# Patient Record
Sex: Male | Born: 1945 | Race: White | Hispanic: No | Marital: Married | Smoking: Never smoker
Health system: Southern US, Community
[De-identification: ages and names within clinical notes are randomized; demographics above are authoritative.]

## PROBLEM LIST (undated history)

## (undated) DIAGNOSIS — M1990 Unspecified osteoarthritis, unspecified site: Secondary | ICD-10-CM

## (undated) DIAGNOSIS — R339 Retention of urine, unspecified: Secondary | ICD-10-CM

## (undated) DIAGNOSIS — K8050 Calculus of bile duct without cholangitis or cholecystitis without obstruction: Secondary | ICD-10-CM

## (undated) DIAGNOSIS — R011 Cardiac murmur, unspecified: Secondary | ICD-10-CM

## (undated) DIAGNOSIS — G629 Polyneuropathy, unspecified: Secondary | ICD-10-CM

## (undated) DIAGNOSIS — K4090 Unilateral inguinal hernia, without obstruction or gangrene, not specified as recurrent: Secondary | ICD-10-CM

## (undated) DIAGNOSIS — I10 Essential (primary) hypertension: Secondary | ICD-10-CM

## (undated) DIAGNOSIS — M549 Dorsalgia, unspecified: Secondary | ICD-10-CM

## (undated) DIAGNOSIS — G8929 Other chronic pain: Secondary | ICD-10-CM

## (undated) DIAGNOSIS — M419 Scoliosis, unspecified: Secondary | ICD-10-CM

## (undated) DIAGNOSIS — Z9889 Other specified postprocedural states: Secondary | ICD-10-CM

## (undated) DIAGNOSIS — K819 Cholecystitis, unspecified: Secondary | ICD-10-CM

## (undated) HISTORY — DX: Calculus of bile duct without cholangitis or cholecystitis without obstruction: K80.50

## (undated) HISTORY — DX: Unilateral inguinal hernia, without obstruction or gangrene, not specified as recurrent: K40.90

## (undated) HISTORY — DX: Cholecystitis, unspecified: K81.9

## (undated) HISTORY — PX: FOOT SURGERY: SHX648

## (undated) HISTORY — PX: COLONOSCOPY: SHX174

## (undated) HISTORY — PX: TONSILLECTOMY: SUR1361

## (undated) HISTORY — DX: Cardiac murmur, unspecified: R01.1

---

## 1965-05-30 DIAGNOSIS — Z9089 Acquired absence of other organs: Secondary | ICD-10-CM

## 1965-05-30 DIAGNOSIS — E890 Postprocedural hypothyroidism: Secondary | ICD-10-CM

## 1965-05-30 HISTORY — PX: THYROIDECTOMY: SHX17

## 1965-05-30 HISTORY — DX: Acquired absence of other organs: Z90.89

## 1965-05-30 HISTORY — DX: Postprocedural hypothyroidism: E89.0

## 1973-05-30 HISTORY — PX: BACK SURGERY: SHX140

## 1998-05-27 ENCOUNTER — Encounter: Payer: Self-pay | Admitting: *Deleted

## 1998-05-27 ENCOUNTER — Ambulatory Visit (HOSPITAL_COMMUNITY): Admission: RE | Admit: 1998-05-27 | Discharge: 1998-05-27 | Payer: Self-pay | Admitting: *Deleted

## 1998-06-10 ENCOUNTER — Ambulatory Visit (HOSPITAL_COMMUNITY): Admission: RE | Admit: 1998-06-10 | Discharge: 1998-06-10 | Payer: Self-pay | Admitting: *Deleted

## 1998-06-24 ENCOUNTER — Ambulatory Visit (HOSPITAL_COMMUNITY): Admission: RE | Admit: 1998-06-24 | Discharge: 1998-06-24 | Payer: Self-pay | Admitting: *Deleted

## 1998-06-24 ENCOUNTER — Encounter: Payer: Self-pay | Admitting: *Deleted

## 1999-03-24 ENCOUNTER — Ambulatory Visit (HOSPITAL_BASED_OUTPATIENT_CLINIC_OR_DEPARTMENT_OTHER): Admission: RE | Admit: 1999-03-24 | Discharge: 1999-03-24 | Payer: Self-pay | Admitting: Orthopedic Surgery

## 2002-03-26 ENCOUNTER — Encounter: Admission: RE | Admit: 2002-03-26 | Discharge: 2002-03-26 | Payer: Self-pay | Admitting: Orthopedic Surgery

## 2002-03-26 ENCOUNTER — Encounter: Payer: Self-pay | Admitting: Orthopedic Surgery

## 2002-04-09 ENCOUNTER — Encounter: Payer: Self-pay | Admitting: Orthopedic Surgery

## 2002-04-09 ENCOUNTER — Encounter: Admission: RE | Admit: 2002-04-09 | Discharge: 2002-04-09 | Payer: Self-pay | Admitting: Orthopedic Surgery

## 2002-05-02 ENCOUNTER — Encounter: Payer: Self-pay | Admitting: Orthopedic Surgery

## 2002-05-02 ENCOUNTER — Encounter: Admission: RE | Admit: 2002-05-02 | Discharge: 2002-05-02 | Payer: Self-pay | Admitting: Orthopedic Surgery

## 2002-05-07 ENCOUNTER — Ambulatory Visit (HOSPITAL_COMMUNITY): Admission: RE | Admit: 2002-05-07 | Discharge: 2002-05-07 | Payer: Self-pay | Admitting: Gastroenterology

## 2003-05-31 HISTORY — PX: HAND SURGERY: SHX662

## 2003-09-18 ENCOUNTER — Ambulatory Visit (HOSPITAL_BASED_OUTPATIENT_CLINIC_OR_DEPARTMENT_OTHER): Admission: RE | Admit: 2003-09-18 | Discharge: 2003-09-18 | Payer: Self-pay | Admitting: Orthopedic Surgery

## 2003-09-18 ENCOUNTER — Ambulatory Visit (HOSPITAL_COMMUNITY): Admission: RE | Admit: 2003-09-18 | Discharge: 2003-09-18 | Payer: Self-pay | Admitting: Orthopedic Surgery

## 2004-12-16 ENCOUNTER — Ambulatory Visit (HOSPITAL_COMMUNITY): Admission: RE | Admit: 2004-12-16 | Discharge: 2004-12-16 | Payer: Self-pay | Admitting: Orthopedic Surgery

## 2011-03-08 DIAGNOSIS — E559 Vitamin D deficiency, unspecified: Secondary | ICD-10-CM | POA: Insufficient documentation

## 2011-05-15 ENCOUNTER — Emergency Department (HOSPITAL_COMMUNITY): Payer: Medicare Other

## 2011-05-15 ENCOUNTER — Encounter: Payer: Self-pay | Admitting: *Deleted

## 2011-05-15 ENCOUNTER — Other Ambulatory Visit: Payer: Self-pay

## 2011-05-15 ENCOUNTER — Inpatient Hospital Stay (HOSPITAL_COMMUNITY)
Admission: EM | Admit: 2011-05-15 | Discharge: 2011-05-17 | DRG: 917 | Disposition: A | Discharge status: Home / Self Care | Payer: Medicare Other | Attending: Internal Medicine | Admitting: Internal Medicine

## 2011-05-15 DIAGNOSIS — R4589 Other symptoms and signs involving emotional state: Secondary | ICD-10-CM

## 2011-05-15 DIAGNOSIS — Y92009 Unspecified place in unspecified non-institutional (private) residence as the place of occurrence of the external cause: Secondary | ICD-10-CM

## 2011-05-15 DIAGNOSIS — T50991A Poisoning by other drugs, medicaments and biological substances, accidental (unintentional), initial encounter: Principal | ICD-10-CM | POA: Diagnosis present

## 2011-05-15 DIAGNOSIS — R001 Bradycardia, unspecified: Secondary | ICD-10-CM

## 2011-05-15 DIAGNOSIS — M549 Dorsalgia, unspecified: Secondary | ICD-10-CM | POA: Diagnosis present

## 2011-05-15 DIAGNOSIS — T50901A Poisoning by unspecified drugs, medicaments and biological substances, accidental (unintentional), initial encounter: Secondary | ICD-10-CM

## 2011-05-15 DIAGNOSIS — I959 Hypotension, unspecified: Secondary | ICD-10-CM | POA: Diagnosis present

## 2011-05-15 DIAGNOSIS — G9340 Encephalopathy, unspecified: Secondary | ICD-10-CM

## 2011-05-15 DIAGNOSIS — I498 Other specified cardiac arrhythmias: Secondary | ICD-10-CM

## 2011-05-15 DIAGNOSIS — T424X1A Poisoning by benzodiazepines, accidental (unintentional), initial encounter: Secondary | ICD-10-CM

## 2011-05-15 DIAGNOSIS — F988 Other specified behavioral and emotional disorders with onset usually occurring in childhood and adolescence: Secondary | ICD-10-CM | POA: Diagnosis present

## 2011-05-15 DIAGNOSIS — G8929 Other chronic pain: Secondary | ICD-10-CM | POA: Diagnosis present

## 2011-05-15 DIAGNOSIS — T424X4A Poisoning by benzodiazepines, undetermined, initial encounter: Secondary | ICD-10-CM

## 2011-05-15 DIAGNOSIS — I10 Essential (primary) hypertension: Secondary | ICD-10-CM

## 2011-05-15 DIAGNOSIS — T50902A Poisoning by unspecified drugs, medicaments and biological substances, intentional self-harm, initial encounter: Secondary | ICD-10-CM

## 2011-05-15 HISTORY — DX: Other chronic pain: G89.29

## 2011-05-15 HISTORY — DX: Essential (primary) hypertension: I10

## 2011-05-15 HISTORY — DX: Scoliosis, unspecified: M41.9

## 2011-05-15 HISTORY — DX: Encephalopathy, unspecified: G93.40

## 2011-05-15 HISTORY — DX: Unspecified osteoarthritis, unspecified site: M19.90

## 2011-05-15 HISTORY — DX: Dorsalgia, unspecified: M54.9

## 2011-05-15 HISTORY — DX: Other specified postprocedural states: Z98.890

## 2011-05-15 LAB — ACETAMINOPHEN LEVEL: Acetaminophen (Tylenol), Serum: <15 ug/mL (ref 10–30)

## 2011-05-15 LAB — COMPREHENSIVE METABOLIC PANEL
AST: 14 U/L (ref 0–37)
AST: 14 U/L (ref 0–37)
Albumin: 3.1 g/dL — ABNORMAL LOW (ref 3.5–5.2)
BUN: 13 mg/dL (ref 6–23)
CO2: 28 mEq/L (ref 19–32)
Calcium: 9 mg/dL (ref 8.4–10.5)
Calcium: 8.2 mg/dL — ABNORMAL LOW (ref 8.4–10.5)
Chloride: 110 mEq/L (ref 96–112)
Creatinine, Ser: 1.06 mg/dL (ref 0.50–1.35)
Creatinine, Ser: 0.82 mg/dL (ref 0.50–1.35)
GFR calc non Af Amer: 72 mL/min — ABNORMAL LOW (ref >90)
Total Bilirubin: 0.3 mg/dL (ref 0.3–1.2)

## 2011-05-15 LAB — URINALYSIS, ROUTINE W REFLEX MICROSCOPIC
Bilirubin Urine: NEGATIVE
Glucose, UA: NEGATIVE mg/dL
Ketones, ur: NEGATIVE mg/dL
Protein, ur: NEGATIVE mg/dL
Urobilinogen, UA: 0.2 mg/dL (ref 0.0–1.0)

## 2011-05-15 LAB — BLOOD GAS, ARTERIAL
Acid-base deficit: 4.8 mmol/L — ABNORMAL HIGH (ref 0.0–2.0)
Drawn by: 30996
O2 Content: 2 L/min
O2 Saturation: 98.3 %
O2 Saturation: 93.8 %
Patient temperature: 98.6
TCO2: 20.2 mmol/L (ref 0–100)
pH, Arterial: 7.294 — ABNORMAL LOW (ref 7.350–7.450)
pO2, Arterial: 91.7 mmHg (ref 80.0–100.0)

## 2011-05-15 LAB — CBC
Hemoglobin: 12.9 g/dL — ABNORMAL LOW (ref 13.0–17.0)
MCH: 30.4 pg (ref 26.0–34.0)
MCH: 30.4 pg (ref 26.0–34.0)
MCV: 91.5 fL (ref 78.0–100.0)
Platelets: 212 10*3/uL (ref 150–400)
Platelets: 165 10*3/uL (ref 150–400)
RBC: 4.24 MIL/uL (ref 4.22–5.81)
RDW: 13.3 % (ref 11.5–15.5)
WBC: 7 10*3/uL (ref 4.0–10.5)
WBC: 7.1 10*3/uL (ref 4.0–10.5)

## 2011-05-15 LAB — RAPID URINE DRUG SCREEN, HOSP PERFORMED
Amphetamines: NOT DETECTED
Barbiturates: NOT DETECTED
Benzodiazepines: POSITIVE — AB
Cocaine: NOT DETECTED
Opiates: NOT DETECTED
Tetrahydrocannabinol: NOT DETECTED

## 2011-05-15 LAB — POCT I-STAT, CHEM 8
BUN: 13 mg/dL (ref 6–23)
Creatinine, Ser: 1.1 mg/dL (ref 0.50–1.35)
Glucose, Bld: 109 mg/dL — ABNORMAL HIGH (ref 70–99)
Sodium: 136 mEq/L (ref 135–145)
TCO2: 25 mmol/L (ref 0–100)

## 2011-05-15 LAB — URINE MICROSCOPIC-ADD ON

## 2011-05-15 LAB — AMYLASE: Amylase: 53 U/L (ref 0–105)

## 2011-05-15 LAB — APTT: aPTT: 32 seconds (ref 24–37)

## 2011-05-15 LAB — PROTIME-INR
INR: 1.06 (ref 0.00–1.49)
INR: 1.07 (ref 0.00–1.49)

## 2011-05-15 LAB — DIFFERENTIAL
Eosinophils Absolute: 0.1 10*3/uL (ref 0.0–0.7)
Lymphocytes Relative: 22 % (ref 12–46)
Lymphs Abs: 1.6 10*3/uL (ref 0.7–4.0)
Lymphs Abs: 1.7 10*3/uL (ref 0.7–4.0)
Monocytes Relative: 9 % (ref 3–12)
Monocytes Relative: 11 % (ref 3–12)
Neutro Abs: 4.7 10*3/uL (ref 1.7–7.7)
Neutro Abs: 4.5 10*3/uL (ref 1.7–7.7)
Neutrophils Relative %: 66 % (ref 43–77)
Neutrophils Relative %: 63 % (ref 43–77)

## 2011-05-15 LAB — LIPASE, BLOOD: Lipase: 35 U/L (ref 11–59)

## 2011-05-15 LAB — CARDIAC PANEL(CRET KIN+CKTOT+MB+TROPI)
CK, MB: 3.9 ng/mL (ref 0.3–4.0)
Total CK: 100 U/L (ref 7–232)

## 2011-05-15 LAB — PHOSPHORUS: Phosphorus: 3 mg/dL (ref 2.3–4.6)

## 2011-05-15 MED ORDER — HEPARIN SODIUM (PORCINE) 5000 UNIT/ML IJ SOLN
5000.0000 [IU] | Freq: Two times a day (BID) | INTRAMUSCULAR | Status: DC
  Administered 2011-05-15 – 2011-05-17 (×5): 5000 [IU] via SUBCUTANEOUS
  Filled 2011-05-15 (×8): qty 1

## 2011-05-15 MED ORDER — HALOPERIDOL LACTATE 5 MG/ML IJ SOLN
INTRAMUSCULAR | Status: AC
  Filled 2011-05-15: qty 1

## 2011-05-15 MED ORDER — GLUCAGON HCL (RDNA) 1 MG IJ SOLR
5.0000 mg | Freq: Once | INTRAMUSCULAR | Status: AC
  Administered 2011-05-15: 5 mg via INTRAVENOUS
  Filled 2011-05-15 (×3): qty 5
  Filled 2011-05-15: qty 1
  Filled 2011-05-15 (×2): qty 5

## 2011-05-15 MED ORDER — SODIUM CHLORIDE 0.9 % IV BOLUS (SEPSIS)
1000.0000 mL | Freq: Once | INTRAVENOUS | Status: AC
  Administered 2011-05-15: 1000 mL via INTRAVENOUS

## 2011-05-15 MED ORDER — NALOXONE HCL 1 MG/ML IJ SOLN
INTRAMUSCULAR | Status: AC
  Filled 2011-05-15: qty 2

## 2011-05-15 MED ORDER — SODIUM CHLORIDE 0.9 % IV SOLN
250.0000 mL | INTRAVENOUS | Status: DC | PRN
Start: 2011-05-15 — End: 2011-05-17

## 2011-05-15 MED ORDER — DOPAMINE-DEXTROSE 3.2-5 MG/ML-% IV SOLN
INTRAVENOUS | Status: AC
  Filled 2011-05-15: qty 250

## 2011-05-15 MED ORDER — GLUCAGON HCL (RDNA) 1 MG IJ SOLR
5.0000 mg | INTRAVENOUS | Status: DC
  Filled 2011-05-15 (×9): qty 5

## 2011-05-15 MED ORDER — DOPAMINE-DEXTROSE 3.2-5 MG/ML-% IV SOLN
2.0000 ug/kg/min | Freq: Once | INTRAVENOUS | Status: AC
  Administered 2011-05-15: 6 ug/kg/min via INTRAVENOUS

## 2011-05-15 MED ORDER — SODIUM CHLORIDE 0.9 % IV SOLN
INTRAVENOUS | Status: DC
  Administered 2011-05-15: 15:00:00 via INTRAVENOUS

## 2011-05-15 MED ORDER — ACETAMINOPHEN 500 MG PO TABS: 1000.0000 mg | ORAL_TABLET | Freq: Four times a day (QID) | ORAL | Status: DC | PRN

## 2011-05-15 MED ORDER — BIOTENE DRY MOUTH MT LIQD
15.0000 mL | Freq: Two times a day (BID) | OROMUCOSAL | Status: DC
  Administered 2011-05-15 – 2011-05-16 (×3): 15 mL via OROMUCOSAL

## 2011-05-15 MED ORDER — ONDANSETRON HCL 4 MG/2ML IJ SOLN
4.0000 mg | INTRAMUSCULAR | Status: DC | PRN
  Administered 2011-05-15: 4 mg via INTRAVENOUS
  Filled 2011-05-15: qty 2

## 2011-05-15 MED ORDER — SODIUM CHLORIDE 0.9 % IV SOLN
INTRAVENOUS | Status: DC
  Administered 2011-05-15: 11:00:00 via INTRAVENOUS
  Administered 2011-05-15: 100 mL/h via INTRAVENOUS
  Administered 2011-05-15: 11:00:00 via INTRAVENOUS

## 2011-05-15 MED ORDER — CHLORHEXIDINE GLUCONATE 0.12 % MT SOLN
15.0000 mL | Freq: Two times a day (BID) | OROMUCOSAL | Status: DC
  Administered 2011-05-15 – 2011-05-17 (×3): 15 mL via OROMUCOSAL
  Filled 2011-05-15 (×6): qty 15

## 2011-05-15 MED ORDER — HALOPERIDOL LACTATE 5 MG/ML IJ SOLN
5.0000 mg | Freq: Four times a day (QID) | INTRAMUSCULAR | Status: DC | PRN
  Administered 2011-05-15 – 2011-05-16 (×2): 5 mg via INTRAVENOUS
  Filled 2011-05-15: qty 1

## 2011-05-15 NOTE — ED Notes (Signed)
Toileting offered; pt tried but couldn't urinate; pt states he will try again later

## 2011-05-15 NOTE — Progress Notes (Signed)
Report given to Tiv[place] RN.  Pt calm an resting in bed.  Sitter at bedside.

## 2011-05-15 NOTE — Progress Notes (Signed)
Having mild agitation.  Haldol provided. Also D/C'd Foley and IVF

## 2011-05-15 NOTE — ED Notes (Signed)
Intensivist at bedside.

## 2011-05-15 NOTE — H&P (Addendum)
Patient name: Brian Ruiz Medical record number: [id number] Date of birth: [birth year] Age: 65 y.o. Gender: male PCP: No primary provider on file. Dr Margarette Canada   Date: 05/15/2011 Reason for Consult: Drug overdose  Referring Physician: Emergency room physician      Brief history Drug overdose Lines/tubes None Culture data/sepsis markers None  Antibiotics None Best practice Heparin DVT prophylaxis Protocols/consults  Events/studies Hypotension resolved in the emergency room at 3 L fluids  HPI  This is a 65 year old male with chronic pain and secondary disability but with few medical problems.     According to the emergency room physician who saw the patient around 10:30 AM today Per wife pt has had "stash" of unk and unk amt of meds "in a bathroom drawer" for unk period of time.Pt usually awakens during the night to "take some meds to go back to sleep" When she woke up this morning, pt sleeping soundly, and she assumed he took his meds per usual. Pt was more difficult to awaken than usual, told her he "took a lot of pills" She found metoprolol, xanax and zanaflex bottles were out. Called 911, EMS gave narcan en route without effect. Pt states he "took a bunch of my pills" approx 0730 this morning but could not say which or what amounts.    At the time of my evaluation at 1:30 PM. I learned from the patient that he taken around 6-9 tablets of 3 mg Xanax and 6-9  tablets Of muscle relaxant Zanaflex. He denies taking Lopressor overdose although when he came in the story was different, he was bradycardic and hypotensive and got glucagon pushes but both resolved after getting 3 L of fluid. At this point he is a little bit drowsy but easily arousable and conversant and he is feeling extremely guilty. He denies any suicidal or homicidal intentions. He admits to some crying spells at home but only more recently and feeling guilty because of today's event. He categorically states that he took  medications by mistake in order to relieve pain. Wife it is bedside also believes that the true.  At this point there is no fever, dyspnea, cough, sputum, edema, hemoptysis, nausea, vomiting, double vision, loss of consciousness arrhythmias. According to the emergency room physician Tylenol level and salicylate level were all normal.  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 He will only take Tylenol and muscle relaxants and Xanax.  Chest x-ray today suggested free air under the right hemidiaphragm but abdominal x-ray and clinical exam and history make this unlikely.  Past Medical History  Diagnosis Date  . Back pain, chronic     intolerant to narcotics, avoids NSAIDs due to vioxx related bleed,  sees pain clinic as [facility], prior eval by [facility] neuro in [place]  . Hypertension   . Arthritis   . Scoliosis   . 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 Thyroidectomy 1967    No family history on file.  Social History:  does not have a smoking history on file. He does not have any smokeless tobacco history on file. His alcohol and drug histories not on file. Nonsmoker and previous alcoholic but quit many years ago Allergies:  Allergies  Allergen Reactions  . Oxycodone  Agitation.    Medications:  Prior to Admission medications   Medication Sig Start Date End Date Taking? Authorizing Provider  acetaminophen (TYLENOL) 500 MG tablet Take 1,000 mg by mouth every 8 (eight) hours as needed. For pain.    Yes Historical Provider, MD  ALPRAZolam Prudy Feeler) 1 MG tablet Take 1 mg by mouth 3 (three) times daily.     Yes Historical Provider, MD  cholecalciferol (VITAMIN D) 400 UNITS TABS Take 400 Units by mouth daily.     Yes Historical Provider, MD  diazepam (VALIUM) 5  MG tablet Take 10 mg by mouth at bedtime as needed. For sleep.    Yes Historical Provider, MD  metoprolol tartrate (LOPRESSOR) 25 MG tablet Take 25 mg by mouth 2 (two) times daily.     Yes Historical Provider, MD  tiZANidine (ZANAFLEX) 4 MG tablet Take 4 mg by mouth 3 (three) times daily.     Yes Historical Provider, MD    Review of systems: Is as per history of present illness otherwise detail level part of his systems is negative  Pulse Rate:  [55-80] 70  (12/16 1245) Resp:  [12-20] 19  (12/16 1331) BP: (76-126)/(46-74) 118/74 mmHg (12/16 1331) SpO2:  [95 %-100 %] 99 % (12/16 1245) Weight:  [97.523 kg (215 lb)] 215 lb (97.523 kg) (12/16 1203)    Intake/Output Summary (Last 24 hours) at 05/15/11 1419 Last data filed at 05/15/11 1417  Gross per 24 hour  Intake      0 ml  Output   2000 ml  Net  -2000 ml   Physical exam BP 118/74  Pulse 70  Resp 19  Ht 6\' 3"  (1.905 m)  Wt 97.523 kg (215 lb)  BMI 26.87 kg/m2  SpO2 99%  General Appearance:   chronically unwell looking male lying in the stretcher in the emergency room . Looks dry   Head:    Normocephalic, without obvious abnormality, atraumatic  Eyes:    PERRL, conjunctiva/corneas clear, EOM's intact, fundi    benign, both eyes       Ears:    Normal TM's and external ear canals, both ears  Nose:   Nares normal, septum midline, mucosa normal, no drainage   or sinus tenderness  Throat:   Lips, mucosa, and tongue normal; teeth and gums normal. Scar of thyroidectomy present   Neck:   Supple, symmetrical, trachea midline, no adenopathy;       thyroid:  No enlargement/tenderness/nodules; no carotid   bruit or JVD  Back:     Symmetric, no curvature, ROM normal, no CVA tenderness  Lungs:     Clear to auscultation bilaterally, respirations unlabored  Chest wall:    No tenderness or deformity  Heart:    Regular rate and rhythm, S1 and S2 normal, no murmur, rub   or gallop  Abdomen:     Soft, non-tender, bowel sounds active all four  quadrants,    no masses, no organomegaly  Genitalia:    Normal male without lesion, discharge or tenderness  Rectal:    Normal tone, normal prostate, no masses or tenderness;   guaiac negative stool  Extremities:   Extremities normal, atraumatic, no cyanosis or edema  Pulses:   2+ and symmetric all extremities  Skin:   Skin color, texture, turgor normal, no rashes or lesions  Lymph nodes:   Cervical, supraclavicular, and axillary nodes normal  Neurologic:   CNII-XII intact. Normal strength, sensation and reflexes      Throughout. A little  bit drowsy but easily arousable and conversant. Projecting his airway. He appears sad       radiology   Dg Chest Port 1 View  05/15/2011  *RADIOLOGY REPORT*  Clinical Data: Rule out infiltrate.  Decreased heart rate. Hypertension.  Possible overdose.  PORTABLE CHEST - 1 VIEW  Comparison: None.  Findings: There is moderate S-shaped scoliosis of the thoracolumbar spine.  There is elevation of the right hemidiaphragm. There is right base atelectasis versus air beneath the right hemidiaphragm. Further evaluation with left lateral decubitus view of the abdomen is recommended.  Heart size is mildly enlarged.  There is perihilar opacity, raising question of mild pulmonary edema.  No definite focal consolidations or pleural effusions identified.  IMPRESSION:  1. 1.  Question of air in the right hemidiaphragm.  Further evaluation with left lateral decubitus view of the abdomen is recommended. 2.  Cardiomegaly and mild pulmonary edema.  Original Report Authenticated By: Patterson Hammersmith, M.D.   Dg Abd Portable 1v  05/15/2011  *RADIOLOGY REPORT*  Clinical Data: Follow-up after chest x-ray.  Evaluate for free intraperitoneal air.  ABDOMEN - 1 VIEW  Comparison: 05/15/2011  Findings: The patient is positioned on the left side to evaluate the possibility of free intraperitoneal air.  Streaky linear density is identified at the right lung base, consistent with  atelectasis.  There is no evidence for intraperitoneal air beneath the diaphragm.  Degenerative changes are seen in the spine.  IMPRESSION: No evidence for free intraperitoneal air.  Original Report Authenticated By: Patterson Hammersmith, M.D.    LAB RESULT Lab Results  Component Value Date   CREATININE 1.10 05/15/2011   BUN 13 05/15/2011   NA 136 05/15/2011   K 4.6 05/15/2011   CL 102 05/15/2011   CO2 28 05/15/2011   Lab Results  Component Value Date   WBC 7.0 05/15/2011   HGB 13.3 05/15/2011   HCT 39.0 05/15/2011   MCV 91.3 05/15/2011   PLT 212 05/15/2011   Lab Results  Component Value Date   ALT 13 05/15/2011   AST 14 05/15/2011   ALKPHOS 39 05/15/2011   BILITOT 0.3 05/15/2011   Lab Results  Component Value Date   INR 1.06 05/15/2011    ABG    Component Value Date/Time   PHART 7.276* 05/15/2011 1227   PCO2ART 48.1* 05/15/2011 1227   PO2ART 91.7 05/15/2011 1227   HCO3 21.7 05/15/2011 1227   TCO2 19.9 05/15/2011 1227   ACIDBASEDEF 4.8* 05/15/2011 1227   O2SAT 98.3 05/15/2011 1227   Tylenol, And SAl Level normal  Assessment and Plan  Principal Problem:  *Accidental drug overdose Active Problems:  Hypotension  Bradycardia  Encephalopathy acute  Guilty feelings  Hypertension  Chronic back pain s/p Thyroidectomy  He excellently to Xanax and muscle relaxant Zanaflex. He might have taken Lopressor as well. Currently dealing with guilt issues. He was hypotensive and bradycardic but it responded to 3 L of IV fluids. On clinical exam he looks a little bit dehydrated. ABG a few hours ago consistent with benzodiazepine overdose with migratory acidosis but currently respiratory status is stable and improved.  Nevertheless he is critically ill and would require a stepped-down bed.  Admit to step down unit Currently supportive care with plenty of IV fluids and recheck labs. Monitor closely for hypotension and respiratory distress and sepsis Once he is improved we'll  need to address pain and possibly depression issues and guilt CHECK THYROID FUNCNTION 12/17  D/w Dr Virginia Rochester - [facility]  will assume primary service on AM on 05/16/11.    The patient is critically ill with multiple organ systems failure and requires high complexity decision making for assessment and support, frequent evaluation and titration of therapies, application of advanced monitoring technologies and extensive interpretation of multiple databases. Critical Care Time devoted to patient care services described in this note is 60  minutes.     Kassaundra Hair 05/15/2011, 2:19 PM

## 2011-05-15 NOTE — ED Notes (Addendum)
EMS reports pt took a combination of Xanax and muscle relaxer due to pain. Approx 20 pills all together. Pt crying and talking on scene, lethargic upon arrival, IV 18 Left FA, CBG 105, fluids given enroute. Pt has history of chronic back pain. EMS administered 2 mg Narcan

## 2011-05-15 NOTE — ED Notes (Signed)
[id number]<BJ> Expected date:05/15/11<BR> Expected time:10:14 AM<BR> Means of arrival:Ambulance<BR> Comments:<BR> EMS Overdose

## 2011-05-15 NOTE — ED Notes (Signed)
BP 126/74  HR 88.  Dr Clarene Duke advised patient BP increasing. Dopamine and Glucagon held

## 2011-05-15 NOTE — ED Provider Notes (Signed)
History     CSN: [id number] Arrival date & time: 05/15/2011 10:19 AM   Chief Complaint  Patient presents with  . Drug Overdose   Patient is a 65 y.o. male presenting with Overdose. The history is provided by the patient, the EMS personnel and the spouse. The history is limited by the condition of the patient.  Drug Overdose  Pt was seen at 1025.   Per EMS and wife, pt with OD of unknown pills this morning.  Pt's wife states pt has had "a stash" of unknown pills in unknown amounts at home "in a bathroom drawer" for an unknown period of time.  Per pt's wife, pt usually awakens during the night to "take some meds to go back to sleep."  When she woke up this morning, pt was sleeping soundly, and she assumed he took his meds per his usual routine.  Pt's wife became concerned when she noted that pt was more difficult to awaken than usual and then told her he "took a lot of pills."  Pt's wife states she found pt's metoprolol, xanax and zanaflex bottles out.  EMS gave narcan en route without effect.  Pt himself states he "took a bunch of my pills," "maybe like 20" at approx 0730 this morning.  Pt cannot tell me which pills he took or it what amounts.  Pt also states that he "doesn't take my medicines the way I should" and "I take 2 when I should take 1, I take 4 when I should take 3."  Pt's wife states pt has hx of depression and chronic pain.    Past Medical History  Diagnosis Date  . Back pain, chronic   . Hypertension   . Arthritis     No past surgical history on file.   History  Substance Use Topics  . Smoking status: Not on file  . Smokeless tobacco: Not on file  . Alcohol Use:     Review of Systems  Unable to perform ROS: Other    Allergies  Oxycodone  Home Medications   Current Outpatient Rx  Name Route Sig Dispense Refill  . ACETAMINOPHEN 500 MG PO TABS Oral Take 1,000 mg by mouth every 8 (eight) hours as needed. For pain.     Marland Kitchen ALPRAZOLAM 1 MG PO TABS Oral Take 1 mg by  mouth 3 (three) times daily.      . CHOLECALCIFEROL 400 UNITS PO TABS Oral Take 400 Units by mouth daily.      Marland Kitchen DIAZEPAM 5 MG PO TABS Oral Take 10 mg by mouth at bedtime as needed. For sleep.     Marland Kitchen METOPROLOL TARTRATE 25 MG PO TABS Oral Take 25 mg by mouth 2 (two) times daily.      Marland Kitchen TIZANIDINE HCL 4 MG PO TABS Oral Take 4 mg by mouth 3 (three) times daily.        BP 105/65  Pulse 60  Resp 16  Ht 6\' 3"  (1.905 m)  Wt 215 lb (97.523 kg)  BMI 26.87 kg/m2  SpO2 95%  Physical Exam 1030: Physical examination:  Nursing notes reviewed; Vital signs and O2 SAT reviewed;  Constitutional: Thin, In no acute distress; Head:  Normocephalic, atraumatic; Eyes: EOMI, PERRL, No scleral icterus; ENMT: Mouth and pharynx normal, Mucous membranes dry; Neck: Supple, Full range of motion, No lymphadenopathy; Cardiovascular: Bradycardic, rate 40's. Regular rhythm. No murmur, rub, or gallop; Respiratory: Breath sounds clear & equal bilaterally, No rales, rhonchi, wheezes, or rub, Normal respiratory effort/excursion;  Chest: Nontender, Movement normal; Abdomen: Soft, Nontender, Nondistended, Normal bowel sounds; Extremities: Pulses normal, No tenderness, No edema, No calf edema or asymmetry.; Neuro: Lethargic, but awakens to name, speech slurred, moves all ext on stretcher without apparent gross focal motor deficits.  No facial droop; Skin: Color normal, Warm, Dry, no rash.    ED Course  Procedures   1030:  Pt bradycardic HR 40's and hypotensive with SBP 70's.  Pills counted (xanax, zanaflex, metoprolol); counts do not match the doses on the bottles (ie: metoprolol should have 120+ pills missing if pt is taking it as rx, but only 50+ are missing from bottle).  Pt apparently not taking any of his pills regularly.  Will continue IVF NS boluses, give IV glucagon bolus for possible b-blocker OD.  Pt holding his own airway at this time, swallowing secretions, occas talking with wife at bedside, resps without distress, Sats  99% on O2 2L N/C; will continue to monitor at this time.    1100:  IV glucagon given with good effect:  HR now 60's, SBP 100's.  Will hold dopamine for now, start glucagon gtt.  Pt's wife updated re: plan of care.  1215:  T/C to PCCM Dr. Tyson Alias, case discussed, including:  HPI, pertinent PM/SHx, VS/PE, dx testing, ED course and treatment.  Agreeable to admit.  Requests to obtain ABG.  Pt currently awake, but groggy, talking with wife and ED staff, speech slurred.  SBP continues 100-110's, HR 60's, Sats 99% on O2 2L N/C.   1315:  ABG with pH 7.28, CO2 48, O2 92.  Appears resp acidosis.  Pt continues groggy, but talks with staff and wife at bedside.  Resps without distress.  Will continue to monitor.  SBP continues 100-110's, HR 60's, Sats 99% O2 2L N/C.  Will hold glucagon gtt for now.    MDM  MDM Reviewed: nursing note, vitals and previous chart Interpretation: ECG, labs and x-ray Total time providing critical care: 75-105 minutes. This excludes time spent performing separately reportable procedures and services. Consults: critical care   CRITICAL CARE Performed by: Laray Anger Total critical care time: 90 Critical care time was exclusive of separately billable procedures and treating other patients. Critical care was necessary to treat or prevent imminent or life-threatening deterioration. Critical care was time spent personally by me on the following activities: development of treatment plan with patient and/or surrogate as well as nursing, discussions with consultants, evaluation of patient's response to treatment, examination of patient, obtaining history from patient or surrogate, ordering and performing treatments and interventions, ordering and review of laboratory studies, ordering and review of radiographic studies, pulse oximetry and re-evaluation of patient's condition.   Date: 05/15/2011  Rate: 54  Rhythm: sinus bradycardia  QRS Axis: normal  Intervals: normal  ST/T  Wave abnormalities: normal  Conduction Disutrbances:none  Narrative Interpretation:   Old EKG Reviewed: none available.   Results for orders placed during the hospital encounter of 05/15/11  COMPREHENSIVE METABOLIC PANEL      Component Value Range   Sodium 134 (*) 135 - 145 (mEq/L)   Potassium 4.6  3.5 - 5.1 (mEq/L)   Chloride 102  96 - 112 (mEq/L)   CO2 28  19 - 32 (mEq/L)   Glucose, Bld 109 (*) 70 - 99 (mg/dL)   BUN 13  6 - 23 (mg/dL)   Creatinine, Ser 1.61  0.50 - 1.35 (mg/dL)   Calcium 9.0  8.4 - 09.6 (mg/dL)   Total Protein 5.7 (*) 6.0 - 8.3 (  g/dL)   Albumin 3.3 (*) 3.5 - 5.2 (g/dL)   AST 14  0 - 37 (U/L)   ALT 13  0 - 53 (U/L)   Alkaline Phosphatase 39  39 - 117 (U/L)   Total Bilirubin 0.3  0.3 - 1.2 (mg/dL)   GFR calc non Af Amer 72 (*) >90 (mL/min)   GFR calc Af Amer 83 (*) >90 (mL/min)  CBC      Component Value Range   WBC 7.0  4.0 - 10.5 (K/uL)   RBC 4.24  4.22 - 5.81 (MIL/uL)   Hemoglobin 12.9 (*) 13.0 - 17.0 (g/dL)   HCT 57.8 (*) 46.9 - 52.0 (%)   MCV 91.3  78.0 - 100.0 (fL)   MCH 30.4  26.0 - 34.0 (pg)   MCHC 33.3  30.0 - 36.0 (g/dL)   RDW 62.9  52.8 - 41.3 (%)   Platelets 212  150 - 400 (K/uL)  DIFFERENTIAL      Component Value Range   Neutrophils Relative 66  43 - 77 (%)   Neutro Abs 4.7  1.7 - 7.7 (K/uL)   Lymphocytes Relative 22  12 - 46 (%)   Lymphs Abs 1.6  0.7 - 4.0 (K/uL)   Monocytes Relative 9  3 - 12 (%)   Monocytes Absolute 0.7  0.1 - 1.0 (K/uL)   Eosinophils Relative 2  0 - 5 (%)   Eosinophils Absolute 0.1  0.0 - 0.7 (K/uL)   Basophils Relative 1  0 - 1 (%)   Basophils Absolute 0.1  0.0 - 0.1 (K/uL)  URINE RAPID DRUG SCREEN (HOSP PERFORMED)      Component Value Range   Opiates NONE DETECTED  NONE DETECTED    Cocaine NONE DETECTED  NONE DETECTED    Benzodiazepines POSITIVE (*) NONE DETECTED    Amphetamines NONE DETECTED  NONE DETECTED    Tetrahydrocannabinol NONE DETECTED  NONE DETECTED    Barbiturates NONE DETECTED  NONE DETECTED     ETHANOL      Component Value Range   Alcohol, Ethyl (B) <11  0 - 11 (mg/dL)  URINALYSIS, ROUTINE W REFLEX MICROSCOPIC      Component Value Range   Color, Urine YELLOW  YELLOW    APPearance CLEAR  CLEAR    Specific Gravity, Urine 1.008  1.005 - 1.030    pH 7.0  5.0 - 8.0    Glucose, UA NEGATIVE  NEGATIVE (mg/dL)   Hgb urine dipstick LARGE (*) NEGATIVE    Bilirubin Urine NEGATIVE  NEGATIVE    Ketones, ur NEGATIVE  NEGATIVE (mg/dL)   Protein, ur NEGATIVE  NEGATIVE (mg/dL)   Urobilinogen, UA 0.2  0.0 - 1.0 (mg/dL)   Nitrite NEGATIVE  NEGATIVE    Leukocytes, UA TRACE (*) NEGATIVE   PROTIME-INR      Component Value Range   Prothrombin Time 14.0  11.6 - 15.2 (seconds)   INR 1.06  0.00 - 1.49   ACETAMINOPHEN LEVEL      Component Value Range   Acetaminophen (Tylenol), Serum <15.0  10 - 30 (ug/mL)  APTT      Component Value Range   aPTT 33  24 - 37 (seconds)  SALICYLATE LEVEL      Component Value Range   Salicylate Lvl <2.0 (*) 2.8 - 20.0 (mg/dL)  POCT I-STAT, CHEM 8      Component Value Range   Sodium 136  135 - 145 (mEq/L)   Potassium 4.6  3.5 - 5.1 (mEq/L)  Chloride 102  96 - 112 (mEq/L)   BUN 13  6 - 23 (mg/dL)   Creatinine, Ser 1.61  0.50 - 1.35 (mg/dL)   Glucose, Bld 096 (*) 70 - 99 (mg/dL)   Calcium, Ion 0.45  4.09 - 1.32 (mmol/L)   TCO2 25  0 - 100 (mmol/L)   Hemoglobin 13.3  13.0 - 17.0 (g/dL)   HCT 81.1  91.4 - 78.2 (%)  URINE MICROSCOPIC-ADD ON      Component Value Range   Squamous Epithelial / LPF RARE  RARE    WBC, UA 0-2  <3 (WBC/hpf)   RBC / HPF TOO NUMEROUS TO COUNT  <3 (RBC/hpf)   Bacteria, UA RARE  RARE    Dg Chest Port 1 View  05/15/2011  *RADIOLOGY REPORT*  Clinical Data: Rule out infiltrate.  Decreased heart rate. Hypertension.  Possible overdose.  PORTABLE CHEST - 1 VIEW  Comparison: None.  Findings: There is moderate S-shaped scoliosis of the thoracolumbar spine.  There is elevation of the right hemidiaphragm. There is right base atelectasis versus  air beneath the right hemidiaphragm. Further evaluation with left lateral decubitus view of the abdomen is recommended.  Heart size is mildly enlarged.  There is perihilar opacity, raising question of mild pulmonary edema.  No definite focal consolidations or pleural effusions identified.  IMPRESSION:  1. 1.  Question of air in the right hemidiaphragm.  Further evaluation with left lateral decubitus view of the abdomen is recommended. 2.  Cardiomegaly and mild pulmonary edema.  Original Report Authenticated By: Patterson Hammersmith, M.D.   Dg Abd Portable 1v  05/15/2011  *RADIOLOGY REPORT*  Clinical Data: Follow-up after chest x-ray.  Evaluate for free intraperitoneal air.  ABDOMEN - 1 VIEW  Comparison: 05/15/2011  Findings: The patient is positioned on the left side to evaluate the possibility of free intraperitoneal air.  Streaky linear density is identified at the right lung base, consistent with atelectasis.  There is no evidence for intraperitoneal air beneath the diaphragm.  Degenerative changes are seen in the spine.  IMPRESSION: No evidence for free intraperitoneal air.  Original Report Authenticated By: Patterson Hammersmith, M.D.        Rosezetta Schlatter, DO 05/16/11 2120

## 2011-05-16 ENCOUNTER — Encounter (HOSPITAL_COMMUNITY): Payer: Self-pay | Admitting: *Deleted

## 2011-05-16 DIAGNOSIS — F988 Other specified behavioral and emotional disorders with onset usually occurring in childhood and adolescence: Secondary | ICD-10-CM

## 2011-05-16 DIAGNOSIS — T50991A Poisoning by other drugs, medicaments and biological substances, accidental (unintentional), initial encounter: Principal | ICD-10-CM

## 2011-05-16 LAB — CARDIAC PANEL(CRET KIN+CKTOT+MB+TROPI)
CK, MB: 4.4 ng/mL — ABNORMAL HIGH (ref 0.3–4.0)
Relative Index: 3.1 — ABNORMAL HIGH (ref 0.0–2.5)
Relative Index: 1.7 (ref 0.0–2.5)
Total CK: 257 U/L — ABNORMAL HIGH (ref 7–232)
Troponin I: <0.3 ng/mL (ref <0.30)
Troponin I: <0.3 ng/mL (ref <0.30)

## 2011-05-16 LAB — MAGNESIUM: Magnesium: 1.8 mg/dL (ref 1.5–2.5)

## 2011-05-16 LAB — CBC
MCHC: 33.4 g/dL (ref 30.0–36.0)
MCV: 90.3 fL (ref 78.0–100.0)
Platelets: 199 10*3/uL (ref 150–400)
RDW: 13.1 % (ref 11.5–15.5)
WBC: 9.8 10*3/uL (ref 4.0–10.5)

## 2011-05-16 LAB — BASIC METABOLIC PANEL
Chloride: 106 mEq/L (ref 96–112)
Creatinine, Ser: 0.84 mg/dL (ref 0.50–1.35)
GFR calc Af Amer: >90 mL/min (ref >90)
GFR calc non Af Amer: 90 mL/min — ABNORMAL LOW (ref >90)

## 2011-05-16 LAB — T4, FREE: Free T4: 1.11 ng/dL (ref 0.80–1.80)

## 2011-05-16 MED ORDER — ARIPIPRAZOLE 2 MG PO TABS
2.0000 mg | ORAL_TABLET | Freq: Two times a day (BID) | ORAL | Status: DC
  Administered 2011-05-16 – 2011-05-17 (×3): 2 mg via ORAL
  Filled 2011-05-16 (×5): qty 1

## 2011-05-16 MED ORDER — METOPROLOL TARTRATE 12.5 MG HALF TABLET
12.5000 mg | ORAL_TABLET | Freq: Two times a day (BID) | ORAL | Status: DC
  Administered 2011-05-16 – 2011-05-17 (×2): 12.5 mg via ORAL
  Filled 2011-05-16 (×5): qty 1

## 2011-05-16 NOTE — Progress Notes (Signed)
Notified MD Cleotis Lema about patient's CK, MB and CK Total results.

## 2011-05-16 NOTE — Progress Notes (Addendum)
Met with Pt and wife, Brian Ruiz.  Pt has significant psychiatric outpt hx.  Pt was followed by Dr. 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  He has been on numerous medications, including Klonopin, Lithium, Adderall, Ritalin, Tranzene.  Pt has no inpt hospitalizations.  Pt suffers from chronic back pain and has had an increase in back pain in the past 5-6 years.  Pt has been out on disability for the past 2 years for his back pain and anxiety.  Pt has been experiencing anxiety, depression due to not working.  He has been to see his MD on Wed who have him some Risperdal.  This kept him up all night.  He notified his MD who told him to then take it in the a.m on Thurs.  He states that he went "manic" and Pt reports that he decided to give his wife all of his meds over the weekend out of disgust, as none of them were working.  Pt notified MD on Friday that he still hadn't slept.  MD gave him Valium and indicated that he could no longer assist Pt, as Pt's needs were out of his scope of care.  Pt stated that he awoke Sunday morning in pain and was desperate for relief.  He found a travel container of meds and took all of them in an attempt to ease the pain.  Pt stated that he was "frustrated and tired of hurting" and felt that if he took the meds he'd get a "good sleep", as Pt was having significant sleep pxs. Pt is adamant that this was not a suicide attempt.  He denies SI, HI, AVH, paranoia, delusions currently or by hx.  Pt is a recovered alcoholic.  His last drink was in his 50's.  He used to drink approx 3-10 drinks daily.  Pt quit smoking cigarettes in his 20's.  No legal hx  Pt currently lives with his wife of almost 46 years.  Pt has 2 adult children who live out of the home  Spoke with Pt's wife, Brian Ruiz, at bedside, who was present for CSW's interview with Pt.  Brian Ruiz stated that she doesn't feel that this was a suicide attempt.  Rather, Brian Ruiz stated that she feels  that Pt was desperate for sleep and pain relief and that he took the meds in an attempt to get some physical and emotional relief.  Brian Ruiz, LCSWA Clinical Social Work [phone]

## 2011-05-16 NOTE — Progress Notes (Addendum)
Subjective:  Patient seen and examined ,stated that he was unable to sleep at night because the bed was uncomfortable ,other than that denies any complaints. Objective: Vital signs in last 24 hours: Temp:  [97.4 F (36.3 C)-99.8 F (37.7 C)] 99.8 F (37.7 C) (12/17 0400) Pulse Rate:  [55-82] 63  (12/16 2300) Resp:  [12-22] 17  (12/16 2300) BP: (76-171)/(46-86) 104/57 mmHg (12/17 0400) SpO2:  [95 %-100 %] 98 % (12/16 2300) Weight:  [96.8 kg (213 lb 6.5 oz)-97.523 kg (215 lb)] 213 lb 6.5 oz (96.8 kg) (12/17 0500) Weight change:  Last BM Date:  (prior to admission )  Intake/Output from previous day: 12/16 0701 - 12/17 0700 In: 5180 [I.V.:5180] Out: 4400 [Urine:4400]     Physical Exam: General: Alert, awake, oriented x3, in no acute distress.Anxious  Heart: Irregular rate and rhythm, without murmurs, rubs, gallops. Lungs: Clear to auscultation bilaterally. Abdomen: Soft, nontender, nondistended, positive bowel sounds. Extremities: No clubbing cyanosis or edema with positive pedal pulses. Neuro: Grossly intact, nonfocal.    Lab Results: Results for orders placed during the hospital encounter of 05/15/11 (from the past 24 hour(s))  COMPREHENSIVE METABOLIC PANEL     Status: Abnormal   Collection Time   05/15/11 10:30 AM      Component Value Range   Sodium 134 (*) 135 - 145 (mEq/L)   Potassium 4.6  3.5 - 5.1 (mEq/L)   Chloride 102  96 - 112 (mEq/L)   CO2 28  19 - 32 (mEq/L)   Glucose, Bld 109 (*) 70 - 99 (mg/dL)   BUN 13  6 - 23 (mg/dL)   Creatinine, Ser 8.11  0.50 - 1.35 (mg/dL)   Calcium 9.0  8.4 - 91.4 (mg/dL)   Total Protein 5.7 (*) 6.0 - 8.3 (g/dL)   Albumin 3.3 (*) 3.5 - 5.2 (g/dL)   AST 14  0 - 37 (U/L)   ALT 13  0 - 53 (U/L)   Alkaline Phosphatase 39  39 - 117 (U/L)   Total Bilirubin 0.3  0.3 - 1.2 (mg/dL)   GFR calc non Af Amer 72 (*) >90 (mL/min)   GFR calc Af Amer 83 (*) >90 (mL/min)  CBC     Status: Abnormal   Collection Time   05/15/11 10:30 AM   Component Value Range   WBC 7.0  4.0 - 10.5 (K/uL)   RBC 4.24  4.22 - 5.81 (MIL/uL)   Hemoglobin 12.9 (*) 13.0 - 17.0 (g/dL)   HCT 78.2 (*) 95.6 - 52.0 (%)   MCV 91.3  78.0 - 100.0 (fL)   MCH 30.4  26.0 - 34.0 (pg)   MCHC 33.3  30.0 - 36.0 (g/dL)   RDW 21.3  08.6 - 57.8 (%)   Platelets 212  150 - 400 (K/uL)  DIFFERENTIAL     Status: Normal   Collection Time   05/15/11 10:30 AM      Component Value Range   Neutrophils Relative 66  43 - 77 (%)   Neutro Abs 4.7  1.7 - 7.7 (K/uL)   Lymphocytes Relative 22  12 - 46 (%)   Lymphs Abs 1.6  0.7 - 4.0 (K/uL)   Monocytes Relative 9  3 - 12 (%)   Monocytes Absolute 0.7  0.1 - 1.0 (K/uL)   Eosinophils Relative 2  0 - 5 (%)   Eosinophils Absolute 0.1  0.0 - 0.7 (K/uL)   Basophils Relative 1  0 - 1 (%)   Basophils Absolute 0.1  0.0 -  0.1 (K/uL)  ETHANOL     Status: Normal   Collection Time   05/15/11 10:30 AM      Component Value Range   Alcohol, Ethyl (B) <11  0 - 11 (mg/dL)  PROTIME-INR     Status: Normal   Collection Time   05/15/11 10:30 AM      Component Value Range   Prothrombin Time 14.0  11.6 - 15.2 (seconds)   INR 1.06  0.00 - 1.49   ACETAMINOPHEN LEVEL     Status: Normal   Collection Time   05/15/11 10:30 AM      Component Value Range   Acetaminophen (Tylenol), Serum <15.0  10 - 30 (ug/mL)  APTT     Status: Normal   Collection Time   05/15/11 10:30 AM      Component Value Range   aPTT 33  24 - 37 (seconds)  SALICYLATE LEVEL     Status: Abnormal   Collection Time   05/15/11 10:30 AM      Component Value Range   Salicylate Lvl <2.0 (*) 2.8 - 20.0 (mg/dL)  POCT I-STAT, CHEM 8     Status: Abnormal   Collection Time   05/15/11 11:02 AM      Component Value Range   Sodium 136  135 - 145 (mEq/L)   Potassium 4.6  3.5 - 5.1 (mEq/L)   Chloride 102  96 - 112 (mEq/L)   BUN 13  6 - 23 (mg/dL)   Creatinine, Ser 4.54  0.50 - 1.35 (mg/dL)   Glucose, Bld 098 (*) 70 - 99 (mg/dL)   Calcium, Ion 1.19  1.47 - 1.32 (mmol/L)   TCO2  25  0 - 100 (mmol/L)   Hemoglobin 13.3  13.0 - 17.0 (g/dL)   HCT 82.9  56.2 - 13.0 (%)  URINE RAPID DRUG SCREEN (HOSP PERFORMED)     Status: Abnormal   Collection Time   05/15/11 11:51 AM      Component Value Range   Opiates NONE DETECTED  NONE DETECTED    Cocaine NONE DETECTED  NONE DETECTED    Benzodiazepines POSITIVE (*) NONE DETECTED    Amphetamines NONE DETECTED  NONE DETECTED    Tetrahydrocannabinol NONE DETECTED  NONE DETECTED    Barbiturates NONE DETECTED  NONE DETECTED   URINALYSIS, ROUTINE W REFLEX MICROSCOPIC     Status: Abnormal   Collection Time   05/15/11 11:51 AM      Component Value Range   Color, Urine YELLOW  YELLOW    APPearance CLEAR  CLEAR    Specific Gravity, Urine 1.008  1.005 - 1.030    pH 7.0  5.0 - 8.0    Glucose, UA NEGATIVE  NEGATIVE (mg/dL)   Hgb urine dipstick LARGE (*) NEGATIVE    Bilirubin Urine NEGATIVE  NEGATIVE    Ketones, ur NEGATIVE  NEGATIVE (mg/dL)   Protein, ur NEGATIVE  NEGATIVE (mg/dL)   Urobilinogen, UA 0.2  0.0 - 1.0 (mg/dL)   Nitrite NEGATIVE  NEGATIVE    Leukocytes, UA TRACE (*) NEGATIVE   URINE MICROSCOPIC-ADD ON     Status: Normal   Collection Time   05/15/11 11:51 AM      Component Value Range   Squamous Epithelial / LPF RARE  RARE    WBC, UA 0-2  <3 (WBC/hpf)   RBC / HPF TOO NUMEROUS TO COUNT  <3 (RBC/hpf)   Bacteria, UA RARE  RARE   BLOOD GAS, ARTERIAL     Status: Abnormal  Collection Time   05/15/11 12:27 PM      Component Value Range   O2 Content 2.0     Delivery systems NASAL CANNULA     pH, Arterial 7.276 (*) 7.350 - 7.450    pCO2 arterial 48.1 (*) 35.0 - 45.0 (mmHg)   pO2, Arterial 91.7  80.0 - 100.0 (mmHg)   Bicarbonate 21.7  20.0 - 24.0 (mEq/L)   TCO2 19.9  0 - 100 (mmol/L)   Acid-base deficit 4.8 (*) 0.0 - 2.0 (mmol/L)   O2 Saturation 98.3     Patient temperature 98.6     Collection site LEFT BRACHIAL     Drawn by [phone]     Sample type ARTERIAL DRAW    LACTIC ACID, PLASMA     Status: Normal   Collection  Time   05/15/11  2:10 PM      Component Value Range   Lactic Acid, Venous 1.3  0.5 - 2.2 (mmol/L)  BLOOD GAS, ARTERIAL     Status: Abnormal   Collection Time   05/15/11  2:10 PM      Component Value Range   O2 Content 2.0     Delivery systems NASAL CANNULA     pH, Arterial 7.294 (*) 7.350 - 7.450    pCO2 arterial 46.6 (*) 35.0 - 45.0 (mmHg)   pO2, Arterial 75.3 (*) 80.0 - 100.0 (mmHg)   Bicarbonate 21.9  20.0 - 24.0 (mEq/L)   TCO2 20.2  0 - 100 (mmol/L)   Acid-base deficit 4.2 (*) 0.0 - 2.0 (mmol/L)   O2 Saturation 93.8     Patient temperature 98.6     Collection site LEFT BRACHIAL     Drawn by [id number]     Sample type ARTERIAL DRAW    CORTISOL     Status: Normal   Collection Time   05/15/11  2:40 PM      Component Value Range   Cortisol, Plasma 6.5    CBC     Status: Abnormal   Collection Time   05/15/11  2:50 PM      Component Value Range   WBC 7.1  4.0 - 10.5 (K/uL)   RBC 4.24  4.22 - 5.81 (MIL/uL)   Hemoglobin 12.9 (*) 13.0 - 17.0 (g/dL)   HCT 09.8 (*) 11.9 - 52.0 (%)   MCV 91.5  78.0 - 100.0 (fL)   MCH 30.4  26.0 - 34.0 (pg)   MCHC 33.2  30.0 - 36.0 (g/dL)   RDW 14.7  82.9 - 56.2 (%)   Platelets 165  150 - 400 (K/uL)  COMPREHENSIVE METABOLIC PANEL     Status: Abnormal   Collection Time   05/15/11  2:50 PM      Component Value Range   Sodium 138  135 - 145 (mEq/L)   Potassium 4.6  3.5 - 5.1 (mEq/L)   Chloride 110  96 - 112 (mEq/L)   CO2 25  19 - 32 (mEq/L)   Glucose, Bld 94  70 - 99 (mg/dL)   BUN 12  6 - 23 (mg/dL)   Creatinine, Ser 1.30  0.50 - 1.35 (mg/dL)   Calcium 8.2 (*) 8.4 - 10.5 (mg/dL)   Total Protein 5.6 (*) 6.0 - 8.3 (g/dL)   Albumin 3.1 (*) 3.5 - 5.2 (g/dL)   AST 14  0 - 37 (U/L)   ALT 13  0 - 53 (U/L)   Alkaline Phosphatase 39  39 - 117 (U/L)   Total Bilirubin 0.2 (*) 0.3 -  1.2 (mg/dL)   GFR calc non Af Amer >90  >90 (mL/min)   GFR calc Af Amer >90  >90 (mL/min)  MAGNESIUM     Status: Normal   Collection Time   05/15/11  2:50 PM       Component Value Range   Magnesium 2.0  1.5 - 2.5 (mg/dL)  PHOSPHORUS     Status: Normal   Collection Time   05/15/11  2:50 PM      Component Value Range   Phosphorus 3.0  2.3 - 4.6 (mg/dL)  AMYLASE     Status: Normal   Collection Time   05/15/11  2:50 PM      Component Value Range   Amylase 53  0 - 105 (U/L)  LIPASE, BLOOD     Status: Normal   Collection Time   05/15/11  2:50 PM      Component Value Range   Lipase 35  11 - 59 (U/L)  PROCALCITONIN     Status: Normal   Collection Time   05/15/11  2:50 PM      Component Value Range   Procalcitonin <0.10    PRO B NATRIURETIC PEPTIDE     Status: Normal   Collection Time   05/15/11  2:50 PM      Component Value Range   Pro B Natriuretic peptide (BNP) 37.0  0 - 125 (pg/mL)  DIFFERENTIAL     Status: Normal   Collection Time   05/15/11  2:50 PM      Component Value Range   Neutrophils Relative 63  43 - 77 (%)   Neutro Abs 4.5  1.7 - 7.7 (K/uL)   Lymphocytes Relative 24  12 - 46 (%)   Lymphs Abs 1.7  0.7 - 4.0 (K/uL)   Monocytes Relative 11  3 - 12 (%)   Monocytes Absolute 0.8  0.1 - 1.0 (K/uL)   Eosinophils Relative 2  0 - 5 (%)   Eosinophils Absolute 0.1  0.0 - 0.7 (K/uL)   Basophils Relative 0  0 - 1 (%)   Basophils Absolute 0.0  0.0 - 0.1 (K/uL)  PROTIME-INR     Status: Normal   Collection Time   05/15/11  2:50 PM      Component Value Range   Prothrombin Time 14.1  11.6 - 15.2 (seconds)   INR 1.07  0.00 - 1.49   APTT     Status: Normal   Collection Time   05/15/11  2:50 PM      Component Value Range   aPTT 32  24 - 37 (seconds)  CARDIAC PANEL(CRET KIN+CKTOT+MB+TROPI)     Status: Abnormal   Collection Time   05/15/11  6:15 PM      Component Value Range   Total CK 100  7 - 232 (U/L)   CK, MB 3.9  0.3 - 4.0 (ng/mL)   Troponin I <0.30  <0.30 (ng/mL)   Relative Index 3.9 (*) 0.0 - 2.5   MRSA PCR SCREENING     Status: Normal   Collection Time   05/15/11  6:41 PM      Component Value Range   MRSA by PCR NEGATIVE   NEGATIVE   CARDIAC PANEL(CRET KIN+CKTOT+MB+TROPI)     Status: Abnormal   Collection Time   05/16/11  2:46 AM      Component Value Range   Total CK 109  7 - 232 (U/L)   CK, MB 3.4  0.3 - 4.0 (ng/mL)   Troponin  I <0.30  <0.30 (ng/mL)   Relative Index 3.1 (*) 0.0 - 2.5   CBC     Status: Abnormal   Collection Time   05/16/11  2:47 AM      Component Value Range   WBC 9.8  4.0 - 10.5 (K/uL)   RBC 4.21 (*) 4.22 - 5.81 (MIL/uL)   Hemoglobin 12.7 (*) 13.0 - 17.0 (g/dL)   HCT 40.9 (*) 81.1 - 52.0 (%)   MCV 90.3  78.0 - 100.0 (fL)   MCH 30.2  26.0 - 34.0 (pg)   MCHC 33.4  30.0 - 36.0 (g/dL)   RDW 91.4  78.2 - 95.6 (%)   Platelets 199  150 - 400 (K/uL)  BASIC METABOLIC PANEL     Status: Abnormal   Collection Time   05/16/11  2:47 AM      Component Value Range   Sodium 137  135 - 145 (mEq/L)   Potassium 3.7  3.5 - 5.1 (mEq/L)   Chloride 106  96 - 112 (mEq/L)   CO2 25  19 - 32 (mEq/L)   Glucose, Bld 91  70 - 99 (mg/dL)   BUN 8  6 - 23 (mg/dL)   Creatinine, Ser 2.13  0.50 - 1.35 (mg/dL)   Calcium 8.7  8.4 - 08.6 (mg/dL)   GFR calc non Af Amer 90 (*) >90 (mL/min)   GFR calc Af Amer >90  >90 (mL/min)  MAGNESIUM     Status: Normal   Collection Time   05/16/11  2:47 AM      Component Value Range   Magnesium 1.8  1.5 - 2.5 (mg/dL)  PHOSPHORUS     Status: Normal   Collection Time   05/16/11  2:47 AM      Component Value Range   Phosphorus 3.6  2.3 - 4.6 (mg/dL)    Studies/Results: Dg Chest Port 1 View  05/15/2011  *RADIOLOGY REPORT*  Clinical Data: Rule out infiltrate.  Decreased heart rate. Hypertension.  Possible overdose.  PORTABLE CHEST - 1 VIEW  Comparison: None.  Findings: There is moderate S-shaped scoliosis of the thoracolumbar spine.  There is elevation of the right hemidiaphragm. There is right base atelectasis versus air beneath the right hemidiaphragm. Further evaluation with left lateral decubitus view of the abdomen is recommended.  Heart size is mildly enlarged.  There is  perihilar opacity, raising question of mild pulmonary edema.  No definite focal consolidations or pleural effusions identified.  IMPRESSION:  1. 1.  Question of air in the right hemidiaphragm.  Further evaluation with left lateral decubitus view of the abdomen is recommended. 2.  Cardiomegaly and mild pulmonary edema.  Original Report Authenticated By: Patterson Hammersmith, M.D.   Dg Abd Portable 1v  05/15/2011  *RADIOLOGY REPORT*  Clinical Data: Follow-up after chest x-ray.  Evaluate for free intraperitoneal air.  ABDOMEN - 1 VIEW  Comparison: 05/15/2011  Findings: The patient is positioned on the left side to evaluate the possibility of free intraperitoneal air.  Streaky linear density is identified at the right lung base, consistent with atelectasis.  There is no evidence for intraperitoneal air beneath the diaphragm.  Degenerative changes are seen in the spine.  IMPRESSION: No evidence for free intraperitoneal air.  Original Report Authenticated By: Patterson Hammersmith, M.D.    Medications:    . antiseptic oral rinse  15 mL Mouth Rinse q12n4p  . chlorhexidine  15 mL Mouth Rinse BID  . DOPamine  2-20 mcg/kg/min Intravenous Once  . glucagon  5 mg Intravenous Once  . haloperidol lactate      . heparin  5,000 Units Subcutaneous Q12H  . naloxone      . sodium chloride  1,000 mL Intravenous Once  . sodium chloride  1,000 mL Intravenous Once  . sodium chloride  1,000 mL Intravenous Once  . DISCONTD: DOPamine        sodium chloride, acetaminophen, haloperidol lactate, ondansetron     . DISCONTD: sodium chloride 100 mL/hr (05/15/11 1827)  . DISCONTD: sodium chloride 200 mL/hr at 05/15/11 1518  . DISCONTD: glucagon (GLUCAGEN) 5 MG IV      Assessment/Plan:  Principal Problem:  *Accidental drug overdose Active Problems:  Hypertension  Hypotension  Bradycardia  Encephalopathy acute  Chronic back pain  Plan: Vitals stabilized ,however HR still irregular ,will transfer to telemetry  bed Patient denied suicidal attempt or ideation ,however admitted that he had history of bipolar dx,depression,anxiety ,ADHD .Will consult psychiatry ,continue safety sitter     LOS: 1 day   Zriyah Kopplin 05/16/2011, 7:35 AM ADDENDUM: I was informed by RN that patient is tachycardiac HR 144 ,sinus ,as per patient he was taking metoprolol at home ,bradycardia documented in his chart ,review of vitals HR 55-144 ,will resume metoprolol at lower dose 12.5 mg bid with parameters to hold for HR<80

## 2011-05-16 NOTE — Consult Note (Signed)
Patient Identification:  Brian Ruiz Date of Evaluation:  05/16/2011   History of Present Illness: This is a 65 year old male with chronic pain and secondary disability but with few medical problems.  According to the emergency room physician who saw the patient around 10:30 AM today Per wife pt has had "stash" of unk and unk amt of meds "in a bathroom drawer" for unk period of time.Pt usually awakens during the night to "take some meds to go back to sleep" When she woke up this morning, pt sleeping soundly, and she assumed he took his meds per usual. Pt was more difficult to awaken than usual, told her he "took a lot of pills" She found metoprolol, xanax and zanaflex bottles were out. Called 911, EMS gave narcan en route without effect. Pt states he "took a bunch of my pills" approx 0730 this morning but could not say which or what amounts.  At the time of my evaluation at 1:30 PM. I learned from the patient that he taken around 6-9 tablets of 3 mg Xanax and 6-9 tablets   Of muscle relaxant Zanaflex. He denies taking Lopressor overdose although when he came in the story was different, he was bradycardic and hypotensive and got glucagon pushes but both resolved after getting 3 L of fluid. At this point he is a little bit drowsy but easily arousable and conversant and he is feeling extremely guilty. He denies any suicidal or homicidal intentions. He admits to some crying spells at home but only more recently and feeling guilty because of today's event. He categorically states that he took medications by mistake in order to relieve pain. Wife it is bedside also believes that the true.  As per CSW notes   Pt has significant psychiatric outpt hx. Pt was followed by Dr. 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 He has been on numerous medications, including Klonopin, Lithium, Adderall, Ritalin, Tranzene. Pt has no inpt hospitalizations.  Pt suffers from chronic back  pain and has had an increase in back pain in the past 5-6 years. Pt has been out on disability for the past 2 years for his back pain and anxiety. Pt has been experiencing anxiety, depression due to not working.   He has been to see his MD on Wed who have him some Risperdal. This kept him up all night. He notified his MD who told him to then take it in the a.m on Thurs. He states that he went "manic" and Pt reports that he decided to give his wife all of his meds over the weekend out of disgust, as none of them were working. Pt notified MD on Friday that he still hadn't slept. MD gave him Valium and indicated that he could no longer assist Pt, as Pt's needs were out of his scope of care. Pt stated that he awoke Sunday morning in pain and was desperate for relief. He found a travel container of meds and took all of them in an attempt to ease the pain. Pt stated that he was "frustrated and tired of hurting" and felt that if he took the meds he'd get a "good sleep", as Pt was having significant sleep pxs. Pt is adamant that this was not a suicide attempt. He denies SI, HI, AVH, paranoia, delusions currently or by hx.   Pt is a recovered alcoholic. His last drink was in his 50's. He used to drink approx 3-10 drinks daily. Pt quit smoking cigarettes in his 20's.  No legal hx   Pt currently lives with his wife of almost 46 years. Pt has 2 adult children who live out of the home  Spoke with Pt's wife, Brian Ruiz, at bedside, who was present for CSW's interview with Pt. Brian Ruiz stated that she doesn't feel that this was a suicide attempt. Rather, Brian Ruiz stated that she feels that Pt was desperate for sleep and pain relief and that he took the meds in an attempt to get some physical and emotional relief.   Mental Status Examination: Patient is very calm cooperative to the interview pleasant on approach. He reported history of ADHD for a long period of time. He also told me about his pain issues and insomnia. I discussed  with him about number of medication patient agreed to be started on Abilify. Patient is not hallucinating or delusional he is not suicidal or homicidal. He is alert awake oriented x3 memory immediate recent remote fair attention concentration fair abstract ability fair insight and judgment intact.   Past Medical History:     Past Medical History  Diagnosis Date  . Back pain, chronic     intolerant to narcotics, avoids NSAIDs due to vioxx related bleed,  sees pain clinic as [facility], prior eval by [facility] neuro in [place]  . Hypertension   . Arthritis   . Scoliosis   . 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 Thyroidectomy 1967    Filed Vitals:   05/16/11 1139  BP:   Pulse:   Temp: 97.6 F (36.4 C)  Resp:     Lab Results:   BMET    Component Value Date/Time   NA 137 05/16/2011 0247   K 3.7 05/16/2011 0247   CL 106 05/16/2011 0247   CO2 25 05/16/2011 0247   GLUCOSE 91 05/16/2011 0247   BUN 8 05/16/2011 0247   CREATININE 0.84 05/16/2011 0247   CALCIUM 8.7 05/16/2011 0247   GFRNONAA 90* 05/16/2011 0247   GFRAA >90 05/16/2011 0247    Allergies:  Allergies  Allergen Reactions  . Oxycodone     Agitation.    Current Medications:  Prior to Admission medications   Medication Sig Start Date End Date Taking? Authorizing Provider  acetaminophen (TYLENOL) 500 MG tablet Take 1,000 mg by mouth every 8 (eight) hours as needed. For pain.    Yes Historical Provider, MD  ALPRAZolam Prudy Feeler) 1 MG tablet Take 1 mg by mouth 3 (three) times daily.     Yes Historical Provider, MD  cholecalciferol (VITAMIN D) 400 UNITS TABS Take 400 Units by mouth daily.     Yes Historical Provider, MD  diazepam (VALIUM) 5 MG tablet Take 10 mg by mouth at bedtime as needed. For sleep.    Yes Historical Provider, MD  metoprolol tartrate (LOPRESSOR) 25 MG tablet Take 25 mg by mouth 2 (two) times daily.     Yes Historical Provider, MD  tiZANidine (ZANAFLEX) 4 MG tablet Take 4 mg  by mouth 3 (three) times daily.     Yes Historical Provider, MD    Social History:    does not have a smoking history on file. He does not have any smokeless tobacco history on file. His alcohol and drug histories not on file.   Family History:    No family history on file.   DIAGNOSIS:   AXIS I  ADD, adjustment disorder   AXIS II  Deffered  AXIS III See medical notes.  AXIS  IV  medical issues   AXIS V 55     Recommendations: Patient can be followed in the outpatient setting and the social worker will work with the patient to make an appointment. Patient started on Abilify 2 mg twice a day Sitter discontinued at this time.    Eulogio Ditch, MD

## 2011-05-17 ENCOUNTER — Other Ambulatory Visit: Payer: Self-pay

## 2011-05-17 LAB — URINE CULTURE
Colony Count: NO GROWTH
Culture  Setup Time: 201212161940
Culture: NO GROWTH

## 2011-05-17 LAB — CARDIAC PANEL(CRET KIN+CKTOT+MB+TROPI)
Relative Index: 0.8 (ref 0.0–2.5)
Total CK: 872 U/L — ABNORMAL HIGH (ref 7–232)

## 2011-05-17 MED ORDER — LORAZEPAM 2 MG/ML IJ SOLN
1.0000 mg | Freq: Two times a day (BID) | INTRAMUSCULAR | Status: DC | PRN
  Administered 2011-05-17: 02:00:00 via INTRAMUSCULAR

## 2011-05-17 MED ORDER — ARIPIPRAZOLE 2 MG PO TABS: 2.0000 mg | ORAL_TABLET | Freq: Two times a day (BID) | ORAL | Status: AC

## 2011-05-17 MED ORDER — LORAZEPAM 2 MG/ML IJ SOLN
INTRAMUSCULAR | Status: AC
  Filled 2011-05-17: qty 1

## 2011-05-17 MED ORDER — ZOLPIDEM TARTRATE 5 MG PO TABS
5.0000 mg | ORAL_TABLET | Freq: Every evening | ORAL | Status: DC | PRN
  Administered 2011-05-17: 5 mg via ORAL
  Filled 2011-05-17: qty 1

## 2011-05-17 NOTE — Discharge Summary (Signed)
Patient ID: Brian Ruiz MRN: [id number] DOB/AGE: 1946-03-15 65 y.o.  Admit date: 05/15/2011 Discharge date: 05/17/2011  Primary Care Physician:  No primary provider on file.   Discharge Diagnoses:    Present on Admission:  *accidental drug overdose   hypotension Bradycardia Acute encephalopathy Chronic back pain Elevated CK level    Current Discharge Medication List    START taking these medications   Details  ARIPiprazole (ABILIFY) 2 MG tablet Take 1 tablet (2 mg total) by mouth 2 (two) times daily. Qty: 60 tablet, Refills: 0      CONTINUE these medications which have NOT CHANGED   Details  acetaminophen (TYLENOL) 500 MG tablet Take 1,000 mg by mouth every 8 (eight) hours as needed. For pain.     cholecalciferol (VITAMIN D) 400 UNITS TABS Take 400 Units by mouth daily.      metoprolol tartrate (LOPRESSOR) 25 MG tablet Take 25 mg by mouth 2 (two) times daily.        STOP taking these medications     ALPRAZolam (XANAX) 1 MG tablet      diazepam (VALIUM) 5 MG tablet      tiZANidine (ZANAFLEX) 4 MG tablet          Consults: PCCM Psychiatry   Significant Diagnostic Studies:  Dg Chest Port 1 View  05/15/2011  *RADIOLOGY REPORT*  Clinical Data: Rule out infiltrate.  Decreased heart rate. Hypertension.  Possible overdose.  PORTABLE CHEST - 1 VIEW  Comparison: None.  Findings: There is moderate S-shaped scoliosis of the thoracolumbar spine.  There is elevation of the right hemidiaphragm. There is right base atelectasis versus air beneath the right hemidiaphragm. Further evaluation with left lateral decubitus view of the abdomen is recommended.  Heart size is mildly enlarged.  There is perihilar opacity, raising question of mild pulmonary edema.  No definite focal consolidations or pleural effusions identified.  IMPRESSION:  1. 1.  Question of air in the right hemidiaphragm.  Further evaluation with left lateral decubitus view of the abdomen is recommended. 2.   Cardiomegaly and mild pulmonary edema.  Original Report Authenticated By: Patterson Hammersmith, M.D.   Dg Abd Portable 1v  05/15/2011  *RADIOLOGY REPORT*  Clinical Data: Follow-up after chest x-ray.  Evaluate for free intraperitoneal air.  ABDOMEN - 1 VIEW  Comparison: 05/15/2011  Findings: The patient is positioned on the left side to evaluate the possibility of free intraperitoneal air.  Streaky linear density is identified at the right lung base, consistent with atelectasis.  There is no evidence for intraperitoneal air beneath the diaphragm.  Degenerative changes are seen in the spine.  IMPRESSION: No evidence for free intraperitoneal air.  Original Report Authenticated By: Patterson Hammersmith, M.D.    Brief H and P: For complete details please refer to admission H and P, but in brief  This is a 65 year old male with chronic pain and secondary disability but with few medical problems.   Per wife pt has had "stash" of unk and unk amt of meds "in a bathroom drawer" for unk period of time.Pt usually awakens during the night to "take some meds to go back to sleep" When she woke up this morning, pt sleeping soundly, and she assumed he took his meds per usual. Pt was more difficult to awaken than usual, told her he "took a lot of pills" She found metoprolol, xanax and zanaflex bottles were out. Called 911, EMS gave narcan en route without effect. Pt states he "took a bunch of  my pills" approx 0730 this morning but could not say which or what amounts.  At the time of my evaluation at 1:30 PM. I learned from the patient that he taken around 6-9 tablets of 3 mg Xanax and 6-9 tablets Of muscle relaxant Zanaflex. He denies taking Lopressor overdose although when he came in the story was different, he was bradycardic and hypotensive and got glucagon pushes but both resolved after getting 3 L of fluid. At this point he is a little bit drowsy but easily arousable and conversant and he is feeling extremely guilty. He  denies any suicidal or homicidal intentions. He admits to some crying spells at home but only more recently and feeling guilty because of today's event. He categorically states that he took medications by mistake in order to relieve pain. Wife it is bedside also believes that the true.  At this point there is no fever, dyspnea, cough, sputum, edema, hemoptysis, nausea, vomiting, double vision, loss of consciousness arrhythmias. AccordingHlate level were all normal. He has a history of chronic back pain and difficult to treat pain complex because he will not go to pain clinic or take narcotics due to intolerance and we'll avoid nonsteroidal anti-inflammatory drugs due to prior history of GI bleed from Vioxx. He will only take Tylenol and muscle relaxants and Xanax.      Hospital Course:   [name] with IV fluids, hypotension and bradycardia both resolved. He was initially monitored in step  down unit and managed by PCCM. After he was stabilized and transferred to  telemetry . There was no arrhythmias noted on his telemetry recordings. Patient was seen in is consultation by psychiatry service and started on Abilify and found to be non suicidal and his overdose felt  to be accidental  . Patient was noted to be tachycardic however Was in sinus rhythm, his metoprolol was resumed and his tachycardia was controlled, I suspect tachycardia was secondary to anxiety. Labs showed  her on an elevated  CK and CK-MB with normal troponin, patient has no chest pain  and his EKG is unremarkable , the patient was hydrated with normal saline however repeat CK level was more than 800 and was 250 yesterday, etiology is unclear patient stated that he has been exercising lately and he was pulled to be placed on the stretcher when EMS arrived. I advised the patient to drink plenty of fluids  and to follow with his PCP in 1-2 days for repeat CK level.I offered the patient to keep him in the hospital for IV fluid and  monitoring of his CK level however he declined and was ready to be discharged.  On discharge we will discontinue Xanax, and the muscle relaxer, patient was alerted with symptoms and signs of withdrawal and advised to follow with his PCP.   Subjective Patient seen and examined, denies any complaints, eager to go home, wife at bedside Filed Vitals:   05/17/11 0600  BP: 134/90  Pulse: 78  Temp: 98.3 F (36.8 C)  Resp: 18    General: Alert, awake, oriented x3, in no acute distress. pleasant Heart: Regular rate and rhythm, without murmurs, rubs, gallops.  Lungs: Clear to auscultation bilaterally.  Abdomen: Soft, nontender, nondistended, positive bowel sounds.  Extremities: No clubbing cyanosis or edema with positive pedal pulses.  Neuro: Grossly intact, nonfocal.    Disposition and Follow-up: To home with his wife follow with PCP ASAP follow with psychiatry as advised    Time spent on Discharge: 50  minutes    Signed: Brazos Sandoval 05/17/2011, 3:34 PM

## 2011-05-17 NOTE — Progress Notes (Signed)
Patient discharged home with wife, alert and oriented, discharge orders given, patient verbalize understanding of discharge orders, patient in stable condition at this time

## 2011-05-17 NOTE — Progress Notes (Signed)
Met with Pt and wife.  Pt reports feeling better today.  He expressed interest in outpt med mgmt, as well at therapy.  Contacted several community providers and documented their first available appt dates, as well as their M'care rates.  Provided Pt and wife with this information.   CSW offered to make the appointment for husband.  Wife declined, stating that she will have to coordinate this appointment with Pt's other appts.  Pt and wife thanked CSW for time and assistance.  Providence Crosby, LCSWA Clinical Social Work [phone]

## 2011-05-27 DIAGNOSIS — G609 Hereditary and idiopathic neuropathy, unspecified: Secondary | ICD-10-CM | POA: Insufficient documentation

## 2011-07-07 DIAGNOSIS — M4317 Spondylolisthesis, lumbosacral region: Secondary | ICD-10-CM | POA: Insufficient documentation

## 2011-07-07 DIAGNOSIS — G894 Chronic pain syndrome: Secondary | ICD-10-CM | POA: Insufficient documentation

## 2011-07-07 DIAGNOSIS — M25551 Pain in right hip: Secondary | ICD-10-CM | POA: Insufficient documentation

## 2011-07-07 DIAGNOSIS — M25552 Pain in left hip: Secondary | ICD-10-CM | POA: Insufficient documentation

## 2011-07-27 DIAGNOSIS — M533 Sacrococcygeal disorders, not elsewhere classified: Secondary | ICD-10-CM | POA: Insufficient documentation

## 2011-08-11 DIAGNOSIS — M461 Sacroiliitis, not elsewhere classified: Secondary | ICD-10-CM | POA: Insufficient documentation

## 2011-12-16 DIAGNOSIS — N23 Unspecified renal colic: Secondary | ICD-10-CM | POA: Insufficient documentation

## 2012-03-14 DIAGNOSIS — F411 Generalized anxiety disorder: Secondary | ICD-10-CM | POA: Insufficient documentation

## 2012-03-16 DIAGNOSIS — M5137 Other intervertebral disc degeneration, lumbosacral region: Secondary | ICD-10-CM | POA: Insufficient documentation

## 2012-03-16 DIAGNOSIS — M5417 Radiculopathy, lumbosacral region: Secondary | ICD-10-CM | POA: Insufficient documentation

## 2012-04-03 ENCOUNTER — Encounter (INDEPENDENT_AMBULATORY_CARE_PROVIDER_SITE_OTHER): Payer: Self-pay | Admitting: Surgery

## 2012-04-03 ENCOUNTER — Ambulatory Visit (INDEPENDENT_AMBULATORY_CARE_PROVIDER_SITE_OTHER): Payer: Medicare Other | Admitting: Surgery

## 2012-04-03 VITALS — BP 144/88 | HR 76 | Temp 97.6°F | Resp 16 | Ht 75.0 in | Wt 195.2 lb

## 2012-04-03 DIAGNOSIS — K4090 Unilateral inguinal hernia, without obstruction or gangrene, not specified as recurrent: Secondary | ICD-10-CM | POA: Insufficient documentation

## 2012-04-03 NOTE — Patient Instructions (Signed)
We will schedule surgery to repair your left inguinal hernia. Let us know if the urologist wants [place] to postpone the surgery after you have seen him in a couple of weeks.

## 2012-04-03 NOTE — Progress Notes (Signed)
NAME: Brian Ruiz DOB: 1946-01-19 MRN: [id number]                                                                                      DATE: 04/03/2012  PCP: Aura Dials, MD Referring Provider: Aura Dials, MD  IMPRESSION:  Reducible left inguinal hernia Significant degenerative disease of the back Distended urinary bladder noted on recent MRI scan, uncertain significance, urological consultation pending  PLAN:   I recommended that he have elective repair of his left inguinal hernia after his GU issues have been evaluated and resolved. He wishes to have surgery because he is uncomfortable. I have discussed with the patient the fact that he has an inguinal hernia and reviewed the pathophysiology of that diagnosis. I have given him educational materials about this. I discussed options for treatment including observation or repair and have discussed both laparoscopic and open inguinal hernia repairs as potential techniques. We have discussed the use of mesh. We have discussed risks of surgery including bleeding, infection, recurrence, postoperative pain and possible chronic groin pain, testicular injury, and potential issues with voiding. I believe the patient's questions have been answered and that he has a good understanding of the issues                  CC:  Chief Complaint  Patient presents with  . Inguinal Hernia    left    HPI:  Brian Ruiz is a 66 y.o.  male who presents for evaluation of Left inguinal hernia. 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 Since then he has noted an intermittent bulge which he can reduce by pressing on it. It is somewhat soft and squishy. It is a little bit uncomfortable persistently. He has not noticed any problems on the right side. He's never had an inguinal hernia diagnosed previously. This occurred about a month ago when he first had symptoms.  PMH:  has a past medical  history of Back pain, chronic; Hypertension; Arthritis; Scoliosis; S/P thyroidectomy (1967); Heart murmur; and Inguinal hernia.  PSH:   has past surgical history that includes Thyroidectomy (1967); Back surgery (1975); Foot surgery (1985, 2000); and Hand surgery (2005).  ALLERGIES:   Allergies  Allergen Reactions  . Oxycodone Other (See Comments)    Agitation. Per the any pain medication that has codeine in it causes that reaction.    MEDICATIONS: Current outpatient prescriptions:acetaminophen (TYLENOL) 500 MG tablet, Take 1,000 mg by mouth every 8 (eight) hours as needed. For pain. , Disp: , Rfl: ;  cholecalciferol (VITAMIN D) 400 UNITS TABS, Take 400 Units by mouth daily.  , Disp: , Rfl: ;  metoprolol tartrate (LOPRESSOR) 25 MG tablet, Take 25 mg by mouth 2 (two) times daily.  , Disp: , Rfl:   ROS: He has filled out our 12 point review of systems and it is negative . He does note significant back issues and recently had a cardiac evaluation and apparently has a "leaky" aortic valve but not significant. He was told that his echocardiogram showed good cardiac function.Marland Kitchen EXAM:   VITAL  SIGNS: BP 144/88  Pulse 76  Temp 97.6 F (36.4 C) (Temporal)  Resp 16  Ht 6\' 3"  (1.905 m)  Wt 195 lb 4 oz (88.565 kg)  BMI 24.40 kg/m2  GENERAL:  The patient is alert, oriented, and generally healthy-appearing, NAD. Mood and affect are normal.  HEENT:  The head is normocephalic, the eyes nonicteric, the pupils were round regular and equal. EOMs are normal. Pharynx normal. Dentition good.  NECK:  The neck is supple and there are no masses or thyromegaly.  LUNGS:  Normal respirations and clear to auscultation.  HEART:  Regular rhythm, with no murmurs rubs or gallops. Pulses are intact carotid dorsalis pedis and posterior tibial. No significant varicosities are noted.  ABDOMEN:  Soft, flat, and nontender. No masses or organomegaly is noted. No hernias are noted. Bowel sounds are normal.  GU:    Normal male with normal testes. There is no right inguinal hernia present. There is a reducible left inguinal hernia which I think is direct.  EXTREMITIES:  Good range of motion, no edema.   DATA REVIEWED:  I have reviewed the MRI report and lab reports from his primary care office. Of note is that he had a large bladder as well as degenerative disc disease.    Brian Ruiz 04/03/2012  CC: Aura Dials, MD, Aura Dials, MD

## 2012-04-23 ENCOUNTER — Encounter (HOSPITAL_BASED_OUTPATIENT_CLINIC_OR_DEPARTMENT_OTHER): Payer: Self-pay | Admitting: *Deleted

## 2012-04-23 ENCOUNTER — Telehealth (INDEPENDENT_AMBULATORY_CARE_PROVIDER_SITE_OTHER): Payer: Self-pay | Admitting: General Surgery

## 2012-04-23 NOTE — Telephone Encounter (Signed)
Spoke with patient. He is having increased pain in [facility] but he is still able to reduce this. He will let us know if he can not. He is also having a new pain under his umbilicus. He has no point tenderness but feels a burning sensation in the area. He has never had abdominal surgery before so I let him know it would be a strange place to develop a hernia under the umbilicus and could be referred pain from [facility] but I would let Dr Jamey Ripa know about it. He also states he had his catheter out on Friday from Dr Madilyn Hook office and they are supposed to be sending a fax today letting us know he is okay from urological standpoint. I told him I would call them in the morning if I do not see a clearance come through today. He will call with any additional problems.

## 2012-04-23 NOTE — Progress Notes (Signed)
Pt has been a chronic back pain pt for many yrs Was put on zanaflex and xanax last fall-taking too many-ended up er 12/12 with accidental OD-was taken off those meds-allergic to NASID-takes tylenol. Recent sofr murmur-saw dr h Katrinka Blazing for echo-called for notes. Recent urinary retention from pain and possibly lumbar back issues-had to be cathed-put on rapaflo-voiding well not-takes at night-suggested for him to ck with dr Brunilda Payor about taking an extra rapaflo pm of surgery if not voided 4-6 hr post op or uncomfortable. To come in for bmet

## 2012-04-23 NOTE — Telephone Encounter (Signed)
Message copied by Liliana Cline on Mon Apr 23, 2012  3:28 PM ------      Message from: Marchia Bond      Created: Mon Apr 23, 2012  8:44 AM      Regarding: pt has quesations      Contact: [phone]       Pt is having surgery on Monday with Dr. Jamey Ripa and has some questions about it. He wants to make sure Dr. Julien Girt office sent over clearance for his surgery. And he is having some new pain by his belly button. And he also had some questions about meds. He said if he does not answer the phone just to leave a message. Call him back at [phone]

## 2012-04-24 NOTE — Telephone Encounter (Signed)
Spoke with Brian Ruiz at [facility] who will get message to Brian Ruiz, Dr Geraldine Contras RN to send note stating patient okay for hernia repair from urological standpoint.

## 2012-04-25 ENCOUNTER — Telehealth (INDEPENDENT_AMBULATORY_CARE_PROVIDER_SITE_OTHER): Payer: Self-pay | Admitting: General Surgery

## 2012-04-25 ENCOUNTER — Encounter (INDEPENDENT_AMBULATORY_CARE_PROVIDER_SITE_OTHER): Payer: Self-pay

## 2012-04-25 NOTE — Telephone Encounter (Signed)
Clearance from Dr Brunilda Payor received and scanned to [facility] under procedures.

## 2012-04-25 NOTE — Telephone Encounter (Signed)
Per patient he spoke with Dr Brunilda Payor and was cleared from urology for hernia repair.

## 2012-04-25 NOTE — Telephone Encounter (Signed)
Pt called to report he has fever of 102F today.  He is scheduled for IHR on Monday.  No other symptoms at this time; this is his third day of fever, but it has been lower.  Advised him to see his PCP----PCP is closed all week.  Advised bedrest, push po fluids AMAP and take Tylenol only.  (No ASA or NSAIDs pre op.)  He will monitor his temperature, understands the need to push fluids and rest and will call to cancel surgery early Monday if he is still running any fever.

## 2012-04-30 ENCOUNTER — Encounter (HOSPITAL_BASED_OUTPATIENT_CLINIC_OR_DEPARTMENT_OTHER): Payer: Self-pay | Admitting: *Deleted

## 2012-04-30 ENCOUNTER — Encounter (HOSPITAL_BASED_OUTPATIENT_CLINIC_OR_DEPARTMENT_OTHER): Payer: Self-pay | Admitting: Certified Registered Nurse Anesthetist

## 2012-04-30 ENCOUNTER — Ambulatory Visit (HOSPITAL_BASED_OUTPATIENT_CLINIC_OR_DEPARTMENT_OTHER)
Admission: RE | Admit: 2012-04-30 | Discharge: 2012-04-30 | Disposition: A | Discharge status: Home / Self Care | Payer: Medicare Other | Source: Ambulatory Visit | Attending: Surgery | Admitting: Surgery

## 2012-04-30 ENCOUNTER — Ambulatory Visit (HOSPITAL_BASED_OUTPATIENT_CLINIC_OR_DEPARTMENT_OTHER): Payer: Medicare Other | Admitting: Certified Registered Nurse Anesthetist

## 2012-04-30 ENCOUNTER — Encounter (HOSPITAL_BASED_OUTPATIENT_CLINIC_OR_DEPARTMENT_OTHER)
Admission: RE | Disposition: A | Discharge status: Home / Self Care | Payer: Self-pay | Source: Ambulatory Visit | Attending: Surgery

## 2012-04-30 DIAGNOSIS — K4090 Unilateral inguinal hernia, without obstruction or gangrene, not specified as recurrent: Secondary | ICD-10-CM

## 2012-04-30 DIAGNOSIS — I10 Essential (primary) hypertension: Secondary | ICD-10-CM | POA: Insufficient documentation

## 2012-04-30 HISTORY — DX: Retention of urine, unspecified: R33.9

## 2012-04-30 HISTORY — DX: Polyneuropathy, unspecified: G62.9

## 2012-04-30 HISTORY — PX: INGUINAL HERNIA REPAIR: SHX194

## 2012-04-30 LAB — POCT I-STAT, CHEM 8
Calcium, Ion: 1.23 mmol/L (ref 1.13–1.30)
HCT: 44 % (ref 39.0–52.0)
TCO2: 25 mmol/L (ref 0–100)

## 2012-04-30 SURGERY — REPAIR, HERNIA, INGUINAL, ADULT
Anesthesia: General | Site: Groin | Laterality: Left | Wound class: Clean

## 2012-04-30 MED ORDER — HYDROMORPHONE HCL 2 MG PO TABS: 2.0000 mg | ORAL_TABLET | ORAL | Status: DC | PRN

## 2012-04-30 MED ORDER — PROMETHAZINE HCL 25 MG/ML IJ SOLN: 6.2500 mg | INTRAMUSCULAR | Status: DC | PRN

## 2012-04-30 MED ORDER — OXYCODONE HCL 5 MG/5ML PO SOLN: 5.0000 mg | Freq: Once | ORAL | Status: DC | PRN

## 2012-04-30 MED ORDER — BUPIVACAINE HCL (PF) 0.5 % IJ SOLN
INTRAMUSCULAR | Status: DC | PRN
  Administered 2012-04-30: 20 mL

## 2012-04-30 MED ORDER — PROPOFOL 10 MG/ML IV BOLUS
INTRAVENOUS | Status: DC | PRN
  Administered 2012-04-30: 200 mg via INTRAVENOUS

## 2012-04-30 MED ORDER — FENTANYL CITRATE 0.05 MG/ML IJ SOLN
INTRAMUSCULAR | Status: DC | PRN
  Administered 2012-04-30: 25 ug via INTRAVENOUS
  Administered 2012-04-30: 50 ug via INTRAVENOUS

## 2012-04-30 MED ORDER — MEPERIDINE HCL 25 MG/ML IJ SOLN: 6.2500 mg | INTRAMUSCULAR | Status: DC | PRN

## 2012-04-30 MED ORDER — MIDAZOLAM HCL 5 MG/5ML IJ SOLN
INTRAMUSCULAR | Status: DC | PRN
  Administered 2012-04-30: 1 mg via INTRAVENOUS

## 2012-04-30 MED ORDER — FENTANYL CITRATE 0.05 MG/ML IJ SOLN
50.0000 ug | INTRAMUSCULAR | Status: DC | PRN
Start: 2012-04-30 — End: 2012-04-30

## 2012-04-30 MED ORDER — EPHEDRINE SULFATE 50 MG/ML IJ SOLN
INTRAMUSCULAR | Status: DC | PRN
  Administered 2012-04-30 (×2): 10 mg via INTRAVENOUS

## 2012-04-30 MED ORDER — MIDAZOLAM HCL 2 MG/2ML IJ SOLN: 0.5000 mg | Freq: Once | INTRAMUSCULAR | Status: DC | PRN

## 2012-04-30 MED ORDER — MIDAZOLAM HCL 2 MG/2ML IJ SOLN: 1.0000 mg | INTRAMUSCULAR | Status: DC | PRN

## 2012-04-30 MED ORDER — ONDANSETRON HCL 4 MG/2ML IJ SOLN
INTRAMUSCULAR | Status: DC | PRN
  Administered 2012-04-30: 4 mg via INTRAVENOUS

## 2012-04-30 MED ORDER — LIDOCAINE HCL (CARDIAC) 20 MG/ML IV SOLN
INTRAVENOUS | Status: DC | PRN
  Administered 2012-04-30: 60 mg via INTRAVENOUS

## 2012-04-30 MED ORDER — OXYCODONE HCL 5 MG PO TABS: 5.0000 mg | ORAL_TABLET | Freq: Once | ORAL | Status: DC | PRN

## 2012-04-30 MED ORDER — CHLORHEXIDINE GLUCONATE 4 % EX LIQD: 1.0000 "application " | Freq: Once | CUTANEOUS | Status: DC

## 2012-04-30 MED ORDER — CEFAZOLIN SODIUM-DEXTROSE 2-3 GM-% IV SOLR
2.0000 g | INTRAVENOUS | Status: AC
  Administered 2012-04-30: 2 g via INTRAVENOUS

## 2012-04-30 MED ORDER — LIDOCAINE-EPINEPHRINE (PF) 1 %-1:200000 IJ SOLN
INTRAMUSCULAR | Status: DC | PRN
  Administered 2012-04-30: 20 mL

## 2012-04-30 MED ORDER — DEXAMETHASONE SODIUM PHOSPHATE 4 MG/ML IJ SOLN
INTRAMUSCULAR | Status: DC | PRN
  Administered 2012-04-30: 10 mg via INTRAVENOUS

## 2012-04-30 MED ORDER — HYDROMORPHONE HCL PF 1 MG/ML IJ SOLN: 0.2500 mg | INTRAMUSCULAR | Status: DC | PRN

## 2012-04-30 MED ORDER — LACTATED RINGERS IV SOLN
INTRAVENOUS | Status: DC
  Administered 2012-04-30 (×2): via INTRAVENOUS

## 2012-04-30 SURGICAL SUPPLY — 41 items
ADH SKN CLS APL DERMABOND .7 (GAUZE/BANDAGES/DRESSINGS) ×1
BLADE HEX COATED 2.75 (ELECTRODE) ×2 IMPLANT
BLADE SURG 10 STRL SS (BLADE) ×2 IMPLANT
BLADE SURG ROTATE 9660 (MISCELLANEOUS) ×2 IMPLANT
CHLORAPREP W/TINT 26ML (MISCELLANEOUS) ×2 IMPLANT
CLIP TI WIDE RED SMALL 6 (CLIP) IMPLANT
CLOTH BEACON ORANGE TIMEOUT ST (SAFETY) ×2 IMPLANT
COVER MAYO STAND STRL (DRAPES) ×2 IMPLANT
COVER TABLE BACK 60X90 (DRAPES) ×2 IMPLANT
DECANTER SPIKE VIAL GLASS SM (MISCELLANEOUS) IMPLANT
DERMABOND ADVANCED (GAUZE/BANDAGES/DRESSINGS) ×1
DERMABOND ADVANCED .7 DNX12 (GAUZE/BANDAGES/DRESSINGS) ×1 IMPLANT
DRAIN PENROSE 1/2X12 LTX STRL (WOUND CARE) ×2 IMPLANT
DRAPE LAPAROTOMY TRNSV 102X78 (DRAPE) ×2 IMPLANT
DRAPE UTILITY XL STRL (DRAPES) ×2 IMPLANT
ELECT REM PT RETURN 9FT ADLT (ELECTROSURGICAL) ×2
ELECTRODE REM PT RTRN 9FT ADLT (ELECTROSURGICAL) ×1 IMPLANT
GLOVE BIO SURGEON STRL SZ 6.5 (GLOVE) ×2 IMPLANT
GLOVE EUDERMIC 7 POWDERFREE (GLOVE) ×2 IMPLANT
GLOVE EXAM NITRILE EXT CUFF MD (GLOVE) ×2 IMPLANT
GOWN PREVENTION PLUS XLARGE (GOWN DISPOSABLE) ×4 IMPLANT
MESH HERNIA 3X6 (Mesh General) ×2 IMPLANT
NEEDLE HYPO 22GX1.5 SAFETY (NEEDLE) ×2 IMPLANT
NEEDLE HYPO 25X1 1.5 SAFETY (NEEDLE) ×2 IMPLANT
NS IRRIG 1000ML POUR BTL (IV SOLUTION) IMPLANT
PACK BASIN DAY SURGERY FS (CUSTOM PROCEDURE TRAY) ×2 IMPLANT
PENCIL BUTTON HOLSTER BLD 10FT (ELECTRODE) ×2 IMPLANT
SLEEVE SCD COMPRESS KNEE MED (MISCELLANEOUS) ×2 IMPLANT
SPONGE INTESTINAL PEANUT (DISPOSABLE) ×2 IMPLANT
SPONGE LAP 4X18 X RAY DECT (DISPOSABLE) ×2 IMPLANT
SUT MNCRL AB 4-0 PS2 18 (SUTURE) ×2 IMPLANT
SUT PROLENE 2 0 CT2 30 (SUTURE) ×4 IMPLANT
SUT SILK 2 0 SH (SUTURE) IMPLANT
SUT VIC AB 3-0 CT1 27 (SUTURE) ×4
SUT VIC AB 3-0 CT1 27XBRD (SUTURE) ×2 IMPLANT
SUT VIC AB 4-0 BRD 54 (SUTURE) ×2 IMPLANT
SYR CONTROL 10ML LL (SYRINGE) ×2 IMPLANT
TAPE HYPAFIX 4 X10 (GAUZE/BANDAGES/DRESSINGS) IMPLANT
TOWEL OR 17X24 6PK STRL BLUE (TOWEL DISPOSABLE) ×2 IMPLANT
TOWEL OR NON WOVEN STRL DISP B (DISPOSABLE) IMPLANT
WATER STERILE IRR 1000ML POUR (IV SOLUTION) IMPLANT

## 2012-04-30 NOTE — Anesthesia Postprocedure Evaluation (Signed)
  Anesthesia Post-op Note  Patient: Brian Ruiz  Procedure(s) Performed: Procedure(s) (LRB) with comments: HERNIA REPAIR INGUINAL ADULT (Left) - repair left inguinal hernia  Patient Location: PACU  Anesthesia Type:General  Level of Consciousness: awake, alert , oriented and patient cooperative  Airway and Oxygen Therapy: Patient Spontanous Breathing  Post-op Pain: none  Post-op Assessment: Post-op Vital signs reviewed, Patient's Cardiovascular Status Stable, Respiratory Function Stable, Patent Airway, No signs of Nausea or vomiting and Pain level controlled  Post-op Vital Signs: Reviewed and stable  Complications: No apparent anesthesia complications

## 2012-04-30 NOTE — Interval H&P Note (Signed)
History and Physical Interval Note:  04/30/2012 7:25 AM  Brian Ruiz  has presented today for surgery, with the diagnosis of left inguinal hernia  The various methods of treatment have been discussed with the patient and family. After consideration of risks, benefits and other options for treatment, the patient has consented to  Procedure(s) (LRB) with comments: HERNIA REPAIR INGUINAL ADULT (N/A) - repair left inguinal hernia as a surgical intervention .  The patient's history has been reviewed, patient examined, no change in status, stable for surgery.  I have reviewed the patient's chart and labs.  Questions were answered to the patient's satisfaction.   The left inguinal area is marked as the operative site. Patient notes that he is now on Rapaflo and voiding ok  Jaunita Mikels J

## 2012-04-30 NOTE — Anesthesia Procedure Notes (Signed)
Procedure Name: LMA Insertion Date/Time: 04/30/2012 7:55 AM Performed by: Nashiya Disbrow D Pre-anesthesia Checklist: Patient identified, Emergency Drugs available, Suction available and Patient being monitored Patient Re-evaluated:Patient Re-evaluated prior to inductionOxygen Delivery Method: Circle System Utilized Preoxygenation: Pre-oxygenation with 100% oxygen Intubation Type: IV induction Ventilation: Mask ventilation without difficulty LMA: LMA inserted LMA Size: 5.0 Number of attempts: 1 Placement Confirmation: positive ETCO2 Tube secured with: Tape Dental Injury: Teeth and Oropharynx as per pre-operative assessment

## 2012-04-30 NOTE — H&P (View-Only) (Signed)
NAME: Brian Ruiz DOB: 08/23/1945 MRN: [id number]                                                                                      DATE: 04/03/2012  PCP: BOUSKA,Stokes E, MD Referring Provider: Bouska, Caidence E, MD  IMPRESSION:  Reducible left inguinal hernia Significant degenerative disease of the back Distended urinary bladder noted on recent MRI scan, uncertain significance, urological consultation pending  PLAN:   I recommended that he have elective repair of his left inguinal hernia after his GU issues have been evaluated and resolved. He wishes to have surgery because he is uncomfortable. I have discussed with the patient the fact that he has an inguinal hernia and reviewed the pathophysiology of that diagnosis. I have given him educational materials about this. I discussed options for treatment including observation or repair and have discussed both laparoscopic and open inguinal hernia repairs as potential techniques. We have discussed the use of mesh. We have discussed risks of surgery including bleeding, infection, recurrence, postoperative pain and possible chronic groin pain, testicular injury, and potential issues with voiding. I believe the patient's questions have been answered and that he has a good understanding of the issues                  CC:  Chief Complaint  Patient presents with  . Inguinal Hernia    left    HPI:  Brian Ruiz is a 66 y.o.  male who presents for evaluation of Left inguinal hernia. He was lifting a dresser and subsequently had some discomfort and possibly felt a tear in the left inguinal area. Since then he has noted an intermittent bulge which he can reduce by pressing on it. It is somewhat soft and squishy. It is a little bit uncomfortable persistently. He has not noticed any problems on the right side. He's never had an inguinal hernia diagnosed previously. This occurred about a month ago when he first had symptoms.  PMH:  has a past medical  history of Back pain, chronic; Hypertension; Arthritis; Scoliosis; S/P thyroidectomy (1967); Heart murmur; and Inguinal hernia.  PSH:   has past surgical history that includes Thyroidectomy (1967); Back surgery (1975); Foot surgery (1985, 2000); and Hand surgery (2005).  ALLERGIES:   Allergies  Allergen Reactions  . Oxycodone Other (See Comments)    Agitation. Per the any pain medication that has codeine in it causes that reaction.    MEDICATIONS: Current outpatient prescriptions:acetaminophen (TYLENOL) 500 MG tablet, Take 1,000 mg by mouth every 8 (eight) hours as needed. For pain. , Disp: , Rfl: ;  cholecalciferol (VITAMIN D) 400 UNITS TABS, Take 400 Units by mouth daily.  , Disp: , Rfl: ;  metoprolol tartrate (LOPRESSOR) 25 MG tablet, Take 25 mg by mouth 2 (two) times daily.  , Disp: , Rfl:   ROS: He has filled out our 12 point review of systems and it is negative . He does note significant back issues and recently had a cardiac evaluation and apparently has a "leaky" aortic valve but not significant. He was told that his echocardiogram showed good cardiac function.. EXAM:   VITAL   SIGNS: BP 144/88  Pulse 76  Temp 97.6 F (36.4 C) (Temporal)  Resp 16  Ht 6' 3" (1.905 m)  Wt 195 lb 4 oz (88.565 kg)  BMI 24.40 kg/m2  GENERAL:  The patient is alert, oriented, and generally healthy-appearing, NAD. Mood and affect are normal.  HEENT:  The head is normocephalic, the eyes nonicteric, the pupils were round regular and equal. EOMs are normal. Pharynx normal. Dentition good.  NECK:  The neck is supple and there are no masses or thyromegaly.  LUNGS:  Normal respirations and clear to auscultation.  HEART:  Regular rhythm, with no murmurs rubs or gallops. Pulses are intact carotid dorsalis pedis and posterior tibial. No significant varicosities are noted.  ABDOMEN:  Soft, flat, and nontender. No masses or organomegaly is noted. No hernias are noted. Bowel sounds are normal.  GU:    Normal male with normal testes. There is no right inguinal hernia present. There is a reducible left inguinal hernia which I think is direct.  EXTREMITIES:  Good range of motion, no edema.   DATA REVIEWED:  I have reviewed the MRI report and lab reports from his primary care office. Of note is that he had a large bladder as well as degenerative disc disease.    Brian Ruiz J 04/03/2012  CC: Bouska, Baruch E, MD, BOUSKA,Brave E, MD        

## 2012-04-30 NOTE — Anesthesia Preprocedure Evaluation (Signed)
Anesthesia Evaluation  Patient identified by MRN, date of birth, ID band Patient awake    Reviewed: Allergy & Precautions, H&P , NPO status , Patient's Chart, lab work & pertinent test results, reviewed documented beta blocker date and time   History of Anesthesia Complications Negative for: history of anesthetic complications  Airway Mallampati: I TM Distance: >3 FB Neck ROM: Full    Dental No notable dental hx. (+) Teeth Intact and Dental Advisory Given   Pulmonary neg pulmonary ROS,  breath sounds clear to auscultation  Pulmonary exam normal       Cardiovascular hypertension, + Valvular Problems/Murmurs (10/13 ECHO: EF 50-55%, mild AI, mild MR) AI and MR Rhythm:Regular Rate:Normal + Systolic murmurs (I/VI)    Neuro/Psych negative neurological ROS     GI/Hepatic negative GI ROS, Neg liver ROS,   Endo/Other    Renal/GU negative Renal ROS     Musculoskeletal   Abdominal   Peds  Hematology negative hematology ROS (+)   Anesthesia Other Findings   Reproductive/Obstetrics                           Anesthesia Physical Anesthesia Plan  ASA: II  Anesthesia Plan: General   Post-op Pain Management:    Induction: Intravenous  Airway Management Planned: LMA  Additional Equipment:   Intra-op Plan:   Post-operative Plan:   Informed Consent: I have reviewed the patients History and Physical, chart, labs and discussed the procedure including the risks, benefits and alternatives for the proposed anesthesia with the patient or authorized representative who has indicated his/her understanding and acceptance.   Dental advisory given  Plan Discussed with: CRNA and Surgeon  Anesthesia Plan Comments: (Plan routine monitors, GA- LMA OK)        Anesthesia Quick Evaluation

## 2012-04-30 NOTE — Transfer of Care (Signed)
Immediate Anesthesia Transfer of Care Note  Patient: Brian Ruiz  Procedure(s) Performed: Procedure(s) (LRB) with comments: HERNIA REPAIR INGUINAL ADULT (Left) - repair left inguinal hernia  Patient Location: PACU  Anesthesia Type:General  Level of Consciousness: awake, alert , oriented and patient cooperative  Airway & Oxygen Therapy: Patient Spontanous Breathing and Patient connected to face mask oxygen  Post-op Assessment: Report given to PACU RN and Post -op Vital signs reviewed and stable  Post vital signs: Reviewed and stable  Complications: No apparent anesthesia complications

## 2012-04-30 NOTE — Op Note (Signed)
Brian Ruiz [birth year] [id number] 04/03/2012  Preoperative diagnosis: Left inguinal hernia  Postoperative diagnosis: Same  Procedure: Repair of left inguinal hernia with mesh  Surgeon: Currie Paris, MD, FACS  Anesthesia:General  Clinical History and Indications: The patient has a symptomatic LIH he desires to have repaired  Description of Procedure:The patient was seen in the holding area and the plans for the procedure as noted above confirmed with the patient. We reviewed again the risks and complications and the patient had no further questions. I then marked the left  as the operative side. This was confirmed with the patient. He wishes to proceed.  The patient was taken to the operating room and after satisfactory general anesthesia was obtained the left  inguinal area was prepped and draped as a sterile field. A time out was done.  I used an equal mixture of 0.5% plain Marcaine and 1% Xylocaine with epinephrine for local anesthesia and to help with postoperative pain management. The area of the incision was infiltrated first as well as a field block going medially and inferiorly. I also injected some below the external oblique aponeurosis at the level of the anterior superior iliac spine.  An oblique incision was made and deepened to the external oblique aponeurosis. Bleeders were either cauterized or tied with 3-0 Vicryl. The external oblique aponeurosis was opened in the line of its fibers and elevated off of the underlying tissue. The ilioinguinal nerve was noted and protected.  The spermatic cord was then dissected up off of the inguinal floor and surrounded with a drain. The inguinal floor was attenuated and there was an indirect sac present. It was reduced along with some fat that was protruding thru the deep ring and a single suture of 2-0 Prolene was placed to tighten the deep ring, closing the transversalis just medial to the deep ring.  I then took some Bard mesh and  cut it to shape. It was anchored at the pubic tubercle and a running 2-0 Prolene used to suture it to the edge of the inferior shelving edge of the external oblique. It was split laterally to go around the cord and then laid gently over the internal oblique medially. Several more sutures of 2-0 Prolene were used to anchor the mesh to the internal oblique. The tails of the mesh were crossed lateral to the cord structures and deep ring and tacked together.  This appeared to produce a nice coverage and repair with no tension. There was adequate space for the cord structures to exit through the mesh and deep ring.  I checked to make sure everything was dry. Additional local had been infiltrated as I was working to be sure we had complete anesthesia of the entire operative field.  The incision was then closed with a running 3-0 Vicryl on the external oblique, closing it over the repair. Scarpa's fascia was closed with a running 3-0 Vicryl and the skin with a running 4-0 Monocryl subcuticular and Dermabond on the skin.  The patient tolerated the procedure well. There were no operative complications. There was minimal blood loss. All counts were correct.  Currie Paris, MD, FACS 04/30/2012 8:52 AM

## 2012-05-01 ENCOUNTER — Encounter (HOSPITAL_BASED_OUTPATIENT_CLINIC_OR_DEPARTMENT_OTHER): Payer: Self-pay | Admitting: Surgery

## 2012-05-01 ENCOUNTER — Telehealth (INDEPENDENT_AMBULATORY_CARE_PROVIDER_SITE_OTHER): Payer: Self-pay | Admitting: General Surgery

## 2012-05-01 NOTE — Telephone Encounter (Signed)
Pt called to go over post op care.  He is preparing to shower today.  Reminded him to wash gently with soap and water, rinse well and pat dry.  Continue to use ice pack to site.  Expect some soft tissue swelling of his external genitalia; prop it up with a rolled towel when sitting.  Pt had BM today.  Discussed progressing his activity, including walking and when he can drive.  Questions answered and emotional support offered.

## 2012-05-16 ENCOUNTER — Ambulatory Visit (INDEPENDENT_AMBULATORY_CARE_PROVIDER_SITE_OTHER): Payer: Medicare Other | Admitting: Surgery

## 2012-05-16 ENCOUNTER — Encounter (INDEPENDENT_AMBULATORY_CARE_PROVIDER_SITE_OTHER): Payer: Self-pay | Admitting: Surgery

## 2012-05-16 VITALS — BP 150/86 | HR 84 | Resp 16 | Ht 75.0 in | Wt 197.0 lb

## 2012-05-16 DIAGNOSIS — Z09 Encounter for follow-up examination after completed treatment for conditions other than malignant neoplasm: Secondary | ICD-10-CM

## 2012-05-16 NOTE — Progress Notes (Signed)
NAME: Brian Ruiz                                            DOB: [birth year] DATE: 05/16/2012                                                  MRN: [id number]  CC: Post op   HPI: This patient comes in for post op follow-up .Heunderwent LIH repair on 04/30/12. He feels that he is doing well.The lateral edge of the incision opened a bit, but no significant drainage  PE:  VITAL SIGNS: BP 150/86  Pulse 84  Resp 16  Ht 6\' 3"  (1.905 m)  Wt 197 lb (89.359 kg)  BMI 24.62 kg/m2  General: The patient appears to be healthy, NAD Repair solid, wound disrupted by about 1mm laterally and very superficial, just dermal, likely where the knot from the Monocryl was  DATA REVIEWED: No new  IMPRESSION: The patient is doing well S/P [facility] repair.    PLAN: RTC PRN

## 2012-05-16 NOTE — Patient Instructions (Signed)
Keep a bandaid over the end of the incision until a dry scab forms. Limit activites for six weeks from surgery.  .We will see you again on an as needed basis. Please call the office at [phone] if you have any questions or concerns. Thank you for allowing [place] to take care of you.

## 2012-05-21 ENCOUNTER — Encounter (INDEPENDENT_AMBULATORY_CARE_PROVIDER_SITE_OTHER): Payer: Self-pay

## 2012-10-29 DIAGNOSIS — M48061 Spinal stenosis, lumbar region without neurogenic claudication: Secondary | ICD-10-CM | POA: Insufficient documentation

## 2013-04-12 DIAGNOSIS — N400 Enlarged prostate without lower urinary tract symptoms: Secondary | ICD-10-CM | POA: Insufficient documentation

## 2013-04-12 DIAGNOSIS — N401 Enlarged prostate with lower urinary tract symptoms: Secondary | ICD-10-CM | POA: Insufficient documentation

## 2013-04-12 DIAGNOSIS — M549 Dorsalgia, unspecified: Secondary | ICD-10-CM | POA: Insufficient documentation

## 2013-04-12 DIAGNOSIS — I10 Essential (primary) hypertension: Secondary | ICD-10-CM | POA: Insufficient documentation

## 2013-04-12 DIAGNOSIS — R011 Cardiac murmur, unspecified: Secondary | ICD-10-CM | POA: Insufficient documentation

## 2013-04-12 DIAGNOSIS — Z1211 Encounter for screening for malignant neoplasm of colon: Secondary | ICD-10-CM | POA: Insufficient documentation

## 2013-07-04 DIAGNOSIS — M419 Scoliosis, unspecified: Secondary | ICD-10-CM | POA: Insufficient documentation

## 2013-11-26 DIAGNOSIS — Z0000 Encounter for general adult medical examination without abnormal findings: Secondary | ICD-10-CM | POA: Insufficient documentation

## 2013-11-26 DIAGNOSIS — Z13820 Encounter for screening for osteoporosis: Secondary | ICD-10-CM | POA: Insufficient documentation

## 2014-10-06 ENCOUNTER — Other Ambulatory Visit: Payer: Self-pay | Admitting: Neurosurgery

## 2014-10-06 DIAGNOSIS — M4005 Postural kyphosis, thoracolumbar region: Secondary | ICD-10-CM

## 2014-10-06 DIAGNOSIS — R5381 Other malaise: Secondary | ICD-10-CM

## 2014-10-24 ENCOUNTER — Other Ambulatory Visit: Payer: Self-pay | Admitting: Neurosurgery

## 2014-10-24 DIAGNOSIS — M8580 Other specified disorders of bone density and structure, unspecified site: Secondary | ICD-10-CM

## 2014-10-31 ENCOUNTER — Ambulatory Visit
Admission: RE | Admit: 2014-10-31 | Discharge: 2014-10-31 | Disposition: A | Discharge status: Home / Self Care | Payer: Medicare Other | Source: Ambulatory Visit | Attending: Neurosurgery | Admitting: Neurosurgery

## 2014-10-31 DIAGNOSIS — M8580 Other specified disorders of bone density and structure, unspecified site: Secondary | ICD-10-CM

## 2016-10-27 DIAGNOSIS — Z136 Encounter for screening for cardiovascular disorders: Secondary | ICD-10-CM | POA: Insufficient documentation

## 2016-10-27 DIAGNOSIS — M810 Age-related osteoporosis without current pathological fracture: Secondary | ICD-10-CM | POA: Insufficient documentation

## 2016-10-27 DIAGNOSIS — Z125 Encounter for screening for malignant neoplasm of prostate: Secondary | ICD-10-CM | POA: Insufficient documentation

## 2016-10-27 DIAGNOSIS — Z1159 Encounter for screening for other viral diseases: Secondary | ICD-10-CM | POA: Insufficient documentation

## 2016-11-01 ENCOUNTER — Other Ambulatory Visit: Payer: Self-pay | Admitting: Family Medicine

## 2016-11-01 DIAGNOSIS — M810 Age-related osteoporosis without current pathological fracture: Secondary | ICD-10-CM

## 2016-11-02 ENCOUNTER — Other Ambulatory Visit: Payer: Self-pay | Admitting: Family Medicine

## 2016-11-02 DIAGNOSIS — Z136 Encounter for screening for cardiovascular disorders: Secondary | ICD-10-CM

## 2016-11-04 ENCOUNTER — Ambulatory Visit
Admission: RE | Admit: 2016-11-04 | Discharge: 2016-11-04 | Disposition: A | Discharge status: Home / Self Care | Payer: Medicare Other | Source: Ambulatory Visit | Attending: Family Medicine | Admitting: Family Medicine

## 2016-11-04 DIAGNOSIS — M810 Age-related osteoporosis without current pathological fracture: Secondary | ICD-10-CM

## 2016-11-11 ENCOUNTER — Ambulatory Visit
Admission: RE | Admit: 2016-11-11 | Discharge: 2016-11-11 | Disposition: A | Discharge status: Home / Self Care | Payer: Medicare Other | Source: Ambulatory Visit | Attending: Family Medicine | Admitting: Family Medicine

## 2016-11-11 DIAGNOSIS — Z136 Encounter for screening for cardiovascular disorders: Secondary | ICD-10-CM

## 2016-11-15 DIAGNOSIS — I77819 Aortic ectasia, unspecified site: Secondary | ICD-10-CM | POA: Insufficient documentation

## 2017-06-14 DIAGNOSIS — R5382 Chronic fatigue, unspecified: Secondary | ICD-10-CM | POA: Insufficient documentation

## 2017-06-14 DIAGNOSIS — R002 Palpitations: Secondary | ICD-10-CM | POA: Insufficient documentation

## 2017-07-21 ENCOUNTER — Telehealth: Payer: Self-pay | Admitting: *Deleted

## 2017-07-21 ENCOUNTER — Encounter: Payer: Self-pay | Admitting: *Deleted

## 2017-07-21 NOTE — Telephone Encounter (Signed)
Fax request notes from Dr. Tracey Harriesavid Bouska of [facility] Primary care.  Aura DialsBouska, Trayveon E, MD  [address]  [address]  [place], [place] [id number]  Phone: [phone]  Fax: [phone]

## 2017-07-28 ENCOUNTER — Encounter: Payer: Self-pay | Admitting: Cardiovascular Disease

## 2017-07-28 ENCOUNTER — Other Ambulatory Visit: Payer: Self-pay | Admitting: Nurse Practitioner

## 2017-07-28 ENCOUNTER — Ambulatory Visit (INDEPENDENT_AMBULATORY_CARE_PROVIDER_SITE_OTHER): Payer: Medicare Other | Admitting: Cardiovascular Disease

## 2017-07-28 VITALS — BP 142/92 | HR 80 | Ht 78.0 in | Wt 198.1 lb

## 2017-07-28 DIAGNOSIS — I499 Cardiac arrhythmia, unspecified: Secondary | ICD-10-CM

## 2017-07-28 DIAGNOSIS — E875 Hyperkalemia: Secondary | ICD-10-CM

## 2017-07-28 DIAGNOSIS — I119 Hypertensive heart disease without heart failure: Secondary | ICD-10-CM | POA: Diagnosis not present

## 2017-07-28 DIAGNOSIS — I498 Other specified cardiac arrhythmias: Secondary | ICD-10-CM

## 2017-07-28 LAB — BASIC METABOLIC PANEL
BUN/Creatinine Ratio: 15 (ref 10–24)
BUN: 13 mg/dL (ref 8–27)
CALCIUM: 9.7 mg/dL (ref 8.6–10.2)
CO2: 24 mmol/L (ref 20–29)
CREATININE: 0.87 mg/dL (ref 0.76–1.27)
Chloride: 95 mmol/L — ABNORMAL LOW (ref 96–106)
GFR, EST AFRICAN AMERICAN: 100 mL/min/{1.73_m2} (ref >59)
GFR, EST NON AFRICAN AMERICAN: 87 mL/min/{1.73_m2} (ref >59)
Glucose: 85 mg/dL (ref 65–99)
Potassium: 5.4 mmol/L — ABNORMAL HIGH (ref 3.5–5.2)
Sodium: 133 mmol/L — ABNORMAL LOW (ref 134–144)

## 2017-07-28 MED ORDER — METOPROLOL TARTRATE 50 MG PO TABS
50.0000 mg | ORAL_TABLET | Freq: Two times a day (BID) | ORAL | 3 refills | Status: DC
Start: 2017-07-28 — End: 2017-11-08

## 2017-07-28 NOTE — Progress Notes (Signed)
Cardiology Office Note:    Date:  07/28/2017   ID:  Brian Ruiz, DOB 13-Dec-1945, MRN [id number]  PCP:  Brian Harries, MD  Cardiologist:  No primary care provider on file.   Referring MD: Brian Harries, MD   1.  Hypertension 2.  Premature ventricular contractions 3.  Anxiety  Chief Complaint  Patient presents with  . Palpitations     July 28, 2017   Brian Ruiz is a 72 y.o. male with a hx of palpitations Seen with wife , Brian Ruiz. ( saw her last year for palps )    Has had palpitations for years,  Off and on.  Saw Brian Ruiz Other recently for fatigue and slow HR ( on his home monitor, was 40s)  No dyspnea, no cough, no fever, no CP ,nio PND or orthopnea  Has had sensation of being hot and then cold  His palpitations for years  Has had orthostatic hypotension Has been diagnosed with SVT.  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   Has had numerous injections    Had a cath with Dr. Katrinka Ruiz ( 1996 ) which was normal .   Worked in [employer] and then [employer].    Past Medical History:  Diagnosis Date  . Arthritis   . Back pain, chronic    intolerant to narcotics, avoids NSAIDs due to vioxx related bleed,  sees pain clinic as [place], prior eval by [facility] neuro in [place]  . Heart murmur   . Hypertension   . Inguinal hernia   . Polyneuropathy   . S/P thyroidectomy 1967  . Scoliosis   . Urinary retention     Past Surgical History:  Procedure Laterality Date  . BACK SURGERY  1975   lumbx2  . COLONOSCOPY    . FOOT SURGERY  1985, 2000   due to arthritis  . HAND SURGERY  2005   left - due to arthritis  . INGUINAL HERNIA REPAIR  04/30/2012   Procedure: HERNIA REPAIR INGUINAL ADULT;  Surgeon: Brian Paris, MD;  Location: [facility];  Service: General;  Laterality: Left;  repair left inguinal hernia  .  THYROIDECTOMY  1967  . TONSILLECTOMY      Current Medications: Current Meds  Medication Sig  . acetaminophen (TYLENOL) 500 MG tablet Take 1,000 mg by mouth every 8 (eight) hours as needed. For pain.   Marland Kitchen losartan (COZAAR) 25 MG tablet Take 25 mg by mouth daily.   . metoprolol tartrate (LOPRESSOR) 25 MG tablet Take 25 mg by mouth 2 (two) times daily.       Allergies:   Aspirin; Nsaids; Oxycodone; Tolmetin; Tramadol; and Codeine   Social History   Socioeconomic History  . Marital status: Married    Spouse name: None  . Number of children: None  . Years of education: None  . Highest education level: None  Social Needs  . Financial resource strain: None  . Food insecurity - worry: None  . Food insecurity - inability: None  . Transportation needs - medical: None  . Transportation needs - non-medical: None  Occupational History  . None  Tobacco Use  . Smoking status: Never Smoker  . Smokeless tobacco: Never Used  Substance and Sexual Activity  . Alcohol use: No  . Drug use: No  .  Sexual activity: Yes    Partners: Female  Other Topics Concern  . None  Social History Narrative   Quit ETOH > 20years ago   Disabled due to back pain   Non-smoker   Lives at home   Wife Brian Ruiz   Son +     Family History: The patient's family history includes Heart disease in his father.  ROS:   Please see the history of present illness.     All other systems reviewed and are negative.  EKGs/Labs/Other Studies Reviewed:    The following studies were reviewed today:   EKG:  July 28, 2017:   NSR at 80 , marked sinus arrhythmia.  Frequent PVCs Otherwise normal   Recent Labs: No results found for requested labs within last 8760 hours.  Recent Lipid Panel No results found for: CHOL, TRIG, HDL, CHOLHDL, VLDL, LDLCALC, LDLDIRECT  Physical Exam:    VS:  BP (!) 142/92   Pulse 80   Ht 6\' 6"  (1.981 m)   Wt 198 lb 1.9 oz (89.9 kg)   BMI 22.90 kg/m     Wt Readings from Last 3  Encounters:  07/28/17 198 lb 1.9 oz (89.9 kg)  05/16/12 197 lb (89.4 kg)  04/23/12 195 lb (88.5 kg)     GEN:   Chronically ill appearing man , thin  HEENT: Normal NECK: No JVD; No carotid bruits LYMPHATICS: No lymphadenopathy CARDIAC: RR, no murmurs, rubs, gallops RESPIRATORY:  Clear to auscultation without rales, wheezing or rhonchi  ABDOMEN: Soft, non-tender, non-distended MUSCULOSKELETAL:  No edema; No deformity  SKIN: Warm and dry NEUROLOGIC:  Alert and oriented x 3 PSYCHIATRIC:  Normal affect   ASSESSMENT:    No diagnosis found. PLAN:    In order of problems listed above:  1. Palpitations: Brian HuaDavid presents with episodes of palpitations.  He was found to have bigeminy at one point and has frequent premature ventricular contractions by EKG.  I would like to get an echocardiogram to assess left ventricular function.  If his LV function is normal, then the PVCs would be considered fairly benign.  2.  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  His heart rate was actually 80 with PVCs intermixed with normal sinus rhythm.  We will increase his metoprolol to 50 mg twice a day which should help with this.  Get a 24-hour Holter monitor to assess his PVC burden.  If his PVCs are excessive, we will need to refer to EP.  3.  Hypertensive heart disease without congestive heart failure: Patient has long-standing hypertension.  Continue metoprolol at an increased dose as noted above.  Continue losartan.   Medication Adjustments/Labs and Tests Ordered: Current medicines are reviewed at length with the patient today.  Concerns regarding medicines are outlined above.  No orders of the defined types were placed in this encounter.  No orders of the defined types were placed in this encounter.   Signed, Kristeen MissPhilip Nahser, MD  07/28/2017 9:03 AM    [facility]

## 2017-07-28 NOTE — Patient Instructions (Signed)
Medication Instructions:  Your physician has recommended you make the following change in your medication:   INCREASE Metoprolol to 50 mg twice daily   Labwork: TODAY - basic metabolic panel   Testing/Procedures: Your physician has recommended that you wear a holter monitor. Holter monitors are medical devices that record the heart's electrical activity. Doctors most often use these monitors to diagnose arrhythmias. Arrhythmias are problems with the speed or rhythm of the heartbeat. The monitor is a small, portable device. You can wear one while you do your normal daily activities. This is usually used to diagnose what is causing palpitations/syncope (passing out).  Your physician has requested that you have an echocardiogram. Echocardiography is a painless test that uses sound waves to create images of your heart. It provides your doctor with information about the size and shape of your heart and how well your heart's chambers and valves are working. This procedure takes approximately one hour. There are no restrictions for this procedure.   Follow-Up: Your physician recommends that you schedule a follow-up appointment in: 3 months with Dr. Elease HashimotoNahser   If you need a refill on your cardiac medications before your next appointment, please call your pharmacy.   Thank you for choosing [facility]! Eligha BridegroomMichelle Wava Kildow, RN [phone]

## 2017-08-03 ENCOUNTER — Telehealth: Payer: Self-pay | Admitting: Cardiovascular Disease

## 2017-08-03 NOTE — Telephone Encounter (Signed)
Pt called to have the Holter monitor scheduled for 3/18   for 24 not 48 hours. Pt was reassured that the Holter monitor was order for 24 hrs. Pt verbalized understanding.

## 2017-08-03 NOTE — Telephone Encounter (Signed)
Brian Ruiz is calling because he is asking if he can have the holter monitor on for 24hrs versus 48 hrs . Please call    Thanks

## 2017-08-14 ENCOUNTER — Other Ambulatory Visit: Payer: Self-pay

## 2017-08-14 ENCOUNTER — Other Ambulatory Visit: Payer: Medicare Other | Admitting: *Deleted

## 2017-08-14 ENCOUNTER — Ambulatory Visit (HOSPITAL_COMMUNITY): Payer: Medicare Other | Attending: Cardiovascular Disease

## 2017-08-14 ENCOUNTER — Ambulatory Visit (INDEPENDENT_AMBULATORY_CARE_PROVIDER_SITE_OTHER): Payer: Medicare Other

## 2017-08-14 ENCOUNTER — Other Ambulatory Visit: Payer: Self-pay | Admitting: Cardiovascular Disease

## 2017-08-14 DIAGNOSIS — I119 Hypertensive heart disease without heart failure: Secondary | ICD-10-CM

## 2017-08-14 DIAGNOSIS — I493 Ventricular premature depolarization: Secondary | ICD-10-CM | POA: Diagnosis not present

## 2017-08-14 DIAGNOSIS — I080 Rheumatic disorders of both mitral and aortic valves: Secondary | ICD-10-CM | POA: Insufficient documentation

## 2017-08-14 DIAGNOSIS — I498 Other specified cardiac arrhythmias: Secondary | ICD-10-CM

## 2017-08-14 DIAGNOSIS — I499 Cardiac arrhythmia, unspecified: Principal | ICD-10-CM

## 2017-08-14 DIAGNOSIS — E875 Hyperkalemia: Secondary | ICD-10-CM

## 2017-08-14 DIAGNOSIS — R011 Cardiac murmur, unspecified: Secondary | ICD-10-CM | POA: Insufficient documentation

## 2017-08-14 LAB — BASIC METABOLIC PANEL
BUN / CREAT RATIO: 15 (ref 10–24)
BUN: 14 mg/dL (ref 8–27)
CHLORIDE: 92 mmol/L — AB (ref 96–106)
CO2: 23 mmol/L (ref 20–29)
CREATININE: 0.96 mg/dL (ref 0.76–1.27)
Calcium: 9.3 mg/dL (ref 8.6–10.2)
GFR calc Af Amer: 92 mL/min/{1.73_m2} (ref >59)
GFR calc non Af Amer: 79 mL/min/{1.73_m2} (ref >59)
GLUCOSE: 82 mg/dL (ref 65–99)
Potassium: 4.8 mmol/L (ref 3.5–5.2)
SODIUM: 129 mmol/L — AB (ref 134–144)

## 2017-11-08 ENCOUNTER — Ambulatory Visit (INDEPENDENT_AMBULATORY_CARE_PROVIDER_SITE_OTHER): Payer: Medicare Other | Admitting: Cardiovascular Disease

## 2017-11-08 ENCOUNTER — Encounter: Payer: Self-pay | Admitting: Cardiovascular Disease

## 2017-11-08 VITALS — BP 140/82 | HR 81 | Ht 72.0 in | Wt 197.2 lb

## 2017-11-08 DIAGNOSIS — I10 Essential (primary) hypertension: Secondary | ICD-10-CM

## 2017-11-08 DIAGNOSIS — I493 Ventricular premature depolarization: Secondary | ICD-10-CM | POA: Diagnosis not present

## 2017-11-08 MED ORDER — CARVEDILOL 25 MG PO TABS: 25.0000 mg | ORAL_TABLET | Freq: Two times a day (BID) | ORAL | 3 refills | Status: DC

## 2017-11-08 NOTE — Progress Notes (Signed)
Cardiology Office Note:    Date:  11/08/2017   ID:  Brian FavorDavid H Ruiz, DOB January 19, 1946, MRN [id number]  PCP:  Tracey HarriesBouska, Alby, MD  Cardiologist:  Kristeen MissPhilip Nahser, MD   Referring MD: Tracey HarriesBouska, Nicholaos, MD   1.  Hypertension 2.  Premature ventricular contractions 3.  Anxiety  Chief Complaint  Patient presents with  . Follow-up    ventricular bigeminy     July 28, 2017   Brian Ruiz is a 72 y.o. male with a hx of palpitations Seen with wife , Alvino Chapelllen. ( saw her last year for palps )    Has had palpitations for years,  Off and on.  Saw Dr. Everlene OtherBouska recently for fatigue and slow HR ( on his home monitor, was 40s)  No dyspnea, no cough, no fever, no CP ,nio PND or orthopnea  Has had sensation of being hot and then cold  His palpitations for years  Has had orthostatic hypotension Has been diagnosed with SVT.  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   Has had numerous injections   Had an abnormal stress test in the past.   ( false positive) .  Led to a heart cath  Had a cath with Dr. Katrinka BlazingSmith ( 1996 ) which was normal .   Worked in [employer] and then [employer].    November 08, 2017 :  Onalee HuaDavid is seen back today  Holter monitor revealed significant number of premature ventricular contractions.  He has a PVC burden of 15%. Echo showed normal left ventricular systolic function.  He has grade 1 diastolic dysfunction, mild mitral regurgitation and mild aortic insufficiency. We increased the metoprolol to 50 BID  We had suggested a myoview.   He declined the stress test because of back issues.   We discussed the fact that we could do a chemical stress test but he commented that he has had a chemical stress test in the past which was a false positive.  This led to heart catheterization which was ultimately negative and showed normal coronary arteries.      Past Medical History:  Diagnosis Date  . Arthritis   . Back pain, chronic    intolerant to narcotics, avoids NSAIDs due to vioxx related bleed,  sees pain clinic as [place], [facility] [place]  . Heart murmur   . Hypertension   . Inguinal hernia   . Polyneuropathy   . S/P thyroidectomy 1967  . Scoliosis   . Urinary retention     Past Surgical History:  Procedure Laterality Date  . BACK SURGERY  1975   lumbx2  . COLONOSCOPY    . FOOT SURGERY  1985, 2000   due to arthritis  . HAND SURGERY  2005   left - due to arthritis  . INGUINAL HERNIA REPAIR  04/30/2012   Procedure: HERNIA REPAIR INGUINAL ADULT;  Surgeon: Currie Parishristian J Streck, MD;  Location: [facility];  Service: General;  Laterality: Left;  repair left inguinal hernia  . THYROIDECTOMY  1967  . TONSILLECTOMY      Current Medications: Current Meds  Medication Sig  . acetaminophen (TYLENOL) 500 MG tablet Take 1,000 mg by mouth every 8 (eight) hours as needed. For pain.   Marland Kitchen. sertraline (ZOLOFT) 25 MG tablet Take 1 tablet by mouth  2 (two) times daily.  . [DISCONTINUED] metoprolol tartrate (LOPRESSOR) 50 MG tablet Take 1 tablet (50 mg total) by mouth 2 (two) times daily.     Allergies:   Aspirin; Nsaids; Oxycodone; Tolmetin; Tramadol; and Codeine   Social History   Socioeconomic History  . Marital status: Married    Spouse name: Not on file  . Number of children: Not on file  . Years of education: Not on file  . Highest education level: Not on file  Occupational History  . Not on file  Social Needs  . Financial resource strain: Not on file  . Food insecurity:    Worry: Not on file    Inability: Not on file  . Transportation needs:    Medical: Not on file    Non-medical: Not on file  Tobacco Use  . Smoking status: Never Smoker  . Smokeless tobacco: Never Used  Substance and Sexual Activity  . Alcohol use: No  . Drug use: No  . Sexual activity: Yes    Partners: Female   Lifestyle  . Physical activity:    Days per week: Not on file    Minutes per session: Not on file  . Stress: Not on file  Relationships  . Social connections:    Talks on phone: Not on file    Gets together: Not on file    Attends religious service: Not on file    Active member of club or organization: Not on file    Attends meetings of clubs or organizations: Not on file    Relationship status: Not on file  Other Topics Concern  . Not on file  Social History Narrative   Quit ETOH > 20years ago   Disabled due to back pain   Non-smoker   Lives at home   Wife Brian Ruiz   Son +     Family History: The patient's family history includes Heart disease in his father.  ROS:   Please see the history of present illness.     All other systems reviewed and are negative.  EKGs/Labs/Other Studies Reviewed:    The following studies were reviewed today:   EKG:  July 28, 2017:   NSR at 80 , marked sinus arrhythmia.  Frequent PVCs Otherwise normal   Recent Labs: 08/14/2017: BUN 14; Creatinine, Ser 0.96; Potassium 4.8; Sodium 129  Recent Lipid Panel No results found for: CHOL, TRIG, HDL, CHOLHDL, VLDL, LDLCALC, LDLDIRECT  Physical Exam:    Physical Exam: Blood pressure 140/82, pulse 81, height 6' (1.829 m), weight 197 lb 3.2 oz (89.4 kg), SpO2 98 %.  GEN:  Thin male  HEENT: Normal NECK: No JVD; No carotid bruits LYMPHATICS: No lymphadenopathy CARDIAC: RRR ,  Frequent PVCs ,  Soft systolic murmur  RESPIRATORY:  Clear to auscultation without rales, wheezing or rhonchi  ABDOMEN: Soft, non-tender, non-distended MUSCULOSKELETAL:  No edema; No deformity  SKIN: Warm and dry NEUROLOGIC:  Alert and oriented x 3   ASSESSMENT:    No diagnosis found. PLAN:    In order of problems listed above:  1. Palpitations:  Cannot really tell that he has PVCs   2.  Ventricular bigeminy:  Stable,   EF is normal  - has been normal by echo for years despite the fact that he has a 15% PVC  burden, he is maintained normal left ventricular systolic function.  In fact he had normal left LV function by cath and echocardiogram over the years.  We suggested that he get  a [employer] study but he declined.  He had a Myoview study in the past which was falsely positive which led to heart catheterization.  His heart cath was negative.  We discussed the fact that if he was having any symptoms that were concerning for angina that we would push harder for a Myoview study but at this point we will continue to follow him clinically.  We will anticipate getting echocardiograms every several years to follow his left ventricular function.  If his LV function starts to fall then will need to be more aggressive with management of his PVCs and will send him to letter physiology.  At this point he seems to be doing well.  Continue current dose of  Beta blocker    3.  Hypertensive heart disease without congestive heart failure  We will stop the metoprolol and try carvedilol 25 mg twice a day. See him in 6 months for follow-up visit.  Medication Adjustments/Labs and Tests Ordered: Current medicines are reviewed at length with the patient today.  Concerns regarding medicines are outlined above.  No orders of the defined types were placed in this encounter.  Meds ordered this encounter  Medications  . DISCONTD: carvedilol (COREG) 25 MG tablet    Sig: Take 1 tablet (25 mg total) by mouth 2 (two) times daily.    Dispense:  180 tablet    Refill:  3  . carvedilol (COREG) 25 MG tablet    Sig: Take 1 tablet (25 mg total) by mouth 2 (two) times daily.    Dispense:  180 tablet    Refill:  3    Signed, Kristeen Miss, MD  11/08/2017 11:28 AM    [facility]

## 2017-11-08 NOTE — Addendum Note (Signed)
Addended by: Levi AlandSWINYER, MICHELLE M on: 11/08/2017 01:39 PM   Modules accepted: Orders

## 2017-11-08 NOTE — Patient Instructions (Addendum)
Medication Instructions:  Your physician has recommended you make the following change in your medication:   STOP Metoprolol (Lopressor) START Carvedilol (Coreg) 25 mg twice daily   Labwork: None Ordered   Testing/Procedures: None Ordered   Follow-Up: Your physician wants you to follow-up in: 6 months with Dr. Elease HashimotoNahser. You will receive a reminder letter in the mail two months in advance. If you don't receive a letter, please call our office to schedule the follow-up appointment.   If you need a refill on your cardiac medications before your next appointment, please call your pharmacy.   Thank you for choosing [facility]! Eligha BridegroomMichelle Swinyer, RN [phone]

## 2017-12-14 ENCOUNTER — Telehealth: Payer: Self-pay | Admitting: Cardiovascular Disease

## 2017-12-14 DIAGNOSIS — R0602 Shortness of breath: Secondary | ICD-10-CM

## 2017-12-14 DIAGNOSIS — I119 Hypertensive heart disease without heart failure: Secondary | ICD-10-CM

## 2017-12-14 NOTE — Telephone Encounter (Signed)
New Message    Patient is calling because he is considering have the stress echo that was recommended to him. He would like to discuss the test itself as well as he has some other questions about medication. Please call to discuss. He says that you can reach out to him tomorrow, because he will not available today.

## 2017-12-15 MED ORDER — METOPROLOL TARTRATE 50 MG PO TABS: 50.0000 mg | ORAL_TABLET | Freq: Two times a day (BID) | ORAL | 3 refills | Status: DC

## 2017-12-15 NOTE — Telephone Encounter (Signed)
Its possible that the Coreg is causing more wheezing , bronchospasm compared to metoprolol OK for him to resume his previoius dose of metoprolol and DC Coreg.

## 2017-12-15 NOTE — Telephone Encounter (Signed)
Reviewed Dr. Harvie BridgeNahser's advice with patient. I ordered lexiscan myoview per Dr. Elease HashimotoNahser and advised patient that someone will call him next week to schedule. I advised him to monitor BP after switching medication and to call back with increase in BP. He verbalized understanding and agreement and thanked me for the call.

## 2017-12-15 NOTE — Telephone Encounter (Signed)
Spoke with patient who states he is ready to proceed with lexiscan myoview as recommended by Dr. Elease HashimotoNahser at last ov. He states he would also like advice regarding breathing problems x 4-5 weeks.He states he recently restarted Zoloft and has progressively increased his dose as advised by PCP. He states he was switched from metoprolol to carvedilol after ov on 6/12 with Dr. Elease HashimotoNahser. He states he thinks the SOB worsened after switching to Carvedilol. States he took Metoprolol for many years without any problems. States BP  128/70 at recent MD visit, does not know pulse. He states he took Carvedilol last night and then this morning took metoprolol 50 mg. I advised that Dr. Elease HashimotoNahser wanted better BP control with Carvedilol. I advised I will discuss with Dr. Elease HashimotoNahser and call him back with his advice. Patient verbalized understanding and agreement and thanked me for the call.

## 2017-12-26 ENCOUNTER — Telehealth (HOSPITAL_COMMUNITY): Payer: Self-pay | Admitting: *Deleted

## 2017-12-26 NOTE — Telephone Encounter (Signed)
Patient given detailed instructions per Myocardial Perfusion Study Information Sheet for the test on 01/11/18 at 9:45. Patient notified to arrive 15 minutes early and that it is imperative to arrive on time for appointment to keep from having the test rescheduled.  If you need to cancel or reschedule your appointment, please call the office within 24 hours of your appointment. . Patient verbalized understanding.Daneil DolinSharon S Brooks

## 2018-01-08 ENCOUNTER — Telehealth (HOSPITAL_COMMUNITY): Payer: Self-pay | Admitting: *Deleted

## 2018-01-08 ENCOUNTER — Encounter (HOSPITAL_COMMUNITY): Payer: Medicare Other

## 2018-01-08 NOTE — Telephone Encounter (Signed)
Patient's wife per DPR given detailed instructions per Myocardial Perfusion Study Information Sheet for the test on 01/11/18 at 0945. Patient notified to arrive 15 minutes early and that it is imperative to arrive on time for appointment to keep from having the test rescheduled.  If you need to cancel or reschedule your appointment, please call the office within 24 hours of your appointment. . Patient verbalized understanding.Hesston Hitchens, Adelene IdlerCynthia W

## 2018-01-11 ENCOUNTER — Ambulatory Visit (HOSPITAL_COMMUNITY): Payer: Medicare Other | Attending: Cardiology

## 2018-01-11 DIAGNOSIS — R0602 Shortness of breath: Secondary | ICD-10-CM | POA: Diagnosis present

## 2018-01-11 DIAGNOSIS — I119 Hypertensive heart disease without heart failure: Secondary | ICD-10-CM | POA: Diagnosis present

## 2018-01-11 LAB — MYOCARDIAL PERFUSION IMAGING
CHL CUP NUCLEAR SDS: 0
CHL CUP RESTING HR STRESS: 78 {beats}/min
LHR: 0.3
LV dias vol: 158 mL (ref 62–150)
LVSYSVOL: 91 mL
NUC STRESS TID: 0.87
Peak HR: 82 {beats}/min
SRS: 6
SSS: 6

## 2018-01-11 MED ORDER — TECHNETIUM TC 99M TETROFOSMIN IV KIT
30.8000 | PACK | Freq: Once | INTRAVENOUS | Status: AC | PRN
  Administered 2018-01-11: 30.8 via INTRAVENOUS
  Filled 2018-01-11: qty 31

## 2018-01-11 MED ORDER — TECHNETIUM TC 99M TETROFOSMIN IV KIT
10.9000 | PACK | Freq: Once | INTRAVENOUS | Status: AC | PRN
  Administered 2018-01-11: 10.9 via INTRAVENOUS
  Filled 2018-01-11: qty 11

## 2018-01-11 MED ORDER — REGADENOSON 0.4 MG/5ML IV SOLN
0.4000 mg | Freq: Once | INTRAVENOUS | Status: AC
  Administered 2018-01-11: 0.4 mg via INTRAVENOUS

## 2018-01-17 ENCOUNTER — Telehealth: Payer: Self-pay | Admitting: Nurse Practitioner

## 2018-01-17 NOTE — Telephone Encounter (Signed)
Reviewed results of myoview with patient who verbalized understanding. He states he does not have any symptoms or concerns at this time. He reports that after talking with me and switching from Carvedilol to Metoprolol, he has gone back to taking Carvedilol 25 mg BID. He reports that the problem he thought he was having was due to a different medication and that he only took Metoprolol for 1 week after our conversation on 7/18.  He reports no concerns with medications at present. I updated the patient's medication list. Patient asked me to review test results with his wife which I was happy to oblige. Patient's wife verbalized understanding of results and plan and will call back to soon to schedule patient's 6 month f/u. She thanked me for the call.

## 2018-01-17 NOTE — Progress Notes (Unsigned)
Reviewed results of myoview with patient who verbalized understanding. He states he does not have any symptoms or concerns at this time. He reports that after talking with me and switching from Carvedilol to Metoprolol, he has gone back to taking Carvedilol 25 mg BID. He reports that the problem he thought he was having was due to a different medication and that he only took Metoprolol for 1 week after our conversation on 7/18.  He reports no concerns with medications at present. I updated the patient's medication list. Patient asked me to review test results with his wife which I was happy to oblige. Patient's wife verbalized understanding of results and plan and will call back to soon to schedule patient's 6 month f/u. She thanked me for the call.   

## 2018-01-17 NOTE — Telephone Encounter (Signed)
-----   Message from Pricilla RifflePaula Ross V, MD sent at 01/12/2018  1:06 AM EDT ----- No evid of ischemia.or significant scar Probable soft tissue attenuation.   Calculated pumping function may be inaccurate  At 42%  Normal in march Will forward to P Nahser to review

## 2018-05-16 ENCOUNTER — Ambulatory Visit (INDEPENDENT_AMBULATORY_CARE_PROVIDER_SITE_OTHER): Payer: Medicare Other | Admitting: Cardiovascular Disease

## 2018-05-16 ENCOUNTER — Encounter: Payer: Self-pay | Admitting: Cardiovascular Disease

## 2018-05-16 VITALS — BP 126/80 | HR 74 | Ht 72.0 in | Wt 195.4 lb

## 2018-05-16 DIAGNOSIS — I5032 Chronic diastolic (congestive) heart failure: Secondary | ICD-10-CM | POA: Insufficient documentation

## 2018-05-16 DIAGNOSIS — I5033 Acute on chronic diastolic (congestive) heart failure: Secondary | ICD-10-CM | POA: Insufficient documentation

## 2018-05-16 DIAGNOSIS — I5043 Acute on chronic combined systolic (congestive) and diastolic (congestive) heart failure: Secondary | ICD-10-CM | POA: Insufficient documentation

## 2018-05-16 DIAGNOSIS — I10 Essential (primary) hypertension: Secondary | ICD-10-CM | POA: Diagnosis not present

## 2018-05-16 HISTORY — DX: Chronic diastolic (congestive) heart failure: I50.32

## 2018-05-16 NOTE — Progress Notes (Signed)
Cardiology Office Note:    Date:  05/16/2018   ID:  Brian Ruiz, DOB [birth year], MRN [id number]  PCP:  Tracey Harries, MD  Cardiologist:  Kristeen Miss, MD   Referring MD: Tracey Harries, MD   1.  Hypertension 2.  Premature ventricular contractions 3.  Anxiety  Chief Complaint  Patient presents with  . Hypertension  . Palpitations     July 28, 2017   Brian Ruiz is a 72 y.o. male with a hx of palpitations Seen with wife , Alvino Chapel. ( saw her last year for palps )    Has had palpitations for years,  Off and on.  Saw Dr. Everlene Other recently for fatigue and slow HR ( on his home monitor, was 40s)  No dyspnea, no cough, no fever, no CP ,nio PND or orthopnea  Has had sensation of being hot and then cold  His palpitations for years  Has had orthostatic hypotension Has been diagnosed with SVT.  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   Has had numerous injections   Had an abnormal stress test in the past.   ( false positive) .  Led to a heart cath  Had a cath with Dr. Katrinka Blazing ( 1996 ) which was normal .   Worked in [employer] and then [employer].    November 08, 2017 :  Brian Ruiz is seen back today  Holter monitor revealed significant number of premature ventricular contractions.  He has a PVC burden of 15%. Echo showed normal left ventricular systolic function.  He has grade 1 diastolic dysfunction, mild mitral regurgitation and mild aortic insufficiency. We increased the metoprolol to 50 BID  We had suggested a myoview.   He declined the stress test because of back issues.   We discussed the fact that we could do a chemical stress test but he commented that he has had a chemical stress test in the past which was a false positive.  This led to heart catheterization which was ultimately negative and showed normal coronary arteries.  May 16, 2018:  Brian Ruiz is seen today for follow-up of his premature ventricular contractions and hypertension. Overall is doing well  Needs to exercise ( spinal therapist )    Past Medical History:  Diagnosis Date  . Arthritis   . 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 Heart murmur   . Hypertension   . Inguinal hernia   . Polyneuropathy   . S/P thyroidectomy 1967  . Scoliosis   . Urinary retention     Past Surgical History:  Procedure Laterality Date  . BACK SURGERY  1975   lumbx2  . COLONOSCOPY    . FOOT SURGERY  1985, 2000   due to arthritis  . HAND SURGERY  2005   left - due to arthritis  . INGUINAL HERNIA REPAIR  04/30/2012   Procedure: HERNIA REPAIR INGUINAL ADULT;  Surgeon: Currie Paris, MD;  Location: [facility];  Service: General;  Laterality: Left;  repair left inguinal hernia  . THYROIDECTOMY  1967  . TONSILLECTOMY      Current Medications: Current Meds  Medication Sig  . acetaminophen (TYLENOL) 500 MG  tablet Take 1,000 mg by mouth every 8 (eight) hours as needed. For pain.   . carvedilol (COREG) 25 MG tablet Take 25 mg by mouth 2 (two) times daily with a meal.  . sertraline (ZOLOFT) 25 MG tablet Take 25 mg by mouth 3 (three) times daily.     Allergies:   Aspirin; Nsaids; Oxycodone; Tolmetin; Tramadol; and Codeine   Social History   Socioeconomic History  . Marital status: Married    Spouse name: Not on file  . Number of children: Not on file  . Years of education: Not on file  . Highest education level: Not on file  Occupational History  . Not on file  Social Needs  . Financial resource strain: Not on file  . Food insecurity:    Worry: Not on file    Inability: Not on file  . Transportation needs:    Medical: Not on file    Non-medical: Not on file  Tobacco Use  . Smoking status: Never Smoker  . Smokeless  tobacco: Never Used  Substance and Sexual Activity  . Alcohol use: No  . Drug use: No  . Sexual activity: Yes    Partners: Female  Lifestyle  . Physical activity:    Days per week: Not on file    Minutes per session: Not on file  . Stress: Not on file  Relationships  . Social connections:    Talks on phone: Not on file    Gets together: Not on file    Attends religious service: Not on file    Active member of club or organization: Not on file    Attends meetings of clubs or organizations: Not on file    Relationship status: Not on file  Other Topics Concern  . Not on file  Social History Narrative   Quit ETOH > 20years ago   Disabled due to back pain   Non-smoker   Lives at home   Wife Elenaor   Son +     Family History: The patient's family history includes Heart disease in his father.  ROS:   Please see the history of present illness.     All other systems reviewed and are negative.  EKGs/Labs/Other Studies Reviewed:    The following studies were reviewed today:   EKG:  July 28, 2017:   NSR at 80 , marked sinus arrhythmia.  Frequent PVCs Otherwise normal   Recent Labs: 08/14/2017: BUN 14; Creatinine, Ser 0.96; Potassium 4.8; Sodium 129  Recent Lipid Panel No results found for: CHOL, TRIG, HDL, CHOLHDL, VLDL, LDLCALC, LDLDIRECT  Physical Exam:     Physical Exam: Blood pressure 126/80, pulse 74, height 6' (1.829 m), weight 195 lb 6.4 oz (88.6 kg), SpO2 98 %.  GEN:  Well nourished, well developed in no acute distress HEENT: Normal NECK: No JVD; No carotid bruits LYMPHATICS: No lymphadenopathy CARDIAC: RRR , no murmurs, rubs, gallops RESPIRATORY:  Clear to auscultation without rales, wheezing or rhonchi  ABDOMEN: Soft, non-tender, non-distended MUSCULOSKELETAL:  No edema; No deformity  SKIN: Warm and dry NEUROLOGIC:  Alert and oriented x 3   ASSESSMENT:    1. Essential hypertension   2. Chronic diastolic CHF (congestive heart failure) (HCC)     PLAN:       1. Palpitations:    Brian Ruiz's palpitations seem to be gradually better.  She will has occasional PVCs on exam but overall these seem to be fairly well controlled.  2.  Ventricular bigeminy:  Still has frequent premature ventricular contractions or PACs.  No bigeminy on exam today.   3.  Hypertensive heart disease without congestive heart failure  Stable.   Medication Adjustments/Labs and Tests Ordered: Current medicines are reviewed at length with the patient today.  Concerns regarding medicines are outlined above.  No orders of the defined types were placed in this encounter.  No orders of the defined types were placed in this encounter.   Signed, Kristeen MissPhilip Nahser, MD  05/16/2018 6:43 PM    [facility]

## 2018-05-16 NOTE — Patient Instructions (Addendum)
[facility] [phone]  Medication Instructions:  Your physician recommends that you continue on your current medications as directed. Please refer to the Current Medication list given to you today.  If you need a refill on your cardiac medications before your next appointment, please call your pharmacy.    Lab work: None Ordered    Testing/Procedures: None Ordered   Follow-Up: At [facility], you and your health needs are our priority.  As part of our continuing mission to provide you with exceptional heart care, we have created designated Provider Care Teams.  These Care Teams include your primary Cardiologist (physician) and Advanced Practice Providers (APPs -  Physician Assistants and Nurse Practitioners) who all work together to provide you with the care you need, when you need it. You will need a follow up appointment in:  1 years.  Please call our office 2 months in advance to schedule this appointment.  You may see Kristeen MissPhilip Treylan Mcclintock, MD or one of the following Advanced Practice Providers on your designated Care Team: Tereso NewcomerScott Weaver, PA-C Vin RivertonBhagat, [place] JerseyPA-C . Berton BonJanine Hammond, NP

## 2018-10-28 ENCOUNTER — Other Ambulatory Visit: Payer: Self-pay | Admitting: Cardiovascular Disease

## 2019-01-23 ENCOUNTER — Other Ambulatory Visit: Payer: Self-pay | Admitting: Cardiovascular Disease

## 2019-06-13 DIAGNOSIS — K921 Melena: Secondary | ICD-10-CM | POA: Insufficient documentation

## 2019-08-10 ENCOUNTER — Other Ambulatory Visit: Payer: Self-pay | Admitting: Cardiovascular Disease

## 2020-11-10 ENCOUNTER — Other Ambulatory Visit: Payer: Self-pay

## 2020-11-10 ENCOUNTER — Emergency Department (HOSPITAL_COMMUNITY): Payer: Medicare Other

## 2020-11-10 ENCOUNTER — Encounter (HOSPITAL_COMMUNITY): Payer: Self-pay

## 2020-11-10 ENCOUNTER — Inpatient Hospital Stay (HOSPITAL_COMMUNITY): Payer: Medicare Other

## 2020-11-10 ENCOUNTER — Inpatient Hospital Stay (HOSPITAL_COMMUNITY)
Admission: EM | Admit: 2020-11-10 | Discharge: 2020-11-15 | DRG: 417 | Disposition: A | Discharge status: Home Health | Payer: Medicare Other | Attending: Internal Medicine | Admitting: Internal Medicine

## 2020-11-10 DIAGNOSIS — K819 Cholecystitis, unspecified: Secondary | ICD-10-CM | POA: Diagnosis not present

## 2020-11-10 DIAGNOSIS — K859 Acute pancreatitis without necrosis or infection, unspecified: Secondary | ICD-10-CM | POA: Diagnosis present

## 2020-11-10 DIAGNOSIS — Z885 Allergy status to narcotic agent status: Secondary | ICD-10-CM

## 2020-11-10 DIAGNOSIS — I10 Essential (primary) hypertension: Secondary | ICD-10-CM | POA: Diagnosis present

## 2020-11-10 DIAGNOSIS — K219 Gastro-esophageal reflux disease without esophagitis: Secondary | ICD-10-CM | POA: Diagnosis present

## 2020-11-10 DIAGNOSIS — G8929 Other chronic pain: Secondary | ICD-10-CM | POA: Diagnosis present

## 2020-11-10 DIAGNOSIS — R945 Abnormal results of liver function studies: Secondary | ICD-10-CM | POA: Diagnosis not present

## 2020-11-10 DIAGNOSIS — E871 Hypo-osmolality and hyponatremia: Secondary | ICD-10-CM | POA: Diagnosis present

## 2020-11-10 DIAGNOSIS — G609 Hereditary and idiopathic neuropathy, unspecified: Secondary | ICD-10-CM | POA: Diagnosis present

## 2020-11-10 DIAGNOSIS — E861 Hypovolemia: Secondary | ICD-10-CM | POA: Diagnosis present

## 2020-11-10 DIAGNOSIS — Z419 Encounter for procedure for purposes other than remedying health state, unspecified: Secondary | ICD-10-CM

## 2020-11-10 DIAGNOSIS — Z8711 Personal history of peptic ulcer disease: Secondary | ICD-10-CM | POA: Diagnosis not present

## 2020-11-10 DIAGNOSIS — Z8249 Family history of ischemic heart disease and other diseases of the circulatory system: Secondary | ICD-10-CM | POA: Diagnosis not present

## 2020-11-10 DIAGNOSIS — F1010 Alcohol abuse, uncomplicated: Secondary | ICD-10-CM | POA: Diagnosis present

## 2020-11-10 DIAGNOSIS — I493 Ventricular premature depolarization: Secondary | ICD-10-CM | POA: Diagnosis present

## 2020-11-10 DIAGNOSIS — K5900 Constipation, unspecified: Secondary | ICD-10-CM | POA: Diagnosis not present

## 2020-11-10 DIAGNOSIS — K8012 Calculus of gallbladder with acute and chronic cholecystitis without obstruction: Principal | ICD-10-CM | POA: Diagnosis present

## 2020-11-10 DIAGNOSIS — E890 Postprocedural hypothyroidism: Secondary | ICD-10-CM | POA: Diagnosis present

## 2020-11-10 DIAGNOSIS — Z79899 Other long term (current) drug therapy: Secondary | ICD-10-CM | POA: Diagnosis not present

## 2020-11-10 DIAGNOSIS — Z20822 Contact with and (suspected) exposure to covid-19: Secondary | ICD-10-CM | POA: Diagnosis present

## 2020-11-10 DIAGNOSIS — K8050 Calculus of bile duct without cholangitis or cholecystitis without obstruction: Secondary | ICD-10-CM | POA: Insufficient documentation

## 2020-11-10 DIAGNOSIS — Z87891 Personal history of nicotine dependence: Secondary | ICD-10-CM | POA: Diagnosis not present

## 2020-11-10 DIAGNOSIS — K851 Biliary acute pancreatitis without necrosis or infection: Secondary | ICD-10-CM | POA: Diagnosis present

## 2020-11-10 DIAGNOSIS — Z9049 Acquired absence of other specified parts of digestive tract: Secondary | ICD-10-CM | POA: Diagnosis not present

## 2020-11-10 DIAGNOSIS — Z9181 History of falling: Secondary | ICD-10-CM | POA: Diagnosis not present

## 2020-11-10 DIAGNOSIS — Z886 Allergy status to analgesic agent status: Secondary | ICD-10-CM | POA: Diagnosis not present

## 2020-11-10 DIAGNOSIS — K838 Other specified diseases of biliary tract: Secondary | ICD-10-CM | POA: Diagnosis not present

## 2020-11-10 DIAGNOSIS — E860 Dehydration: Secondary | ICD-10-CM | POA: Diagnosis present

## 2020-11-10 LAB — CBC WITH DIFFERENTIAL/PLATELET
Abs Immature Granulocytes: 0.08 10*3/uL — ABNORMAL HIGH (ref 0.00–0.07)
Basophils Absolute: 0 10*3/uL (ref 0.0–0.1)
Basophils Relative: 0 %
Eosinophils Absolute: 0 10*3/uL (ref 0.0–0.5)
Eosinophils Relative: 0 %
HCT: 37.7 % — ABNORMAL LOW (ref 39.0–52.0)
Hemoglobin: 12.5 g/dL — ABNORMAL LOW (ref 13.0–17.0)
Immature Granulocytes: 0 %
Lymphocytes Relative: 2 %
Lymphs Abs: 0.4 10*3/uL — ABNORMAL LOW (ref 0.7–4.0)
MCH: 31.3 pg (ref 26.0–34.0)
MCHC: 33.2 g/dL (ref 30.0–36.0)
MCV: 94.5 fL (ref 80.0–100.0)
Monocytes Absolute: 1.3 10*3/uL — ABNORMAL HIGH (ref 0.1–1.0)
Monocytes Relative: 6 %
Neutro Abs: 18 10*3/uL — ABNORMAL HIGH (ref 1.7–7.7)
Neutrophils Relative %: 92 %
Platelets: 169 10*3/uL (ref 150–400)
RBC: 3.99 MIL/uL — ABNORMAL LOW (ref 4.22–5.81)
RDW: 13.2 % (ref 11.5–15.5)
WBC: 19.8 10*3/uL — ABNORMAL HIGH (ref 4.0–10.5)
nRBC: 0 % (ref 0.0–0.2)

## 2020-11-10 LAB — BASIC METABOLIC PANEL
Anion gap: 8 (ref 5–15)
BUN: 17 mg/dL (ref 8–23)
CO2: 24 mmol/L (ref 22–32)
Calcium: 9.2 mg/dL (ref 8.9–10.3)
Chloride: 90 mmol/L — ABNORMAL LOW (ref 98–111)
Creatinine, Ser: 1.04 mg/dL (ref 0.61–1.24)
GFR, Estimated: >60 mL/min (ref >60)
Glucose, Bld: 114 mg/dL — ABNORMAL HIGH (ref 70–99)
Potassium: 4 mmol/L (ref 3.5–5.1)
Sodium: 122 mmol/L — ABNORMAL LOW (ref 135–145)

## 2020-11-10 LAB — RESP PANEL BY RT-PCR (FLU A&B, COVID) ARPGX2
Influenza A by PCR: NEGATIVE
Influenza B by PCR: NEGATIVE
SARS Coronavirus 2 by RT PCR: NEGATIVE

## 2020-11-10 LAB — HEPATIC FUNCTION PANEL
ALT: 61 U/L — ABNORMAL HIGH (ref 0–44)
AST: 78 U/L — ABNORMAL HIGH (ref 15–41)
Albumin: 3.4 g/dL — ABNORMAL LOW (ref 3.5–5.0)
Alkaline Phosphatase: 45 U/L (ref 38–126)
Bilirubin, Direct: 0.4 mg/dL — ABNORMAL HIGH (ref 0.0–0.2)
Indirect Bilirubin: 1.7 mg/dL — ABNORMAL HIGH (ref 0.3–0.9)
Total Bilirubin: 2.1 mg/dL — ABNORMAL HIGH (ref 0.3–1.2)
Total Protein: 6.5 g/dL (ref 6.5–8.1)

## 2020-11-10 LAB — LIPASE, BLOOD: Lipase: 379 U/L — ABNORMAL HIGH (ref 11–51)

## 2020-11-10 LAB — LACTIC ACID, PLASMA: Lactic Acid, Venous: 1.1 mmol/L (ref 0.5–1.9)

## 2020-11-10 MED ORDER — LACTATED RINGERS IV SOLN: INTRAVENOUS | Status: AC

## 2020-11-10 MED ORDER — SODIUM CHLORIDE 0.9 % IV SOLN
1.0000 g | Freq: Once | INTRAVENOUS | Status: DC
  Filled 2020-11-10: qty 1

## 2020-11-10 MED ORDER — IOHEXOL 300 MG/ML  SOLN
100.0000 mL | Freq: Once | INTRAMUSCULAR | Status: AC | PRN
  Administered 2020-11-10: 100 mL via INTRAVENOUS

## 2020-11-10 MED ORDER — FENTANYL CITRATE (PF) 100 MCG/2ML IJ SOLN
25.0000 ug | INTRAMUSCULAR | Status: DC | PRN
  Administered 2020-11-10 – 2020-11-11 (×4): 25 ug via INTRAVENOUS
  Filled 2020-11-10 (×4): qty 2

## 2020-11-10 MED ORDER — GADOBUTROL 1 MMOL/ML IV SOLN
8.8000 mL | Freq: Once | INTRAVENOUS | Status: AC | PRN
  Administered 2020-11-10: 8.8 mL via INTRAVENOUS

## 2020-11-10 MED ORDER — SODIUM CHLORIDE 0.9 % IV SOLN
2.0000 g | Freq: Once | INTRAVENOUS | Status: AC
  Administered 2020-11-10: 2 g via INTRAVENOUS
  Filled 2020-11-10: qty 2

## 2020-11-10 MED ORDER — SODIUM CHLORIDE 0.9 % IV BOLUS (SEPSIS)
500.0000 mL | Freq: Once | INTRAVENOUS | Status: AC
  Administered 2020-11-10: 500 mL via INTRAVENOUS

## 2020-11-10 MED ORDER — SODIUM CHLORIDE 0.9 % IV SOLN
1000.0000 mL | INTRAVENOUS | Status: DC
  Administered 2020-11-10: 1000 mL via INTRAVENOUS

## 2020-11-10 MED ORDER — METRONIDAZOLE 500 MG/100ML IV SOLN
500.0000 mg | Freq: Once | INTRAVENOUS | Status: AC
  Administered 2020-11-10: 500 mg via INTRAVENOUS
  Filled 2020-11-10: qty 100

## 2020-11-10 MED ORDER — FENTANYL CITRATE (PF) 100 MCG/2ML IJ SOLN
50.0000 ug | Freq: Once | INTRAMUSCULAR | Status: AC
Start: 2020-11-10 — End: 2020-11-10
  Administered 2020-11-10: 50 ug via INTRAVENOUS
  Filled 2020-11-10: qty 2

## 2020-11-10 MED ORDER — LABETALOL HCL 5 MG/ML IV SOLN: 10.0000 mg | INTRAVENOUS | Status: DC | PRN

## 2020-11-10 NOTE — ED Triage Notes (Signed)
Pt BIB [facility] from home w/2 falls today and increased generalized weakness. Usually ambulatory w/no assistance but has an unsteady gait. Chills today and yesterday. Pt hit his head on the wall, not on thinners, EMS noted a small dent to wall.  Received 500 cc NS PTA  18g LFA  BP 114/64 HR 81 94% 2L [place]  CBG 112 97.3 feels warm to touch

## 2020-11-10 NOTE — ED Notes (Signed)
Pt returned from CT °

## 2020-11-10 NOTE — H&P (Addendum)
History and Physical    Brian Ruiz WUJ:[id number] DOB: [birth year] DOA: 11/10/2020  PCP: Tracey Harries, MD  Patient coming from: Home.  Chief Complaint: Fall.  HPI: Brian Ruiz is a 75 y.o. male with history of hypertension PVCs and peptic ulcer disease who has had GI bleed previously seen by gastroenterologist and colonoscopy last EGD was in January 2021 has had a fall today and was brought to the ER.  Patient had 2 falls 1 at 4 AM but at the time EMS advised coming to ER but patient refused.  Subsequent which patient had another fall at around 11 AM and was brought to the ER.  The exact circumstances of the fall is not clear.  Patient did not lose consciousness.  Denies any chest pain or shortness of breath has been having abdominal pain for the last few months with some nausea food increasing the pain.  Denies diarrhea fever or chills.  ED Course: In the ER EKG shows normal sinus rhythm.  COVID test was negative.  Since patient complained of abdominal pain labs were done which showed elevated LFTs and lipase.  Bilirubin also was elevated.  CT abdomen pelvis was done which shows features concerning for acute cholecystitis with choledocholithiasis and pancreatitis.  ER physician started patient on fluids and empiric antibiotics and admitted for further management of acute pancreatitis likely biliary in origin.  Patient denies drinking alcohol.  Labs otherwise showed WBC of 19.8 hemoglobin 12.5.  Lactic acid was normal.  Review of Systems: As per HPI, rest all negative.   Past Medical History:  Diagnosis Date   Arthritis    Back pain, chronic    intolerant to narcotics, avoids NSAIDs due to vioxx related bleed,  sees pain clinic as [place], prior eval by [facility] neuro in [place]   Heart murmur    Hypertension    Inguinal hernia    Polyneuropathy    S/P thyroidectomy 1967   Scoliosis    Urinary retention     Past Surgical History:  Procedure Laterality Date   BACK SURGERY  1975    [id number]   COLONOSCOPY     FOOT SURGERY  1985, 2000   due to arthritis   HAND SURGERY  2005   left - due to arthritis   INGUINAL HERNIA REPAIR  04/30/2012   Procedure: HERNIA REPAIR INGUINAL ADULT;  Surgeon: Currie Paris, MD;  Location: [facility];  Service: General;  Laterality: Left;  repair left inguinal hernia   THYROIDECTOMY  1967   TONSILLECTOMY       reports that he has never smoked. He has never used smokeless tobacco. He reports that he does not drink alcohol and does not use drugs.  Allergies  Allergen Reactions   Aspirin Shortness Of Breath    Other reaction(s): Bronchospasm (ALLERGY/intolerance)   Nsaids Shortness Of Breath    Other reaction(s): Bronchospasm (ALLERGY/intolerance)   Oxycodone Other (See Comments)    Agitation. Per the any pain medication that has codeine in it causes that reaction. Other reaction(s): Other Agitation. Per the any pain medication that has codeine in it causes that reaction.   Tolmetin Shortness Of Breath   Tramadol Anaphylaxis   Codeine Anxiety    Other reaction(s): Bronchospasm (ALLERGY/intolerance), Other (See Comments) Irritable     Family History  Problem Relation Age of Onset   Heart disease Father     Prior to Admission medications   Medication Sig Start Date End Date Taking? Authorizing Provider  acetaminophen (TYLENOL) 500 MG tablet Take 1,000 mg by mouth every 8 (eight) hours as needed. For pain.     [provider]  carvedilol (COREG) 25 MG tablet Take 1 tablet (25 mg total) by mouth 2 (two) times daily. Please make overdue appt with Dr. Elease Hashimoto before anymore refills. 1st attempt 08/12/19   Nahser, Deloris Ping, MD  sertraline (ZOLOFT) 25 MG tablet Take 25 mg by mouth 3 (three) times daily.    [provider]    Physical Exam: Constitutional: Moderately built and nourished. Vitals:   11/10/20 1830 11/10/20 1845 11/10/20 1900 11/10/20 2015  BP: (!) 151/77 (!) 146/76 (!) 146/79 108/66   Pulse: 72 72 71 73  Resp: (!) 26 (!) 25 (!) 25 (!) 27  Temp:      TempSrc:      SpO2: 95% 97% 95% 96%   Eyes: Anicteric no pallor. ENMT: No discharge from the ears eyes nose and mouth. Neck: No mass felt.  No neck rigidity. Respiratory: No rhonchi or crepitations. Cardiovascular: S1-S2 heard. Abdomen: Epigastric and right upper quadrant tenderness no guarding or rigidity. Musculoskeletal: No edema. Skin: No rash. Neurologic: Alert awake oriented time place and person.  Moves all extremities. Psychiatric: Appears normal.  Normal affect.   Labs on Admission: I have personally reviewed following labs and imaging studies  CBC: Recent Labs  Lab 11/10/20 1518  WBC 19.8*  NEUTROABS 18.0*  HGB 12.5*  HCT 37.7*  MCV 94.5  PLT 169   Basic Metabolic Panel: Recent Labs  Lab 11/10/20 1518  NA 122*  K 4.0  CL 90*  CO2 24  GLUCOSE 114*  BUN 17  CREATININE 1.04  CALCIUM 9.2   GFR: CrCl cannot be calculated (Unknown ideal weight.). Liver Function Tests: Recent Labs  Lab 11/10/20 1631  AST 78*  ALT 61*  ALKPHOS 45  BILITOT 2.1*  PROT 6.5  ALBUMIN 3.4*   Recent Labs  Lab 11/10/20 1631  LIPASE 379*   No results for input(s): AMMONIA in the last 168 hours. Coagulation Profile: No results for input(s): INR, PROTIME in the last 168 hours. Cardiac Enzymes: No results for input(s): CKTOTAL, CKMB, CKMBINDEX, TROPONINI in the last 168 hours. BNP (last 3 results) No results for input(s): PROBNP in the last 8760 hours. HbA1C: No results for input(s): HGBA1C in the last 72 hours. CBG: No results for input(s): GLUCAP in the last 168 hours. Lipid Profile: No results for input(s): CHOL, HDL, LDLCALC, TRIG, CHOLHDL, LDLDIRECT in the last 72 hours. Thyroid Function Tests: No results for input(s): TSH, T4TOTAL, FREET4, T3FREE, THYROIDAB in the last 72 hours. Anemia Panel: No results for input(s): VITAMINB12, FOLATE, FERRITIN, TIBC, IRON, RETICCTPCT in the last 72  hours. Urine analysis:    Component Value Date/Time   COLORURINE YELLOW 05/15/2011 1151   APPEARANCEUR CLEAR 05/15/2011 1151   LABSPEC 1.008 05/15/2011 1151   PHURINE 7.0 05/15/2011 1151   GLUCOSEU NEGATIVE 05/15/2011 1151   HGBUR LARGE (A) 05/15/2011 1151   BILIRUBINUR NEGATIVE 05/15/2011 1151   KETONESUR NEGATIVE 05/15/2011 1151   PROTEINUR NEGATIVE 05/15/2011 1151   UROBILINOGEN 0.2 05/15/2011 1151   NITRITE NEGATIVE 05/15/2011 1151   LEUKOCYTESUR TRACE (A) 05/15/2011 1151   Sepsis Labs: (procalcitonin:4,lacticidven:4) ) Recent Results (from the past 240 hour(s))  Resp Panel by RT-PCR (Flu A&B, Covid) Nasopharyngeal Swab     Status: None   Collection Time: 11/10/20  6:29 PM   Specimen: Nasopharyngeal Swab; Nasopharyngeal(NP) swabs in vial transport medium  Result Value Ref  Range Status   SARS Coronavirus 2 by RT PCR NEGATIVE NEGATIVE Final    Comment: (NOTE) SARS-CoV-2 target nucleic acids are NOT DETECTED.  The SARS-CoV-2 RNA is generally detectable in upper respiratory specimens during the acute phase of infection. The lowest concentration of SARS-CoV-2 viral copies this assay can detect is 138 copies/mL. A negative result does not preclude SARS-Cov-2 infection and should not be used as the sole basis for treatment or other patient management decisions. A negative result may occur with  improper specimen collection/handling, submission of specimen other than nasopharyngeal swab, presence of viral mutation(s) within the areas targeted by this assay, and inadequate number of viral copies(<138 copies/mL). A negative result must be combined with clinical observations, patient history, and epidemiological information. The expected result is Negative.  Fact Sheet for Patients:  BloggerCourse.comhttps://www.fda.gov/media/152166/download  Fact Sheet for Healthcare Providers:  SeriousBroker.ithttps://www.fda.gov/media/152162/download  This test is no t yet approved or cleared by the [place] FDA and  has been authorized for detection and/or diagnosis of SARS-CoV-2 by FDA under an Emergency Use Authorization (EUA). This EUA will remain  in effect (meaning this test can be used) for the duration of the COVID-19 declaration under Section 564(b)(1) of the Act, 21 U.S.C.section 360bbb-3(b)(1), unless the authorization is terminated  or revoked sooner.       Influenza A by PCR NEGATIVE NEGATIVE Final   Influenza B by PCR NEGATIVE NEGATIVE Final    Comment: (NOTE) The Xpert Xpress SARS-CoV-2/FLU/RSV plus assay is intended as an aid in the diagnosis of influenza from Nasopharyngeal swab specimens and should not be used as a sole basis for treatment. Nasal washings and aspirates are unacceptable for Xpert Xpress SARS-CoV-2/FLU/RSV testing.  Fact Sheet for Patients: BloggerCourse.comhttps://www.fda.gov/media/152166/download  Fact Sheet for Healthcare Providers: SeriousBroker.ithttps://www.fda.gov/media/152162/download  This test is not yet approved or cleared by the [place] FDA and has been authorized for detection and/or diagnosis of SARS-CoV-2 by FDA under an Emergency Use Authorization (EUA). This EUA will remain in effect (meaning this test can be used) for the duration of the COVID-19 declaration under Section 564(b)(1) of the Act, 21 U.S.C. section 360bbb-3(b)(1), unless the authorization is terminated or revoked.  Performed at [facility], [address] [place], [place] [address]      Radiological Exams on Admission: DG Ribs Unilateral W/Chest Right  Result Date: 11/10/2020 CLINICAL DATA:  Fall, generalized weakness EXAM: RIGHT RIBS AND CHEST - 3+ VIEW COMPARISON:  Radiograph 05/15/2011 FINDINGS: Unchanged, chronically elevated right hemidiaphragm with right basilar atelectasis. No new focal airspace consolidation. No visible pneumothorax unchanged cardiomediastinal silhouette with aortic calcification. There is no evidence of displaced rib fracture. IMPRESSION: No evidence of  displaced rib fracture. Unchanged, chronically elevated right hemidiaphragm with right basilar atelectasis. Electronically Signed   By: Caprice RenshawJacob  Kahn   On: 11/10/2020 15:43   CT ABDOMEN PELVIS W CONTRAST  Result Date: 11/10/2020 CLINICAL DATA:  Multiple falls today. Increased weakness. Chills today and yesterday. EXAM: CT ABDOMEN AND PELVIS WITH CONTRAST TECHNIQUE: Multidetector CT imaging of the abdomen and pelvis was performed using the standard protocol following bolus administration of intravenous contrast. CONTRAST:  100mL OMNIPAQUE IOHEXOL 300 MG/ML  SOLN COMPARISON:  None. FINDINGS: Lower chest: Atelectasis or consolidation in the right lung base. No effusion. Elevation of the right hemidiaphragm. Hepatobiliary: Subcentimeter low-attenuation lesions in the lateral segment left lobe of liver, likely cysts. The gallbladder is distended with mild gallbladder wall thickening and edema. Mild extrahepatic bile duct dilatation. Suggestion of small stones in  the gallbladder. No definitive common duct stones. Appearance suggest cholecystitis. Pancreas: Pancreas is mildly enlarged with mild pancreatic ductal dilatation. Inflammation in the peripancreatic fat. Prominence of the head of the pancreas. Changes likely represent early acute pancreatitis. No collections are identified. Follow-up after resolution of acute process is recommended to exclude underlying pancreatic head mass. Spleen: Normal in size without focal abnormality. Adrenals/Urinary Tract: Adrenal glands are unremarkable. Kidneys are normal, without renal calculi, focal lesion, or hydronephrosis. Bladder is diffusely distended with irregular and trabeculated bladder wall and multiple small bladder diverticula. Changes suggest chronic outlet obstruction. No wall thickening. Stomach/Bowel: Stomach, small bowel, and colon are not abnormally distended although there is scattered fluid in the small bowel. No wall thickening or inflammatory changes are  appreciated. Scattered stool in the colon. Scattered colonic diverticula without evidence of diverticulitis. Appendix is not identified. Vascular/Lymphatic: Aortic atherosclerosis. No enlarged abdominal or pelvic lymph nodes. Reproductive: Prostate gland is enlarged, measuring 5.8 cm diameter. Other: No free air or free fluid in the abdomen. Abdominal wall musculature appears intact. Musculoskeletal: Degenerative changes in the spine and hips. Lumbar scoliosis convex towards the left. IMPRESSION: 1. Distended gallbladder with mild edema and small stones. Bile duct dilatation. Changes may represent cholecystitis or biliary obstruction. 2. Inflammatory changes involving the pancreas with peripancreatic edema likely representing acute pancreatitis. There is fullness in the head of the pancreas and follow-up after resolution of acute process is recommended to exclude underlying pancreatic mass. 3. Enlarged prostate gland with distended bladder. 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 4. Atelectasis or consolidation in the right lung base. Electronically Signed   By: Burman Nieves M.D.   On: 11/10/2020 19:49   DG Hip Unilat W or Wo Pelvis 2-3 Views Right  Result Date: 11/10/2020 CLINICAL DATA:  Fall and pain. EXAM: DG HIP (WITH OR WITHOUT PELVIS) 2-3V RIGHT COMPARISON:  None. FINDINGS: AP view of the pelvis and AP/frog leg views of the right hip. Femoral heads are located. Marked osteoarthritis involving the weight-bearing surface of the right hip. No acute fracture. Mild osteopenia. IMPRESSION: Marked right hip osteoarthritis, without acute superimposed process. Given the severity of osteopenia, if there is a strong clinical concern of fracture, consider further evaluation with MRI. Electronically Signed   By: Jeronimo Greaves M.D.   On: 11/10/2020 17:13    EKG: Independently reviewed.  Normal sinus rhythm.  Assessment/Plan Principal Problem:    Acute pancreatitis Active Problems:   Hypertension    Acute pancreatitis likely biliary in origin for which at this time we will get MRCP keep patient n.p.o.  IV fluids pain medication.  GI consult. Possible acute cholecystitis presently NPO on antibiotics.  General surgery consult. Severe hyponatremia could be due to dehydration.  We will gently hydrate follow metabolic panel, TSH and closely check urine studies for further assessing the cause for the hyponatremia. History hypertension PVC for now we will keep patient on as needed IV labetalol and if blood pressure allows we will keep patient on scheduled dose of IV metoprolol. Anemia with history of peptic ulcer disease follow CBC.  Since patient has acute pancreatitis with possible choledocholithiasis and cholecystitis will need close monitoring and further work-up and inpatient status.   DVT prophylaxis: SCDs avoiding anticoagulation in anticipation of possible procedure. Code Status: Full code. Family Communication: Patient's daughter. Disposition Plan: Home when stable. Consults called: GI.  Will consult general surgery. Admission status: Inpatient.   Eduard Clos MD [facility] Pager [phone].  If 7PM-7AM, please contact night-coverage [email or web address] Password [facility]  11/10/2020, 9:32 PM

## 2020-11-10 NOTE — ED Provider Notes (Signed)
[facility] Provider Note   CSN: [id number] Arrival date & time: 11/10/20  1403     History Chief Complaint  Patient presents with  . Fall    Brian Ruiz is a 75 y.o. male.   Fall   Patient presented to the ED for evaluation after fall today.  Patient states he does have history of some difficulties with his gait due to some chronic issues with his hip and back.  Patient states he lost his balance earlier today and fell.  He was unable to get up on his own and they had to call EMS.  Patient was able to get up and walk around after that but then he had a second fall today.  Patient has felt somewhat chilled.  He has not had any issues with cough or sore throat.  He denies any vomiting or diarrhea.  He denies any headache or loss of consciousness.  Past Medical History:  Diagnosis Date  . Arthritis   . Back pain, chronic    intolerant to narcotics, avoids NSAIDs due to vioxx related bleed,  sees pain clinic as [facility], [facility] [place]  . Heart murmur   . Hypertension   . Inguinal hernia   . Polyneuropathy   . S/P thyroidectomy 1967  . Scoliosis   . Urinary retention     Patient Active Problem List   Diagnosis Date Noted  . Chronic diastolic CHF (congestive heart failure) (HCC) 05/16/2018  . Chronic fatigue 06/14/2017  . Palpitations 06/14/2017  . Aortic ectasia (HCC) 11/15/2016  . Age-related osteoporosis without current pathological fracture 10/27/2016  . Screening for AAA (abdominal aortic aneurysm) 10/27/2016  . Screening for prostate cancer 10/27/2016  . Need for hepatitis C screening test 10/27/2016  . Encounter for general adult medical examination without abnormal findings 11/26/2013  . Screening for osteoporosis 11/26/2013  . Kyphoscoliosis deformity of spine 07/04/2013  . Enlarged prostate with lower urinary tract symptoms (LUTS) 04/12/2013  . Screen for colon cancer 04/12/2013  . Undiagnosed cardiac  murmurs 04/12/2013  . Backache 04/12/2013  . White coat syndrome with hypertension 04/12/2013  . Lumbar spinal stenosis 10/29/2012  . DDD (degenerative disc disease), lumbosacral 03/16/2012  . Lumbosacral radiculopathy 03/16/2012  . Generalized anxiety disorder 03/14/2012  . Renal pain 12/16/2011  . Sacroiliitis, not elsewhere classified (HCC) 08/11/2011  . Sacroiliac joint pain 07/27/2011  . Bilateral hip pain 07/07/2011  . Chronic pain syndrome 07/07/2011  . Spondylolisthesis of lumbosacral region 07/07/2011  . Hereditary and idiopathic peripheral neuropathy 05/27/2011  . Accidental drug overdose 05/15/2011  . Guilty feelings 05/15/2011  . Hypertension 05/15/2011  . Hypotension 05/15/2011  . Bradycardia 05/15/2011  . Encephalopathy acute 05/15/2011  . Chronic back pain 05/15/2011  . Vitamin D deficiency 03/08/2011    Past Surgical History:  Procedure Laterality Date  . BACK SURGERY  1975   lumbx2  . COLONOSCOPY    . FOOT SURGERY  1985, 2000   due to arthritis  . HAND SURGERY  2005   left - due to arthritis  . INGUINAL HERNIA REPAIR  04/30/2012   Procedure: HERNIA REPAIR INGUINAL ADULT;  Surgeon: Currie Parishristian J Streck, MD;  Location: [facility];  Service: General;  Laterality: Left;  repair left inguinal hernia  . THYROIDECTOMY  1967  . TONSILLECTOMY         Family History  Problem Relation Age of Onset  . Heart disease Father  Social History   Tobacco Use  . Smoking status: Never  . Smokeless tobacco: Never  Vaping Use  . Vaping Use: Never used  Substance Use Topics  . Alcohol use: No  . Drug use: No    Home Medications Prior to Admission medications   Medication Sig Start Date End Date Taking? Authorizing Provider  acetaminophen (TYLENOL) 500 MG tablet Take 1,000 mg by mouth every 8 (eight) hours as needed. For pain.     [provider]  carvedilol (COREG) 25 MG tablet Take 1 tablet (25 mg total) by mouth 2 (two) times daily.  Please make overdue appt with Dr. Elease Hashimoto before anymore refills. 1st attempt 08/12/19   Nahser, Deloris Ping, MD  sertraline (ZOLOFT) 25 MG tablet Take 25 mg by mouth 3 (three) times daily.    [provider]    Allergies    Aspirin, Nsaids, Oxycodone, Tolmetin, Tramadol, and Codeine  Review of Systems   Review of Systems  All other systems reviewed and are negative.  Physical Exam Updated Vital Signs BP (!) 146/79   Pulse 71   Temp 98.8 F (37.1 C) (Oral)   Resp (!) 25   SpO2 95%   Physical Exam Vitals and nursing note reviewed.  Constitutional:      Appearance: He is well-developed. He is not diaphoretic.  HENT:     Head: Normocephalic and atraumatic.     Right Ear: External ear normal.     Left Ear: External ear normal.  Eyes:     General: No scleral icterus.       Right eye: No discharge.        Left eye: No discharge.     Conjunctiva/sclera: Conjunctivae normal.  Neck:     Trachea: No tracheal deviation.  Cardiovascular:     Rate and Rhythm: Normal rate and regular rhythm.  Pulmonary:     Effort: Pulmonary effort is normal. No respiratory distress.     Breath sounds: Normal breath sounds. No stridor. No wheezing or rales.  Abdominal:     General: Bowel sounds are normal. There is no distension.     Palpations: Abdomen is soft.     Tenderness: There is no abdominal tenderness. There is no guarding or rebound.  Musculoskeletal:        General: No deformity.     Cervical back: Neck supple.     Comments: Mild tenderness palpation right hip, no tenderness palpation cervical thoracic or lumbar spine  Skin:    General: Skin is warm and dry.     Findings: No rash.  Neurological:     General: No focal deficit present.     Mental Status: He is alert.     Cranial Nerves: No cranial nerve deficit (no facial droop, extraocular movements intact, no slurred speech).     Sensory: No sensory deficit.     Motor: No abnormal muscle tone or seizure activity.      Coordination: Coordination normal.  Psychiatric:        Mood and Affect: Mood normal.    ED Results / Procedures / Treatments   Labs (all labs ordered are listed, but only abnormal results are displayed) Labs Reviewed  CBC WITH DIFFERENTIAL/PLATELET - Abnormal; Notable for the following components:      Result Value   WBC 19.8 (*)    RBC 3.99 (*)    Hemoglobin 12.5 (*)    HCT 37.7 (*)    Neutro Abs 18.0 (*)  Lymphs Abs 0.4 (*)    Monocytes Absolute 1.3 (*)    Abs Immature Granulocytes 0.08 (*)    All other components within normal limits  BASIC METABOLIC PANEL - Abnormal; Notable for the following components:   Sodium 122 (*)    Chloride 90 (*)    Glucose, Bld 114 (*)    All other components within normal limits  HEPATIC FUNCTION PANEL - Abnormal; Notable for the following components:   Albumin 3.4 (*)    AST 78 (*)    ALT 61 (*)    Total Bilirubin 2.1 (*)    Bilirubin, Direct 0.4 (*)    Indirect Bilirubin 1.7 (*)    All other components within normal limits  LIPASE, BLOOD - Abnormal; Notable for the following components:   Lipase 379 (*)    All other components within normal limits  CULTURE, BLOOD (ROUTINE X 2)  CULTURE, BLOOD (ROUTINE X 2)  RESP PANEL BY RT-PCR (FLU A&B, COVID) ARPGX2  LACTIC ACID, PLASMA  URINALYSIS, ROUTINE W REFLEX MICROSCOPIC    EKG EKG Interpretation  Date/Time:  Tuesday November 10 2020 15:02:17 EDT Ventricular Rate:  76 PR Interval:  190 QRS Duration: 116 QT Interval:  404 QTC Calculation: 454 R Axis:   53 Text Interpretation: Normal sinus rhythm Normal ECG No significant change since last tracing Confirmed by Brian Ruiz [id number]) on 11/10/2020 3:56:45 PM  Radiology DG Ribs Unilateral W/Chest Right  Result Date: 11/10/2020 CLINICAL DATA:  Fall, generalized weakness EXAM: RIGHT RIBS AND CHEST - 3+ VIEW COMPARISON:  Radiograph 05/15/2011 FINDINGS: Unchanged, chronically elevated right hemidiaphragm with right basilar atelectasis. No new  focal airspace consolidation. No visible pneumothorax unchanged cardiomediastinal silhouette with aortic calcification. There is no evidence of displaced rib fracture. IMPRESSION: No evidence of displaced rib fracture. Unchanged, chronically elevated right hemidiaphragm with right basilar atelectasis. Electronically Signed   By: Caprice Renshaw   On: 11/10/2020 15:43   CT ABDOMEN PELVIS W CONTRAST  Result Date: 11/10/2020 CLINICAL DATA:  Multiple falls today. Increased weakness. Chills today and yesterday. EXAM: CT ABDOMEN AND PELVIS WITH CONTRAST TECHNIQUE: Multidetector CT imaging of the abdomen and pelvis was performed using the standard protocol following bolus administration of intravenous contrast. CONTRAST:  OMNIPAQUE IOHEXOL 300 MG/ML  SOLN COMPARISON:  None. FINDINGS: Lower chest: Atelectasis or consolidation in the right lung base. No effusion. Elevation of the right hemidiaphragm. Hepatobiliary: Subcentimeter low-attenuation lesions in the lateral segment left lobe of liver, likely cysts. The gallbladder is distended with mild gallbladder wall thickening and edema. Mild extrahepatic bile duct dilatation. Suggestion of small stones in the gallbladder. No definitive common duct stones. Appearance suggest cholecystitis. Pancreas: Pancreas is mildly enlarged with mild pancreatic ductal dilatation. Inflammation in the peripancreatic fat. Prominence of the head of the pancreas. Changes likely represent early acute pancreatitis. No collections are identified. Follow-up after resolution of acute process is recommended to exclude underlying pancreatic head mass. Spleen: Normal in size without focal abnormality. Adrenals/Urinary Tract: Adrenal glands are unremarkable. Kidneys are normal, without renal calculi, focal lesion, or hydronephrosis. Bladder is diffusely distended with irregular and trabeculated bladder wall and multiple small bladder diverticula. Changes suggest chronic outlet obstruction. No wall  thickening. Stomach/Bowel: Stomach, small bowel, and colon are not abnormally distended although there is scattered fluid in the small bowel. No wall thickening or inflammatory changes are appreciated. Scattered stool in the colon. Scattered colonic diverticula without evidence of diverticulitis. Appendix is not identified. Vascular/Lymphatic: Aortic atherosclerosis. No enlarged abdominal or pelvic  lymph nodes. Reproductive: Prostate gland is enlarged, measuring 5.8 cm diameter. Other: No free air or free fluid in the abdomen. Abdominal wall musculature appears intact. Musculoskeletal: Degenerative changes in the spine and hips. Lumbar scoliosis convex towards the left. IMPRESSION: 1. Distended gallbladder with mild edema and small stones. Bile duct dilatation. Changes may represent cholecystitis or biliary obstruction. 2. Inflammatory changes involving the pancreas with peripancreatic edema likely representing acute pancreatitis. There is fullness in the head of the pancreas and follow-up after resolution of acute process is recommended to exclude underlying pancreatic mass. 3. Enlarged prostate gland with distended bladder. 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 4. Atelectasis or consolidation in the right lung base. Electronically Signed   By: Burman Nieves M.D.   On: 11/10/2020 19:49   DG Hip Unilat W or Wo Pelvis 2-3 Views Right  Result Date: 11/10/2020 CLINICAL DATA:  Fall and pain. EXAM: DG HIP (WITH OR WITHOUT PELVIS) 2-3V RIGHT COMPARISON:  None. FINDINGS: AP view of the pelvis and AP/frog leg views of the right hip. Femoral heads are located. Marked osteoarthritis involving the weight-bearing surface of the right hip. No acute fracture. Mild osteopenia. IMPRESSION: Marked right hip osteoarthritis, without acute superimposed process. Given the severity of osteopenia, if there is a strong clinical concern of fracture, consider further  evaluation with MRI. Electronically Signed   By: Jeronimo Greaves M.D.   On: 11/10/2020 17:13    Procedures .Critical Care  Date/Time: 11/10/2020 8:11 PM Performed by: Brian Dibbles, MD Authorized by: Brian Dibbles, MD   Critical care provider statement:    Critical care time (minutes):  45   Critical care was time spent personally by me on the following activities:  Discussions with consultants, evaluation of patient's response to treatment, examination of patient, ordering and performing treatments and interventions, ordering and review of laboratory studies, ordering and review of radiographic studies, pulse oximetry, re-evaluation of patient's condition, obtaining history from patient or surrogate and review of old charts   Medications Ordered in ED Medications  sodium chloride 0.9 % bolus 500 mL (0 mLs Intravenous Stopped 11/10/20 1829)    Followed by  0.9 %  sodium chloride infusion (1,000 mLs Intravenous New Bag/Given 11/10/20 1721)  ceFEPIme (MAXIPIME) 1 g in sodium chloride 0.9 % 100 mL IVPB (has no administration in time range)    And  metroNIDAZOLE (FLAGYL) IVPB 500 mg (has no administration in time range)  fentaNYL (SUBLIMAZE) injection 50 mcg (50 mcg Intravenous Given 11/10/20 1717)  iohexol (OMNIPAQUE) 300 MG/ML solution 100 mL (100 mLs Intravenous Contrast Given 11/10/20 1928)    ED Course  I have reviewed the triage vital signs and the nursing notes.  Pertinent labs & imaging results that were available during my care of the patient were reviewed by me and considered in my medical decision making (see chart for details).  Clinical Course as of 11/10/20 2013  Tue Nov 10, 2020  1628 Chest x-ray does not show rib fractures.  No pneumonia noted [JK]  1629 Hyponatremia is chronic but sodium level is lower than previous values [JK]  1629 Leukocytosis noted [JK]  1850 Lipase is elevated at 379.  LFTs are also elevated [JK]  2002 CT scan shows evidence of possible Coley cystitis and  choledocholithiasis.  Patient also has evidence of pancreatitis [JK]  2012 Discussed case with Dr Royanne Foots [JK]    Clinical Course User Index [JK] Brian Dibbles, MD   MDM Rules/Calculators/A&P  Patient presented to the ED for evaluation of falls today.  Patient did notice some chills but have not measured any fevers at home.  He was not having any vomiting or diarrhea.  On exam he did have some mild tenderness in the epigastric region.  Patient's laboratory test did show elevated lipase as well as LFTs.  CT scan was performed and it does show findings concerning for possible choledocholithiasis as well as pancreatitis.  As far as his injuries from his fall he does not have any rib fractures.  X-rays hip does show severe osteoarthritis.  Patient has been able to bear weight so I doubt occult fracture at this time.  I started him on antibiotics.  Patient has been given IV fluids.  I will consult the medical service for admission.  Have also placed a secure chat consult to gastroenterology.  I will also consult general surgery. Final Clinical Impression(s) / ED Diagnoses Final diagnoses:  Cholecystitis  Choledocholithiasis  Acute biliary pancreatitis, unspecified complication status     Brian Dibbles, MD 11/10/20 2013

## 2020-11-10 NOTE — ED Notes (Signed)
Patient transported to CT 

## 2020-11-10 NOTE — ED Provider Notes (Signed)
Emergency Medicine Provider Triage Evaluation Note  Brian Ruiz , a 75 y.o. male  was evaluated in triage.  Pt complains of 2 falls today  Review of Systems  Positive: weakness Negative: fever  Physical Exam  BP 116/70 (BP Location: Left Arm)   Pulse 83   Temp 98.8 F (37.1 C) (Oral)   Resp (!) 22   SpO2 97%  Gen:   Awake, no distress   Resp:  Normal effort  tender right ribs MSK:   Moves extremities without difficulty  Other:    Medical Decision Making  Medically screening exam initiated at 3:23 PM.  Appropriate orders placed.  Harl Favor was informed that the remainder of the evaluation will be completed by another provider, this initial triage assessment does not replace that evaluation, and the importance of remaining in the ED until their evaluation is complete.     Elson Areas, [place] 11/10/20 1524    Gerhard Munch, MD 11/11/20 [phone]

## 2020-11-11 DIAGNOSIS — R945 Abnormal results of liver function studies: Secondary | ICD-10-CM

## 2020-11-11 DIAGNOSIS — K859 Acute pancreatitis without necrosis or infection, unspecified: Secondary | ICD-10-CM

## 2020-11-11 DIAGNOSIS — I10 Essential (primary) hypertension: Secondary | ICD-10-CM

## 2020-11-11 DIAGNOSIS — K838 Other specified diseases of biliary tract: Secondary | ICD-10-CM

## 2020-11-11 LAB — URINALYSIS, ROUTINE W REFLEX MICROSCOPIC
Bacteria, UA: NONE SEEN
Bilirubin Urine: NEGATIVE
Glucose, UA: NEGATIVE mg/dL
Ketones, ur: NEGATIVE mg/dL
Leukocytes,Ua: NEGATIVE
Nitrite: NEGATIVE
Protein, ur: NEGATIVE mg/dL
Specific Gravity, Urine: 1.018 (ref 1.005–1.030)
pH: 6 (ref 5.0–8.0)

## 2020-11-11 LAB — CBC
HCT: 37.2 % — ABNORMAL LOW (ref 39.0–52.0)
Hemoglobin: 12.4 g/dL — ABNORMAL LOW (ref 13.0–17.0)
MCH: 31.5 pg (ref 26.0–34.0)
MCHC: 33.3 g/dL (ref 30.0–36.0)
MCV: 94.4 fL (ref 80.0–100.0)
Platelets: 145 10*3/uL — ABNORMAL LOW (ref 150–400)
RBC: 3.94 MIL/uL — ABNORMAL LOW (ref 4.22–5.81)
RDW: 13.3 % (ref 11.5–15.5)
WBC: 16.3 10*3/uL — ABNORMAL HIGH (ref 4.0–10.5)
nRBC: 0 % (ref 0.0–0.2)

## 2020-11-11 LAB — BASIC METABOLIC PANEL
Anion gap: 10 (ref 5–15)
Anion gap: 8 (ref 5–15)
Anion gap: 14 (ref 5–15)
BUN: 11 mg/dL (ref 8–23)
BUN: 9 mg/dL (ref 8–23)
BUN: 11 mg/dL (ref 8–23)
CO2: 23 mmol/L (ref 22–32)
CO2: 21 mmol/L — ABNORMAL LOW (ref 22–32)
CO2: 16 mmol/L — ABNORMAL LOW (ref 22–32)
Calcium: 9 mg/dL (ref 8.9–10.3)
Calcium: 8.6 mg/dL — ABNORMAL LOW (ref 8.9–10.3)
Calcium: 8.7 mg/dL — ABNORMAL LOW (ref 8.9–10.3)
Chloride: 93 mmol/L — ABNORMAL LOW (ref 98–111)
Chloride: 96 mmol/L — ABNORMAL LOW (ref 98–111)
Chloride: 99 mmol/L (ref 98–111)
Creatinine, Ser: 0.88 mg/dL (ref 0.61–1.24)
Creatinine, Ser: 0.75 mg/dL (ref 0.61–1.24)
Creatinine, Ser: 0.75 mg/dL (ref 0.61–1.24)
GFR, Estimated: >60 mL/min (ref >60)
GFR, Estimated: >60 mL/min (ref >60)
GFR, Estimated: >60 mL/min (ref >60)
Glucose, Bld: 102 mg/dL — ABNORMAL HIGH (ref 70–99)
Glucose, Bld: 93 mg/dL (ref 70–99)
Glucose, Bld: 75 mg/dL (ref 70–99)
Potassium: 4.1 mmol/L (ref 3.5–5.1)
Potassium: 3.9 mmol/L (ref 3.5–5.1)
Potassium: 4.2 mmol/L (ref 3.5–5.1)
Sodium: 126 mmol/L — ABNORMAL LOW (ref 135–145)
Sodium: 125 mmol/L — ABNORMAL LOW (ref 135–145)
Sodium: 129 mmol/L — ABNORMAL LOW (ref 135–145)

## 2020-11-11 LAB — TSH: TSH: 1.847 u[IU]/mL (ref 0.350–4.500)

## 2020-11-11 LAB — HEPATIC FUNCTION PANEL
ALT: 50 U/L — ABNORMAL HIGH (ref 0–44)
AST: 54 U/L — ABNORMAL HIGH (ref 15–41)
Albumin: 3.3 g/dL — ABNORMAL LOW (ref 3.5–5.0)
Alkaline Phosphatase: 39 U/L (ref 38–126)
Bilirubin, Direct: 0.3 mg/dL — ABNORMAL HIGH (ref 0.0–0.2)
Indirect Bilirubin: 0.8 mg/dL (ref 0.3–0.9)
Total Bilirubin: 1.1 mg/dL (ref 0.3–1.2)
Total Protein: 6 g/dL — ABNORMAL LOW (ref 6.5–8.1)

## 2020-11-11 LAB — CORTISOL: Cortisol, Plasma: 26.9 ug/dL

## 2020-11-11 LAB — OSMOLALITY, URINE: Osmolality, Ur: 329 mOsm/kg (ref 300–900)

## 2020-11-11 LAB — SODIUM, URINE, RANDOM: Sodium, Ur: 10 mmol/L

## 2020-11-11 MED ORDER — PIPERACILLIN-TAZOBACTAM 3.375 G IVPB
3.3750 g | Freq: Three times a day (TID) | INTRAVENOUS | Status: AC
  Administered 2020-11-11 – 2020-11-14 (×11): 3.375 g via INTRAVENOUS
  Filled 2020-11-11 (×12): qty 50

## 2020-11-11 MED ORDER — SERTRALINE HCL 50 MG PO TABS
25.0000 mg | ORAL_TABLET | Freq: Three times a day (TID) | ORAL | Status: DC
  Administered 2020-11-11 – 2020-11-15 (×12): 25 mg via ORAL
  Filled 2020-11-11 (×12): qty 1

## 2020-11-11 MED ORDER — CARVEDILOL 25 MG PO TABS
25.0000 mg | ORAL_TABLET | Freq: Two times a day (BID) | ORAL | Status: DC
  Administered 2020-11-11 – 2020-11-15 (×9): 25 mg via ORAL
  Filled 2020-11-11: qty 1
  Filled 2020-11-11: qty 2
  Filled 2020-11-11 (×3): qty 1
  Filled 2020-11-11: qty 8
  Filled 2020-11-11 (×4): qty 1

## 2020-11-11 MED ORDER — PANTOPRAZOLE SODIUM 40 MG PO TBEC
40.0000 mg | DELAYED_RELEASE_TABLET | Freq: Every day | ORAL | Status: DC
  Administered 2020-11-11 – 2020-11-15 (×5): 40 mg via ORAL
  Filled 2020-11-11 (×5): qty 1

## 2020-11-11 MED ORDER — SODIUM CHLORIDE 0.9 % IV SOLN: INTRAVENOUS | Status: AC

## 2020-11-11 MED ORDER — HYDROCODONE-ACETAMINOPHEN 5-325 MG PO TABS
1.0000 | ORAL_TABLET | ORAL | Status: DC | PRN
  Administered 2020-11-13: 2 via ORAL
  Filled 2020-11-11: qty 2

## 2020-11-11 NOTE — ED Notes (Signed)
Floor refused to take report at this time. Due to CN not aware of the nurse getting the patient

## 2020-11-11 NOTE — Plan of Care (Signed)

## 2020-11-11 NOTE — Consult Note (Addendum)
[facility] Surgery Consult Note  Brian Ruiz 1946/03/29  [id number].    Requesting MD: Toniann Fail, MD Chief Complaint/Reason for Consult: acute pancreatitis   HPI:  Brian Ruiz is a 75 y/o M with a history of HTN, PVC's, arthritis, chronic back pain, and UGIB 2/2 PUD who presented to the ED after multiple falls. He states he fell between the wall and the toilet around 0400 yesterday 6/14, hitting his right side, unsure if LOC/syncopal event. States due to his hip arthritis he could not get off of the floor and his wife is too weak to help him up EMS was called and got him up, urged him to come to ED but he refused and went back to bed. Reports a second fall around 1100, states he hit his head, EMS came, he was brought to hospital. Reports a roughly 2 year history of intermittent upper abdominal pain, ever since he was diagnosed and treated for UGIB related to a gastric ulcer. He takes a PPI for this currently. He does report increased epigastric pain, different than his usual pain, for about 48 hours. He states he does not usually notice his upper abdominal pain much because he has a lot of chronic, distracting pain in his back - used to see a pain clinic in [place]. He denies nausea or vomiting, reports daily BMs that are formed and non-bloody. Denies black stools. Last BM was Monday 6/13.   Denies sick contacts, recent travel, fever, chills, CP, SOB, urinary sxs, melena, hematochezia.  Denies use of blood thinning medications. Denies previous history of lung disease, CVA, or MI.  Surgical Hx: LIH repair by Dr. Jamey Ripa 2013, thyroidectomy 1967 Substance: states he is a former smoker who quit in his early 20's, has a history of heavy alcohol use and says he quit 25 years ago because it "was ruining his life". denies current opioid drug use. Social: Lives in a home in [place], [place] with his spouse. Does not drive anymore due to changes in vision in the last few months. States for about a  year he has stopped going out as much due to pain - his wife goes to the grocery store and does the housework. States he should ambulate with a walker or cane but has not been.    ROS: As above  Review of Systems  All other systems reviewed and are negative.  Family History  Problem Relation Age of Onset   Heart disease Father     Past Medical History:  Diagnosis Date   Arthritis    Back pain, chronic    intolerant to narcotics, avoids NSAIDs due to vioxx related bleed,  sees pain clinic [place], prior eval by [facility] neuro in [place]   Heart murmur    Hypertension    Inguinal hernia    Polyneuropathy    S/P thyroidectomy 1967   Scoliosis    Urinary retention     Past Surgical History:  Procedure Laterality Date   BACK SURGERY  1975   [id number]   COLONOSCOPY     FOOT SURGERY  1985, 2000   due to arthritis   HAND SURGERY  2005   left - due to arthritis   INGUINAL HERNIA REPAIR  04/30/2012   Procedure: HERNIA REPAIR INGUINAL ADULT;  Surgeon: Currie Paris, MD;  Location: [facility];  Service: General;  Laterality: Left;  repair left inguinal hernia   THYROIDECTOMY  1967   TONSILLECTOMY      Social  History:  reports that he has never smoked. He has never used smokeless tobacco. He reports that he does not drink alcohol and does not use drugs.  Allergies:  Allergies  Allergen Reactions   Aspirin Shortness Of Breath    Other reaction(s): Bronchospasm (ALLERGY/intolerance)   Nsaids Shortness Of Breath    Other reaction(s): Bronchospasm (ALLERGY/intolerance)   Oxycodone Other (See Comments)    Agitation. Per the any pain medication that has codeine in it causes that reaction. Other reaction(s): Other Agitation. Per the any pain medication that has codeine in it causes that reaction.   Tolmetin Shortness Of Breath   Tramadol Anaphylaxis   Codeine Anxiety    Other reaction(s): Bronchospasm (ALLERGY/intolerance), Other (See Comments) Irritable      (Not in a hospital admission)   Blood pressure 115/80, pulse 68, temperature 98.8 F (37.1 C), temperature source Oral, resp. rate 18, SpO2 96 %. Physical Exam: Constitutional: NAD; conversant; no deformities Eyes: Moist conjunctiva; no lid lag; anicteric; PERRL Neck: Trachea midline; no thyromegaly Lungs: slightly labored on room air; no tactile fremitus CV: RRR; no palpable thrills; no pitting edema GI: Abd soft, overall nontender, mild subjective tenderness in RLQ and epigastric region; no palpable hepatosplenomegaly MSK: symmetrical ; no clubbing/cyanosis Psychiatric: Appropriate affect; alert and oriented x3 Lymphatic: No palpable cervical or axillary lymphadenopathy  Results for orders placed or performed during the hospital encounter of 11/10/20 (from the past 48 hour(s))  CBC with Differential     Status: Abnormal   Collection Time: 11/10/20  3:18 PM  Result Value Ref Range   WBC 19.8 (H) 4.0 - 10.5 K/uL   RBC 3.99 (L) 4.22 - 5.81 MIL/uL   Hemoglobin 12.5 (L) 13.0 - 17.0 g/dL   HCT 16.1 (L) 09.6 - 04.5 %   MCV 94.5 80.0 - 100.0 fL   MCH 31.3 26.0 - 34.0 pg   MCHC 33.2 30.0 - 36.0 g/dL   RDW 40.9 81.1 - 91.4 %   Platelets 169 150 - 400 K/uL   nRBC 0.0 0.0 - 0.2 %   Neutrophils Relative % 92 %   Neutro Abs 18.0 (H) 1.7 - 7.7 K/uL   Lymphocytes Relative 2 %   Lymphs Abs 0.4 (L) 0.7 - 4.0 K/uL   Monocytes Relative 6 %   Monocytes Absolute 1.3 (H) 0.1 - 1.0 K/uL   Eosinophils Relative 0 %   Eosinophils Absolute 0.0 0.0 - 0.5 K/uL   Basophils Relative 0 %   Basophils Absolute 0.0 0.0 - 0.1 K/uL   Immature Granulocytes 0 %   Abs Immature Granulocytes 0.08 (H) 0.00 - 0.07 K/uL    Comment: Performed at [facility], [address] [place], [place] [address]  Basic metabolic panel     Status: Abnormal   Collection Time: 11/10/20  3:18 PM  Result Value Ref Range   Sodium 122 (L) 135 - 145 mmol/L   Potassium 4.0 3.5 - 5.1 mmol/L   Chloride 90 (L) 98 - 111  mmol/L   CO2 24 22 - 32 mmol/L   Glucose, Bld 114 (H) 70 - 99 mg/dL    Comment: Glucose reference range applies only to samples taken after fasting for at least 8 hours.   BUN 17 8 - 23 mg/dL   Creatinine, Ser 6.21 0.61 - 1.24 mg/dL   Calcium 9.2 8.9 - 30.8 mg/dL   GFR, Estimated >65 >78 mL/min    Comment: (NOTE) Calculated using the CKD-EPI Creatinine Equation (2021)  Anion gap 8 5 - 15    Comment: Performed at [facility], [address] [place], [place] [address]  Hepatic function panel     Status: Abnormal   Collection Time: 11/10/20  4:31 PM  Result Value Ref Range   Total Protein 6.5 6.5 - 8.1 g/dL   Albumin 3.4 (L) 3.5 - 5.0 g/dL   AST 78 (H) 15 - 41 U/L   ALT 61 (H) 0 - 44 U/L   Alkaline Phosphatase 45 38 - 126 U/L   Total Bilirubin 2.1 (H) 0.3 - 1.2 mg/dL   Bilirubin, Direct 0.4 (H) 0.0 - 0.2 mg/dL   Indirect Bilirubin 1.7 (H) 0.3 - 0.9 mg/dL    Comment: Performed at [facility], [address] [place], [place] [address]  Lipase, blood     Status: Abnormal   Collection Time: 11/10/20  4:31 PM  Result Value Ref Range   Lipase 379 (H) 11 - 51 U/L    Comment: Performed at [facility], [address] [place], [place] [address]  Blood culture (routine x 2)     Status: None (Preliminary result)   Collection Time: 11/10/20  4:34 PM   Specimen: BLOOD  Result Value Ref Range   Specimen Description BLOOD SITE NOT SPECIFIED    Special Requests      BOTTLES DRAWN AEROBIC AND ANAEROBIC Blood Culture adequate volume   Culture      NO GROWTH < 12 HOURS Performed at [facility], [address] [place], [place] [address]    Report Status PENDING   Lactic acid, plasma     Status: None   Collection Time: 11/10/20  5:16 PM  Result Value Ref Range   Lactic Acid, Venous 1.1 0.5 - 1.9 mmol/L    Comment: Performed at [facility], [address] [place], [place] [address]  Blood culture (routine x 2)     Status: None (Preliminary result)    Collection Time: 11/10/20  5:16 PM   Specimen: BLOOD  Result Value Ref Range   Specimen Description BLOOD BLOOD RIGHT FOREARM    Special Requests      BOTTLES DRAWN AEROBIC AND ANAEROBIC Blood Culture adequate volume   Culture      NO GROWTH < 12 HOURS Performed at [facility], [address] [place], [place] [address]    Report Status PENDING   Resp Panel by RT-PCR (Flu A&B, Covid) Nasopharyngeal Swab     Status: None   Collection Time: 11/10/20  6:29 PM   Specimen: Nasopharyngeal Swab; Nasopharyngeal(NP) swabs in vial transport medium  Result Value Ref Range   SARS Coronavirus 2 by RT PCR NEGATIVE NEGATIVE    Comment: (NOTE) SARS-CoV-2 target nucleic acids are NOT DETECTED.  The SARS-CoV-2 RNA is generally detectable in upper respiratory specimens during the acute phase of infection. The lowest concentration of SARS-CoV-2 viral copies this assay can detect is 138 copies/mL. A negative result does not preclude SARS-Cov-2 infection and should not be used as the sole basis for treatment or other patient management decisions. A negative result may occur with  improper specimen collection/handling, submission of specimen other than nasopharyngeal swab, presence of viral mutation(s) within the areas targeted by this assay, and inadequate number of viral copies(<138 copies/mL). A negative result must be combined with clinical observations, patient history, and epidemiological information. The expected result is Negative.  Fact Sheet for Patients:  BloggerCourse.comhttps://www.fda.gov/media/152166/download  Fact Sheet  for Healthcare Providers:  SeriousBroker.it  This test is no t yet approved or cleared by the [place] and  has been authorized for detection and/or diagnosis of SARS-CoV-2 by FDA under an Emergency Use Authorization (EUA). This EUA will remain  in effect (meaning this test can be used) for the duration of the COVID-19 declaration under  Section 564(b)(1) of the Act, 21 U.S.C.section 360bbb-3(b)(1), unless the authorization is terminated  or revoked sooner.       Influenza A by PCR NEGATIVE NEGATIVE   Influenza B by PCR NEGATIVE NEGATIVE    Comment: (NOTE) The Xpert Xpress SARS-CoV-2/FLU/RSV plus assay is intended as an aid in the diagnosis of influenza from Nasopharyngeal swab specimens and should not be used as a sole basis for treatment. Nasal washings and aspirates are unacceptable for Xpert Xpress SARS-CoV-2/FLU/RSV testing.  Fact Sheet for Patients: BloggerCourse.com  Fact Sheet for Healthcare Providers: SeriousBroker.it  This test is not yet approved or cleared by the [place] FDA and has been authorized for detection and/or diagnosis of SARS-CoV-2 by FDA under an Emergency Use Authorization (EUA). This EUA will remain in effect (meaning this test can be used) for the duration of the COVID-19 declaration under Section 564(b)(1) of the Act, 21 U.S.C. section 360bbb-3(b)(1), unless the authorization is terminated or revoked.  Performed at [facility], [address], [place], [place] [address]   Urinalysis, Routine w reflex microscopic     Status: Abnormal   Collection Time: 11/11/20 12:41 AM  Result Value Ref Range   Color, Urine YELLOW YELLOW   APPearance CLEAR CLEAR   Specific Gravity, Urine 1.018 1.005 - 1.030   pH 6.0 5.0 - 8.0   Glucose, UA NEGATIVE NEGATIVE mg/dL   Hgb urine dipstick SMALL (A) NEGATIVE   Bilirubin Urine NEGATIVE NEGATIVE   Ketones, ur NEGATIVE NEGATIVE mg/dL   Protein, ur NEGATIVE NEGATIVE mg/dL   Nitrite NEGATIVE NEGATIVE   Leukocytes,Ua NEGATIVE NEGATIVE   RBC / HPF 0-5 0 - 5 RBC/hpf   Bacteria, UA NONE SEEN NONE SEEN   Mucus PRESENT     Comment: Performed at [facility], [address] [place], [place] [address]  TSH     Status: None   Collection Time: 11/11/20 12:57 AM  Result Value Ref Range    TSH 1.847 0.350 - 4.500 uIU/mL    Comment: Performed by a 3rd Generation assay with a functional sensitivity of <=0.01 uIU/mL. Performed at [facility], [address], [place], [place] [address]   Cortisol     Status: None   Collection Time: 11/11/20 12:57 AM  Result Value Ref Range   Cortisol, Plasma 26.9 ug/dL    Comment: (NOTE) AM    6.7 - 22.6 ug/dL PM   <91.4       ug/dL Performed at [facility], [address], [place], [place] [address]   Basic metabolic panel     Status: Abnormal   Collection Time: 11/11/20 12:57 AM  Result Value Ref Range   Sodium 126 (L) 135 - 145 mmol/L   Potassium 4.1 3.5 - 5.1 mmol/L   Chloride 93 (L) 98 - 111 mmol/L   CO2 23 22 - 32 mmol/L   Glucose, Bld 102 (H) 70 - 99 mg/dL    Comment: Glucose reference range applies only to samples taken after fasting for at least 8 hours.   BUN 11 8 - 23 mg/dL   Creatinine, Ser 6.21 0.61 -  1.24 mg/dL   Calcium 9.0 8.9 - 38.4 mg/dL   GFR, Estimated >53 >64 mL/min    Comment: (NOTE) Calculated using the CKD-EPI Creatinine Equation (2021)    Anion gap 10 5 - 15    Comment: Performed at [facility], [address], [place], [place] [address]  Sodium, urine, random     Status: None   Collection Time: 11/11/20  3:57 AM  Result Value Ref Range   Sodium, Ur 10 mmol/L    Comment: Performed at [facility], [address], [place], [place] [address]  Osmolality, urine     Status: None   Collection Time: 11/11/20  3:57 AM  Result Value Ref Range   Osmolality, Ur 329 300 - 900 mOsm/kg    Comment: Performed at [facility], [address] [place], [place] [address]   DG Ribs Unilateral W/Chest Right  Result Date: 11/10/2020 CLINICAL DATA:  Fall, generalized weakness EXAM: RIGHT RIBS AND CHEST - 3+ VIEW COMPARISON:  Radiograph 05/15/2011 FINDINGS: Unchanged, chronically elevated right hemidiaphragm with right basilar atelectasis. No new focal airspace consolidation. No visible  pneumothorax unchanged cardiomediastinal silhouette with aortic calcification. There is no evidence of displaced rib fracture. IMPRESSION: No evidence of displaced rib fracture. Unchanged, chronically elevated right hemidiaphragm with right basilar atelectasis. Electronically Signed   By: Caprice Renshaw   On: 11/10/2020 15:43   CT ABDOMEN PELVIS W CONTRAST  Result Date: 11/10/2020 CLINICAL DATA:  Multiple falls today. Increased weakness. Chills today and yesterday. EXAM: CT ABDOMEN AND PELVIS WITH CONTRAST TECHNIQUE: Multidetector CT imaging of the abdomen and pelvis was performed using the standard protocol following bolus administration of intravenous contrast. CONTRAST:  OMNIPAQUE IOHEXOL 300 MG/ML  SOLN COMPARISON:  None. FINDINGS: Lower chest: Atelectasis or consolidation in the right lung base. No effusion. Elevation of the right hemidiaphragm. Hepatobiliary: Subcentimeter low-attenuation lesions in the lateral segment left lobe of liver, likely cysts. The gallbladder is distended with mild gallbladder wall thickening and edema. Mild extrahepatic bile duct dilatation. Suggestion of small stones in the gallbladder. No definitive common duct stones. Appearance suggest cholecystitis. Pancreas: Pancreas is mildly enlarged with mild pancreatic ductal dilatation. Inflammation in the peripancreatic fat. Prominence of the head of the pancreas. Changes likely represent early acute pancreatitis. No collections are identified. Follow-up after resolution of acute process is recommended to exclude underlying pancreatic head mass. Spleen: Normal in size without focal abnormality. Adrenals/Urinary Tract: Adrenal glands are unremarkable. Kidneys are normal, without renal calculi, focal lesion, or hydronephrosis. Bladder is diffusely distended with irregular and trabeculated bladder wall and multiple small bladder diverticula. Changes suggest chronic outlet obstruction. No wall thickening. Stomach/Bowel: Stomach, small  bowel, and colon are not abnormally distended although there is scattered fluid in the small bowel. No wall thickening or inflammatory changes are appreciated. Scattered stool in the colon. Scattered colonic diverticula without evidence of diverticulitis. Appendix is not identified. Vascular/Lymphatic: Aortic atherosclerosis. No enlarged abdominal or pelvic lymph nodes. Reproductive: Prostate gland is enlarged, measuring 5.8 cm diameter. Other: No free air or free fluid in the abdomen. Abdominal wall musculature appears intact. Musculoskeletal: Degenerative changes in the spine and hips. Lumbar scoliosis convex towards the left. IMPRESSION: 1. Distended gallbladder with mild edema and small stones. Bile duct dilatation. Changes may represent cholecystitis or biliary obstruction. 2. Inflammatory changes involving the pancreas with peripancreatic edema likely representing acute pancreatitis. There is fullness in the head of the pancreas and follow-up after resolution of acute process is  recommended to exclude underlying pancreatic mass. 3. Enlarged prostate gland with distended bladder. 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 4. Atelectasis or consolidation in the right lung base. Electronically Signed   By: Burman Nieves M.D.   On: 11/10/2020 19:49   DG Hip Unilat W or Wo Pelvis 2-3 Views Right  Result Date: 11/10/2020 CLINICAL DATA:  Fall and pain. EXAM: DG HIP (WITH OR WITHOUT PELVIS) 2-3V RIGHT COMPARISON:  None. FINDINGS: AP view of the pelvis and AP/frog leg views of the right hip. Femoral heads are located. Marked osteoarthritis involving the weight-bearing surface of the right hip. No acute fracture. Mild osteopenia. IMPRESSION: Marked right hip osteoarthritis, without acute superimposed process. Given the severity of osteopenia, if there is a strong clinical concern of fracture, consider further evaluation with MRI. Electronically Signed    By: Jeronimo Greaves M.D.   On: 11/10/2020 17:13      Assessment/Plan Epigastric abdominal pain, nausea Elevated LFT's  Acute pancreatitis, suspect biliary in origin, has a remote history of EtOH abuse  Suspect he also has acute vs chronic calculous cholecystitis  - afebrile, VSS, WBC 16 from 19  - AST 54, ALT 50, total bilirubin 1.1 from 2.1 yesterday, lipase was 379 yesterday and not repeated today  - MRCP with signs of cholecystitis, also showed Intra and extrahepatic biliary ductal dilation with fairly abrupt tapering of the CBD at the level of the ampulla. No definite choledocholithiasis or obvious obstruction. "Further evaluation with EUS/ERCP may be warranted to assess for discrete obstructive etiology". Agree with GI consult given these findings  - suspect patient has biliary pancreatitis, may have passed a common duct stone. He also has signs of cholecystitis. Given above MRCP suggesting possible partial obstruction of CBD, will follow up GI recs. Recheck LFTs and lipase in AM. Would likely benefit from cholecystectomy this admission.    FEN: NPO, sips with meds ID: Zosyn 6/15 >> VTE: SCD's Foley: none   HTN PMH PUD on omeprazole    Adam Phenix, [facility] Surgery Please see Amion for pager number during day hours 7:00am-4:30pm 11/11/2020, 7:54 AM

## 2020-11-11 NOTE — Progress Notes (Signed)
Pharmacy Antibiotic Note  Brian Ruiz is a 75 y.o. male admitted on 11/10/2020 with  intra abdominal infection .  Pharmacy has been consulted for zosyn dosing.  Plan: Zosyn 3.375g IV q8h (4 hour infusion). F/u cultures and clinical course     Temp (24hrs), Avg:98.8 F (37.1 C), Min:98.8 F (37.1 C), Max:98.8 F (37.1 C)  Recent Labs  Lab 11/10/20 1518 11/10/20 1716  WBC 19.8*  --   CREATININE 1.04  --   LATICACIDVEN  --  1.1    CrCl cannot be calculated (Unknown ideal weight.).    Allergies  Allergen Reactions   Aspirin Shortness Of Breath    Other reaction(s): Bronchospasm (ALLERGY/intolerance)   Nsaids Shortness Of Breath    Other reaction(s): Bronchospasm (ALLERGY/intolerance)   Oxycodone Other (See Comments)    Agitation. Per the any pain medication that has codeine in it causes that reaction. Other reaction(s): Other Agitation. Per the any pain medication that has codeine in it causes that reaction.   Tolmetin Shortness Of Breath   Tramadol Anaphylaxis   Codeine Anxiety    Other reaction(s): Bronchospasm (ALLERGY/intolerance), Other (See Comments) Irritable     Thank you for allowing pharmacy to be a part of this patient's care.  Talbert Cage P[place]et 11/11/2020 1:04 AM

## 2020-11-11 NOTE — Progress Notes (Signed)
PROGRESS NOTE    Brian Ruiz  NFA:[id number] DOB: November 23, 1945 DOA: 11/10/2020 PCP: Tracey Harries, MD   Brief Narrative:  Brian Ruiz is a 75 y.o. male with history of hypertension PVCs and peptic ulcer disease who has had GI bleed previously seen by gastroenterologist and colonoscopy last EGD was in January 2021 has had a fall today and was brought to the ER.  Patient had 2 falls 1 at 4 AM but at the time EMS advised coming to ER but patient refused.  Subsequent which patient had another fall at around 11 AM and was brought to the ER.  The exact circumstances of the fall is not clear.  Patient did not lose consciousness.  In the ED CT abdomen pelvis was done which shows features concerning for acute cholecystitis with choledocholithiasis and pancreatitis.  ER physician started patient on fluids and empiric antibiotics and admitted for further management of acute pancreatitis likely biliary in origin.  Patient denies drinking alcohol.  Labs otherwise showed WBC of 19.8 hemoglobin 12.5.  Lactic acid was normal.  Assessment & Plan:   Principal Problem:   Acute pancreatitis Active Problems:   Hypertension   Acute pancreatitis Rule out acute cholecystitis -GI, surgery to follow along, appreciate insight and recommendations  -MRCP showed signs of cholecystitis with questionable intra and extrahepatic ductal dilation without overt signs of choledocholithiasis or obstruction -ERCP pending -Repeat labs pending  Profound hypovolemic hyponatremia  -Resolving with IV fluids confirming likely hypovolemic in etiology; wife indicates poor p.o. intake at bedside -Urine sodium reassuringly low; cortisol level elevated  Essential hypertension  -Continue carvedilol    DVT prophylaxis: SCDs avoiding anticoagulation in anticipation of possible procedure. Code Status: Full code. Family Communication: Wife at bedside  Status is: Inpatient  Dispo: The patient is from: Home              Anticipated  d/c is to: To be determined              Anticipated d/c date is: 48 to 72 hours              Patient currently not medically stable for discharge  Consultants:  GI, general surgery  Procedures:  ERCP pending  Antimicrobials:  Zosyn, ongoing  Subjective: No acute issues or events overnight, denies headache fever chills shortness of breath or chest pain, admits to generalized nausea without vomiting and right upper quadrant abdominal pain with palpation  Objective: Vitals:   11/11/20 0530 11/11/20 0630 11/11/20 0645 11/11/20 0700  BP: 123/72 128/79 121/73 115/80  Pulse: 70 68 70 68  Resp:    18  Temp:      TempSrc:      SpO2: 92% 94% 96% 96%    Intake/Output Summary (Last 24 hours) at 11/11/2020 0737 Last data filed at 11/10/2020 1829 Gross per 24 hour  Intake 1000 ml  Output --  Net 1000 ml   There were no vitals filed for this visit.  Examination:  General:  Pleasantly resting in bed, No acute distress. HEENT:  Normocephalic atraumatic.  Sclerae nonicteric, noninjected.  Extraocular movements intact bilaterally. Neck:  Without mass or deformity.  Trachea is midline. Lungs:  Clear to auscultate bilaterally without rhonchi, wheeze, or rales. Heart:  Regular rate and rhythm.  Without murmurs, rubs, or gallops. Abdomen:  Soft, moderately tender PMI right upper quadrant.  Without guarding or rebound. Extremities: Without cyanosis, clubbing, edema, or obvious deformity. Vascular:  Dorsalis pedis and posterior tibial pulses palpable bilaterally.  Skin:  Warm and dry, no erythema, no ulcerations.   Data Reviewed: I have personally reviewed following labs and imaging studies  CBC: Recent Labs  Lab 11/10/20 1518  WBC 19.8*  NEUTROABS 18.0*  HGB 12.5*  HCT 37.7*  MCV 94.5  PLT 169   Basic Metabolic Panel: Recent Labs  Lab 11/10/20 1518 11/11/20 0057  NA 122* 126*  K 4.0 4.1  CL 90* 93*  CO2 24 23  GLUCOSE 114* 102*  BUN 17 11  CREATININE 1.04 0.88  CALCIUM  9.2 9.0   GFR: CrCl cannot be calculated (Unknown ideal weight.). Liver Function Tests: Recent Labs  Lab 11/10/20 1631  AST 78*  ALT 61*  ALKPHOS 45  BILITOT 2.1*  PROT 6.5  ALBUMIN 3.4*   Recent Labs  Lab 11/10/20 1631  LIPASE 379*   No results for input(s): AMMONIA in the last 168 hours. Coagulation Profile: No results for input(s): INR, PROTIME in the last 168 hours. Cardiac Enzymes: No results for input(s): CKTOTAL, CKMB, CKMBINDEX, TROPONINI in the last 168 hours. BNP (last 3 results) No results for input(s): PROBNP in the last 8760 hours. HbA1C: No results for input(s): HGBA1C in the last 72 hours. CBG: No results for input(s): GLUCAP in the last 168 hours. Lipid Profile: No results for input(s): CHOL, HDL, LDLCALC, TRIG, CHOLHDL, LDLDIRECT in the last 72 hours. Thyroid Function Tests: Recent Labs    11/11/20 0057  TSH 1.847   Anemia Panel: No results for input(s): VITAMINB12, FOLATE, FERRITIN, TIBC, IRON, RETICCTPCT in the last 72 hours. Sepsis Labs: Recent Labs  Lab 11/10/20 1716  LATICACIDVEN 1.1    Recent Results (from the past 240 hour(s))  Blood culture (routine x 2)     Status: None (Preliminary result)   Collection Time: 11/10/20  4:34 PM   Specimen: BLOOD  Result Value Ref Range Status   Specimen Description BLOOD SITE NOT SPECIFIED  Final   Special Requests   Final    BOTTLES DRAWN AEROBIC AND ANAEROBIC Blood Culture adequate volume   Culture   Final    NO GROWTH < 12 HOURS Performed at [facility], [address], [place], [place] [address]    Report Status PENDING  Incomplete  Blood culture (routine x 2)     Status: None (Preliminary result)   Collection Time: 11/10/20  5:16 PM   Specimen: BLOOD  Result Value Ref Range Status   Specimen Description BLOOD BLOOD RIGHT FOREARM  Final   Special Requests   Final    BOTTLES DRAWN AEROBIC AND ANAEROBIC Blood Culture adequate volume   Culture   Final    NO GROWTH < 12  HOURS Performed at [facility], [address] [place], [place] [address]    Report Status PENDING  Incomplete  Resp Panel by RT-PCR (Flu A&B, Covid) Nasopharyngeal Swab     Status: None   Collection Time: 11/10/20  6:29 PM   Specimen: Nasopharyngeal Swab; Nasopharyngeal(NP) swabs in vial transport medium  Result Value Ref Range Status   SARS Coronavirus 2 by RT PCR NEGATIVE NEGATIVE Final    Comment: (NOTE) SARS-CoV-2 target nucleic acids are NOT DETECTED.  The SARS-CoV-2 RNA is generally detectable in upper respiratory specimens during the acute phase of infection. The lowest concentration of SARS-CoV-2 viral copies this assay can detect is 138 copies/mL. A negative result does not preclude SARS-Cov-2 infection and should not be used as the sole basis for treatment or other patient management decisions.  A negative result may occur with  improper specimen collection/handling, submission of specimen other than nasopharyngeal swab, presence of viral mutation(s) within the areas targeted by this assay, and inadequate number of viral copies(<138 copies/mL). A negative result must be combined with clinical observations, patient history, and epidemiological information. The expected result is Negative.  Fact Sheet for Patients:  BloggerCourse.com  Fact Sheet for Healthcare Providers:  SeriousBroker.it  This test is no t yet approved or cleared by the [place] FDA and  has been authorized for detection and/or diagnosis of SARS-CoV-2 by FDA under an Emergency Use Authorization (EUA). This EUA will remain  in effect (meaning this test can be used) for the duration of the COVID-19 declaration under Section 564(b)(1) of the Act, 21 U.S.C.section 360bbb-3(b)(1), unless the authorization is terminated  or revoked sooner.       Influenza A by PCR NEGATIVE NEGATIVE Final   Influenza B by PCR NEGATIVE NEGATIVE Final    Comment:  (NOTE) The Xpert Xpress SARS-CoV-2/FLU/RSV plus assay is intended as an aid in the diagnosis of influenza from Nasopharyngeal swab specimens and should not be used as a sole basis for treatment. Nasal washings and aspirates are unacceptable for Xpert Xpress SARS-CoV-2/FLU/RSV testing.  Fact Sheet for Patients: BloggerCourse.com  Fact Sheet for Healthcare Providers: SeriousBroker.it  This test is not yet approved or cleared by the [place] FDA and has been authorized for detection and/or diagnosis of SARS-CoV-2 by FDA under an Emergency Use Authorization (EUA). This EUA will remain in effect (meaning this test can be used) for the duration of the COVID-19 declaration under Section 564(b)(1) of the Act, 21 U.S.C. section 360bbb-3(b)(1), unless the authorization is terminated or revoked.  Performed at [facility], [address] [place], [place] [address]          Radiology Studies: DG Ribs Unilateral W/Chest Right  Result Date: 11/10/2020 CLINICAL DATA:  Fall, generalized weakness EXAM: RIGHT RIBS AND CHEST - 3+ VIEW COMPARISON:  Radiograph 05/15/2011 FINDINGS: Unchanged, chronically elevated right hemidiaphragm with right basilar atelectasis. No new focal airspace consolidation. No visible pneumothorax unchanged cardiomediastinal silhouette with aortic calcification. There is no evidence of displaced rib fracture. IMPRESSION: No evidence of displaced rib fracture. Unchanged, chronically elevated right hemidiaphragm with right basilar atelectasis. Electronically Signed   By: Caprice Renshaw   On: 11/10/2020 15:43   CT ABDOMEN PELVIS W CONTRAST  Result Date: 11/10/2020 CLINICAL DATA:  Multiple falls today. Increased weakness. Chills today and yesterday. EXAM: CT ABDOMEN AND PELVIS WITH CONTRAST TECHNIQUE: Multidetector CT imaging of the abdomen and pelvis was performed using the standard protocol following bolus  administration of intravenous contrast. CONTRAST:  OMNIPAQUE IOHEXOL 300 MG/ML  SOLN COMPARISON:  None. FINDINGS: Lower chest: Atelectasis or consolidation in the right lung base. No effusion. Elevation of the right hemidiaphragm. Hepatobiliary: Subcentimeter low-attenuation lesions in the lateral segment left lobe of liver, likely cysts. The gallbladder is distended with mild gallbladder wall thickening and edema. Mild extrahepatic bile duct dilatation. Suggestion of small stones in the gallbladder. No definitive common duct stones. Appearance suggest cholecystitis. Pancreas: Pancreas is mildly enlarged with mild pancreatic ductal dilatation. Inflammation in the peripancreatic fat. Prominence of the head of the pancreas. Changes likely represent early acute pancreatitis. No collections are identified. Follow-up after resolution of acute process is recommended to exclude underlying pancreatic head mass. Spleen: Normal in size without focal abnormality. Adrenals/Urinary Tract: Adrenal glands are unremarkable. Kidneys are normal, without renal calculi, focal lesion, or  hydronephrosis. Bladder is diffusely distended with irregular and trabeculated bladder wall and multiple small bladder diverticula. Changes suggest chronic outlet obstruction. No wall thickening. Stomach/Bowel: Stomach, small bowel, and colon are not abnormally distended although there is scattered fluid in the small bowel. No wall thickening or inflammatory changes are appreciated. Scattered stool in the colon. Scattered colonic diverticula without evidence of diverticulitis. Appendix is not identified. Vascular/Lymphatic: Aortic atherosclerosis. No enlarged abdominal or pelvic lymph nodes. Reproductive: Prostate gland is enlarged, measuring 5.8 cm diameter. Other: No free air or free fluid in the abdomen. Abdominal wall musculature appears intact. Musculoskeletal: Degenerative changes in the spine and hips. Lumbar scoliosis convex towards the  left. IMPRESSION: 1. Distended gallbladder with mild edema and small stones. Bile duct dilatation. Changes may represent cholecystitis or biliary obstruction. 2. Inflammatory changes involving the pancreas with peripancreatic edema likely representing acute pancreatitis. There is fullness in the head of the pancreas and follow-up after resolution of acute process is recommended to exclude underlying pancreatic mass. 3. Enlarged prostate gland with distended bladder. 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 4. Atelectasis or consolidation in the right lung base. Electronically Signed   By: Burman NievesWilliam  Stevens M.D.   On: 11/10/2020 19:49   DG Hip Unilat W or Wo Pelvis 2-3 Views Right  Result Date: 11/10/2020 CLINICAL DATA:  Fall and pain. EXAM: DG HIP (WITH OR WITHOUT PELVIS) 2-3V RIGHT COMPARISON:  None. FINDINGS: AP view of the pelvis and AP/frog leg views of the right hip. Femoral heads are located. Marked osteoarthritis involving the weight-bearing surface of the right hip. No acute fracture. Mild osteopenia. IMPRESSION: Marked right hip osteoarthritis, without acute superimposed process. Given the severity of osteopenia, if there is a strong clinical concern of fracture, consider further evaluation with MRI. Electronically Signed   By: Jeronimo GreavesKyle  Talbot M.D.   On: 11/10/2020 17:13    Scheduled Meds: Continuous Infusions:  lactated ringers 150 mL/hr at 11/10/20 2239   piperacillin-tazobactam (ZOSYN)  IV Stopped (11/11/20 0549)     LOS: 1 day   Time spent: 40min  Azucena FallenWilliam C Jaydence Vanyo, DO [facility]  If 7PM-7AM, please contact night-coverage [email or web address]  11/11/2020, 7:37 AM

## 2020-11-11 NOTE — Consult Note (Signed)
Referring Provider: Dr. Avon Gully  Primary Care Physician:  Bernerd Limbo, MD Primary Gastroenterologist:  Dr. Lilyan Gilford [facility]  Reason for Consultation: Pancreatitis  HPI: Brian Ruiz is a 75 y.o. male with a past medical history of arthritis, chronic back pain, hypertension, upper GI bleed secondary to PUD 05/2019.  Past thyroidectomy and left inguinal hernia repair.  He went to the bathroom to urinate at 4 AM Tuesday, 11/10/2020.  While standing at the commode he fell and hit his right rib cage on the toilet and fell to the floor.  He stated it took him about 40 minutes to pull himself up off the floor.  His wife called EMS and they assessed he was stable and did not require urgent evaluation in the ED.  He ate breakfast and went back to bed for a few hours.  He stood up at the bedside and felt profoundly weak and he fell to the floor.  He hit his head on the wall as he landed on the floor.  He is unsure if he lost consciousness.  His wife called EMS and he was transported to [facility] for further evaluation. Labs in the ED showed a WBC count 19.8.  Hemoglobin 12.5.  Hematocrit 37.7.  Sodium 122.  Alk phos 45.  AST 78.  ALT 61.  Total bili 2.1.  Indirect bili 1.7.  Lipase 379.  SARS coronavirus 2 negative.  CTAP with contrast showed a distended gallbladder with mild edema and small gallstones with bile duct dilatation possibly representing cholecystitis or biliary obstruction.  Evidence of pancreatitis was noted with inflammatory changes involving the pancreas with peripancreatic edema with fullness in the head of the pancreas, an underlying mass could not be excluded.  Multiple bladder diverticula and cellules formulation consistent with chronic bladder outlet obstruction was also noted.  An abdominal MRI/MRCP with and without contrast today (motion artifact noted) showed a distended gallbladder with gallbladder wall thickening, pericholecystic fluid consistent with acute  cholecystitis, intra and extrahepatic biliary duct dilatation with abrupt tapering of the CBD at the level of the ampulla with a mild prominence of the main pancreatic duct.  No evidence of choledocholithiasis identified.  He denies having any prior history of pancreatitis.  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  No drug use.  No history of hypertriglyceridemia.  No family history of pancreatic or liver disease/cancer.  He has mild heartburn approximately 2 days weekly for the past few months.  He takes Omeprazole 27m QD. No dysphagia.  He complains of having epigastric and right upper quadrant abdominal pain which he attributes to falling on his right side. He typically passes a normal formed brown bowel movement once daily.  No rectal bleeding or melena.  He has a history of upper GI bleed/melena 05/2019.  At that time his hemoglobin level was 5.8 and he declined hospital admission.  He received 2 units of packed red blood cells as an outpatient, started on PPI and oral iron.  An EGD was done by Dr. SLilyan Gilford1/20/2021, he reported gastric ulcers were identified secondary to NSAID use.  I am unable to retrieve his EGD records from epic[facility]care everywhere at this time.  He underwent a screening colonoscopy approximately 20 years ago which she reported was normal.  No weight loss.  He reports having chills and night sweats for the past 6 to 12 months.  Past smoker.  Abdominal MRI/MRCP with and  without contrast 11/11/2020: Examination is limited by excessive motion artifact on all sequences significantly limiting the sensitivity and specificity of the examination, with the postcontrast sequences being essentially nondiagnostic. Within this context:  1. Distended gallbladder with gallbladder wall thickening, pericholecystic fluid and mural hyperenhancement, findings most compatible with acute cholecystitis. 2. Intra and extrahepatic biliary ductal  dilation with fairly abrupt tapering of the CBD at the level of the ampulla. Also there is mild prominence of the main pancreatic duct but with a pancreatic parenchyma to duct ratio which appears within normal limits for patient's age. No convincing evidence of choledocholithiasis or other obstructing etiology confidently identified. Given the limitations on today's examination and the above findings, further evaluation with EUS/ERCP may be warranted to assess for discrete obstructive etiology. Additionally, consider follow-up pancreatic protocol MRI with and without contrast (preferably as an outpatient) for further evaluation, when patient is stable and better able to follow commands including breath hold. 3. Findings of acute pancreatitis with small volume peripancreatic fluid. No drainable walled off fluid collection. 4. 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   CTAP with contrast 11/10/2020: 1. Distended gallbladder with mild edema and small stones. Bile duct dilatation. Changes may represent cholecystitis or biliary obstruction. 2. Inflammatory changes involving the pancreas with peripancreatic edema likely representing acute pancreatitis. There is fullness in the head of the pancreas and follow-up after resolution of acute process is recommended to exclude underlying pancreatic mass. 3. Enlarged prostate gland with distended bladder. 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 4. Atelectasis or consolidation in the right lung base.  Echo 08/14/2017: - Left ventricle: The cavity size was normal. Wall thickness was    normal. Systolic function was normal. The estimated ejection    fraction was in the range of 55% to 60%. Features are consistent    with a pseudonormal left ventricular filling pattern, with     concomitant abnormal relaxation and increased filling pressure    (grade 2 diastolic dysfunction).  - Aortic valve: There was mild regurgitation.  - Mitral valve: There was mild regurgitation.   Past Medical History:  Diagnosis Date   Arthritis    Back pain, chronic    intolerant to narcotics, avoids NSAIDs due to vioxx related bleed,  sees pain clinic as [place], prior eval by [facility] neuro in [place]   Heart murmur    Hypertension    Inguinal hernia    Polyneuropathy    S/P thyroidectomy 1967   Scoliosis    Urinary retention     Past Surgical History:  Procedure Laterality Date   [place], 2000   due to arthritis   HAND SURGERY  2005   left - due to arthritis   INGUINAL HERNIA REPAIR  04/30/2012   Procedure: HERNIA REPAIR INGUINAL ADULT;  Surgeon: Haywood Lasso, MD;  Location: [place];  Service: General;  Laterality: Left;  repair left inguinal hernia   THYROIDECTOMY  1967   TONSILLECTOMY      Prior to Admission medications   Medication Sig Start Date End Date Taking? Authorizing Provider  acetaminophen (TYLENOL) 500 MG tablet Take 1,000 mg by mouth every 8 (eight) hours as needed. For pain.    Yes [provider]  Artificial Tear Ointment (DRY EYES OP) Apply 1 drop to eye  daily as needed (dry eyes).   Yes [provider]  carvedilol (COREG) 25 MG tablet Take 1 tablet (25 mg total) by mouth 2 (two) times daily. Please make overdue appt with Dr. Acie Fredrickson before anymore refills. 1st attempt Patient taking differently: Take 25 mg by mouth 2 (two) times daily. Pt takes at 11 am and 11 pm 08/12/19  Yes Nahser, Wonda Cheng, MD  omeprazole (PRILOSEC) 40 MG capsule Take 40 mg by mouth daily. 09/07/20  Yes [provider]  sertraline (ZOLOFT) 25 MG tablet Take 25 mg by mouth 3 (three) times daily.   Yes [provider]    Current Facility-Administered Medications  Medication  Dose Route Frequency Provider Last Rate Last Admin   carvedilol (COREG) tablet 25 mg  25 mg Oral BID WC Little Ishikawa, MD       fentaNYL (SUBLIMAZE) injection 25 mcg  25 mcg Intravenous Q2H PRN Rise Patience, MD   25 mcg at 11/11/20 0819   labetalol (NORMODYNE) injection 10 mg  10 mg Intravenous Q2H PRN Rise Patience, MD       lactated ringers infusion   Intravenous Continuous Rise Patience, MD 150 mL/hr at 11/10/20 2239 New Bag at 11/10/20 2239   pantoprazole (PROTONIX) EC tablet 40 mg  40 mg Oral Daily Little Ishikawa, MD       piperacillin-tazobactam (ZOSYN) IVPB 3.375 g  3.375 g Intravenous Q8H Rise Patience, MD 12.5 mL/hr at 11/11/20 0943 3.375 g at 11/11/20 0943   sertraline (ZOLOFT) tablet 25 mg  25 mg Oral TID Little Ishikawa, MD       Current Outpatient Medications  Medication Sig Dispense Refill   acetaminophen (TYLENOL) 500 MG tablet Take 1,000 mg by mouth every 8 (eight) hours as needed. For pain.      Artificial Tear Ointment (DRY EYES OP) Apply 1 drop to eye daily as needed (dry eyes).     carvedilol (COREG) 25 MG tablet Take 1 tablet (25 mg total) by mouth 2 (two) times daily. Please make overdue appt with Dr. Acie Fredrickson before anymore refills. 1st attempt (Patient taking differently: Take 25 mg by mouth 2 (two) times daily. Pt takes at 11 am and 11 pm) 60 tablet 0   omeprazole (PRILOSEC) 40 MG capsule Take 40 mg by mouth daily.     sertraline (ZOLOFT) 25 MG tablet Take 25 mg by mouth 3 (three) times daily.      Allergies as of 11/10/2020 - Review Complete 11/10/2020  Allergen Reaction Noted   Aspirin Shortness Of Breath 06/30/2011   Nsaids Shortness Of Breath 06/30/2011   Oxycodone Other (See Comments) 05/15/2011   Tolmetin Shortness Of Breath 04/23/2012   Tramadol Anaphylaxis 11/26/2013   Codeine Anxiety 06/30/2011    Family History  Problem Relation Age of Onset   Heart disease Father     Social History   Socioeconomic  History   Marital status: Married    Spouse name: Not on file   Number of children: Not on file   Years of education: Not on file   Highest education level: Not on file  Occupational History   Not on file  Tobacco Use   Smoking status: Never   Smokeless tobacco: Never  Vaping Use   Vaping Use: Never used  Substance and Sexual Activity   Alcohol use: No   Drug use: No   Sexual activity: Yes    Partners: Female  Other Topics Concern   Not on  file  Social History Narrative   Quit ETOH > 20years ago   Disabled due to back pain   Non-smoker   Lives at home   Wife Education administrator   Son +   Social Determinants of Health   Financial Resource Strain: Not on file  Food Insecurity: Not on file  Transportation Needs: Not on file  Physical Activity: Not on file  Stress: Not on file  Social Connections: Not on file  Intimate Partner Violence: Not on file    Review of Systems: Gen: Denies fever, sweats or chills. No weight loss.  CV: Denies chest pain, palpitations or edema. Resp: Denies cough, shortness of breath of hemoptysis.  GI: Denies heartburn, dysphagia, stomach or lower abdominal pain. No diarrhea or constipation. No rectal bleeding or melena.   GU : Denies urinary burning, blood in urine, increased urinary frequency or incontinence. MS: Denies joint pain, muscles aches or weakness. Derm: Denies rash, itchiness, skin lesions or unhealing ulcers. Psych: Denies depression, anxiety, memory loss, suicidal ideation or confusion. Heme: Denies easy bruising, bleeding. Neuro:  Denies headaches, dizziness or paresthesias. Endo:  Denies any problems with DM, thyroid or adrenal function.  Physical Exam: Vital signs in last 24 hours: Temp:  [98.8 F (37.1 C)] 98.8 F (37.1 C) (06/14 1406) Pulse Rate:  [67-92] 78 (06/15 1005) Resp:  [18-30] 18 (06/15 1005) BP: (108-174)/(66-117) 160/86 (06/15 1000) SpO2:  [87 %-97 %] 93 % (06/15 1005)   General:  Alert, anxious male in NAD. Head:   Normocephalic and atraumatic. Eyes:  No scleral icterus. Conjunctiva pink. Ears:  Normal auditory acuity. Nose:  No deformity, discharge or lesions. Mouth:  Dentition intact. No ulcers or lesions.  Neck:  Supple. No lymphadenopathy or thyromegaly.  Lungs: Breath sounds clear throughout. Heart: Regular rate and rhythm, no murmurs. Abdomen: Soft, epigastric and right upper quadrant tenderness without rebound or guarding.  Upper quadrant somewhat protrudes without palpable mass.  The bowel sounds to all 4 quadrants Rectal: Deferred. Musculoskeletal:  Symmetrical without gross deformities.  Pulses:  Normal pulses noted. Extremities:  Without clubbing or edema. Neurologic:  Alert and  oriented x4. No focal deficits.  Skin:  Intact without significant lesions or rashes. Psych:  Alert and cooperative. Normal mood and affect.  Intake/Output from previous day: 06/14 0701 - 06/15 0700 In: 1000 [IV Piggyback:1000] Out: -  Intake/Output this shift: No intake/output data recorded.  Lab Results: Recent Labs    11/10/20 1518 11/11/20 0830  WBC 19.8* 16.3*  HGB 12.5* 12.4*  HCT 37.7* 37.2*  PLT 169 145*   BMET Recent Labs    11/10/20 1518 11/11/20 0057 11/11/20 0830  NA 122* 126* 125*  K 4.0 4.1 3.9  CL 90* 93* 96*  CO2 24 23 21*  GLUCOSE 114* 102* 93  BUN 17 11 9   CREATININE 1.04 0.88 0.75  CALCIUM 9.2 9.0 8.6*   LFT Recent Labs    11/11/20 0830  PROT 6.0*  ALBUMIN 3.3*  AST 54*  ALT 50*  ALKPHOS 39  BILITOT 1.1  BILIDIR 0.3*  IBILI 0.8   PT/INR No results for input(s): LABPROT, INR in the last 72 hours. Hepatitis Panel No results for input(s): HEPBSAG, HCVAB, HEPAIGM, HEPBIGM in the last 72 hours.    Studies/Results: DG Ribs Unilateral W/Chest Right  Result Date: 11/10/2020 CLINICAL DATA:  Fall, generalized weakness EXAM: RIGHT RIBS AND CHEST - 3+ VIEW COMPARISON:  Radiograph 05/15/2011 FINDINGS: Unchanged, chronically elevated right hemidiaphragm with right  basilar atelectasis. No new  focal airspace consolidation. No visible pneumothorax unchanged cardiomediastinal silhouette with aortic calcification. There is no evidence of displaced rib fracture. IMPRESSION: No evidence of displaced rib fracture. Unchanged, chronically elevated right hemidiaphragm with right basilar atelectasis. Electronically Signed   By: Maurine Simmering   On: 11/10/2020 15:43   CT ABDOMEN PELVIS W CONTRAST  Result Date: 11/10/2020 CLINICAL DATA:  Multiple falls today. Increased weakness. Chills today and yesterday. EXAM: CT ABDOMEN AND PELVIS WITH CONTRAST TECHNIQUE: Multidetector CT imaging of the abdomen and pelvis was performed using the standard protocol following bolus administration of intravenous contrast. CONTRAST:  119m OMNIPAQUE IOHEXOL 300 MG/ML  SOLN COMPARISON:  None. FINDINGS: Lower chest: Atelectasis or consolidation in the right lung base. No effusion. Elevation of the right hemidiaphragm. Hepatobiliary: Subcentimeter low-attenuation lesions in the lateral segment left lobe of liver, likely cysts. The gallbladder is distended with mild gallbladder wall thickening and edema. Mild extrahepatic bile duct dilatation. Suggestion of small stones in the gallbladder. No definitive common duct stones. Appearance suggest cholecystitis. Pancreas: Pancreas is mildly enlarged with mild pancreatic ductal dilatation. Inflammation in the peripancreatic fat. Prominence of the head of the pancreas. Changes likely represent early acute pancreatitis. No collections are identified. Follow-up after resolution of acute process is recommended to exclude underlying pancreatic head mass. Spleen: Normal in size without focal abnormality. Adrenals/Urinary Tract: Adrenal glands are unremarkable. Kidneys are normal, without renal calculi, focal lesion, or hydronephrosis. Bladder is diffusely distended with irregular and trabeculated bladder wall and multiple small bladder diverticula. Changes suggest chronic  outlet obstruction. No wall thickening. Stomach/Bowel: Stomach, small bowel, and colon are not abnormally distended although there is scattered fluid in the small bowel. No wall thickening or inflammatory changes are appreciated. Scattered stool in the colon. Scattered colonic diverticula without evidence of diverticulitis. Appendix is not identified. Vascular/Lymphatic: Aortic atherosclerosis. No enlarged abdominal or pelvic lymph nodes. Reproductive: Prostate gland is enlarged, measuring 5.8 cm diameter. Other: No free air or free fluid in the abdomen. Abdominal wall musculature appears intact. Musculoskeletal: Degenerative changes in the spine and hips. Lumbar scoliosis convex towards the left. IMPRESSION: 1. Distended gallbladder with mild edema and small stones. Bile duct dilatation. Changes may represent cholecystitis or biliary obstruction. 2. Inflammatory changes involving the pancreas with peripancreatic edema likely representing acute pancreatitis. There is fullness in the head of the pancreas and follow-up after resolution of acute process is recommended to exclude underlying pancreatic mass. 3. Enlarged prostate gland with distended bladder. 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 4. Atelectasis or consolidation in the right lung base. Electronically Signed   By: WLucienne CapersM.D.   On: 11/10/2020 19:49   MR ABDOMEN MRCP W WO CONTAST  Result Date: 11/11/2020 CLINICAL DATA:  Acute severe pancreatitis assess for etiology. EXAM: MRI ABDOMEN WITHOUT AND WITH CONTRAST (INCLUDING MRCP) TECHNIQUE: Multiplanar multisequence MR imaging of the abdomen was performed both before and after the administration of intravenous contrast. Heavily T2-weighted images of the biliary and pancreatic ducts were obtained, and three-dimensional MRCP images were rendered by post processing. CONTRAST:  8.835mGADAVIST GADOBUTROL 1 MMOL/ML IV SOLN COMPARISON:  Same  day CT. FINDINGS: Despite efforts by the technologist and patient, excessive motion artifact is present on today's exam and could not be eliminated. This reduces exam sensitivity and specificity. Lower chest: Right lung base atelectasis. Elevation the right hemidiaphragm. Hepatobiliary: Hepatic cysts measuring up to 1.4 cm. No suspicious enhancing hepatic lesion identified. Gallbladder is distended with wall thickening, pericholecystic fluid  and mucosal hyperenhancement. No gallbladder stone visualized within the lumen of gallbladder. There is intra and extrahepatic biliary ductal dilation with the common duct measuring up to 15 cm. Common duct appears dilated throughout its course with fairly abrupt tapering of the CBD at the level of the ampulla. No convincing filling defects visualized within the biliary tree to suggest choledocholithiasis. Pancreas: Although degraded by motion artifact there appears to be subtle loss of intrinsic T1 signal intensity in the pancreatic parenchyma with T2 hyperintensity involving the tail of the pancreas. Small volume peripancreatic fluid without drainable walled off collection. No definite areas of pancreatic hypoenhancement although evaluation is significantly limited by motion artifact. The main pancreatic duct appears prominent but within normal limits by measurements, evaluation of which is limited by motion. There the ratio of duct to parenchyma appears to be within normal limits for patient's age. There is no discrete mass in head of the pancreas visualized however evaluation for this is significantly limited by patient motion Spleen:  Within normal limits Adrenals/Urinary Tract: Bilateral adrenal glands are unremarkable. No solid enhancing renal masses. Trabecular appearance of the visualized portions urinary bladder wall with multiple small diverticula, suggestive of chronic outflow impedance. Stomach/Bowel: Visualized portions within the abdomen are unremarkable.  Vascular/Lymphatic: No pathologically enlarged lymph nodes identified. No abdominal aortic aneurysm demonstrated. Other: No pneumoperitoneum. No drainable walled off fluid collections. Musculoskeletal: No suspicious bone lesions identified. IMPRESSION: Examination is limited by excessive motion artifact on all sequences significantly limiting the sensitivity and specificity of the examination, with the postcontrast sequences being essentially nondiagnostic. Within this context: 1. Distended gallbladder with gallbladder wall thickening, pericholecystic fluid and mural hyperenhancement, findings most compatible with acute cholecystitis. 2. Intra and extrahepatic biliary ductal dilation with fairly abrupt tapering of the CBD at the level of the ampulla. Also there is mild prominence of the main pancreatic duct but with a pancreatic parenchyma to duct ratio which appears within normal limits for patient's age. No convincing evidence of choledocholithiasis or other obstructing etiology confidently identified. Given the limitations on today's examination and the above findings, further evaluation with EUS/ERCP may be warranted to assess for discrete obstructive etiology. Additionally, consider follow-up pancreatic protocol MRI with and without contrast (preferably as an outpatient) for further evaluation, when patient is stable and better able to follow commands including breath hold. 3. Findings of acute pancreatitis with small volume peripancreatic fluid. No drainable walled off fluid collection. 4. 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 Electronically Signed   By: Dahlia Bailiff MD   On: 11/11/2020 08:46   DG Hip Unilat W or Wo Pelvis 2-3 Views Right  Result Date: 11/10/2020 CLINICAL DATA:  Fall and pain. EXAM: DG HIP (WITH OR WITHOUT PELVIS) 2-3V RIGHT COMPARISON:  None. FINDINGS: AP view of the pelvis and  AP/frog leg views of the right hip. Femoral heads are located. Marked osteoarthritis involving the weight-bearing surface of the right hip. No acute fracture. Mild osteopenia. IMPRESSION: Marked right hip osteoarthritis, without acute superimposed process. Given the severity of osteopenia, if there is a strong clinical concern of fracture, consider further evaluation with MRI. Electronically Signed   By: Abigail Miyamoto M.D.   On: 11/10/2020 17:13    IMPRESSION/PLAN:  3. 75 year old male admitted to the hospital with generalized weakness and falling at home x 2 on 11/10/2020. CTAP with contrast showed a distended gallbladder with mild edema and small gallstones with bile duct dilatation  possibly representing cholecystitis or biliary obstruction.  Evidence of pancreatitis was noted with inflammatory changes involving the pancreas with peripancreatic edema with fullness in the head of the pancreas, an underlying mass could not be excluded. An abdominal MRI/MRCP with and without contrast today showed a distended gallbladder with gallbladder wall thickening, periholecystic fluid consistent with acute cholecystitis, intra and extrahepatic biliary duct dilatation with abrupt tapering of the CBD at the level of the ampulla with a mild prominence of the main pancreatic duct.  No evidence of choledocholithiasis identified.  Lipase 379.  Total bili 2.1 with indirect greater than direct.  Alk phos 45.  AST 78.  ALT 61. -Likely will require a laparoscopic cholecystectomy during hospital admission  -Await further recommendations from general surgery -Abdominal MRI/MRCP without evidence of choledocholithiasis or biliary obstruction. His LFTs are trending downward therefore no plans for EUS/ERCP at this time. Await further recommendations from Dr. Rush Landmark. -Continue IV fluid -Agree with IV Zoxyn -Clear liquid diet -PPI IV QD -CBC, hepatic panel, BMP and triglyceride level in am   2. Leukocytosis, secondary to #  1  3. History of PUD with GIB secondary to NSAIDs  05/2019   Noralyn Pick  11/11/2020, 1:46 PM

## 2020-11-12 ENCOUNTER — Encounter (HOSPITAL_COMMUNITY): Payer: Self-pay | Admitting: Anesthesiology

## 2020-11-12 ENCOUNTER — Encounter (HOSPITAL_COMMUNITY)
Admission: EM | Disposition: A | Discharge status: Home Health | Payer: Self-pay | Source: Home / Self Care | Attending: Internal Medicine

## 2020-11-12 LAB — COMPREHENSIVE METABOLIC PANEL
ALT: 45 U/L — ABNORMAL HIGH (ref 0–44)
AST: 63 U/L — ABNORMAL HIGH (ref 15–41)
Albumin: 3.2 g/dL — ABNORMAL LOW (ref 3.5–5.0)
Alkaline Phosphatase: 46 U/L (ref 38–126)
Anion gap: 10 (ref 5–15)
BUN: 9 mg/dL (ref 8–23)
CO2: 24 mmol/L (ref 22–32)
Calcium: 8.8 mg/dL — ABNORMAL LOW (ref 8.9–10.3)
Chloride: 94 mmol/L — ABNORMAL LOW (ref 98–111)
Creatinine, Ser: 0.85 mg/dL (ref 0.61–1.24)
GFR, Estimated: >60 mL/min (ref >60)
Glucose, Bld: 78 mg/dL (ref 70–99)
Potassium: 3.6 mmol/L (ref 3.5–5.1)
Sodium: 128 mmol/L — ABNORMAL LOW (ref 135–145)
Total Bilirubin: 1 mg/dL (ref 0.3–1.2)
Total Protein: 5.8 g/dL — ABNORMAL LOW (ref 6.5–8.1)

## 2020-11-12 LAB — CBC
HCT: 37.5 % — ABNORMAL LOW (ref 39.0–52.0)
Hemoglobin: 12.4 g/dL — ABNORMAL LOW (ref 13.0–17.0)
MCH: 31.5 pg (ref 26.0–34.0)
MCHC: 33.1 g/dL (ref 30.0–36.0)
MCV: 95.2 fL (ref 80.0–100.0)
Platelets: 169 10*3/uL (ref 150–400)
RBC: 3.94 MIL/uL — ABNORMAL LOW (ref 4.22–5.81)
RDW: 13.2 % (ref 11.5–15.5)
WBC: 15.9 10*3/uL — ABNORMAL HIGH (ref 4.0–10.5)
nRBC: 0 % (ref 0.0–0.2)

## 2020-11-12 LAB — LIPASE, BLOOD: Lipase: 181 U/L — ABNORMAL HIGH (ref 11–51)

## 2020-11-12 SURGERY — UPPER ENDOSCOPIC ULTRASOUND (EUS) LINEAR
Anesthesia: General

## 2020-11-12 MED ORDER — SODIUM CHLORIDE 0.9 % IV SOLN: INTRAVENOUS | Status: AC

## 2020-11-12 NOTE — Progress Notes (Signed)
Brief GI progress note  Liver biochemical tests have been reviewed this morning.  Bilirubin is normal with normal alkaline phosphatase and overall normal AST/ALT.  Lipase continues to decrease.    At this point I do not think we need to pursue an EUS/ERCP but rather move forward with cholecystectomy with IOC when surgery sees able.  If something is found on IOC we will be happy to move forward with ERCP at that time.  The bile duct dilation without overt choledocholithiasis and normal alkaline phosphatase and normal bilirubin makes the likelihood of passage of a small stone more likely.  Without progressive pancreatic duct dilation, an underlying mass or lesion seems less likely.  Happy to discuss things further if necessary.  Corliss Parish, MD [facility] Office # [phone]

## 2020-11-12 NOTE — Progress Notes (Signed)
PROGRESS NOTE    Brian Ruiz  ZOX:[id number] DOB: Dec 23, 1945 DOA: 11/10/2020 PCP: Tracey Harries, MD   Brief Narrative:  Brian Ruiz is a 75 y.o. male with history of hypertension PVCs and peptic ulcer disease who has had GI bleed previously seen by gastroenterologist and colonoscopy last EGD was in January 2021 has had a fall today and was brought to the ER.  Patient had 2 falls 1 at 4 AM but at the time EMS advised coming to ER but patient refused.  Subsequent which patient had another fall at around 11 AM and was brought to the ER.  The exact circumstances of the fall is not clear.  Patient did not lose consciousness.  In the ED CT abdomen pelvis was done which shows features concerning for acute cholecystitis with choledocholithiasis and pancreatitis.  ER physician started patient on fluids and empiric antibiotics and admitted for further management of acute pancreatitis likely biliary in origin.  Patient denies drinking alcohol.  Labs otherwise showed WBC of 19.8 hemoglobin 12.5.  Lactic acid was normal.  Assessment & Plan:   Principal Problem:   Acute pancreatitis Active Problems:   Hypertension   Acute pancreatitis Rule out acute cholecystitis -GI, surgery to follow along, appreciate insight and recommendations  -MRCP showed signs of cholecystitis with questionable intra and extrahepatic ductal dilation without overt signs of choledocholithiasis or obstruction -ERCP discontinued -Repeat labs stable - lipase down -Likely lap chole in the next 24 to 48 hours pending surgical schedule, patient symptoms, and OR availability  Profound hypovolemic hyponatremia  -Resolving with IV fluids confirming likely hypovolemic in etiology; wife indicates poor p.o. intake at bedside previously, today she indicates the patient "drinks lots of water" -we will continue to follow, unclear now if this is poor p.o. intake and dehydration versus SIADH or inappropriate intake of excess water -Urine  sodium reassuringly low  Essential hypertension  -Continue carvedilol  DVT prophylaxis: SCDs avoiding anticoagulation in anticipation of possible procedure. Code Status: Full code. Family Communication: Wife at bedside  Status is: Inpatient  Dispo: The patient is from: Home              Anticipated d/c is to: To be determined              Anticipated d/c date is: 48 to 72 hours              Patient currently not medically stable for discharge  Consultants:  GI, general surgery  Procedures:  ERCP pending  Antimicrobials:  Zosyn, ongoing  Subjective: No acute issues or events overnight, somewhat anxious in preparation for procedure and surgery but otherwise denies nausea vomiting diarrhea constipation headache fevers or chills, abdominal pain currently well controlled.  Objective: Vitals:   11/11/20 1727 11/11/20 2034 11/12/20 0500 11/12/20 0612  BP: (!) 143/83 (!) 157/84  (!) 155/86  Pulse:  69  75  Resp: Temp:  98.8 F (37.1 C)  98.3 F (36.8 C)  TempSrc:  Oral  Oral  SpO2: 98% 98%  94%  Weight:   92 kg     Intake/Output Summary (Last 24 hours) at 11/12/2020 0823 Last data filed at 11/12/2020 0742 Gross per 24 hour  Intake 700.28 ml  Output 900 ml  Net -199.72 ml    Filed Weights   11/12/20 0500  Weight: 92 kg    Examination:  General:  Pleasantly resting in bed, No acute distress. HEENT:  Normocephalic atraumatic.  Sclerae nonicteric,  noninjected.  Extraocular movements intact bilaterally. Neck:  Without mass or deformity.  Trachea is midline. Lungs:  Clear to auscultate bilaterally without rhonchi, wheeze, or rales. Heart:  Regular rate and rhythm.  Without murmurs, rubs, or gallops. Abdomen:  Soft, moderately tender PMI right upper quadrant.  Without guarding or rebound. Extremities: Without cyanosis, clubbing, edema, or obvious deformity. Vascular:  Dorsalis pedis and posterior tibial pulses palpable bilaterally. Skin:  Warm and dry, no  erythema, no ulcerations.   Data Reviewed: I have personally reviewed following labs and imaging studies  CBC: Recent Labs  Lab 11/10/20 1518 11/11/20 0830 11/12/20 0055  WBC 19.8* 16.3* 15.9*  NEUTROABS 18.0*  --   --   HGB 12.5* 12.4* 12.4*  HCT 37.7* 37.2* 37.5*  MCV 94.5 94.4 95.2  PLT 169 145* 169    Basic Metabolic Panel: Recent Labs  Lab 11/10/20 1518 11/11/20 0057 11/11/20 0830 11/11/20 0841 11/12/20 0055  NA 122* 126* 125* 129* 128*  K 4.0 4.1 3.9 4.2 3.6  CL 90* 93* 96* 99 94*  CO2 24 23 21* 16* 24  GLUCOSE 114* 102* 93 75 78  BUN 17 11 9 11 9   CREATININE 1.04 0.88 0.75 0.75 0.85  CALCIUM 9.2 9.0 8.6* 8.7* 8.8*    GFR: CrCl cannot be calculated (Unknown ideal weight.). Liver Function Tests: Recent Labs  Lab 11/10/20 1631 11/11/20 0830 11/12/20 0055  AST 78* 54* 63*  ALT 61* 50* 45*  ALKPHOS 45 39 46  BILITOT 2.1* 1.1 1.0  PROT 6.5 6.0* 5.8*  ALBUMIN 3.4* 3.3* 3.2*    Recent Labs  Lab 11/10/20 1631 11/12/20 0055  LIPASE 379* 181*    No results for input(s): AMMONIA in the last 168 hours. Coagulation Profile: No results for input(s): INR, PROTIME in the last 168 hours. Cardiac Enzymes: No results for input(s): CKTOTAL, CKMB, CKMBINDEX, TROPONINI in the last 168 hours. BNP (last 3 results) No results for input(s): PROBNP in the last 8760 hours. HbA1C: No results for input(s): HGBA1C in the last 72 hours. CBG: No results for input(s): GLUCAP in the last 168 hours. Lipid Profile: No results for input(s): CHOL, HDL, LDLCALC, TRIG, CHOLHDL, LDLDIRECT in the last 72 hours. Thyroid Function Tests: Recent Labs    11/11/20 0057  TSH 1.847    Anemia Panel: No results for input(s): VITAMINB12, FOLATE, FERRITIN, TIBC, IRON, RETICCTPCT in the last 72 hours. Sepsis Labs: Recent Labs  Lab 11/10/20 1716  LATICACIDVEN 1.1     Recent Results (from the past 240 hour(s))  Blood culture (routine x 2)     Status: None (Preliminary result)    Collection Time: 11/10/20  4:34 PM   Specimen: BLOOD  Result Value Ref Range Status   Specimen Description BLOOD SITE NOT SPECIFIED  Final   Special Requests   Final    BOTTLES DRAWN AEROBIC AND ANAEROBIC Blood Culture adequate volume   Culture   Final    NO GROWTH 2 DAYS Performed at [facility], [address], [place], [place]    Report Status PENDING  Incomplete  Blood culture (routine x 2)     Status: None (Preliminary result)   Collection Time: 11/10/20  5:16 PM   Specimen: BLOOD  Result Value Ref Range Status   Specimen Description BLOOD BLOOD RIGHT FOREARM  Final   Special Requests   Final    BOTTLES DRAWN AEROBIC AND ANAEROBIC Blood Culture adequate volume   Culture   Final  NO GROWTH 2 DAYS Performed at [facility], [address] [place], [place] [address]    Report Status PENDING  Incomplete  Resp Panel by RT-PCR (Flu A&B, Covid) Nasopharyngeal Swab     Status: None   Collection Time: 11/10/20  6:29 PM   Specimen: Nasopharyngeal Swab; Nasopharyngeal(NP) swabs in vial transport medium  Result Value Ref Range Status   SARS Coronavirus 2 by RT PCR NEGATIVE NEGATIVE Final    Comment: (NOTE) SARS-CoV-2 target nucleic acids are NOT DETECTED.  The SARS-CoV-2 RNA is generally detectable in upper respiratory specimens during the acute phase of infection. The lowest concentration of SARS-CoV-2 viral copies this assay can detect is 138 copies/mL. A negative result does not preclude SARS-Cov-2 infection and should not be used as the sole basis for treatment or other patient management decisions. A negative result may occur with  improper specimen collection/handling, submission of specimen other than nasopharyngeal swab, presence of viral mutation(s) within the areas targeted by this assay, and inadequate number of viral copies(<138 copies/mL). A negative result must be combined with clinical observations, patient history, and  epidemiological information. The expected result is Negative.  Fact Sheet for Patients:  BloggerCourse.comhttps://www.fda.gov/media/152166/download  Fact Sheet for Healthcare Providers:  SeriousBroker.ithttps://www.fda.gov/media/152162/download  This test is no t yet approved or cleared by the [place] FDA and  has been authorized for detection and/or diagnosis of SARS-CoV-2 by FDA under an Emergency Use Authorization (EUA). This EUA will remain  in effect (meaning this test can be used) for the duration of the COVID-19 declaration under Section 564(b)(1) of the Act, 21 U.S.C.section 360bbb-3(b)(1), unless the authorization is terminated  or revoked sooner.       Influenza A by PCR NEGATIVE NEGATIVE Final   Influenza B by PCR NEGATIVE NEGATIVE Final    Comment: (NOTE) The Xpert Xpress SARS-CoV-2/FLU/RSV plus assay is intended as an aid in the diagnosis of influenza from Nasopharyngeal swab specimens and should not be used as a sole basis for treatment. Nasal washings and aspirates are unacceptable for Xpert Xpress SARS-CoV-2/FLU/RSV testing.  Fact Sheet for Patients: BloggerCourse.comhttps://www.fda.gov/media/152166/download  Fact Sheet for Healthcare Providers: SeriousBroker.ithttps://www.fda.gov/media/152162/download  This test is not yet approved or cleared by the [place] FDA and has been authorized for detection and/or diagnosis of SARS-CoV-2 by FDA under an Emergency Use Authorization (EUA). This EUA will remain in effect (meaning this test can be used) for the duration of the COVID-19 declaration under Section 564(b)(1) of the Act, 21 U.S.C. section 360bbb-3(b)(1), unless the authorization is terminated or revoked.  Performed at [facility], [address] [place], [place] [address]           Radiology Studies: DG Ribs Unilateral W/Chest Right  Result Date: 11/10/2020 CLINICAL DATA:  Fall, generalized weakness EXAM: RIGHT RIBS AND CHEST - 3+ VIEW COMPARISON:  Radiograph 05/15/2011 FINDINGS: Unchanged,  chronically elevated right hemidiaphragm with right basilar atelectasis. No new focal airspace consolidation. No visible pneumothorax unchanged cardiomediastinal silhouette with aortic calcification. There is no evidence of displaced rib fracture. IMPRESSION: No evidence of displaced rib fracture. Unchanged, chronically elevated right hemidiaphragm with right basilar atelectasis. Electronically Signed   By: Caprice RenshawJacob  Kahn   On: 11/10/2020 15:43   CT ABDOMEN PELVIS W CONTRAST  Result Date: 11/10/2020 CLINICAL DATA:  Multiple falls today. Increased weakness. Chills today and yesterday. EXAM: CT ABDOMEN AND PELVIS WITH CONTRAST TECHNIQUE: Multidetector CT imaging of the abdomen and pelvis was performed using the standard protocol following bolus administration of  intravenous contrast. CONTRAST:  OMNIPAQUE IOHEXOL 300 MG/ML  SOLN COMPARISON:  None. FINDINGS: Lower chest: Atelectasis or consolidation in the right lung base. No effusion. Elevation of the right hemidiaphragm. Hepatobiliary: Subcentimeter low-attenuation lesions in the lateral segment left lobe of liver, likely cysts. The gallbladder is distended with mild gallbladder wall thickening and edema. Mild extrahepatic bile duct dilatation. Suggestion of small stones in the gallbladder. No definitive common duct stones. Appearance suggest cholecystitis. Pancreas: Pancreas is mildly enlarged with mild pancreatic ductal dilatation. Inflammation in the peripancreatic fat. Prominence of the head of the pancreas. Changes likely represent early acute pancreatitis. No collections are identified. Follow-up after resolution of acute process is recommended to exclude underlying pancreatic head mass. Spleen: Normal in size without focal abnormality. Adrenals/Urinary Tract: Adrenal glands are unremarkable. Kidneys are normal, without renal calculi, focal lesion, or hydronephrosis. Bladder is diffusely distended with irregular and trabeculated bladder wall and multiple  small bladder diverticula. Changes suggest chronic outlet obstruction. No wall thickening. Stomach/Bowel: Stomach, small bowel, and colon are not abnormally distended although there is scattered fluid in the small bowel. No wall thickening or inflammatory changes are appreciated. Scattered stool in the colon. Scattered colonic diverticula without evidence of diverticulitis. Appendix is not identified. Vascular/Lymphatic: Aortic atherosclerosis. No enlarged abdominal or pelvic lymph nodes. Reproductive: Prostate gland is enlarged, measuring 5.8 cm diameter. Other: No free air or free fluid in the abdomen. Abdominal wall musculature appears intact. Musculoskeletal: Degenerative changes in the spine and hips. Lumbar scoliosis convex towards the left. IMPRESSION: 1. Distended gallbladder with mild edema and small stones. Bile duct dilatation. Changes may represent cholecystitis or biliary obstruction. 2. Inflammatory changes involving the pancreas with peripancreatic edema likely representing acute pancreatitis. There is fullness in the head of the pancreas and follow-up after resolution of acute process is recommended to exclude underlying pancreatic mass. 3. Enlarged prostate gland with distended bladder. 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 4. Atelectasis or consolidation in the right lung base. Electronically Signed   By: Burman Nieves M.D.   On: 11/10/2020 19:49   MR ABDOMEN MRCP W WO CONTAST  Result Date: 11/11/2020 CLINICAL DATA:  Acute severe pancreatitis assess for etiology. EXAM: MRI ABDOMEN WITHOUT AND WITH CONTRAST (INCLUDING MRCP) TECHNIQUE: Multiplanar multisequence MR imaging of the abdomen was performed both before and after the administration of intravenous contrast. Heavily T2-weighted images of the biliary and pancreatic ducts were obtained, and three-dimensional MRCP images were rendered by post processing. CONTRAST:  8.78mL  GADAVIST GADOBUTROL 1 MMOL/ML IV SOLN COMPARISON:  Same day CT. FINDINGS: Despite efforts by the technologist and patient, excessive motion artifact is present on today's exam and could not be eliminated. This reduces exam sensitivity and specificity. Lower chest: Right lung base atelectasis. Elevation the right hemidiaphragm. Hepatobiliary: Hepatic cysts measuring up to 1.4 cm. No suspicious enhancing hepatic lesion identified. Gallbladder is distended with wall thickening, pericholecystic fluid and mucosal hyperenhancement. No gallbladder stone visualized within the lumen of gallbladder. There is intra and extrahepatic biliary ductal dilation with the common duct measuring up to 15 cm. Common duct appears dilated throughout its course with fairly abrupt tapering of the CBD at the level of the ampulla. No convincing filling defects visualized within the biliary tree to suggest choledocholithiasis. Pancreas: Although degraded by motion artifact there appears to be subtle loss of intrinsic T1 signal intensity in the pancreatic parenchyma with T2 hyperintensity involving the tail of the pancreas. Small volume peripancreatic fluid without drainable walled  off collection. No definite areas of pancreatic hypoenhancement although evaluation is significantly limited by motion artifact. The main pancreatic duct appears prominent but within normal limits by measurements, evaluation of which is limited by motion. There the ratio of duct to parenchyma appears to be within normal limits for patient's age. There is no discrete mass in head of the pancreas visualized however evaluation for this is significantly limited by patient motion Spleen:  Within normal limits Adrenals/Urinary Tract: Bilateral adrenal glands are unremarkable. No solid enhancing renal masses. Trabecular appearance of the visualized portions urinary bladder wall with multiple small diverticula, suggestive of chronic outflow impedance. Stomach/Bowel:  Visualized portions within the abdomen are unremarkable. Vascular/Lymphatic: No pathologically enlarged lymph nodes identified. No abdominal aortic aneurysm demonstrated. Other: No pneumoperitoneum. No drainable walled off fluid collections. Musculoskeletal: No suspicious bone lesions identified. IMPRESSION: Examination is limited by excessive motion artifact on all sequences significantly limiting the sensitivity and specificity of the examination, with the postcontrast sequences being essentially nondiagnostic. Within this context: 1. Distended gallbladder with gallbladder wall thickening, pericholecystic fluid and mural hyperenhancement, findings most compatible with acute cholecystitis. 2. Intra and extrahepatic biliary ductal dilation with fairly abrupt tapering of the CBD at the level of the ampulla. Also there is mild prominence of the main pancreatic duct but with a pancreatic parenchyma to duct ratio which appears within normal limits for patient's age. No convincing evidence of choledocholithiasis or other obstructing etiology confidently identified. Given the limitations on today's examination and the above findings, further evaluation with EUS/ERCP may be warranted to assess for discrete obstructive etiology. Additionally, consider follow-up pancreatic protocol MRI with and without contrast (preferably as an outpatient) for further evaluation, when patient is stable and better able to follow commands including breath hold. 3. Findings of acute pancreatitis with small volume peripancreatic fluid. No drainable walled off fluid collection. 4. 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 Electronically Signed   By: Maudry Mayhew MD   On: 11/11/2020 08:46   DG Hip Unilat W or Wo Pelvis 2-3 Views Right  Result Date: 11/10/2020 CLINICAL DATA:  Fall and pain. EXAM: DG HIP (WITH OR WITHOUT PELVIS) 2-3V RIGHT  COMPARISON:  None. FINDINGS: AP view of the pelvis and AP/frog leg views of the right hip. Femoral heads are located. Marked osteoarthritis involving the weight-bearing surface of the right hip. No acute fracture. Mild osteopenia. IMPRESSION: Marked right hip osteoarthritis, without acute superimposed process. Given the severity of osteopenia, if there is a strong clinical concern of fracture, consider further evaluation with MRI. Electronically Signed   By: Jeronimo Greaves M.D.   On: 11/10/2020 17:13    Scheduled Meds:  carvedilol  25 mg Oral BID WC   pantoprazole  40 mg Oral Daily   sertraline  25 mg Oral TID   Continuous Infusions:  sodium chloride 50 mL/hr at 11/11/20 2303   piperacillin-tazobactam (ZOSYN)  IV 3.375 g (11/12/20 0233)     LOS: 2 days   Time spent:  Azucena Fallen, DO [facility]  If 7PM-7AM, please contact night-coverage [email or web address]  11/12/2020, 8:23 AM

## 2020-11-12 NOTE — Anesthesia Preprocedure Evaluation (Deleted)
Anesthesia Evaluation    Reviewed: Allergy & Precautions, Patient's Chart, lab work & pertinent test results, reviewed documented beta blocker date and time   Airway        Dental   Pulmonary neg pulmonary ROS,           Cardiovascular hypertension, Pt. on home beta blockers and Pt. on medications +CHF    TTE 2019 - Left ventricle: The cavity size was normal. Wall thickness was  normal. Systolic function was normal. The estimated ejection  fraction was in the range of 55% to 60%. Features are consistent  with a pseudonormal left ventricular filling pattern, with  concomitant abnormal relaxation and increased filling pressure  (grade 2 diastolic dysfunction).  - Aortic valve: There was mild regurgitation.  - Mitral valve: There was mild regurgitation.   Stress Test 2019 The left ventricular ejection fraction is moderately decreased (30-44%). Nuclear stress EF: 42%. There was no ST segment deviation noted during stress. This is an intermediate risk study.   1. EF 42%, diffuse hypokinesis. 2. Fixed small, mild mid to apical inferior perfusion defect.  There was not a regional inferior wall motion abnormality, so suspect more likely diaphragmatic attenuation than infarction.  No evidence for ischemia.    Neuro/Psych PSYCHIATRIC DISORDERS Anxiety negative neurological ROS     GI/Hepatic Neg liver ROS, PUD, GERD  Medicated and Controlled,  Endo/Other  negative endocrine ROS  Renal/GU negative Renal ROS  negative genitourinary   Musculoskeletal  (+) Arthritis ,   Abdominal   Peds  Hematology negative hematology ROS (+)   Anesthesia Other Findings Dilated bile duct, gallstone pancreatitis  Reproductive/Obstetrics                             Anesthesia Physical Anesthesia Plan  ASA:   Anesthesia Plan:    Post-op Pain Management:    Induction:   PONV Risk Score and Plan:    Airway Management Planned:   Additional Equipment:   Intra-op Plan:   Post-operative Plan:   Informed Consent:   Plan Discussed with:   Anesthesia Plan Comments: (Case cancelled by proceduralist )        Anesthesia Quick Evaluation

## 2020-11-12 NOTE — Plan of Care (Signed)

## 2020-11-12 NOTE — Progress Notes (Signed)
Progress Note     Subjective: CC: feeling "some better" today in regard to abdominal pain. No nausea emesis. Due to recent falls he is anxious about getting oob and has not ambulated  Wife at bedside  Objective: Vital signs in last 24 hours: Temp:  [98.3 F (36.8 C)-98.8 F (37.1 C)] 98.3 F (36.8 C) (06/16 0612) Pulse Rate:  [69-86] 75 (06/16 0612) Resp:  [16-25] 18 (06/16 0612) BP: (132-180)/(83-104) 155/86 (06/16 0612) SpO2:  [92 %-98 %] 94 % (06/16 0612) Weight:  [92 kg] 92 kg (06/16 0500) Last BM Date: 11/09/20  Intake/Output from previous day: 06/15 0701 - 06/16 0700 In: 700.3 [P.O.:100; I.V.:500; IV Piggyback:100.3] Out: 900 [Urine:900] Intake/Output this shift: Total I/O In: 0  Out: 600 [Urine:600]  PE: General: pleasant, WD, male who is laying in bed in NAD HEENT: head is normocephalic, atraumatic. Mouth is pink and moist Heart: regular, rate, and rhythm.  Palpable radial and pedal pulses bilaterally Lungs: Respiratory effort nonlabored Abd: soft, NT, +BS, mild tenderness to palpation of epigastric region MS: all 4 extremities are symmetrical with no cyanosis, clubbing, or edema. Skin: warm and dry with no masses, lesions, or rashes Psych: A&Ox3 with an appropriate affect.    Lab Results:  Recent Labs    11/11/20 0830 11/12/20 0055  WBC 16.3* 15.9*  HGB 12.4* 12.4*  HCT 37.2* 37.5*  PLT 145* 169   BMET Recent Labs    11/11/20 0841 11/12/20 0055  NA 129* 128*  K 4.2 3.6  CL 99 94*  CO2 16* 24  GLUCOSE 75 78  BUN 11 9  CREATININE 0.75 0.85  CALCIUM 8.7* 8.8*   PT/INR No results for input(s): LABPROT, INR in the last 72 hours. CMP     Component Value Date/Time   NA 128 (L) 11/12/2020 0055   NA 129 (L) 08/14/2017 1022   K 3.6 11/12/2020 0055   CL 94 (L) 11/12/2020 0055   CO2 24 11/12/2020 0055   GLUCOSE 78 11/12/2020 0055   BUN 9 11/12/2020 0055   BUN 14 08/14/2017 1022   CREATININE 0.85 11/12/2020 0055   CALCIUM 8.8 (L)  11/12/2020 0055   PROT 5.8 (L) 11/12/2020 0055   ALBUMIN 3.2 (L) 11/12/2020 0055   AST 63 (H) 11/12/2020 0055   ALT 45 (H) 11/12/2020 0055   ALKPHOS 46 11/12/2020 0055   BILITOT 1.0 11/12/2020 0055   GFRNONAA >60 11/12/2020 0055   GFRAA 92 08/14/2017 1022   Lipase     Component Value Date/Time   LIPASE 181 (H) 11/12/2020 0055       Studies/Results: DG Ribs Unilateral W/Chest Right  Result Date: 11/10/2020 CLINICAL DATA:  Fall, generalized weakness EXAM: RIGHT RIBS AND CHEST - 3+ VIEW COMPARISON:  Radiograph 05/15/2011 FINDINGS: Unchanged, chronically elevated right hemidiaphragm with right basilar atelectasis. No new focal airspace consolidation. No visible pneumothorax unchanged cardiomediastinal silhouette with aortic calcification. There is no evidence of displaced rib fracture. IMPRESSION: No evidence of displaced rib fracture. Unchanged, chronically elevated right hemidiaphragm with right basilar atelectasis. Electronically Signed   By: Caprice Renshaw   On: 11/10/2020 15:43   CT ABDOMEN PELVIS W CONTRAST  Result Date: 11/10/2020 CLINICAL DATA:  Multiple falls today. Increased weakness. Chills today and yesterday. EXAM: CT ABDOMEN AND PELVIS WITH CONTRAST TECHNIQUE: Multidetector CT imaging of the abdomen and pelvis was performed using the standard protocol following bolus administration of intravenous contrast. CONTRAST:  OMNIPAQUE IOHEXOL 300 MG/ML  SOLN COMPARISON:  None. FINDINGS: Lower  chest: Atelectasis or consolidation in the right lung base. No effusion. Elevation of the right hemidiaphragm. Hepatobiliary: Subcentimeter low-attenuation lesions in the lateral segment left lobe of liver, likely cysts. The gallbladder is distended with mild gallbladder wall thickening and edema. Mild extrahepatic bile duct dilatation. Suggestion of small stones in the gallbladder. No definitive common duct stones. Appearance suggest cholecystitis. Pancreas: Pancreas is mildly enlarged with mild  pancreatic ductal dilatation. Inflammation in the peripancreatic fat. Prominence of the head of the pancreas. Changes likely represent early acute pancreatitis. No collections are identified. Follow-up after resolution of acute process is recommended to exclude underlying pancreatic head mass. Spleen: Normal in size without focal abnormality. Adrenals/Urinary Tract: Adrenal glands are unremarkable. Kidneys are normal, without renal calculi, focal lesion, or hydronephrosis. Bladder is diffusely distended with irregular and trabeculated bladder wall and multiple small bladder diverticula. Changes suggest chronic outlet obstruction. No wall thickening. Stomach/Bowel: Stomach, small bowel, and colon are not abnormally distended although there is scattered fluid in the small bowel. No wall thickening or inflammatory changes are appreciated. Scattered stool in the colon. Scattered colonic diverticula without evidence of diverticulitis. Appendix is not identified. Vascular/Lymphatic: Aortic atherosclerosis. No enlarged abdominal or pelvic lymph nodes. Reproductive: Prostate gland is enlarged, measuring 5.8 cm diameter. Other: No free air or free fluid in the abdomen. Abdominal wall musculature appears intact. Musculoskeletal: Degenerative changes in the spine and hips. Lumbar scoliosis convex towards the left. IMPRESSION: 1. Distended gallbladder with mild edema and small stones. Bile duct dilatation. Changes may represent cholecystitis or biliary obstruction. 2. Inflammatory changes involving the pancreas with peripancreatic edema likely representing acute pancreatitis. There is fullness in the head of the pancreas and follow-up after resolution of acute process is recommended to exclude underlying pancreatic mass. 3. Enlarged prostate gland with distended bladder. 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 4. Atelectasis or consolidation in the right lung base.  Electronically Signed   By: Burman NievesWilliam  Stevens M.D.   On: 11/10/2020 19:49   MR ABDOMEN MRCP W WO CONTAST  Result Date: 11/11/2020 CLINICAL DATA:  Acute severe pancreatitis assess for etiology. EXAM: MRI ABDOMEN WITHOUT AND WITH CONTRAST (INCLUDING MRCP) TECHNIQUE: Multiplanar multisequence MR imaging of the abdomen was performed both before and after the administration of intravenous contrast. Heavily T2-weighted images of the biliary and pancreatic ducts were obtained, and three-dimensional MRCP images were rendered by post processing. CONTRAST:  8.38mL GADAVIST GADOBUTROL 1 MMOL/ML IV SOLN COMPARISON:  Same day CT. FINDINGS: Despite efforts by the technologist and patient, excessive motion artifact is present on today's exam and could not be eliminated. This reduces exam sensitivity and specificity. Lower chest: Right lung base atelectasis. Elevation the right hemidiaphragm. Hepatobiliary: Hepatic cysts measuring up to 1.4 cm. No suspicious enhancing hepatic lesion identified. Gallbladder is distended with wall thickening, pericholecystic fluid and mucosal hyperenhancement. No gallbladder stone visualized within the lumen of gallbladder. There is intra and extrahepatic biliary ductal dilation with the common duct measuring up to 15 cm. Common duct appears dilated throughout its course with fairly abrupt tapering of the CBD at the level of the ampulla. No convincing filling defects visualized within the biliary tree to suggest choledocholithiasis. Pancreas: Although degraded by motion artifact there appears to be subtle loss of intrinsic T1 signal intensity in the pancreatic parenchyma with T2 hyperintensity involving the tail of the pancreas. Small volume peripancreatic fluid without drainable walled off collection. No definite areas of pancreatic hypoenhancement although evaluation is significantly limited by motion artifact.  The main pancreatic duct appears prominent but within normal limits by measurements,  evaluation of which is limited by motion. There the ratio of duct to parenchyma appears to be within normal limits for patient's age. There is no discrete mass in head of the pancreas visualized however evaluation for this is significantly limited by patient motion Spleen:  Within normal limits Adrenals/Urinary Tract: Bilateral adrenal glands are unremarkable. No solid enhancing renal masses. Trabecular appearance of the visualized portions urinary bladder wall with multiple small diverticula, suggestive of chronic outflow impedance. Stomach/Bowel: Visualized portions within the abdomen are unremarkable. Vascular/Lymphatic: No pathologically enlarged lymph nodes identified. No abdominal aortic aneurysm demonstrated. Other: No pneumoperitoneum. No drainable walled off fluid collections. Musculoskeletal: No suspicious bone lesions identified. IMPRESSION: Examination is limited by excessive motion artifact on all sequences significantly limiting the sensitivity and specificity of the examination, with the postcontrast sequences being essentially nondiagnostic. Within this context: 1. Distended gallbladder with gallbladder wall thickening, pericholecystic fluid and mural hyperenhancement, findings most compatible with acute cholecystitis. 2. Intra and extrahepatic biliary ductal dilation with fairly abrupt tapering of the CBD at the level of the ampulla. Also there is mild prominence of the main pancreatic duct but with a pancreatic parenchyma to duct ratio which appears within normal limits for patient's age. No convincing evidence of choledocholithiasis or other obstructing etiology confidently identified. Given the limitations on today's examination and the above findings, further evaluation with EUS/ERCP may be warranted to assess for discrete obstructive etiology. Additionally, consider follow-up pancreatic protocol MRI with and without contrast (preferably as an outpatient) for further evaluation, when patient is  stable and better able to follow commands including breath hold. 3. Findings of acute pancreatitis with small volume peripancreatic fluid. No drainable walled off fluid collection. 4. 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 Electronically Signed   By: Maudry Mayhew MD   On: 11/11/2020 08:46   DG Hip Unilat W or Wo Pelvis 2-3 Views Right  Result Date: 11/10/2020 CLINICAL DATA:  Fall and pain. EXAM: DG HIP (WITH OR WITHOUT PELVIS) 2-3V RIGHT COMPARISON:  None. FINDINGS: AP view of the pelvis and AP/frog leg views of the right hip. Femoral heads are located. Marked osteoarthritis involving the weight-bearing surface of the right hip. No acute fracture. Mild osteopenia. IMPRESSION: Marked right hip osteoarthritis, without acute superimposed process. Given the severity of osteopenia, if there is a strong clinical concern of fracture, consider further evaluation with MRI. Electronically Signed   By: Jeronimo Greaves M.D.   On: 11/10/2020 17:13    Anti-infectives: Anti-infectives (From admission, onward)    Start     Dose/Rate Route Frequency Ordered Stop   11/11/20 0200  piperacillin-tazobactam (ZOSYN) IVPB 3.375 g        3.375 g 12.5 mL/hr over 240 Minutes Intravenous Every 8 hours 11/11/20 0104     11/10/20 2100  ceFEPIme (MAXIPIME) 2 g in sodium chloride 0.9 % 100 mL IVPB        2 g 200 mL/hr over 30 Minutes Intravenous  Once 11/10/20 2053 11/11/20 0042   11/10/20 2015  ceFEPIme (MAXIPIME) 1 g in sodium chloride 0.9 % 100 mL IVPB  Status:  Discontinued       See Hyperspace for full Linked Orders Report.   1 g 200 mL/hr over 30 Minutes Intravenous  Once 11/10/20 2009 11/10/20 2350   11/10/20 2015  metroNIDAZOLE (FLAGYL) IVPB 500 mg  See Hyperspace for full Linked Orders Report.   500 mg 100 mL/hr over 60 Minutes Intravenous  Once 11/10/20 2009 11/10/20 2321         Assessment/Plan Epigastric abdominal pain, nausea Elevated LFT's Acute pancreatitis, suspect biliary in origin, has a remote history of EtOH abuse Suspect he also has acute vs chronic calculous cholecystitis - afebrile, VSS, WBC 15.9 from 16.3 - AST/ALT remain elevated but lipase improved 181 (379) - follow tomorrow - MRCP with signs of cholecystitis, also showed Intra and extrahepatic biliary ductal dilation with fairly abrupt tapering of the CBD at the level of the ampulla. No definite choledocholithiasis or obvious obstruction   - GI does not recommend EUS/ERCP at this time - suspect patient has biliary pancreatitis - still tender on exam today and lipase elevated. Continue bowel rest and recheck tomorrow for possible lap chole with IOC tomorrow. We discussed the procedure and patient and wife are agreeable to proceed - PT/OT     FEN: NPO, sips with meds ID: Zosyn 6/15 >> VTE: SCD's, okay for chemical prophylaxis from our standpoint    HTN PMH PUD on omeprazole     LOS: 2 days    Eric Form, [facility] 11/12/2020, 11:34 AM Please see Amion for pager number during day hours 7:00am-4:30pm

## 2020-11-13 ENCOUNTER — Encounter (HOSPITAL_COMMUNITY)
Admission: EM | Disposition: A | Discharge status: Home Health | Payer: Self-pay | Source: Home / Self Care | Attending: Internal Medicine

## 2020-11-13 ENCOUNTER — Inpatient Hospital Stay (HOSPITAL_COMMUNITY): Payer: Medicare Other

## 2020-11-13 ENCOUNTER — Encounter (HOSPITAL_COMMUNITY): Payer: Self-pay | Admitting: Internal Medicine

## 2020-11-13 ENCOUNTER — Inpatient Hospital Stay (HOSPITAL_COMMUNITY): Payer: Medicare Other | Admitting: Registered Nurse

## 2020-11-13 HISTORY — PX: CHOLECYSTECTOMY: SHX55

## 2020-11-13 LAB — CBC
HCT: 37.5 % — ABNORMAL LOW (ref 39.0–52.0)
Hemoglobin: 12.3 g/dL — ABNORMAL LOW (ref 13.0–17.0)
MCH: 31.2 pg (ref 26.0–34.0)
MCHC: 32.8 g/dL (ref 30.0–36.0)
MCV: 95.2 fL (ref 80.0–100.0)
Platelets: 200 10*3/uL (ref 150–400)
RBC: 3.94 MIL/uL — ABNORMAL LOW (ref 4.22–5.81)
RDW: 13.1 % (ref 11.5–15.5)
WBC: 11.3 10*3/uL — ABNORMAL HIGH (ref 4.0–10.5)
nRBC: 0 % (ref 0.0–0.2)

## 2020-11-13 LAB — COMPREHENSIVE METABOLIC PANEL
ALT: 42 U/L (ref 0–44)
AST: 56 U/L — ABNORMAL HIGH (ref 15–41)
Albumin: 3.1 g/dL — ABNORMAL LOW (ref 3.5–5.0)
Alkaline Phosphatase: 44 U/L (ref 38–126)
Anion gap: 11 (ref 5–15)
BUN: 12 mg/dL (ref 8–23)
CO2: 23 mmol/L (ref 22–32)
Calcium: 8.6 mg/dL — ABNORMAL LOW (ref 8.9–10.3)
Chloride: 96 mmol/L — ABNORMAL LOW (ref 98–111)
Creatinine, Ser: 1.01 mg/dL (ref 0.61–1.24)
GFR, Estimated: >60 mL/min (ref >60)
Glucose, Bld: 69 mg/dL — ABNORMAL LOW (ref 70–99)
Potassium: 3.5 mmol/L (ref 3.5–5.1)
Sodium: 130 mmol/L — ABNORMAL LOW (ref 135–145)
Total Bilirubin: 1.7 mg/dL — ABNORMAL HIGH (ref 0.3–1.2)
Total Protein: 5.9 g/dL — ABNORMAL LOW (ref 6.5–8.1)

## 2020-11-13 LAB — LIPASE, BLOOD: Lipase: 80 U/L — ABNORMAL HIGH (ref 11–51)

## 2020-11-13 SURGERY — LAPAROSCOPIC CHOLECYSTECTOMY WITH INTRAOPERATIVE CHOLANGIOGRAM
Anesthesia: General | Site: Abdomen

## 2020-11-13 MED ORDER — HYDROMORPHONE HCL 1 MG/ML IJ SOLN: 0.2500 mg | INTRAMUSCULAR | Status: DC | PRN

## 2020-11-13 MED ORDER — PROMETHAZINE HCL 25 MG/ML IJ SOLN: 6.2500 mg | INTRAMUSCULAR | Status: DC | PRN

## 2020-11-13 MED ORDER — PHENYLEPHRINE 40 MCG/ML (10ML) SYRINGE FOR IV PUSH (FOR BLOOD PRESSURE SUPPORT)
PREFILLED_SYRINGE | INTRAVENOUS | Status: DC | PRN
  Administered 2020-11-13: 80 ug via INTRAVENOUS

## 2020-11-13 MED ORDER — SODIUM CHLORIDE (PF) 0.9 % IJ SOLN
INTRAVENOUS | Status: DC | PRN
  Administered 2020-11-13: 30 mL

## 2020-11-13 MED ORDER — DEXAMETHASONE SODIUM PHOSPHATE 10 MG/ML IJ SOLN
INTRAMUSCULAR | Status: DC | PRN
  Administered 2020-11-13: 5 mg via INTRAVENOUS

## 2020-11-13 MED ORDER — DEXAMETHASONE SODIUM PHOSPHATE 10 MG/ML IJ SOLN
INTRAMUSCULAR | Status: AC
  Filled 2020-11-13: qty 1

## 2020-11-13 MED ORDER — 0.9 % SODIUM CHLORIDE (POUR BTL) OPTIME
TOPICAL | Status: DC | PRN
  Administered 2020-11-13: 1000 mL

## 2020-11-13 MED ORDER — CHLORHEXIDINE GLUCONATE 0.12 % MT SOLN
OROMUCOSAL | Status: AC
  Filled 2020-11-13: qty 15

## 2020-11-13 MED ORDER — LIDOCAINE HCL (PF) 2 % IJ SOLN
INTRAMUSCULAR | Status: AC
  Filled 2020-11-13: qty 5

## 2020-11-13 MED ORDER — ONDANSETRON HCL 4 MG/2ML IJ SOLN
INTRAMUSCULAR | Status: AC
  Filled 2020-11-13: qty 2

## 2020-11-13 MED ORDER — ENOXAPARIN SODIUM 40 MG/0.4ML IJ SOSY
40.0000 mg | PREFILLED_SYRINGE | INTRAMUSCULAR | Status: DC
  Administered 2020-11-13 – 2020-11-14 (×2): 40 mg via SUBCUTANEOUS
  Filled 2020-11-13 (×2): qty 0.4

## 2020-11-13 MED ORDER — LIDOCAINE HCL (PF) 2 % IJ SOLN
INTRAMUSCULAR | Status: DC | PRN
  Administered 2020-11-13: 80 mg via INTRADERMAL

## 2020-11-13 MED ORDER — LABETALOL HCL 5 MG/ML IV SOLN
INTRAVENOUS | Status: AC
  Filled 2020-11-13: qty 4

## 2020-11-13 MED ORDER — LACTATED RINGERS IV SOLN: INTRAVENOUS | Status: DC

## 2020-11-13 MED ORDER — ROCURONIUM BROMIDE 10 MG/ML (PF) SYRINGE
PREFILLED_SYRINGE | INTRAVENOUS | Status: AC
  Filled 2020-11-13: qty 20

## 2020-11-13 MED ORDER — EPHEDRINE SULFATE-NACL 50-0.9 MG/10ML-% IV SOSY
PREFILLED_SYRINGE | INTRAVENOUS | Status: DC | PRN
  Administered 2020-11-13 (×4): 10 mg via INTRAVENOUS

## 2020-11-13 MED ORDER — PROPOFOL 10 MG/ML IV BOLUS
INTRAVENOUS | Status: DC | PRN
  Administered 2020-11-13: 150 mg via INTRAVENOUS

## 2020-11-13 MED ORDER — EPHEDRINE 5 MG/ML INJ
INTRAVENOUS | Status: AC
  Filled 2020-11-13: qty 10

## 2020-11-13 MED ORDER — ALBUMIN HUMAN 5 % IV SOLN: INTRAVENOUS | Status: DC | PRN

## 2020-11-13 MED ORDER — SODIUM CHLORIDE 0.9 % IV SOLN: INTRAVENOUS | Status: DC

## 2020-11-13 MED ORDER — ROCURONIUM BROMIDE 10 MG/ML (PF) SYRINGE
PREFILLED_SYRINGE | INTRAVENOUS | Status: DC | PRN
  Administered 2020-11-13: 100 mg via INTRAVENOUS

## 2020-11-13 MED ORDER — FENTANYL CITRATE (PF) 250 MCG/5ML IJ SOLN
INTRAMUSCULAR | Status: DC | PRN
  Administered 2020-11-13: 100 ug via INTRAVENOUS
  Administered 2020-11-13: 25 ug via INTRAVENOUS

## 2020-11-13 MED ORDER — ONDANSETRON HCL 4 MG/2ML IJ SOLN
INTRAMUSCULAR | Status: DC | PRN
  Administered 2020-11-13: 4 mg via INTRAVENOUS

## 2020-11-13 MED ORDER — AMISULPRIDE (ANTIEMETIC) 5 MG/2ML IV SOLN: 10.0000 mg | Freq: Once | INTRAVENOUS | Status: DC | PRN

## 2020-11-13 MED ORDER — BUPIVACAINE-EPINEPHRINE (PF) 0.25% -1:200000 IJ SOLN
INTRAMUSCULAR | Status: AC
  Filled 2020-11-13: qty 30

## 2020-11-13 MED ORDER — CHLORHEXIDINE GLUCONATE 0.12 % MT SOLN
15.0000 mL | OROMUCOSAL | Status: AC
  Administered 2020-11-13: 15 mL via OROMUCOSAL
  Filled 2020-11-13: qty 15

## 2020-11-13 MED ORDER — BUPIVACAINE-EPINEPHRINE 0.25% -1:200000 IJ SOLN
INTRAMUSCULAR | Status: DC | PRN
  Administered 2020-11-13: 16 mL

## 2020-11-13 MED ORDER — SODIUM CHLORIDE 0.9 % IR SOLN
Status: DC | PRN
  Administered 2020-11-13 (×2): 1000 mL

## 2020-11-13 MED ORDER — FENTANYL CITRATE (PF) 250 MCG/5ML IJ SOLN
INTRAMUSCULAR | Status: AC
  Filled 2020-11-13: qty 5

## 2020-11-13 MED ORDER — PROPOFOL 10 MG/ML IV BOLUS
INTRAVENOUS | Status: AC
  Filled 2020-11-13: qty 20

## 2020-11-13 MED ORDER — SUGAMMADEX SODIUM 200 MG/2ML IV SOLN
INTRAVENOUS | Status: DC | PRN
  Administered 2020-11-13 (×2): 100 mg via INTRAVENOUS

## 2020-11-13 SURGICAL SUPPLY — 49 items
ADH SKN CLS APL DERMABOND .7 (GAUZE/BANDAGES/DRESSINGS) ×1
APL PRP STRL LF DISP 70% ISPRP (MISCELLANEOUS) ×1
APPLIER CLIP 5 13 M/L LIGAMAX5 (MISCELLANEOUS) ×4
APR CLP MED LRG 5 ANG JAW (MISCELLANEOUS) ×2
BAG SPEC RTRVL 10 TROC 200 (ENDOMECHANICALS) ×1
BLADE CLIPPER SURG (BLADE) ×2 IMPLANT
CANISTER SUCT 3000ML PPV (MISCELLANEOUS) ×2 IMPLANT
CATH REDDICK CHOLANGI 4FR 50CM (CATHETERS) ×2 IMPLANT
CHLORAPREP W/TINT 26 (MISCELLANEOUS) ×2 IMPLANT
CLIP APPLIE 5 13 M/L LIGAMAX5 (MISCELLANEOUS) ×2 IMPLANT
COVER MAYO STAND STRL (DRAPES) ×2 IMPLANT
COVER SURGICAL LIGHT HANDLE (MISCELLANEOUS) ×2 IMPLANT
DERMABOND ADVANCED (GAUZE/BANDAGES/DRESSINGS) ×1
DERMABOND ADVANCED .7 DNX12 (GAUZE/BANDAGES/DRESSINGS) ×1 IMPLANT
DRAPE C-ARM 42X120 X-RAY (DRAPES) ×2 IMPLANT
ELECT REM PT RETURN 9FT ADLT (ELECTROSURGICAL) ×2
ELECTRODE REM PT RTRN 9FT ADLT (ELECTROSURGICAL) ×1 IMPLANT
ENDOLOOP SUT PDS II  0 18 (SUTURE) ×2
ENDOLOOP SUT PDS II 0 18 (SUTURE) ×1 IMPLANT
GLOVE BIO SURGEON STRL SZ8 (GLOVE) ×2 IMPLANT
GLOVE SRG 8 PF TXTR STRL LF DI (GLOVE) ×1 IMPLANT
GLOVE SURG UNDER POLY LF SZ8 (GLOVE) ×2
GOWN STRL REUS W/ TWL LRG LVL3 (GOWN DISPOSABLE) ×2 IMPLANT
GOWN STRL REUS W/ TWL XL LVL3 (GOWN DISPOSABLE) ×1 IMPLANT
GOWN STRL REUS W/TWL LRG LVL3 (GOWN DISPOSABLE) ×4
GOWN STRL REUS W/TWL XL LVL3 (GOWN DISPOSABLE) ×2
KIT BASIN OR (CUSTOM PROCEDURE TRAY) ×2 IMPLANT
KIT TURNOVER KIT B (KITS) ×2 IMPLANT
L-HOOK LAP DISP 36CM (ELECTROSURGICAL) ×2
LHOOK LAP DISP 36CM (ELECTROSURGICAL) ×1 IMPLANT
NEEDLE 22X1 1/2 (OR ONLY) (NEEDLE) ×2 IMPLANT
NS IRRIG 1000ML POUR BTL (IV SOLUTION) ×2 IMPLANT
PAD ARMBOARD 7.5X6 YLW CONV (MISCELLANEOUS) ×2 IMPLANT
PENCIL BUTTON HOLSTER BLD 10FT (ELECTRODE) ×2 IMPLANT
POUCH RETRIEVAL ECOSAC 10 (ENDOMECHANICALS) ×1 IMPLANT
POUCH RETRIEVAL ECOSAC 10MM (ENDOMECHANICALS) ×2
SCISSORS LAP 5X35 DISP (ENDOMECHANICALS) ×2 IMPLANT
SET CHOLANGIOGRAPH 5 50 .035 (SET/KITS/TRAYS/PACK) ×2 IMPLANT
SET IRRIG TUBING LAPAROSCOPIC (IRRIGATION / IRRIGATOR) ×2 IMPLANT
SET TUBE SMOKE EVAC HIGH FLOW (TUBING) ×2 IMPLANT
SLEEVE ENDOPATH XCEL 5M (ENDOMECHANICALS) ×4 IMPLANT
SPECIMEN JAR SMALL (MISCELLANEOUS) ×2 IMPLANT
SUT VIC AB 4-0 PS2 27 (SUTURE) ×2 IMPLANT
TOWEL GREEN STERILE (TOWEL DISPOSABLE) ×2 IMPLANT
TOWEL GREEN STERILE FF (TOWEL DISPOSABLE) ×2 IMPLANT
TRAY LAPAROSCOPIC MC (CUSTOM PROCEDURE TRAY) ×2 IMPLANT
TROCAR XCEL BLUNT TIP 100MML (ENDOMECHANICALS) ×2 IMPLANT
TROCAR XCEL NON-BLD 5MMX100MML (ENDOMECHANICALS) ×2 IMPLANT
WATER STERILE IRR 1000ML POUR (IV SOLUTION) ×2 IMPLANT

## 2020-11-13 NOTE — Anesthesia Postprocedure Evaluation (Signed)
Anesthesia Post Note  Patient: Brian Ruiz  Procedure(s) Performed: LAPAROSCOPIC CHOLECYSTECTOMY WITH INTRAOPERATIVE CHOLANGIOGRAM (Abdomen)     Patient location during evaluation: PACU Anesthesia Type: General Level of consciousness: awake and alert Pain management: pain level controlled Vital Signs Assessment: post-procedure vital signs reviewed and stable Respiratory status: spontaneous breathing, nonlabored ventilation and respiratory function stable Cardiovascular status: blood pressure returned to baseline and stable Postop Assessment: no apparent nausea or vomiting Anesthetic complications: no   No notable events documented.  Last Vitals:  Vitals:   11/13/20 1209 11/13/20 1221  BP: 121/72 140/77  Pulse: 73 74  Resp: 15 19  Temp: 36.6 C 36.8 C  SpO2: 92% 94%    Last Pain:  Vitals:   11/13/20 1221  TempSrc: Oral  PainSc:                  Lowella Curb

## 2020-11-13 NOTE — Care Management Important Message (Signed)
+  Important Message  Patient Details  Name: DEVON PRETTY MRN: [id number] Date of Birth: 03/14/1946   Medicare Important Message Given:  Yes     Tod Abrahamsen Stefan Church 11/13/2020, 3:55 PM

## 2020-11-13 NOTE — Progress Notes (Signed)
PT Cancellation Note  Patient Details Name: Brian Ruiz MRN: [id number] DOB: 04-Dec-1945   Cancelled Treatment:    Reason Eval/Treat Not Completed: Medical issues which prohibited therapy;Patient at procedure or test/unavailable.  Pt has laparoscopic procedure today for cholecystitis, will see post op as requested.   Ivar Drape 11/13/2020, 9:27 AM  Samul Dada, PT MS Acute Rehab Dept. Number: [facility] [id number] and [facility] [phone]

## 2020-11-13 NOTE — Anesthesia Procedure Notes (Signed)
Procedure Name: Intubation Date/Time: 11/13/2020 10:03 AM Performed by: Trinna Post., CRNA Pre-anesthesia Checklist: Patient identified, Emergency Drugs available, Suction available, Patient being monitored and Timeout performed Patient Re-evaluated:Patient Re-evaluated prior to induction Oxygen Delivery Method: Circle system utilized Preoxygenation: Pre-oxygenation with 100% oxygen Induction Type: IV induction Ventilation: Mask ventilation without difficulty and Oral airway inserted - appropriate to patient size Laryngoscope Size: Mac and 4 Grade View: Grade I Tube type: Oral Tube size: 7.5 mm Number of attempts: 1 Airway Equipment and Method: Stylet Placement Confirmation: positive ETCO2, ETT inserted through vocal cords under direct vision and breath sounds checked- equal and bilateral Secured at: 23 cm Tube secured with: Tape Dental Injury: Teeth and Oropharynx as per pre-operative assessment

## 2020-11-13 NOTE — Plan of Care (Signed)

## 2020-11-13 NOTE — Progress Notes (Signed)
Patient ID: Brian Ruiz, male   DOB: August 19, 1945, 75 y.o.   MRN: [id number] Day of Surgery   Subjective: In pre-op Less abdominal pain ROS negative except as listed above. Objective: Vital signs in last 24 hours: Temp:  [97.7 F (36.5 C)-99 F (37.2 C)] 97.7 F (36.5 C) (06/17 0836) Pulse Rate:  [58-78] 78 (06/17 0836) Resp:  [17-20] 18 (06/17 0836) BP: (154-188)/(82-95) 188/95 (06/17 0836) SpO2:  [93 %-99 %] 96 % (06/17 0836) Weight:  [92.2 kg] 92.2 kg (06/17 0431) Last BM Date: 11/09/20  Intake/Output from previous day: 06/16 0701 - 06/17 0700 In: 739.6 [I.V.:589.6; IV Piggyback:150] Out: 1400 [Urine:1400] Intake/Output this shift: No intake/output data recorded.  General appearance: alert and cooperative Resp: clear to auscultation bilaterally Cardio: regular rate and rhythm GI: soft, NT  Lab Results: CBC  Recent Labs    11/12/20 0055 11/13/20 0048  WBC 15.9* 11.3*  HGB 12.4* 12.3*  HCT 37.5* 37.5*  PLT 169 200   BMET Recent Labs    11/12/20 0055 11/13/20 0048  NA 128* 130*  K 3.6 3.5  CL 94* 96*  CO2 24 23  GLUCOSE 78 69*  BUN 9 12  CREATININE 0.85 1.01  CALCIUM 8.8* 8.6*   PT/INR No results for input(s): LABPROT, INR in the last 72 hours. ABG No results for input(s): PHART, HCO3 in the last 72 hours.  Invalid input(s): PCO2, PO2  Studies/Results: No results found.  Anti-infectives: Anti-infectives (From admission, onward)    Start     Dose/Rate Route Frequency Ordered Stop   11/11/20 0200  [MAR Hold]  piperacillin-tazobactam (ZOSYN) IVPB 3.375 g        (MAR Hold since Fri 11/13/2020 at 0822.Hold Reason: Transfer to a Procedural area)   3.375 g 12.5 mL/hr over 240 Minutes Intravenous Every 8 hours 11/11/20 0104     11/10/20 2100  ceFEPIme (MAXIPIME) 2 g in sodium chloride 0.9 % 100 mL IVPB        2 g 200 mL/hr over 30 Minutes Intravenous  Once 11/10/20 2053 11/11/20 0042   11/10/20 2015  ceFEPIme (MAXIPIME) 1 g in sodium chloride 0.9 %  100 mL IVPB  Status:  Discontinued       See Hyperspace for full Linked Orders Report.   1 g 200 mL/hr over 30 Minutes Intravenous  Once 11/10/20 2009 11/10/20 2350   11/10/20 2015  metroNIDAZOLE (FLAGYL) IVPB 500 mg       See Hyperspace for full Linked Orders Report.   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 Procedure, risks, and benefits discussed and he agrees.  - he requests PT/OT eval post-op     FEN: NPO, sips with meds ID: Zosyn 6/15 >> VTE: SCD's, okay for chemical prophylaxis from our standpoint   LOS: 3 days    Violeta Gelinas, MD, MPH, FACS Trauma & General Surgery Use [email or web address] to contact on call provider  11/13/2020

## 2020-11-13 NOTE — Anesthesia Preprocedure Evaluation (Signed)
Anesthesia Evaluation    Reviewed: Allergy & Precautions, Patient's Chart, lab work & pertinent test results, reviewed documented beta blocker date and time   Airway Mallampati: II  TM Distance: >3 FB Neck ROM: Full    Dental no notable dental hx.    Pulmonary neg pulmonary ROS,    Pulmonary exam normal breath sounds clear to auscultation       Cardiovascular hypertension, Pt. on home beta blockers and Pt. on medications +CHF  Normal cardiovascular exam Rhythm:Regular Rate:Normal  TTE 2019 - Left ventricle: The cavity size was normal. Wall thickness was  normal. Systolic function was normal. The estimated ejection  fraction was in the range of 55% to 60%. Features are consistent  with a pseudonormal left ventricular filling pattern, with  concomitant abnormal relaxation and increased filling pressure  (grade 2 diastolic dysfunction).  - Aortic valve: There was mild regurgitation.  - Mitral valve: There was mild regurgitation.   Stress Test 2019 The left ventricular ejection fraction is moderately decreased (30-44%). Nuclear stress EF: 42%. There was no ST segment deviation noted during stress. This is an intermediate risk study.   1. EF 42%, diffuse hypokinesis. 2. Fixed small, mild mid to apical inferior perfusion defect.  There was not a regional inferior wall motion abnormality, so suspect more likely diaphragmatic attenuation than infarction.  No evidence for ischemia.    Neuro/Psych PSYCHIATRIC DISORDERS Anxiety negative neurological ROS     GI/Hepatic Neg liver ROS, PUD, GERD  Medicated and Controlled,  Endo/Other  negative endocrine ROS  Renal/GU negative Renal ROS  negative genitourinary   Musculoskeletal  (+) Arthritis ,   Abdominal   Peds  Hematology negative hematology ROS (+)   Anesthesia Other Findings Dilated bile duct, gallstone pancreatitis  Reproductive/Obstetrics                              Anesthesia Physical  Anesthesia Plan  ASA: 2  Anesthesia Plan: General   Post-op Pain Management:    Induction: Intravenous  PONV Risk Score and Plan: 2 and Ondansetron, Midazolam and Treatment may vary due to age or medical condition  Airway Management Planned: Oral ETT  Additional Equipment:   Intra-op Plan:   Post-operative Plan: Extubation in OR  Informed Consent: I have reviewed the patients History and Physical, chart, labs and discussed the procedure including the risks, benefits and alternatives for the proposed anesthesia with the patient or authorized representative who has indicated his/her understanding and acceptance.     Dental advisory given  Plan Discussed with: CRNA  Anesthesia Plan Comments:         Anesthesia Quick Evaluation

## 2020-11-13 NOTE — Transfer of Care (Signed)
Immediate Anesthesia Transfer of Care Note  Patient: Brian Ruiz  Procedure(s) Performed: LAPAROSCOPIC CHOLECYSTECTOMY WITH INTRAOPERATIVE CHOLANGIOGRAM (Abdomen)  Patient Location: PACU  Anesthesia Type:General  Level of Consciousness: awake and drowsy  Airway & Oxygen Therapy: Patient Spontanous Breathing  Post-op Assessment: Report given to RN and Post -op Vital signs reviewed and stable  Post vital signs: Reviewed and stable  Last Vitals:  Vitals Value Taken Time  BP 134/68 11/13/20 1155  Temp    Pulse 77 11/13/20 1157  Resp 18 11/13/20 1157  SpO2 93 % 11/13/20 1157  Vitals shown include unvalidated device data.  Last Pain:  Vitals:   11/13/20 0836  TempSrc: Oral  PainSc:          Complications: No notable events documented.

## 2020-11-13 NOTE — Evaluation (Signed)
Occupational Therapy Evaluation Patient Details Name: Brian Ruiz MRN: [id number] DOB: [birth year] Today's Date: 11/13/2020    History of Present Illness Pt is a 75 y.o. M admitted to [facility] after 3 falls, the last fall resulting in pt hitting his head. Upon evaluation in ED, pt was found tohave pancreatitis and choledocholithiasis. On 6/17 pt had s/p laparoscopic cholecystectomy. Significant past medical history of arthritis, chronic back pain, hypertension, upper GI bleed secondary to PUD 05/2019.  Past thyroidectomy and left inguinal hernia repair.   Clinical Impression   Pt admitted to ED for concerns and procedure listed above. PTA pt reported that he was overall independent with all ADL's, however at times pt's pain level and weakness would increase and his wife would need to assist minimally with mobility and ADL's. At the time of the evaluation, pt demonstrated decreased activity tolerance and increased weakness. Pt requiring overall min guard to min A for all functional mobility and ADL's for safety. At this time pt will benefit from [facility] therapies, however if his pain level increases and mobility decreases pt and wife have been educated on Inpatient therapies as well, due to pt's wife being unable to physically assist pt. Acute OT will continue to follow and assess as pt continues mobilizing.     Follow Up Recommendations  Home health OT    Equipment Recommendations  3 in 1 bedside commode;Tub/shower seat;Other (comment) (RW)    Recommendations for Other Services       Precautions / Restrictions Precautions Precautions: Fall Precaution Comments: Pt reports he has fainting spells at times, as well as chronic pain in his back and R hip joint Restrictions Weight Bearing Restrictions: No      Mobility Bed Mobility Overal bed mobility: Needs Assistance Bed Mobility: Supine to Sit;Sit to Supine     Supine to sit: Supervision;HOB elevated Sit to supine: Min guard;HOB elevated         Transfers Overall transfer level: Needs assistance Equipment used: Rolling walker (2 wheeled) Transfers: Sit to/from Stand Sit to Stand: Min guard;From elevated surface              Balance Overall balance assessment: Mild deficits observed, not formally tested;History of Falls                                         ADL either performed or assessed with clinical judgement   ADL Overall ADL's : Needs assistance/impaired Eating/Feeding: Set up;Sitting   Grooming: Set up;Sitting   Upper Body Bathing: Set up;Sitting   Lower Body Bathing: Minimal assistance;Sitting/lateral leans;Sit to/from stand   Upper Body Dressing : Set up;Sitting   Lower Body Dressing: Minimal assistance;Sitting/lateral leans;Sit to/from stand   Toilet Transfer: Min guard;Minimal assistance;Ambulation   Toileting- Clothing Manipulation and Hygiene: Min guard;Sitting/lateral lean;Sit to/from stand   Tub/ Shower Transfer: Min guard;Minimal assistance;Ambulation   Functional mobility during ADLs: Min guard;Minimal assistance;Rolling walker General ADL Comments: Pt overall at min guard level, at times presents with increased weakness requiring minimal support. Pt and wife feel that he is doing much better at this time due to pain meds and are worried that he won't do as well once he does not have these pain meds.     Vision Baseline Vision/History: Wears glasses Wears Glasses: Reading only Patient Visual Report: No change from baseline Vision Assessment?: No apparent visual deficits     Perception Perception Perception  Tested?: No   Praxis Praxis Praxis tested?: Not tested    Pertinent Vitals/Pain Pain Assessment: 0-10 Pain Score: 3  Pain Location: abdomen/surgical site Pain Descriptors / Indicators: Discomfort;Operative site guarding Pain Intervention(s): Limited activity within patient's tolerance;Monitored during session;Repositioned     Hand Dominance Right    Extremity/Trunk Assessment Upper Extremity Assessment Upper Extremity Assessment: Overall WFL for tasks assessed   Lower Extremity Assessment Lower Extremity Assessment: Defer to PT evaluation   Cervical / Trunk Assessment Cervical / Trunk Assessment: Normal   Communication Communication Communication: No difficulties   Cognition Arousal/Alertness: Awake/alert Behavior During Therapy: WFL for tasks assessed/performed Overall Cognitive Status: History of cognitive impairments - at baseline                                     General Comments  VSS on RA    Exercises     Shoulder Instructions      Home Living Family/patient expects to be discharged to:: Private residence Living Arrangements: Spouse/significant other Available Help at Discharge: Family;Available 24 hours/day Type of Home: House Home Access: Level entry     Home Layout: One level     Bathroom Shower/Tub: Producer, television/film/video: Handicapped height Bathroom Accessibility: Yes How Accessible: Accessible via walker Home Equipment: Cane - single point          Prior Functioning/Environment Level of Independence: Needs assistance  Gait / Transfers Assistance Needed: PT uses a cane to walk, at time wife has to assist with standing as much as she can. ADL's / Homemaking Assistance Needed: Pt typically is independent, however at times, he is in too much pain and wife has to assist him.            OT Problem List: Decreased strength;Decreased activity tolerance;Impaired balance (sitting and/or standing);Decreased safety awareness;Decreased knowledge of use of DME or AE;Decreased cognition;Impaired sensation;Pain      OT Treatment/Interventions: Self-care/ADL training;Therapeutic exercise;Energy conservation;DME and/or AE instruction;Therapeutic activities;Cognitive remediation/compensation;Patient/family education;Balance training    OT Goals(Current goals can be found in the  care plan section) Acute Rehab OT Goals Patient Stated Goal: To improve strength OT Goal Formulation: With patient/family Time For Goal Achievement: 11/27/20 Potential to Achieve Goals: Good ADL Goals Pt Will Perform Grooming: with supervision;standing Pt Will Transfer to Toilet: with supervision;ambulating Pt Will Perform Toileting - Clothing Manipulation and hygiene: with supervision;sitting/lateral leans;sit to/from stand Additional ADL Goal #1: Pt will tolerate OOB activities for 5 mins to improve activity tolerance.  OT Frequency: Min 2X/week   Barriers to D/C:    Wife reports that she can not provide much physical assist for pt, he would need to go home at a supervision to easy min guard level.       Co-evaluation              AM-PAC OT "6 Clicks" Daily Activity     Outcome Measure Help from another person eating meals?: A Little Help from another person taking care of personal grooming?: A Little Help from another person toileting, which includes using toliet, bedpan, or urinal?: A Little Help from another person bathing (including washing, rinsing, drying)?: A Little Help from another person to put on and taking off regular upper body clothing?: A Little Help from another person to put on and taking off regular lower body clothing?: A Little 6 Click Score: 18   End of Session Equipment Utilized During Treatment:  Rolling walker Nurse Communication: Mobility status  Activity Tolerance: Patient tolerated treatment well Patient left: in bed;with call bell/phone within reach;with family/visitor present  OT Visit Diagnosis: Unsteadiness on feet (R26.81);Other abnormalities of gait and mobility (R26.89);Muscle weakness (generalized) (M62.81)                Time: 2263-3354 OT Time Calculation (min): 60 min Charges:  OT General Charges $OT Visit: 1 Visit OT Evaluation $OT Eval Moderate Complexity: 1 Mod OT Treatments $Self Care/Home Management : 38-52 mins  Makinzey Banes H.,  OTR/L Acute Rehabilitation  Antavius Sperbeck Elane Bing Plume 11/13/2020, 6:46 PM

## 2020-11-13 NOTE — Progress Notes (Signed)
PROGRESS NOTE    Brian Ruiz  CHY:[id number] DOB: 11-Jan-1946 DOA: 11/10/2020 PCP: Tracey Harries, MD   Brief Narrative:  Brian Ruiz is a 75 y.o. male with history of hypertension PVCs and peptic ulcer disease who has had GI bleed previously seen by gastroenterologist and colonoscopy last EGD was in January 2021 has had a fall today and was brought to the ER.  Patient had 2 falls 1 at 4 AM but at the time EMS advised coming to ER but patient refused.  Subsequent which patient had another fall at around 11 AM and was brought to the ER.  The exact circumstances of the fall is not clear.  Patient did not lose consciousness.  In the ED CT abdomen pelvis was done which shows features concerning for acute cholecystitis with choledocholithiasis and pancreatitis.  ER physician started patient on fluids and empiric antibiotics and admitted for further management of acute pancreatitis likely biliary in origin.  Patient denies drinking alcohol.  Labs otherwise showed WBC of 19.8 hemoglobin 12.5.  Lactic acid was normal.  Assessment & Plan:   Principal Problem:   Acute pancreatitis Active Problems:   Hypertension   Acute pancreatitis Rule out acute cholecystitis -GI, surgery to follow along, appreciate insight and recommendations  -MRCP showed signs of cholecystitis with questionable intra and extrahepatic ductal dilation without overt signs of choledocholithiasis or obstruction -ERCP discontinued -Repeat labs stable - lipase down -Lap chole planned later today  Profound hypovolemic hyponatremia  -Resolving with IV fluids confirming likely hypovolemic in etiology; wife indicates poor p.o. intake at bedside previously, today she indicates the patient "drinks lots of water" -we will continue to follow, unclear now if this is poor p.o. intake and dehydration versus SIADH or inappropriate intake of excess water -Urine sodium reassuringly low  Essential hypertension  -Continue carvedilol  DVT  prophylaxis: SCDs avoiding anticoagulation in anticipation of possible procedure. Code Status: Full code. Family Communication: Wife at bedside  Status is: Inpatient  Dispo: The patient is from: Home              Anticipated d/c is to: To be determined              Anticipated d/c date is: 48 to 72 hours              Patient currently not medically stable for discharge  Consultants:  GI, general surgery  Procedures:  ERCP pending  Antimicrobials:  Zosyn, ongoing  Subjective: No acute issues or events overnight, denies nausea vomiting diarrhea constipation headache fevers or chills  Objective: Vitals:   11/12/20 2104 11/13/20 0431 11/13/20 0507 11/13/20 0507  BP: (!) 154/87  (!) 166/86 (!) 166/86  Pulse: 64  (!) 58 75  Resp: 20  20 20   Temp: 98.7 F (37.1 C)  98.3 F (36.8 C) 98.3 F (36.8 C)  TempSrc: Oral     SpO2: 95%  93% 94%  Weight:  92.2 kg      Intake/Output Summary (Last 24 hours) at 11/13/2020 0823 Last data filed at 11/13/2020 0508 Gross per 24 hour  Intake 739.61 ml  Output 1100 ml  Net -360.39 ml    Filed Weights   11/12/20 0500 11/13/20 0431  Weight: 92 kg 92.2 kg    Examination:  General:  Pleasantly resting in bed, No acute distress. HEENT:  Normocephalic atraumatic.  Sclerae nonicteric, noninjected.  Extraocular movements intact bilaterally. Neck:  Without mass or deformity.  Trachea is midline. Lungs:  Clear to  auscultate bilaterally without rhonchi, wheeze, or rales. Heart:  Regular rate and rhythm.  Without murmurs, rubs, or gallops. Abdomen:  Soft, moderately tender PMI right upper quadrant.  Without guarding or rebound. Extremities: Without cyanosis, clubbing, edema, or obvious deformity. Vascular:  Dorsalis pedis and posterior tibial pulses palpable bilaterally. Skin:  Warm and dry, no erythema, no ulcerations.   Data Reviewed: I have personally reviewed following labs and imaging studies  CBC: Recent Labs  Lab 11/10/20 1518  11/11/20 0830 11/12/20 0055 11/13/20 0048  WBC 19.8* 16.3* 15.9* 11.3*  NEUTROABS 18.0*  --   --   --   HGB 12.5* 12.4* 12.4* 12.3*  HCT 37.7* 37.2* 37.5* 37.5*  MCV 94.5 94.4 95.2 95.2  PLT 169 145* 169 200    Basic Metabolic Panel: Recent Labs  Lab 11/11/20 0057 11/11/20 0830 11/11/20 0841 11/12/20 0055 11/13/20 0048  NA 126* 125* 129* 128* 130*  K 4.1 3.9 4.2 3.6 3.5  CL 93* 96* 99 94* 96*  CO2 23 21* 16* 24 23  GLUCOSE 102* 93 75 78 69*  BUN 11 9 11 9 12   CREATININE 0.88 0.75 0.75 0.85 1.01  CALCIUM 9.0 8.6* 8.7* 8.8* 8.6*    GFR: CrCl cannot be calculated (Unknown ideal weight.). Liver Function Tests: Recent Labs  Lab 11/10/20 1631 11/11/20 0830 11/12/20 0055 11/13/20 0048  AST 78* 54* 63* 56*  ALT 61* 50* 45* 42  ALKPHOS 45 39 46 44  BILITOT 2.1* 1.1 1.0 1.7*  PROT 6.5 6.0* 5.8* 5.9*  ALBUMIN 3.4* 3.3* 3.2* 3.1*    Recent Labs  Lab 11/10/20 1631 11/12/20 0055 11/13/20 0048  LIPASE 379* 181* 80*    No results for input(s): AMMONIA in the last 168 hours. Coagulation Profile: No results for input(s): INR, PROTIME in the last 168 hours. Cardiac Enzymes: No results for input(s): CKTOTAL, CKMB, CKMBINDEX, TROPONINI in the last 168 hours. BNP (last 3 results) No results for input(s): PROBNP in the last 8760 hours. HbA1C: No results for input(s): HGBA1C in the last 72 hours. CBG: No results for input(s): GLUCAP in the last 168 hours. Lipid Profile: No results for input(s): CHOL, HDL, LDLCALC, TRIG, CHOLHDL, LDLDIRECT in the last 72 hours. Thyroid Function Tests: Recent Labs    11/11/20 0057  TSH 1.847    Anemia Panel: No results for input(s): VITAMINB12, FOLATE, FERRITIN, TIBC, IRON, RETICCTPCT in the last 72 hours. Sepsis Labs: Recent Labs  Lab 11/10/20 1716  LATICACIDVEN 1.1     Recent Results (from the past 240 hour(s))  Blood culture (routine x 2)     Status: None (Preliminary result)   Collection Time: 11/10/20  4:34 PM    Specimen: BLOOD  Result Value Ref Range Status   Specimen Description BLOOD SITE NOT SPECIFIED  Final   Special Requests   Final    BOTTLES DRAWN AEROBIC AND ANAEROBIC Blood Culture adequate volume   Culture   Final    NO GROWTH 3 DAYS Performed at [facility], [address], [place], [place]    Report Status PENDING  Incomplete  Blood culture (routine x 2)     Status: None (Preliminary result)   Collection Time: 11/10/20  5:16 PM   Specimen: BLOOD  Result Value Ref Range Status   Specimen Description BLOOD BLOOD RIGHT FOREARM  Final   Special Requests   Final    BOTTLES DRAWN AEROBIC AND ANAEROBIC Blood Culture adequate volume   Culture   Final  NO GROWTH 3 DAYS Performed at [facility], [address], [place], [place] [address]    Report Status PENDING  Incomplete  Resp Panel by RT-PCR (Flu A&B, Covid) Nasopharyngeal Swab     Status: None   Collection Time: 11/10/20  6:29 PM   Specimen: Nasopharyngeal Swab; Nasopharyngeal(NP) swabs in vial transport medium  Result Value Ref Range Status   SARS Coronavirus 2 by RT PCR NEGATIVE NEGATIVE Final    Comment: (NOTE) SARS-CoV-2 target nucleic acids are NOT DETECTED.  The SARS-CoV-2 RNA is generally detectable in upper respiratory specimens during the acute phase of infection. The lowest concentration of SARS-CoV-2 viral copies this assay can detect is 138 copies/mL. A negative result does not preclude SARS-Cov-2 infection and should not be used as the sole basis for treatment or other patient management decisions. A negative result may occur with  improper specimen collection/handling, submission of specimen other than nasopharyngeal swab, presence of viral mutation(s) within the areas targeted by this assay, and inadequate number of viral copies(<138 copies/mL). A negative result must be combined with clinical observations, patient history, and epidemiological information. The expected result is  Negative.  Fact Sheet for Patients:  BloggerCourse.com  Fact Sheet for Healthcare Providers:  SeriousBroker.it  This test is no t yet approved or cleared by the [place] FDA and  has been authorized for detection and/or diagnosis of SARS-CoV-2 by FDA under an Emergency Use Authorization (EUA). This EUA will remain  in effect (meaning this test can be used) for the duration of the COVID-19 declaration under Section 564(b)(1) of the Act, 21 U.S.C.section 360bbb-3(b)(1), unless the authorization is terminated  or revoked sooner.       Influenza A by PCR NEGATIVE NEGATIVE Final   Influenza B by PCR NEGATIVE NEGATIVE Final    Comment: (NOTE) The Xpert Xpress SARS-CoV-2/FLU/RSV plus assay is intended as an aid in the diagnosis of influenza from Nasopharyngeal swab specimens and should not be used as a sole basis for treatment. Nasal washings and aspirates are unacceptable for Xpert Xpress SARS-CoV-2/FLU/RSV testing.  Fact Sheet for Patients: BloggerCourse.com  Fact Sheet for Healthcare Providers: SeriousBroker.it  This test is not yet approved or cleared by the [place] FDA and has been authorized for detection and/or diagnosis of SARS-CoV-2 by FDA under an Emergency Use Authorization (EUA). This EUA will remain in effect (meaning this test can be used) for the duration of the COVID-19 declaration under Section 564(b)(1) of the Act, 21 U.S.C. section 360bbb-3(b)(1), unless the authorization is terminated or revoked.  Performed at [facility], [address] [place], [place] [address]           Radiology Studies: No results found.  Scheduled Meds:  [MAR Hold] carvedilol  25 mg Oral BID WC   [MAR Hold] pantoprazole  40 mg Oral Daily   [MAR Hold] sertraline  25 mg Oral TID   Continuous Infusions:  [MAR Hold] piperacillin-tazobactam (ZOSYN)  IV 3.375 g  (11/13/20 0219)     LOS: 3 days   Time spent:  Azucena Fallen, DO [facility]  If 7PM-7AM, please contact night-coverage [email or web address]  11/13/2020, 8:23 AM

## 2020-11-13 NOTE — Op Note (Signed)
11/10/2020 - 11/13/2020  11:34 AM  PATIENT:  Brian Ruiz  75 y.o. male  PRE-OPERATIVE DIAGNOSIS:  cholecystitis  POST-OPERATIVE DIAGNOSIS:  cholecystitis  PROCEDURE:  Procedure(s): LAPAROSCOPIC CHOLECYSTECTOMY WITH INTRAOPERATIVE CHOLANGIOGRAM  SURGEON:  Surgeon(s): Violeta Gelinas, MD  ASSISTANTS: Lindell Noe, RNFA  ANESTHESIA:   local and general  EBL:  Total I/O In: 950 [I.V.:700; IV Piggyback:250] Out: 50 [Blood:50]  BLOOD ADMINISTERED:none  DRAINS: none   SPECIMEN:  Excision  DISPOSITION OF SPECIMEN:  PATHOLOGY  COUNTS:  YES  DICTATION: .Dragon Dictation Findings: Evidence of chronic cholecystitis, cholangiogram initially showed dilated common bile duct I was able to flush contrast through with good flow into the duodenum.  Biliary system is quite dilated.  No filling defects after flushing the contrast through.  Procedure in detail: Informed consent was obtained.  He is on IV antibiotics.  He was brought to the operating room and general endotracheal anesthesia was administered by the anesthesia staff.  His abdomen was prepped and draped in sterile fashion.  Timeout procedure was performed.The infraumbilical region was infiltrated with local. Infraumbilical incision was made. Subcutaneous tissues were dissected down revealing the anterior fascia. This was divided sharply along the midline. Peritoneal cavity was entered under direct vision without complication. A 0 Vicryl pursestring was placed around the fascial opening. Hassan trocar was inserted into the abdomen. The abdomen was insufflated with carbon dioxide in standard fashion. Under direct vision a 5 mm epigastric and 5 mm right abdominal port x2 were placed.  Local was used at each port site.  Laparoscopic exploration revealed omentum adherent and covering the gallbladder.  This was gently and gradually taken down revealing the dome of the gallbladder.  The dome was retracted superior medially.  Gradually I was  able to clear the omentum off the gallbladder revealing the body and infundibulum.  The infundibulum was retracted inferior laterally.  Dissection began laterally and progressed medially identifying the cystic artery and the cystic duct.  Cystic artery was clipped twice proximally, once distally and divided.  The cystic duct was dissected until we had a critical view of safety.  I then placed a clip on the infundibular cystic duct junction.  I made a small nick in the cystic duct and inserted a Reddick cholangiogram catheter.  Intraoperative cholangiogram initially showed dilated common bile duct with no flow into the duodenum.  With further contrast flushing contrast went into the duodenum.  The common hepatic and common bile duct were dilated but there were no filling defects.  The cholangiogram catheter was removed.  I closed the cystic duct stump with a PDS Endoloop and there was excellent closure.  The gallbladder was taken off the liver bed using cautery and achieving excellent hemostasis.  It was placed in a bag and removed from the abdomen.  It was sent to pathology.  The liver bed was cauterized to get good hemostasis.  The area was copiously irrigated.  Irrigation fluid was evacuated.  The liver bed was dry.  The cystic duct stump with Endoloop was intact.  Other clips were in good position.  Ports were then removed under direct vision.  Pneumoperitoneum was released.  Infraumbilical fascia was closed by tying the pursestring.  All 4 wounds were irrigated and the skin of each was closed with 4-0 Vicryl followed by Dermabond.  All counts were correct.  He tolerated the procedure without apparent complication and was taken to the recovery room in stable condition.  PATIENT DISPOSITION:  PACU - hemodynamically stable.  Delay start of Pharmacological VTE agent (>24hrs) due to surgical blood loss or risk of bleeding:  no  Violeta Gelinas, MD, MPH, FACS Pager: [phone]  6/17/202211:34  AM

## 2020-11-14 ENCOUNTER — Encounter (HOSPITAL_COMMUNITY): Payer: Self-pay | Admitting: General Surgery

## 2020-11-14 LAB — COMPREHENSIVE METABOLIC PANEL
ALT: 46 U/L — ABNORMAL HIGH (ref 0–44)
AST: 50 U/L — ABNORMAL HIGH (ref 15–41)
Albumin: 3 g/dL — ABNORMAL LOW (ref 3.5–5.0)
Alkaline Phosphatase: 39 U/L (ref 38–126)
Anion gap: 11 (ref 5–15)
BUN: 11 mg/dL (ref 8–23)
CO2: 23 mmol/L (ref 22–32)
Calcium: 8.6 mg/dL — ABNORMAL LOW (ref 8.9–10.3)
Chloride: 97 mmol/L — ABNORMAL LOW (ref 98–111)
Creatinine, Ser: 1.01 mg/dL (ref 0.61–1.24)
GFR, Estimated: >60 mL/min (ref >60)
Glucose, Bld: 98 mg/dL (ref 70–99)
Potassium: 3.8 mmol/L (ref 3.5–5.1)
Sodium: 131 mmol/L — ABNORMAL LOW (ref 135–145)
Total Bilirubin: 1.3 mg/dL — ABNORMAL HIGH (ref 0.3–1.2)
Total Protein: 5.6 g/dL — ABNORMAL LOW (ref 6.5–8.1)

## 2020-11-14 LAB — CBC
HCT: 36.3 % — ABNORMAL LOW (ref 39.0–52.0)
Hemoglobin: 11.8 g/dL — ABNORMAL LOW (ref 13.0–17.0)
MCH: 31.1 pg (ref 26.0–34.0)
MCHC: 32.5 g/dL (ref 30.0–36.0)
MCV: 95.8 fL (ref 80.0–100.0)
Platelets: 195 10*3/uL (ref 150–400)
RBC: 3.79 MIL/uL — ABNORMAL LOW (ref 4.22–5.81)
RDW: 12.9 % (ref 11.5–15.5)
WBC: 17.3 10*3/uL — ABNORMAL HIGH (ref 4.0–10.5)
nRBC: 0 % (ref 0.0–0.2)

## 2020-11-14 MED ORDER — SENNOSIDES-DOCUSATE SODIUM 8.6-50 MG PO TABS
2.0000 | ORAL_TABLET | Freq: Two times a day (BID) | ORAL | Status: DC
  Administered 2020-11-14 (×2): 2 via ORAL
  Filled 2020-11-14 (×3): qty 2

## 2020-11-14 MED ORDER — MAGNESIUM CITRATE PO SOLN
1.0000 | Freq: Once | ORAL | Status: AC
  Administered 2020-11-14: 1 via ORAL
  Filled 2020-11-14: qty 296

## 2020-11-14 MED ORDER — HYDROCODONE-ACETAMINOPHEN 5-325 MG PO TABS: 1.0000 | ORAL_TABLET | ORAL | Status: DC | PRN

## 2020-11-14 MED ORDER — ACETAMINOPHEN 325 MG PO TABS
650.0000 mg | ORAL_TABLET | Freq: Four times a day (QID) | ORAL | Status: DC | PRN
  Administered 2020-11-14 – 2020-11-15 (×2): 650 mg via ORAL
  Filled 2020-11-14 (×2): qty 2

## 2020-11-14 NOTE — Progress Notes (Signed)
Occupational Therapy Treatment Patient Details Name: Brian Ruiz MRN: [id number] DOB: June 04, 1945 Today's Date: 11/14/2020    History of present illness Pt is a 75 y.o. M admitted to [facility] after 3 falls, the last fall resulting in pt hitting his head. Upon evaluation in ED, pt was found tohave pancreatitis and choledocholithiasis. On 6/17 pt had s/p laparoscopic cholecystectomy. Significant past medical history of arthritis, chronic back pain, hypertension, upper GI bleed secondary to PUD 05/2019.  Past thyroidectomy and left inguinal hernia repair.   OT comments  Pt activity tolerance is increasing this session, pt tolerated 5 mins of OOB activity before requiring a seated rest break. Pt continues to move slowly and require cueing/education prior to making any steps forward/big movements. Overall pt remains at min guard for safety due to standing balance and requring cues. Acute OT will continue to follow to ensure pt safety prior to returning home.    Follow Up Recommendations  Home health OT    Equipment Recommendations  3 in 1 bedside commode;Tub/shower seat;Other (comment)    Recommendations for Other Services      Precautions / Restrictions Precautions Precautions: Fall Precaution Comments: Pt reports he has fainting spells at times, as well as chronic pain in his back and R hip joint       Mobility Bed Mobility Overal bed mobility: Needs Assistance Bed Mobility: Supine to Sit;Sit to Supine     Supine to sit: Supervision;HOB elevated Sit to supine: Supervision;HOB elevated   General bed mobility comments: Sup for safety    Transfers Overall transfer level: Needs assistance Equipment used: Rolling walker (2 wheeled) Transfers: Sit to/from Stand Sit to Stand: Min guard;From elevated surface         General transfer comment: min guard for intial balance. Sit to stand 2x with therapy. Closer gurading required to transfer from the bed    Balance Overall balance  assessment: Needs assistance Sitting-balance support: No upper extremity supported Sitting balance-Leahy Scale: Good     Standing balance support: Bilateral upper extremity supported Standing balance-Leahy Scale: Fair Standing balance comment: uses walker for balance                           ADL either performed or assessed with clinical judgement   ADL Overall ADL's : Needs assistance/impaired Eating/Feeding: Set up;Sitting Eating/Feeding Details (indicate cue type and reason): Eating at end of session Grooming: Wash/dry hands;Min guard;Standing Grooming Details (indicate cue type and reason): Completed at sink                         Tub/ Banker: Min guard;Ambulation;3 in 1 Tub/Shower Transfer Details (indicate cue type and reason): Pt can complete in bathroom on raised toilet with min guard for sfaety Functional mobility during ADLs: Min guard;Rolling walker General ADL Comments: Pt moving slowly and cautiously, requiring cues throuhgout session for all movments, however tolerating OOB activities for 5 mins and overall at min guard level for safety/     Vision       Perception     Praxis      Cognition Arousal/Alertness: Awake/alert Behavior During Therapy: WFL for tasks assessed/performed Overall Cognitive Status: History of cognitive impairments - at baseline  Exercises     Shoulder Instructions       General Comments      Pertinent Vitals/ Pain       Pain Assessment: 0-10 Pain Score: 4  Pain Location: abdomen/surgical site Pain Descriptors / Indicators: Discomfort;Operative site guarding Pain Intervention(s): Limited activity within patient's tolerance;Monitored during session;Repositioned  Home Living                                          Prior Functioning/Environment              Frequency  Min 2X/week        Progress Toward  Goals  OT Goals(current goals can now be found in the care plan section)  Progress towards OT goals: Progressing toward goals  Acute Rehab OT Goals Patient Stated Goal: To improve strength OT Goal Formulation: With patient/family Time For Goal Achievement: 11/27/20 Potential to Achieve Goals: Good ADL Goals Pt Will Perform Grooming: with supervision;standing Pt Will Transfer to Toilet: with supervision;ambulating Pt Will Perform Toileting - Clothing Manipulation and hygiene: with supervision;sitting/lateral leans;sit to/from stand Additional ADL Goal #1: Goal Met  Plan Discharge plan remains appropriate;Frequency remains appropriate    Co-evaluation                 AM-PAC OT "6 Clicks" Daily Activity     Outcome Measure   Help from another person eating meals?: A Little Help from another person taking care of personal grooming?: A Little Help from another person toileting, which includes using toliet, bedpan, or urinal?: A Little Help from another person bathing (including washing, rinsing, drying)?: A Little Help from another person to put on and taking off regular upper body clothing?: A Little Help from another person to put on and taking off regular lower body clothing?: A Little 6 Click Score: 18    End of Session Equipment Utilized During Treatment: Rolling walker  OT Visit Diagnosis: Unsteadiness on feet (R26.81);Other abnormalities of gait and mobility (R26.89);Muscle weakness (generalized) (M62.81)   Activity Tolerance Patient tolerated treatment well   Patient Left in bed;with call bell/phone within reach;with family/visitor present   Nurse Communication Mobility status        Time: 5929-2446 OT Time Calculation (min): 45 min  Charges: OT General Charges $OT Visit: 1 Visit OT Treatments $Self Care/Home Management : 23-37 mins $Therapeutic Activity: 8-22 mins  Vitor Overbaugh H., OTR/L Acute Rehabilitation   Oliverio Cho Elane Yolanda Bonine 11/14/2020, 6:47 PM

## 2020-11-14 NOTE — Progress Notes (Signed)
PROGRESS NOTE    Brian Ruiz  GEZ:[id number] DOB: May 09, 1946 DOA: 11/10/2020 PCP: Tracey Harries, MD   Brief Narrative:  Brian Ruiz is a 75 y.o. male with history of hypertension PVCs and peptic ulcer disease who has had GI bleed previously seen by gastroenterologist and colonoscopy last EGD was in January 2021 has had a fall today and was brought to the ER.  Patient had 2 falls 1 at 4 AM but at the time EMS advised coming to ER but patient refused.  Subsequent which patient had another fall at around 11 AM and was brought to the ER.  The exact circumstances of the fall is not clear.  Patient did not lose consciousness.  In the ED CT abdomen pelvis was done which shows features concerning for acute cholecystitis with choledocholithiasis and pancreatitis.  ER physician started patient on fluids and empiric antibiotics and admitted for further management of acute pancreatitis likely biliary in origin.  Patient denies drinking alcohol.  Labs otherwise showed WBC of 19.8 hemoglobin 12.5.  Lactic acid was normal.  Assessment & Plan:   Principal Problem:   Acute pancreatitis Active Problems:   Hypertension  Acute pancreatitis Cannot rule out acute cholecystitis -GI, surgery to follow along, appreciate insight and recommendations  -MRCP showed signs of cholecystitis with questionable intra and extrahepatic ductal dilation without overt signs of choledocholithiasis or obstruction -ERCP discontinued -Repeat labs stable - lipase down -Lap chole 11/13/2020, tolerated well  Leukocytosis, likely reactive -Likely reactive postoperatively, will continue to follow, clinically patient is improving denies fevers chills no other signs or symptoms of acute infection at this point  Profound hypovolemic hyponatremia, resolving -Resolving with IV fluids confirming likely hypovolemic in etiology; wife indicates poor p.o. intake at bedside previously, today she indicates the patient "drinks lots of water" -we  will continue to follow, unclear now if this is poor p.o. intake and dehydration versus SIADH or inappropriate intake of excess water  Essential hypertension  -Continue carvedilol  DVT prophylaxis: Lovenox Code Status: Full code. Family Communication: Wife at bedside  Status is: Inpatient  Dispo: The patient is from: Home              Anticipated d/c is to: To be determined              Anticipated d/c date is: 48 to 72 hours              Patient currently not medically stable for discharge  Consultants:  GI, general surgery  Procedures:  ERCP pending  Antimicrobials:  Zosyn, ongoing  Subjective: No acute issues or events overnight, denies nausea vomiting headache fevers chills diarrhea chest pain shortness of breath.  Patient does endorse constipation, no bowel movement in at least 3 days  Objective: Vitals:   11/13/20 1221 11/13/20 2138 11/14/20 0418 11/14/20 0426  BP: 140/77 125/71  132/82  Pulse: 74 70  77  Resp: 19 18  17   Temp: 98.2 F (36.8 C) 98.1 F (36.7 C)  98.1 F (36.7 C)  TempSrc: Oral Oral    SpO2: 94% 95%  96%  Weight:   92 kg     Intake/Output Summary (Last 24 hours) at 11/14/2020 0752 Last data filed at 11/14/2020 0529 Gross per 24 hour  Intake 1450 ml  Output 1225 ml  Net 225 ml    Filed Weights   11/12/20 0500 11/13/20 0431 11/14/20 0418  Weight: 92 kg 92.2 kg 92 kg    Examination:  General:  Pleasantly resting in bed, No acute distress. HEENT:  Normocephalic atraumatic.  Sclerae nonicteric, noninjected.  Extraocular movements intact bilaterally. Neck:  Without mass or deformity.  Trachea is midline. Lungs:  Clear to auscultate bilaterally without rhonchi, wheeze, or rales. Heart:  Regular rate and rhythm.  Without murmurs, rubs, or gallops. Abdomen: Soft nontender, post laparoscopic surgical sites clean dry intact  Extremities: Without cyanosis, clubbing, edema, or obvious deformity. Vascular:  Dorsalis pedis and posterior tibial  pulses palpable bilaterally. Skin:  Warm and dry, no erythema, no ulcerations.   Data Reviewed: I have personally reviewed following labs and imaging studies  CBC: Recent Labs  Lab 11/10/20 1518 11/11/20 0830 11/12/20 0055 11/13/20 0048 11/14/20 0222  WBC 19.8* 16.3* 15.9* 11.3* 17.3*  NEUTROABS 18.0*  --   --   --   --   HGB 12.5* 12.4* 12.4* 12.3* 11.8*  HCT 37.7* 37.2* 37.5* 37.5* 36.3*  MCV 94.5 94.4 95.2 95.2 95.8  PLT 169 145* 169 200 195    Basic Metabolic Panel: Recent Labs  Lab 11/11/20 0830 11/11/20 0841 11/12/20 0055 11/13/20 0048 11/14/20 0222  NA 125* 129* 128* 130* 131*  K 3.9 4.2 3.6 3.5 3.8  CL 96* 99 94* 96* 97*  CO2 21* 16* 24 23 23   GLUCOSE 93 75 78 69* 98  BUN 9 11 9 12 11   CREATININE 0.75 0.75 0.85 1.01 1.01  CALCIUM 8.6* 8.7* 8.8* 8.6* 8.6*    GFR: CrCl cannot be calculated (Unknown ideal weight.). Liver Function Tests: Recent Labs  Lab 11/10/20 1631 11/11/20 0830 11/12/20 0055 11/13/20 0048 11/14/20 0222  AST 78* 54* 63* 56* 50*  ALT 61* 50* 45* 42 46*  ALKPHOS 45 39 46 44 39  BILITOT 2.1* 1.1 1.0 1.7* 1.3*  PROT 6.5 6.0* 5.8* 5.9* 5.6*  ALBUMIN 3.4* 3.3* 3.2* 3.1* 3.0*    Recent Labs  Lab 11/10/20 1631 11/12/20 0055 11/13/20 0048  LIPASE 379* 181* 80*    No results for input(s): AMMONIA in the last 168 hours. Coagulation Profile: No results for input(s): INR, PROTIME in the last 168 hours. Cardiac Enzymes: No results for input(s): CKTOTAL, CKMB, CKMBINDEX, TROPONINI in the last 168 hours. BNP (last 3 results) No results for input(s): PROBNP in the last 8760 hours. HbA1C: No results for input(s): HGBA1C in the last 72 hours. CBG: No results for input(s): GLUCAP in the last 168 hours. Lipid Profile: No results for input(s): CHOL, HDL, LDLCALC, TRIG, CHOLHDL, LDLDIRECT in the last 72 hours. Thyroid Function Tests: No results for input(s): TSH, T4TOTAL, FREET4, T3FREE, THYROIDAB in the last 72 hours.  Anemia  Panel: No results for input(s): VITAMINB12, FOLATE, FERRITIN, TIBC, IRON, RETICCTPCT in the last 72 hours. Sepsis Labs: Recent Labs  Lab 11/10/20 1716  LATICACIDVEN 1.1     Recent Results (from the past 240 hour(s))  Blood culture (routine x 2)     Status: None (Preliminary result)   Collection Time: 11/10/20  4:34 PM   Specimen: BLOOD  Result Value Ref Range Status   Specimen Description BLOOD SITE NOT SPECIFIED  Final   Special Requests   Final    BOTTLES DRAWN AEROBIC AND ANAEROBIC Blood Culture adequate volume   Culture   Final    NO GROWTH 3 DAYS Performed at [facility], [address] [place], [address] [place]    Report Status PENDING  Incomplete  Blood culture (routine x 2)     Status: None (Preliminary result)   Collection Time:  11/10/20  5:16 PM   Specimen: BLOOD  Result Value Ref Range Status   Specimen Description BLOOD BLOOD RIGHT FOREARM  Final   Special Requests   Final    BOTTLES DRAWN AEROBIC AND ANAEROBIC Blood Culture adequate volume   Culture   Final    NO GROWTH 3 DAYS Performed at [facility], [address] [place], [place] [address]    Report Status PENDING  Incomplete  Resp Panel by RT-PCR (Flu A&B, Covid) Nasopharyngeal Swab     Status: None   Collection Time: 11/10/20  6:29 PM   Specimen: Nasopharyngeal Swab; Nasopharyngeal(NP) swabs in vial transport medium  Result Value Ref Range Status   SARS Coronavirus 2 by RT PCR NEGATIVE NEGATIVE Final    Comment: (NOTE) SARS-CoV-2 target nucleic acids are NOT DETECTED.  The SARS-CoV-2 RNA is generally detectable in upper respiratory specimens during the acute phase of infection. The lowest concentration of SARS-CoV-2 viral copies this assay can detect is 138 copies/mL. A negative result does not preclude SARS-Cov-2 infection and should not be used as the sole basis for treatment or other patient management decisions. A negative result may occur with  improper specimen  collection/handling, submission of specimen other than nasopharyngeal swab, presence of viral mutation(s) within the areas targeted by this assay, and inadequate number of viral copies(<138 copies/mL). A negative result must be combined with clinical observations, patient history, and epidemiological information. The expected result is Negative.  Fact Sheet for Patients:  BloggerCourse.com  Fact Sheet for Healthcare Providers:  SeriousBroker.it  This test is no t yet approved or cleared by the [place] FDA and  has been authorized for detection and/or diagnosis of SARS-CoV-2 by FDA under an Emergency Use Authorization (EUA). This EUA will remain  in effect (meaning this test can be used) for the duration of the COVID-19 declaration under Section 564(b)(1) of the Act, 21 U.S.C.section 360bbb-3(b)(1), unless the authorization is terminated  or revoked sooner.       Influenza A by PCR NEGATIVE NEGATIVE Final   Influenza B by PCR NEGATIVE NEGATIVE Final    Comment: (NOTE) The Xpert Xpress SARS-CoV-2/FLU/RSV plus assay is intended as an aid in the diagnosis of influenza from Nasopharyngeal swab specimens and should not be used as a sole basis for treatment. Nasal washings and aspirates are unacceptable for Xpert Xpress SARS-CoV-2/FLU/RSV testing.  Fact Sheet for Patients: BloggerCourse.com  Fact Sheet for Healthcare Providers: SeriousBroker.it  This test is not yet approved or cleared by the [place] FDA and has been authorized for detection and/or diagnosis of SARS-CoV-2 by FDA under an Emergency Use Authorization (EUA). This EUA will remain in effect (meaning this test can be used) for the duration of the COVID-19 declaration under Section 564(b)(1) of the Act, 21 U.S.C. section 360bbb-3(b)(1), unless the authorization is terminated or revoked.  Performed at [facility], [address], [place], [place] [address]           Radiology Studies: DG Cholangiogram Operative  Result Date: 11/13/2020 CLINICAL DATA:  76 year old male with cholecystitis EXAM: INTRAOPERATIVE CHOLANGIOGRAM TECHNIQUE: Cholangiographic images from the C-arm fluoroscopic device were submitted for interpretation post-operatively. Please see the procedural report for the amount of contrast and the fluoroscopy time utilized. COMPARISON:  None. FINDINGS: Surgical instruments project over the upper abdomen. There is cannulation of the cystic duct/gallbladder neck, with antegrade infusion of contrast. Caliber of the extrahepatic ductal system is enlarged No definite filling defect within the extrahepatic ducts  identified. Free flow of contrast across the ampulla. IMPRESSION: Intraoperative cholangiogram demonstrates enlarged extrahepatic biliary ducts, with no definite filling defects identified. Free flow of contrast across the ampulla. Please refer to the dictated operative report for full details of intraoperative findings and procedure Electronically Signed   By: Gilmer MorJaime  Wagner D.O.   On: 11/13/2020 11:24    Scheduled Meds:  carvedilol  25 mg Oral BID WC   enoxaparin (LOVENOX) injection  40 mg Subcutaneous Q24H   pantoprazole  40 mg Oral Daily   sertraline  25 mg Oral TID   Continuous Infusions:  sodium chloride 20 mL/hr at 11/13/20 1235   lactated ringers 10 mL/hr at 11/13/20 0949   piperacillin-tazobactam (ZOSYN)  IV 3.375 g (11/14/20 0236)     LOS: 4 days   Time spent: 40min  Azucena FallenWilliam C Penn Grissett, DO [facility]  If 7PM-7AM, please contact night-coverage [email or web address]  11/14/2020, 7:52 AM

## 2020-11-14 NOTE — Plan of Care (Signed)

## 2020-11-14 NOTE — Progress Notes (Signed)
[facility] Progress Note:   1 Day Post-Op  Subjective: Mental status is clear.  Complaints weakness. Objective: Vital signs in last 24 hours: Temp:  [97.5 F (36.4 C)-98.2 F (36.8 C)] 98.1 F (36.7 C) (06/18 0426) Pulse Rate:  [70-77] 77 (06/18 0426) Resp:  [15-20] 17 (06/18 0426) BP: (121-140)/(68-82) 132/82 (06/18 0426) SpO2:  [92 %-96 %] 96 % (06/18 0426) Weight:  [92 kg] 92 kg (06/18 0418)  Intake/Output from previous day: 06/17 0701 - 06/18 0700 In: 1450 [P.O.:200; I.V.:1000; IV Piggyback:250] Out: 1225 [Urine:1175; Blood:50] Intake/Output this shift: No intake/output data recorded.  Physical Exam: Work of breathing is not labored;  no abdominal pain except postop soreness  Lab Results:  Results for orders placed or performed during the hospital encounter of 11/10/20 (from the past 48 hour(s))  CBC     Status: Abnormal   Collection Time: 11/13/20 12:48 AM  Result Value Ref Range   WBC 11.3 (H) 4.0 - 10.5 K/uL   RBC 3.94 (L) 4.22 - 5.81 MIL/uL   Hemoglobin 12.3 (L) 13.0 - 17.0 g/dL   HCT 74.2 (L) 59.5 - 63.8 %   MCV 95.2 80.0 - 100.0 fL   MCH 31.2 26.0 - 34.0 pg   MCHC 32.8 30.0 - 36.0 g/dL   RDW 75.6 43.3 - 29.5 %   Platelets 200 150 - 400 K/uL   nRBC 0.0 0.0 - 0.2 %    Comment: Performed at [facility], [address], [place], [place] [address]  Comprehensive metabolic panel     Status: Abnormal   Collection Time: 11/13/20 12:48 AM  Result Value Ref Range   Sodium 130 (L) 135 - 145 mmol/L   Potassium 3.5 3.5 - 5.1 mmol/L   Chloride 96 (L) 98 - 111 mmol/L   CO2 23 22 - 32 mmol/L   Glucose, Bld 69 (L) 70 - 99 mg/dL    Comment: Glucose reference range applies only to samples taken after fasting for at least 8 hours.   BUN 12 8 - 23 mg/dL   Creatinine, Ser 6.60 0.61 - 1.24 mg/dL   Calcium 8.6 (L) 8.9 - 10.3 mg/dL   Total Protein 5.9 (L) 6.5 - 8.1 g/dL   Albumin 3.1 (L) 3.5 - 5.0 g/dL   AST 56 (H) 15 - 41 U/L   ALT 42 0 - 44 U/L    Alkaline Phosphatase 44 38 - 126 U/L   Total Bilirubin 1.7 (H) 0.3 - 1.2 mg/dL   GFR, Estimated >63 >01 mL/min    Comment: (NOTE) Calculated using the CKD-EPI Creatinine Equation (2021)    Anion gap 11 5 - 15    Comment: Performed at [facility], [address], [place], [place] [address]  Lipase, blood     Status: Abnormal   Collection Time: 11/13/20 12:48 AM  Result Value Ref Range   Lipase 80 (H) 11 - 51 U/L    Comment: Performed at [facility], [address], [place], [place] [address]  CBC     Status: Abnormal   Collection Time: 11/14/20  2:22 AM  Result Value Ref Range   WBC 17.3 (H) 4.0 - 10.5 K/uL   RBC 3.79 (L) 4.22 - 5.81 MIL/uL   Hemoglobin 11.8 (L) 13.0 - 17.0 g/dL   HCT 73.2 (L) 20.2 - 54.2 %   MCV 95.8 80.0 - 100.0 fL   MCH 31.1 26.0 - 34.0 pg   MCHC 32.5 30.0 - 36.0  g/dL   RDW 16.1 09.6 - 04.5 %   Platelets 195 150 - 400 K/uL   nRBC 0.0 0.0 - 0.2 %    Comment: Performed at [facility], [address] [place], [place] [address]  Comprehensive metabolic panel     Status: Abnormal   Collection Time: 11/14/20  2:22 AM  Result Value Ref Range   Sodium 131 (L) 135 - 145 mmol/L   Potassium 3.8 3.5 - 5.1 mmol/L   Chloride 97 (L) 98 - 111 mmol/L   CO2 23 22 - 32 mmol/L   Glucose, Bld 98 70 - 99 mg/dL    Comment: Glucose reference range applies only to samples taken after fasting for at least 8 hours.   BUN 11 8 - 23 mg/dL   Creatinine, Ser 1.91 0.61 - 1.24 mg/dL   Calcium 8.6 (L) 8.9 - 10.3 mg/dL   Total Protein 5.6 (L) 6.5 - 8.1 g/dL   Albumin 3.0 (L) 3.5 - 5.0 g/dL   AST 50 (H) 15 - 41 U/L   ALT 46 (H) 0 - 44 U/L   Alkaline Phosphatase 39 38 - 126 U/L   Total Bilirubin 1.3 (H) 0.3 - 1.2 mg/dL   GFR, Estimated >47 >82 mL/min    Comment: (NOTE) Calculated using the CKD-EPI Creatinine Equation (2021)    Anion gap 11 5 - 15    Comment: Performed at [facility], [address], [place], [place] [address]    Radiology/Results: DG  Cholangiogram Operative  Result Date: 11/13/2020 CLINICAL DATA:  75 year old male with cholecystitis EXAM: INTRAOPERATIVE CHOLANGIOGRAM TECHNIQUE: Cholangiographic images from the C-arm fluoroscopic device were submitted for interpretation post-operatively. Please see the procedural report for the amount of contrast and the fluoroscopy time utilized. COMPARISON:  None. FINDINGS: Surgical instruments project over the upper abdomen. There is cannulation of the cystic duct/gallbladder neck, with antegrade infusion of contrast. Caliber of the extrahepatic ductal system is enlarged No definite filling defect within the extrahepatic ducts identified. Free flow of contrast across the ampulla. IMPRESSION: Intraoperative cholangiogram demonstrates enlarged extrahepatic biliary ducts, with no definite filling defects identified. Free flow of contrast across the ampulla. Please refer to the dictated operative report for full details of intraoperative findings and procedure Electronically Signed   By: Gilmer Mor D.O.   On: 11/13/2020 11:24    Anti-infectives: Anti-infectives (From admission, onward)    Start     Dose/Rate Route Frequency Ordered Stop   11/11/20 0200  piperacillin-tazobactam (ZOSYN) IVPB 3.375 g        3.375 g 12.5 mL/hr over 240 Minutes Intravenous Every 8 hours 11/11/20 0104 11/14/20 2359   11/10/20 2100  ceFEPIme (MAXIPIME) 2 g in sodium chloride 0.9 % 100 mL IVPB        2 g 200 mL/hr over 30 Minutes Intravenous  Once 11/10/20 2053 11/11/20 0042   11/10/20 2015  ceFEPIme (MAXIPIME) 1 g in sodium chloride 0.9 % 100 mL IVPB  Status:  Discontinued       See Hyperspace for full Linked Orders Report.   1 g 200 mL/hr over 30 Minutes Intravenous  Once 11/10/20 2009 11/10/20 2350   11/10/20 2015  metroNIDAZOLE (FLAGYL) IVPB 500 mg       See Hyperspace for full Linked Orders Report.   500 mg 100 mL/hr over 60 Minutes Intravenous  Once 11/10/20 2009 11/10/20 2321        Assessment/Plan: Problem List: Patient Active Problem List   Diagnosis Date  Noted   Acute pancreatitis 11/10/2020   Cholecystitis    Choledocholithiasis    Chronic diastolic CHF (congestive heart failure) (HCC) 05/16/2018   Chronic fatigue 06/14/2017   Palpitations 06/14/2017   Aortic ectasia (HCC) 11/15/2016   Age-related osteoporosis without current pathological fracture 10/27/2016   Screening for AAA (abdominal aortic aneurysm) 10/27/2016   Screening for prostate cancer 10/27/2016   Need for hepatitis C screening test 10/27/2016   Encounter for general adult medical examination without abnormal findings 11/26/2013   Screening for osteoporosis 11/26/2013   Kyphoscoliosis deformity of spine 07/04/2013   Enlarged prostate with lower urinary tract symptoms (LUTS) 04/12/2013   Screen for colon cancer 04/12/2013   Undiagnosed cardiac murmurs 04/12/2013   Backache 04/12/2013   White coat syndrome with hypertension 04/12/2013   Lumbar spinal stenosis 10/29/2012   DDD (degenerative disc disease), lumbosacral 03/16/2012   Lumbosacral radiculopathy 03/16/2012   Generalized anxiety disorder 03/14/2012   Renal pain 12/16/2011   Sacroiliitis, not elsewhere classified (HCC) 08/11/2011   Sacroiliac joint pain 07/27/2011   Bilateral hip pain 07/07/2011   Chronic pain syndrome 07/07/2011   Spondylolisthesis of lumbosacral region 07/07/2011   Hereditary and idiopathic peripheral neuropathy 05/27/2011   Accidental drug overdose 05/15/2011   Guilty feelings 05/15/2011   Hypertension 05/15/2011   Hypotension 05/15/2011   Bradycardia 05/15/2011   Encephalopathy acute 05/15/2011   Chronic back pain 05/15/2011   Vitamin D deficiency 03/08/2011    Significant deconditioning;  post lap chole day 1;  awaiting PT;  not able to go home 1 Day Post-Op    LOS: 4 days   Matt B. Daphine Deutscher, MD, [facility] [phone] to reach the surgeon on call.    11/14/2020 10:13  AM

## 2020-11-14 NOTE — Evaluation (Signed)
Physical Therapy Evaluation Patient Details Name: Brian Ruiz MRN: [id number] DOB: 1945/12/05 Today's Date: 11/14/2020   History of Present Illness  Pt is a 75 y.o. M admitted to [facility] after 3 falls, the last fall resulting in pt hitting his head. Upon evaluation in ED, pt was found tohave pancreatitis and choledocholithiasis. On 6/17 pt had s/p laparoscopic cholecystectomy. Significant past medical history of arthritis, chronic back pain, hypertension, upper GI bleed secondary to PUD 05/2019.  Past thyroidectomy and left inguinal hernia repair.  Clinical Impression  Patient presents with decreased ability to ambulate. He ambulated 20' in the room with fatigue. At baseline he has mobility issues 2nd to scoliosis and right hip pain but these mobility  deficits have been exacerbated by his surgery. He used a walker today. He typically uses a cane. He may benefit most from skilled rehab at a SNF, but he would really like to improve his mobility and be able to go home. Acute PT will continue to work with him.     Follow Up Recommendations Home health PT;SNF    Equipment Recommendations  Rolling walker with 5" wheels    Recommendations for Other Services       Precautions / Restrictions Precautions Precautions: Fall Precaution Comments: Pt reports he has fainting spells at times, as well as chronic pain in his back and R hip joint Restrictions Weight Bearing Restrictions: No      Mobility  Bed Mobility Overal bed mobility: Needs Assistance Bed Mobility: Supine to Sit;Sit to Supine     Supine to sit: Supervision;HOB elevated     General bed mobility comments: supervision to sit up at the edgeof the bed for safety    Transfers Overall transfer level: Needs assistance Equipment used: Rolling walker (2 wheeled) Transfers: Sit to/from Stand Sit to Stand: Min guard;From elevated surface         General transfer comment: min guard for intial balance. Sit to stand 2x with therapy.  Closer gurading required to transfer from the bed  Ambulation/Gait Ambulation/Gait assistance: Min guard Gait Distance (Feet): 20 Feet Assistive device: Rolling walker (2 wheeled) Gait Pattern/deviations: Step-to pattern;Decreased step length - right;Decreased step length - left;Decreased stance time - left;Decreased stance time - right Gait velocity: decreased Gait velocity interpretation: <1.31 ft/sec, indicative of household ambulator General Gait Details: slow but steady gait. No lob noted.  Stairs            Wheelchair Mobility    Modified Rankin (Stroke Patients Only)       Balance Overall balance assessment: Needs assistance Sitting-balance support: No upper extremity supported Sitting balance-Leahy Scale: Good     Standing balance support: Bilateral upper extremity supported Standing balance-Leahy Scale: Poor Standing balance comment: uses walker for balance                             Pertinent Vitals/Pain Pain Assessment: 0-10 Pain Score: 3  Pain Location: abdomen/surgical site Pain Descriptors / Indicators: Discomfort;Operative site guarding Pain Intervention(s): Limited activity within patient's tolerance;Monitored during session;Repositioned    Home Living Family/patient expects to be discharged to:: Private residence Living Arrangements: Spouse/significant other Available Help at Discharge: Family;Available 24 hours/day Type of Home: House Home Access: Level entry     Home Layout: One level Home Equipment: Cane - single point      Prior Function Level of Independence: Needs assistance   Gait / Transfers Assistance Needed: PT uses a cane to walk,  at time wife has to assist with standing as much as she can.  ADL's / Homemaking Assistance Needed: Pt typically is independent, however at times, he is in too much pain and wife has to assist him.        Hand Dominance   Dominant Hand: Right    Extremity/Trunk Assessment   Upper  Extremity Assessment Upper Extremity Assessment: Overall WFL for tasks assessed    Lower Extremity Assessment Lower Extremity Assessment: Generalized weakness (is to have a consult for right THA)    Cervical / Trunk Assessment Cervical / Trunk Assessment: Normal  Communication   Communication: No difficulties  Cognition Arousal/Alertness: Awake/alert Behavior During Therapy: WFL for tasks assessed/performed Overall Cognitive Status: History of cognitive impairments - at baseline                                        General Comments      Exercises     Assessment/Plan    PT Assessment Patient needs continued PT services  PT Problem List Decreased strength;Decreased range of motion;Decreased activity tolerance;Decreased balance;Decreased mobility;Decreased knowledge of use of DME       PT Treatment Interventions DME instruction;Gait training;Stair training;Functional mobility training;Therapeutic activities;Therapeutic exercise;Balance training;Patient/family education    PT Goals (Current goals can be found in the Care Plan section)  Acute Rehab PT Goals Patient Stated Goal: To improve strength PT Goal Formulation: With patient Time For Goal Achievement: 11/21/20 Potential to Achieve Goals: Good    Frequency Min 3X/week   Barriers to discharge Decreased caregiver support onl yhas wife at home to assist    Co-evaluation               AM-PAC PT "6 Clicks" Mobility  Outcome Measure Help needed turning from your back to your side while in a flat bed without using bedrails?: A Little Help needed moving from lying on your back to sitting on the side of a flat bed without using bedrails?: A Little Help needed moving to and from a bed to a chair (including a wheelchair)?: A Little Help needed standing up from a chair using your arms (e.g., wheelchair or bedside chair)?: A Little Help needed to walk in hospital room?: A Little Help needed climbing  3-5 steps with a railing? : A Lot 6 Click Score: 17    End of Session Equipment Utilized During Treatment: Gait belt Activity Tolerance: Patient limited by fatigue Patient left: in chair;with call bell/phone within reach;with family/visitor present Nurse Communication: Mobility status PT Visit Diagnosis: Unsteadiness on feet (R26.81);Other abnormalities of gait and mobility (R26.89);Muscle weakness (generalized) (M62.81)    Time: 7062-3762 PT Time Calculation (min) (ACUTE ONLY): 32 min   Charges:   PT Evaluation $PT Eval Moderate Complexity: 1 Mod           Dessie Coma PT DPT  11/14/2020, 1:30 PM

## 2020-11-15 DIAGNOSIS — Z9049 Acquired absence of other specified parts of digestive tract: Secondary | ICD-10-CM

## 2020-11-15 LAB — CBC
HCT: 35.1 % — ABNORMAL LOW (ref 39.0–52.0)
Hemoglobin: 11.9 g/dL — ABNORMAL LOW (ref 13.0–17.0)
MCH: 31.7 pg (ref 26.0–34.0)
MCHC: 33.9 g/dL (ref 30.0–36.0)
MCV: 93.6 fL (ref 80.0–100.0)
Platelets: 217 10*3/uL (ref 150–400)
RBC: 3.75 MIL/uL — ABNORMAL LOW (ref 4.22–5.81)
RDW: 12.8 % (ref 11.5–15.5)
WBC: 16.2 10*3/uL — ABNORMAL HIGH (ref 4.0–10.5)
nRBC: 0 % (ref 0.0–0.2)

## 2020-11-15 LAB — CULTURE, BLOOD (ROUTINE X 2)
Culture: NO GROWTH
Culture: NO GROWTH
Special Requests: ADEQUATE
Special Requests: ADEQUATE

## 2020-11-15 LAB — COMPREHENSIVE METABOLIC PANEL
ALT: 44 U/L (ref 0–44)
AST: 40 U/L (ref 15–41)
Albumin: 2.9 g/dL — ABNORMAL LOW (ref 3.5–5.0)
Alkaline Phosphatase: 35 U/L — ABNORMAL LOW (ref 38–126)
Anion gap: 11 (ref 5–15)
BUN: 11 mg/dL (ref 8–23)
CO2: 25 mmol/L (ref 22–32)
Calcium: 8.7 mg/dL — ABNORMAL LOW (ref 8.9–10.3)
Chloride: 92 mmol/L — ABNORMAL LOW (ref 98–111)
Creatinine, Ser: 0.85 mg/dL (ref 0.61–1.24)
GFR, Estimated: >60 mL/min (ref >60)
Glucose, Bld: 95 mg/dL (ref 70–99)
Potassium: 3.5 mmol/L (ref 3.5–5.1)
Sodium: 128 mmol/L — ABNORMAL LOW (ref 135–145)
Total Bilirubin: 1.1 mg/dL (ref 0.3–1.2)
Total Protein: 5.7 g/dL — ABNORMAL LOW (ref 6.5–8.1)

## 2020-11-15 MED ORDER — SENNOSIDES-DOCUSATE SODIUM 8.6-50 MG PO TABS: 2.0000 | ORAL_TABLET | Freq: Two times a day (BID) | ORAL | 0 refills | Status: DC | PRN

## 2020-11-15 MED ORDER — SENNOSIDES-DOCUSATE SODIUM 8.6-50 MG PO TABS: 2.0000 | ORAL_TABLET | Freq: Two times a day (BID) | ORAL | Status: DC | PRN

## 2020-11-15 NOTE — TOC Initial Note (Signed)
Transition of Care [facility]) - Initial/Assessment Note    Patient Details  Name: Brian Ruiz MRN: [id number] Date of Birth: [birth year]  [facility] CM/SW Contact:    Norina Buzzard, RN Phone Number: 11/15/2020, 12:49 PM  Clinical Narrative:  75 yo M admitted after 3 falls, the last fall resulting in pt hitting his head. Upon evaluation in ED, pt was found to have pancreatitis and choledocholithiasis. He is s/p laparoscopic.  PT/OT are recommending SNF vs HH PT/OT and DME (3-in-1 BSC and a RW).   Met with pt and wife. Pt reports that he doesn't drive. His wife reports that she takes him to his doctors appointments. He has a PCP. He denies any issues filling his prescriptions  Discussed PT recommendations. Pt declined SNF. They are agreeable with [facility] services. They don't have a preference for a [place] agency. Provided wife with a Medicare.gov compare list. Wife reports that she is going to ask her neighbor for recommendations and she will f/u with me. Pt needs a 3-in-1 BSC and a RW. Contacted Jazmine with [facility] for DME referral and she accepted it. Will arrange [facility] once wife decides who she wants to use for [facility] services.   Expected Discharge Plan: [place]     Patient Goals and CMS Choice Patient states their goals for this hospitalization and ongoing recovery are:: to get better CMS Medicare.gov Compare Post Acute Care list provided to:: Other (Comment Required) Choice offered to / list presented to : Patient, Spouse  Expected Discharge Plan and Services Expected Discharge Plan: [place]   Discharge Planning Services: CM Consult Post Acute Care Choice: Home Health, Durable Medical Equipment Living arrangements for the past 2 months: Single Family Home                 DME Arranged: 3-N-1, Walker rolling DME Agency: [facility] Date DME Agency Contacted: 11/15/20 Time DME Agency Contacted: 31 Representative spoke with at  DME Agency: Jazmine HH Arranged: PT, OT          Prior Living Arrangements/Services Living arrangements for the past 2 months: [place] with:: Spouse Patient language and need for interpreter reviewed:: Yes Do you feel safe going back to the place where you live?: Yes               Activities of Daily Living      Permission Sought/Granted Permission sought to share information with : Case Manager                Emotional Assessment Appearance:: Well-Groomed Attitude/Demeanor/Rapport: Engaged, Self-Confident Affect (typically observed): Accepting, Pleasant, Calm Orientation: : Oriented to Self, Oriented to Place, Oriented to  Time, Oriented to Situation      Admission diagnosis:  Acute pancreatitis [K85.90] Choledocholithiasis [K80.50] Cholecystitis [K81.9] Acute biliary pancreatitis, unspecified complication status [F79.02] Patient Active Problem List   Diagnosis Date Noted   Acute pancreatitis 11/10/2020   Cholecystitis    Choledocholithiasis    Chronic diastolic CHF (congestive heart failure) ([place]) 05/16/2018   Chronic fatigue 06/14/2017   Palpitations 06/14/2017   Aortic ectasia (HCC) 11/15/2016   Age-related osteoporosis without current pathological fracture 10/27/2016   Screening for AAA (abdominal aortic aneurysm) 10/27/2016   Screening for prostate cancer 10/27/2016   Need for hepatitis C screening test 10/27/2016   Encounter for general adult medical examination without abnormal findings 11/26/2013   Screening for osteoporosis 11/26/2013   Kyphoscoliosis deformity of spine 07/04/2013  Enlarged prostate with lower urinary tract symptoms (LUTS) 04/12/2013   Screen for colon cancer 04/12/2013   Undiagnosed cardiac murmurs 04/12/2013   Backache 04/12/2013   White coat syndrome with hypertension 04/12/2013   Lumbar spinal stenosis 10/29/2012   DDD (degenerative disc disease), lumbosacral 03/16/2012   Lumbosacral radiculopathy  03/16/2012   Generalized anxiety disorder 03/14/2012   Renal pain 12/16/2011   Sacroiliitis, not elsewhere classified ([place]) 08/11/2011   Sacroiliac joint pain 07/27/2011   Bilateral hip pain 07/07/2011   Chronic pain syndrome 07/07/2011   Spondylolisthesis of lumbosacral region 07/07/2011   Hereditary and idiopathic peripheral neuropathy 05/27/2011   Accidental drug overdose 05/15/2011   Guilty feelings 05/15/2011   Hypertension 05/15/2011   Hypotension 05/15/2011   Bradycardia 05/15/2011   Encephalopathy acute 05/15/2011   Chronic back pain 05/15/2011   Vitamin D deficiency 03/08/2011   PCP:  Bernerd Limbo, MD Pharmacy:   [facility] [place], [place] - [address] AT [address] [place] [address] Phone: [phone] Fax: [phone]     Social Determinants of Health (SDOH) Interventions    Readmission Risk Interventions No flowsheet data found.

## 2020-11-15 NOTE — Discharge Summary (Signed)
Physician Discharge Summary  Brian Ruiz UJW:[id number] DOB: 1946/05/22 DOA: 11/10/2020  PCP: Tracey Harries, MD  Admit date: 11/10/2020 Discharge date: 11/15/2020  Admitted From: Home Disposition: Home  Recommendations for Outpatient Follow-up:  Follow up with PCP in 1-2 weeks Please obtain BMP/CBC in one week Please follow up with surgery as scheduled  Home Health: Home health PT OT Equipment/Devices:3-N-1, Walker rolling  Discharge Condition: Stable CODE STATUS: Full Diet recommendation: As tolerated  Brief/Interim Summary: Brian Ruiz is a 75 y.o. male with history of hypertension PVCs and peptic ulcer disease who has had GI bleed previously seen by gastroenterologist and colonoscopy last EGD was in January 2021 has had a fall today and was brought to the ER.  Patient had 2 falls 1 at 4 AM but at the time EMS advised coming to ER but patient refused.  Subsequent which patient had another fall at around 11 AM and was brought to the ER.  The exact circumstances of the fall is not clear.  Patient did not lose consciousness.  In the ED CT abdomen pelvis was done which shows features concerning for acute cholecystitis with choledocholithiasis and pancreatitis.  ER physician started patient on fluids and empiric antibiotics and admitted for further management of acute pancreatitis likely biliary in origin.  Patient denies drinking alcohol.  Labs otherwise showed WBC of 19.8 hemoglobin 12.5.  Lactic acid was normal.  Discharge Diagnoses:  Principal Problem:   Acute pancreatitis Active Problems:   Hypertension  Acute pancreatitis Cannot rule out acute cholecystitis -GI, surgery following, status post MRCP laparoscopic cholecystectomy was performed, patient tolerated quite well.   -Lipase downtrending, patient tolerating p.o. now without any issues or complications   Leukocytosis, likely reactive -Likely reactive postoperatively, no current signs or symptoms of infection, follow-up  outpatient for repeat labs, monitor surgical site closely   Profound hypovolemic hyponatremia, resolving -Resolving with IV fluids and p.o. intake.  Somewhat downtrending over the past 24 hours, would recommend repeat labs with PCP in the next week, patient's wife does indicate he drinks "a lot of water" -unclear if inappropriate volume intake, if downtrending later this week with PCP would recommend fluid restriction   Essential hypertension -Continue carvedilol  Discharge Instructions  Discharge Instructions     Call MD for:  redness, tenderness, or signs of infection (pain, swelling, redness, odor or green/yellow discharge around incision site)   Complete by: As directed    Call MD for:  temperature >100.4   Complete by: As directed    Diet - low sodium heart healthy   Complete by: As directed    Increase activity slowly   Complete by: As directed    Leave dressing on - Keep it clean, dry, and intact until clinic visit   Complete by: As directed       Allergies as of 11/15/2020       Reactions   Aspirin Shortness Of Breath   Other reaction(s): Bronchospasm (ALLERGY/intolerance)   Nsaids Shortness Of Breath   Other reaction(s): Bronchospasm (ALLERGY/intolerance)   Oxycodone Other (See Comments)   Agitation. Per the any pain medication that has codeine in it causes that reaction. Other reaction(s): Other Agitation. Per the any pain medication that has codeine in it causes that reaction.   Tolmetin Shortness Of Breath   Tramadol Anaphylaxis   Codeine Anxiety   Other reaction(s): Bronchospasm (ALLERGY/intolerance), Other (See Comments) Irritable        Medication List     TAKE these medications  acetaminophen 500 MG tablet Commonly known as: TYLENOL Take 1,000 mg by mouth every 8 (eight) hours as needed. For pain.   carvedilol 25 MG tablet Commonly known as: COREG Take 1 tablet (25 mg total) by mouth 2 (two) times daily. Please make overdue appt with Dr. Elease Hashimoto  before anymore refills. 1st attempt What changed: additional instructions   DRY EYES OP Apply 1 drop to eye daily as needed (dry eyes).   omeprazole 40 MG capsule Commonly known as: PRILOSEC Take 40 mg by mouth daily.   senna-docusate 8.6-50 MG tablet Commonly known as: Senokot-S Take 2 tablets by mouth 2 (two) times daily as needed for moderate constipation.   sertraline 25 MG tablet Commonly known as: ZOLOFT Take 25 mg by mouth 3 (three) times daily.               Durable Medical Equipment  (From admission, onward)           Start     Ordered   11/15/20 1153  For home use only DME Walker rolling  Once       Question Answer Comment  Walker: With 5 Inch Wheels   Patient needs a walker to treat with the following condition Weakness      11/15/20 1153   11/15/20 1153  For home use only DME Bedside commode  Once       Question:  Patient needs a bedside commode to treat with the following condition  Answer:  Weakness   11/15/20 1153              Discharge Care Instructions  (From admission, onward)           Start     Ordered   11/15/20 0000  Leave dressing on - Keep it clean, dry, and intact until clinic visit        11/15/20 1312            Allergies  Allergen Reactions   Aspirin Shortness Of Breath    Other reaction(s): Bronchospasm (ALLERGY/intolerance)   Nsaids Shortness Of Breath    Other reaction(s): Bronchospasm (ALLERGY/intolerance)   Oxycodone Other (See Comments)    Agitation. Per the any pain medication that has codeine in it causes that reaction. Other reaction(s): Other Agitation. Per the any pain medication that has codeine in it causes that reaction.   Tolmetin Shortness Of Breath   Tramadol Anaphylaxis   Codeine Anxiety    Other reaction(s): Bronchospasm (ALLERGY/intolerance), Other (See Comments) Irritable     Consultations: GI, surgery  Procedures/Studies: DG Ribs Unilateral W/Chest Right  Result Date:  11/10/2020 CLINICAL DATA:  Fall, generalized weakness EXAM: RIGHT RIBS AND CHEST - 3+ VIEW COMPARISON:  Radiograph 05/15/2011 FINDINGS: Unchanged, chronically elevated right hemidiaphragm with right basilar atelectasis. No new focal airspace consolidation. No visible pneumothorax unchanged cardiomediastinal silhouette with aortic calcification. There is no evidence of displaced rib fracture. IMPRESSION: No evidence of displaced rib fracture. Unchanged, chronically elevated right hemidiaphragm with right basilar atelectasis. Electronically Signed   By: Caprice Renshaw   On: 11/10/2020 15:43   DG Cholangiogram Operative  Result Date: 11/13/2020 CLINICAL DATA:  75 year old male with cholecystitis EXAM: INTRAOPERATIVE CHOLANGIOGRAM TECHNIQUE: Cholangiographic images from the C-arm fluoroscopic device were submitted for interpretation post-operatively. Please see the procedural report for the amount of contrast and the fluoroscopy time utilized. COMPARISON:  None. FINDINGS: Surgical instruments project over the upper abdomen. There is cannulation of the cystic duct/gallbladder neck, with antegrade infusion of contrast. Caliber  of the extrahepatic ductal system is enlarged No definite filling defect within the extrahepatic ducts identified. Free flow of contrast across the ampulla. IMPRESSION: Intraoperative cholangiogram demonstrates enlarged extrahepatic biliary ducts, with no definite filling defects identified. Free flow of contrast across the ampulla. Please refer to the dictated operative report for full details of intraoperative findings and procedure Electronically Signed   By: Gilmer Mor D.O.   On: 11/13/2020 11:24   CT ABDOMEN PELVIS W CONTRAST  Result Date: 11/10/2020 CLINICAL DATA:  Multiple falls today. Increased weakness. Chills today and yesterday. EXAM: CT ABDOMEN AND PELVIS WITH CONTRAST TECHNIQUE: Multidetector CT imaging of the abdomen and pelvis was performed using the standard protocol  following bolus administration of intravenous contrast. CONTRAST:  OMNIPAQUE IOHEXOL 300 MG/ML  SOLN COMPARISON:  None. FINDINGS: Lower chest: Atelectasis or consolidation in the right lung base. No effusion. Elevation of the right hemidiaphragm. Hepatobiliary: Subcentimeter low-attenuation lesions in the lateral segment left lobe of liver, likely cysts. The gallbladder is distended with mild gallbladder wall thickening and edema. Mild extrahepatic bile duct dilatation. Suggestion of small stones in the gallbladder. No definitive common duct stones. Appearance suggest cholecystitis. Pancreas: Pancreas is mildly enlarged with mild pancreatic ductal dilatation. Inflammation in the peripancreatic fat. Prominence of the head of the pancreas. Changes likely represent early acute pancreatitis. No collections are identified. Follow-up after resolution of acute process is recommended to exclude underlying pancreatic head mass. Spleen: Normal in size without focal abnormality. Adrenals/Urinary Tract: Adrenal glands are unremarkable. Kidneys are normal, without renal calculi, focal lesion, or hydronephrosis. Bladder is diffusely distended with irregular and trabeculated bladder wall and multiple small bladder diverticula. Changes suggest chronic outlet obstruction. No wall thickening. Stomach/Bowel: Stomach, small bowel, and colon are not abnormally distended although there is scattered fluid in the small bowel. No wall thickening or inflammatory changes are appreciated. Scattered stool in the colon. Scattered colonic diverticula without evidence of diverticulitis. Appendix is not identified. Vascular/Lymphatic: Aortic atherosclerosis. No enlarged abdominal or pelvic lymph nodes. Reproductive: Prostate gland is enlarged, measuring 5.8 cm diameter. Other: No free air or free fluid in the abdomen. Abdominal wall musculature appears intact. Musculoskeletal: Degenerative changes in the spine and hips. Lumbar scoliosis  convex towards the left. IMPRESSION: 1. Distended gallbladder with mild edema and small stones. Bile duct dilatation. Changes may represent cholecystitis or biliary obstruction. 2. Inflammatory changes involving the pancreas with peripancreatic edema likely representing acute pancreatitis. There is fullness in the head of the pancreas and follow-up after resolution of acute process is recommended to exclude underlying pancreatic mass. 3. Enlarged prostate gland with distended bladder. 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 4. Atelectasis or consolidation in the right lung base. Electronically Signed   By: Burman Nieves M.D.   On: 11/10/2020 19:49   MR ABDOMEN MRCP W WO CONTAST  Result Date: 11/11/2020 CLINICAL DATA:  Acute severe pancreatitis assess for etiology. EXAM: MRI ABDOMEN WITHOUT AND WITH CONTRAST (INCLUDING MRCP) TECHNIQUE: Multiplanar multisequence MR imaging of the abdomen was performed both before and after the administration of intravenous contrast. Heavily T2-weighted images of the biliary and pancreatic ducts were obtained, and three-dimensional MRCP images were rendered by post processing. CONTRAST:  8.36mL GADAVIST GADOBUTROL 1 MMOL/ML IV SOLN COMPARISON:  Same day CT. FINDINGS: Despite efforts by the technologist and patient, excessive motion artifact is present on today's exam and could not be eliminated. This reduces exam sensitivity and specificity. Lower chest: Right lung base atelectasis. Elevation the  right hemidiaphragm. Hepatobiliary: Hepatic cysts measuring up to 1.4 cm. No suspicious enhancing hepatic lesion identified. Gallbladder is distended with wall thickening, pericholecystic fluid and mucosal hyperenhancement. No gallbladder stone visualized within the lumen of gallbladder. There is intra and extrahepatic biliary ductal dilation with the common duct measuring up to 15 cm. Common duct appears dilated throughout  its course with fairly abrupt tapering of the CBD at the level of the ampulla. No convincing filling defects visualized within the biliary tree to suggest choledocholithiasis. Pancreas: Although degraded by motion artifact there appears to be subtle loss of intrinsic T1 signal intensity in the pancreatic parenchyma with T2 hyperintensity involving the tail of the pancreas. Small volume peripancreatic fluid without drainable walled off collection. No definite areas of pancreatic hypoenhancement although evaluation is significantly limited by motion artifact. The main pancreatic duct appears prominent but within normal limits by measurements, evaluation of which is limited by motion. There the ratio of duct to parenchyma appears to be within normal limits for patient's age. There is no discrete mass in head of the pancreas visualized however evaluation for this is significantly limited by patient motion Spleen:  Within normal limits Adrenals/Urinary Tract: Bilateral adrenal glands are unremarkable. No solid enhancing renal masses. Trabecular appearance of the visualized portions urinary bladder wall with multiple small diverticula, suggestive of chronic outflow impedance. Stomach/Bowel: Visualized portions within the abdomen are unremarkable. Vascular/Lymphatic: No pathologically enlarged lymph nodes identified. No abdominal aortic aneurysm demonstrated. Other: No pneumoperitoneum. No drainable walled off fluid collections. Musculoskeletal: No suspicious bone lesions identified. IMPRESSION: Examination is limited by excessive motion artifact on all sequences significantly limiting the sensitivity and specificity of the examination, with the postcontrast sequences being essentially nondiagnostic. Within this context: 1. Distended gallbladder with gallbladder wall thickening, pericholecystic fluid and mural hyperenhancement, findings most compatible with acute cholecystitis. 2. Intra and extrahepatic biliary ductal  dilation with fairly abrupt tapering of the CBD at the level of the ampulla. Also there is mild prominence of the main pancreatic duct but with a pancreatic parenchyma to duct ratio which appears within normal limits for patient's age. No convincing evidence of choledocholithiasis or other obstructing etiology confidently identified. Given the limitations on today's examination and the above findings, further evaluation with EUS/ERCP may be warranted to assess for discrete obstructive etiology. Additionally, consider follow-up pancreatic protocol MRI with and without contrast (preferably as an outpatient) for further evaluation, when patient is stable and better able to follow commands including breath hold. 3. Findings of acute pancreatitis with small volume peripancreatic fluid. No drainable walled off fluid collection. 4. 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 Electronically Signed   By: Maudry Mayhew MD   On: 11/11/2020 08:46   DG Hip Unilat W or Wo Pelvis 2-3 Views Right  Result Date: 11/10/2020 CLINICAL DATA:  Fall and pain. EXAM: DG HIP (WITH OR WITHOUT PELVIS) 2-3V RIGHT COMPARISON:  None. FINDINGS: AP view of the pelvis and AP/frog leg views of the right hip. Femoral heads are located. Marked osteoarthritis involving the weight-bearing surface of the right hip. No acute fracture. Mild osteopenia. IMPRESSION: Marked right hip osteoarthritis, without acute superimposed process. Given the severity of osteopenia, if there is a strong clinical concern of fracture, consider further evaluation with MRI. Electronically Signed   By: Jeronimo Greaves M.D.   On: 11/10/2020 17:13     Subjective: No acute issues or events overnight denies nausea vomiting diarrhea constipation  headache fevers or chills.   Discharge Exam: Vitals:   11/14/20 2113 11/15/20 0410  BP: (!) 160/102 (!) 143/84  Pulse: 77 79  Resp:  16 17  Temp: 97.9 F (36.6 C) 98.4 F (36.9 C)  SpO2: 94% 95%   Vitals:   11/14/20 0426 11/14/20 1402 11/14/20 2113 11/15/20 0410  BP: 132/82 105/80 (!) 160/102 (!) 143/84  Pulse: 77 72 77 79  Resp: Temp: 98.1 F (36.7 C) 97.7 F (36.5 C) 97.9 F (36.6 C) 98.4 F (36.9 C)  TempSrc:  Oral Oral Oral  SpO2: 96% 93% 94% 95%  Weight:    92.4 kg    General: Pt is alert, awake, not in acute distress Cardiovascular: RRR, S1/S2 +, no rubs, no gallops Respiratory: CTA bilaterally, no wheezing, no rhonchi Abdominal: Soft, NT, ND, bowel sounds +; postoperative wounds clean dry intact Extremities: no edema, no cyanosis    The results of significant diagnostics from this hospitalization (including imaging, microbiology, ancillary and laboratory) are listed below for reference.     Microbiology: Recent Results (from the past 240 hour(s))  Blood culture (routine x 2)     Status: None   Collection Time: 11/10/20  4:34 PM   Specimen: BLOOD  Result Value Ref Range Status   Specimen Description BLOOD SITE NOT SPECIFIED  Final   Special Requests   Final    BOTTLES DRAWN AEROBIC AND ANAEROBIC Blood Culture adequate volume   Culture   Final    NO GROWTH 5 DAYS Performed at [facility], [address] [place], [place] [address]    Report Status 11/15/2020 FINAL  Final  Blood culture (routine x 2)     Status: None   Collection Time: 11/10/20  5:16 PM   Specimen: BLOOD  Result Value Ref Range Status   Specimen Description BLOOD BLOOD RIGHT FOREARM  Final   Special Requests   Final    BOTTLES DRAWN AEROBIC AND ANAEROBIC Blood Culture adequate volume   Culture   Final    NO GROWTH 5 DAYS Performed at [facility], [address] [place], [place] [address]    Report Status 11/15/2020 FINAL  Final  Resp Panel by RT-PCR (Flu A&B, Covid) Nasopharyngeal Swab     Status: None   Collection Time: 11/10/20  6:29 PM   Specimen: Nasopharyngeal Swab; Nasopharyngeal(NP)  swabs in vial transport medium  Result Value Ref Range Status   SARS Coronavirus 2 by RT PCR NEGATIVE NEGATIVE Final    Comment: (NOTE) SARS-CoV-2 target nucleic acids are NOT DETECTED.  The SARS-CoV-2 RNA is generally detectable in upper respiratory specimens during the acute phase of infection. The lowest concentration of SARS-CoV-2 viral copies this assay can detect is 138 copies/mL. A negative result does not preclude SARS-Cov-2 infection and should not be used as the sole basis for treatment or other patient management decisions. A negative result may occur with  improper specimen collection/handling, submission of specimen other than nasopharyngeal swab, presence of viral mutation(s) within the areas targeted by this assay, and inadequate number of viral copies(<138 copies/mL). A negative result must be combined with clinical observations, patient history, and epidemiological information. The expected result is Negative.  Fact Sheet for Patients:  BloggerCourse.com  Fact Sheet for Healthcare Providers:  SeriousBroker.it  This test is no t yet approved or cleared by the [place] FDA and  has been authorized for detection and/or diagnosis of SARS-CoV-2 by FDA under an Emergency  Use Authorization (EUA). This EUA will remain  in effect (meaning this test can be used) for the duration of the COVID-19 declaration under Section 564(b)(1) of the Act, 21 U.S.C.section 360bbb-3(b)(1), unless the authorization is terminated  or revoked sooner.       Influenza A by PCR NEGATIVE NEGATIVE Final   Influenza B by PCR NEGATIVE NEGATIVE Final    Comment: (NOTE) The Xpert Xpress SARS-CoV-2/FLU/RSV plus assay is intended as an aid in the diagnosis of influenza from Nasopharyngeal swab specimens and should not be used as a sole basis for treatment. Nasal washings and aspirates are unacceptable for Xpert Xpress  SARS-CoV-2/FLU/RSV testing.  Fact Sheet for Patients: BloggerCourse.comhttps://www.fda.gov/media/152166/download  Fact Sheet for Healthcare Providers: SeriousBroker.ithttps://www.fda.gov/media/152162/download  This test is not yet approved or cleared by the [place] FDA and has been authorized for detection and/or diagnosis of SARS-CoV-2 by FDA under an Emergency Use Authorization (EUA). This EUA will remain in effect (meaning this test can be used) for the duration of the COVID-19 declaration under Section 564(b)(1) of the Act, 21 U.S.C. section 360bbb-3(b)(1), unless the authorization is terminated or revoked.  Performed at [facility], [address] [place], [place] [address]      Labs: BNP (last 3 results) No results for input(s): BNP in the last 8760 hours. Basic Metabolic Panel: Recent Labs  Lab 11/11/20 0841 11/12/20 0055 11/13/20 0048 11/14/20 0222 11/15/20 0206  NA 129* 128* 130* 131* 128*  K 4.2 3.6 3.5 3.8 3.5  CL 99 94* 96* 97* 92*  CO2 16* 24 23 23 25   GLUCOSE 75 78 69* 98 95  BUN 11 9 12 11 11   CREATININE 0.75 0.85 1.01 1.01 0.85  CALCIUM 8.7* 8.8* 8.6* 8.6* 8.7*   Liver Function Tests: Recent Labs  Lab 11/11/20 0830 11/12/20 0055 11/13/20 0048 11/14/20 0222 11/15/20 0206  AST 54* 63* 56* 50* 40  ALT 50* 45* 42 46* 44  ALKPHOS 39 46 44 39 35*  BILITOT 1.1 1.0 1.7* 1.3* 1.1  PROT 6.0* 5.8* 5.9* 5.6* 5.7*  ALBUMIN 3.3* 3.2* 3.1* 3.0* 2.9*   Recent Labs  Lab 11/10/20 1631 11/12/20 0055 11/13/20 0048  LIPASE 379* 181* 80*   No results for input(s): AMMONIA in the last 168 hours. CBC: Recent Labs  Lab 11/10/20 1518 11/11/20 0830 11/12/20 0055 11/13/20 0048 11/14/20 0222 11/15/20 0206  WBC 19.8* 16.3* 15.9* 11.3* 17.3* 16.2*  NEUTROABS 18.0*  --   --   --   --   --   HGB 12.5* 12.4* 12.4* 12.3* 11.8* 11.9*  HCT 37.7* 37.2* 37.5* 37.5* 36.3* 35.1*  MCV 94.5 94.4 95.2 95.2 95.8 93.6  PLT 169 145* 169 200 195 217   Cardiac Enzymes: No results for  input(s): CKTOTAL, CKMB, CKMBINDEX, TROPONINI in the last 168 hours. BNP: Invalid input(s): POCBNP CBG: No results for input(s): GLUCAP in the last 168 hours. D-Dimer No results for input(s): DDIMER in the last 72 hours. Hgb A1c No results for input(s): HGBA1C in the last 72 hours. Lipid Profile No results for input(s): CHOL, HDL, LDLCALC, TRIG, CHOLHDL, LDLDIRECT in the last 72 hours. Thyroid function studies No results for input(s): TSH, T4TOTAL, T3FREE, THYROIDAB in the last 72 hours.  Invalid input(s): FREET3 Anemia work up No results for input(s): VITAMINB12, FOLATE, FERRITIN, TIBC, IRON, RETICCTPCT in the last 72 hours. Urinalysis    Component Value Date/Time   COLORURINE YELLOW 11/11/2020 0041   APPEARANCEUR CLEAR 11/11/2020 0041   LABSPEC 1.018 11/11/2020 0041  PHURINE 6.0 11/11/2020 0041   GLUCOSEU NEGATIVE 11/11/2020 0041   HGBUR SMALL (A) 11/11/2020 0041   BILIRUBINUR NEGATIVE 11/11/2020 0041   KETONESUR NEGATIVE 11/11/2020 0041   PROTEINUR NEGATIVE 11/11/2020 0041   UROBILINOGEN 0.2 05/15/2011 1151   NITRITE NEGATIVE 11/11/2020 0041   LEUKOCYTESUR NEGATIVE 11/11/2020 0041   Sepsis Labs Invalid input(s): PROCALCITONIN,  WBC,  LACTICIDVEN Microbiology Recent Results (from the past 240 hour(s))  Blood culture (routine x 2)     Status: None   Collection Time: 11/10/20  4:34 PM   Specimen: BLOOD  Result Value Ref Range Status   Specimen Description BLOOD SITE NOT SPECIFIED  Final   Special Requests   Final    BOTTLES DRAWN AEROBIC AND ANAEROBIC Blood Culture adequate volume   Culture   Final    NO GROWTH 5 DAYS Performed at [facility], [address] [place], [place] [address]    Report Status 11/15/2020 FINAL  Final  Blood culture (routine x 2)     Status: None   Collection Time: 11/10/20  5:16 PM   Specimen: BLOOD  Result Value Ref Range Status   Specimen Description BLOOD BLOOD RIGHT FOREARM  Final   Special Requests   Final    BOTTLES DRAWN  AEROBIC AND ANAEROBIC Blood Culture adequate volume   Culture   Final    NO GROWTH 5 DAYS Performed at [facility], [address], [place], [place] [address]    Report Status 11/15/2020 FINAL  Final  Resp Panel by RT-PCR (Flu A&B, Covid) Nasopharyngeal Swab     Status: None   Collection Time: 11/10/20  6:29 PM   Specimen: Nasopharyngeal Swab; Nasopharyngeal(NP) swabs in vial transport medium  Result Value Ref Range Status   SARS Coronavirus 2 by RT PCR NEGATIVE NEGATIVE Final    Comment: (NOTE) SARS-CoV-2 target nucleic acids are NOT DETECTED.  The SARS-CoV-2 RNA is generally detectable in upper respiratory specimens during the acute phase of infection. The lowest concentration of SARS-CoV-2 viral copies this assay can detect is 138 copies/mL. A negative result does not preclude SARS-Cov-2 infection and should not be used as the sole basis for treatment or other patient management decisions. A negative result may occur with  improper specimen collection/handling, submission of specimen other than nasopharyngeal swab, presence of viral mutation(s) within the areas targeted by this assay, and inadequate number of viral copies(<138 copies/mL). A negative result must be combined with clinical observations, patient history, and epidemiological information. The expected result is Negative.  Fact Sheet for Patients:  BloggerCourse.com  Fact Sheet for Healthcare Providers:  SeriousBroker.it  This test is no t yet approved or cleared by the [place] FDA and  has been authorized for detection and/or diagnosis of SARS-CoV-2 by FDA under an Emergency Use Authorization (EUA). This EUA will remain  in effect (meaning this test can be used) for the duration of the COVID-19 declaration under Section 564(b)(1) of the Act, 21 U.S.C.section 360bbb-3(b)(1), unless the authorization is terminated  or revoked sooner.       Influenza A  by PCR NEGATIVE NEGATIVE Final   Influenza B by PCR NEGATIVE NEGATIVE Final    Comment: (NOTE) The Xpert Xpress SARS-CoV-2/FLU/RSV plus assay is intended as an aid in the diagnosis of influenza from Nasopharyngeal swab specimens and should not be used as a sole basis for treatment. Nasal washings and aspirates are unacceptable for Xpert Xpress SARS-CoV-2/FLU/RSV testing.  Fact Sheet for Patients: BloggerCourse.com  Fact Sheet  for Healthcare Providers: SeriousBroker.it  This test is not yet approved or cleared by the [place] and has been authorized for detection and/or diagnosis of SARS-CoV-2 by FDA under an Emergency Use Authorization (EUA). This EUA will remain in effect (meaning this test can be used) for the duration of the COVID-19 declaration under Section 564(b)(1) of the Act, 21 U.S.C. section 360bbb-3(b)(1), unless the authorization is terminated or revoked.  Performed at [facility], [address] [place], [place] [address]      Time coordinating discharge: Over 30 minutes  SIGNED:  Azucena Fallen, DO [facility] 11/15/2020, 1:20 PM Pager   If 7PM-7AM, please contact night-coverage [email or web address]

## 2020-11-15 NOTE — Progress Notes (Signed)
Discharge instructions given to patient and wife. IV removed, items in the room collected, equipment brought down to the patients car. Patient discharged

## 2020-11-15 NOTE — Plan of Care (Signed)
  Problem: Education: Goal: Knowledge of General Education information will improve Description: Including pain rating scale, medication(s)/side effects and non-pharmacologic comfort measures Outcome: Progressing   Problem: Clinical Measurements: Goal: Diagnostic test results will improve Outcome: Progressing Goal: Respiratory complications will improve Outcome: Progressing Goal: Cardiovascular complication will be avoided Outcome: Progressing   Problem: Nutrition: Goal: Adequate nutrition will be maintained Outcome: Progressing   

## 2020-11-15 NOTE — Progress Notes (Signed)
Received call from wife. She reports that she wants to use [facility]. Contacted Denyse Amass with [facility] and he accepted the referral. Notified Denyse Amass that pt has been D/C today.

## 2020-11-15 NOTE — Progress Notes (Signed)
Physical Therapy Treatment Patient Details Name: Brian Ruiz MRN: [id number] DOB: 08/02/1945 Today's Date: 11/15/2020    History of Present Illness Pt is a 75 y.o. M admitted to [facility] after 3 falls, the last fall resulting in pt hitting his head. Upon evaluation in ED, pt was found tohave pancreatitis and choledocholithiasis. On 6/17 pt had s/p laparoscopic cholecystectomy. Significant past medical history of arthritis, chronic back pain, hypertension, upper GI bleed secondary to PUD 05/2019.  Past thyroidectomy and left inguinal hernia repair.    PT Comments    Pt make good progress towards his physical therapy goals; exhibits improved activity tolerance and ambulation distance. Pt ambulating x 200 feet with a walker at a supervision level. Education provided regarding activity recommendations, proper footwear, and stair negotiation. Recommend HHPT follow up.     Follow Up Recommendations  Home health PT     Equipment Recommendations  Rolling walker with 5" wheels    Recommendations for Other Services       Precautions / Restrictions Precautions Precautions: Fall Restrictions Weight Bearing Restrictions: No    Mobility  Bed Mobility               General bed mobility comments: OOB in chair    Transfers Overall transfer level: Needs assistance Equipment used: Rolling walker (2 wheeled) Transfers: Sit to/from Stand Sit to Stand: Supervision         General transfer comment: Cues for scooting forward to edge of chair, good power up to stand  Ambulation/Gait Ambulation/Gait assistance: Supervision Gait Distance (Feet): 200 Feet Assistive device: Rolling walker (2 wheeled) Gait Pattern/deviations: Step-to pattern;Decreased stride length;Decreased stance time - right;Trunk flexed;Antalgic Gait velocity: decreased   General Gait Details: Slow but steady gait. Cues for upright posture and activity pacing. Pt with loss of trunk height and decreased speed with  distance   Stairs             Wheelchair Mobility    Modified Rankin (Stroke Patients Only)       Balance Overall balance assessment: Needs assistance Sitting-balance support: No upper extremity supported Sitting balance-Leahy Scale: Good     Standing balance support: Bilateral upper extremity supported Standing balance-Leahy Scale: Fair Standing balance comment: uses walker for balance                            Cognition Arousal/Alertness: Awake/alert Behavior During Therapy: WFL for tasks assessed/performed Overall Cognitive Status: History of cognitive impairments - at baseline                                        Exercises      General Comments        Pertinent Vitals/Pain Pain Assessment: Faces Faces Pain Scale: Hurts little more Pain Location: abdomen/surgical site Pain Descriptors / Indicators: Discomfort;Operative site guarding Pain Intervention(s): Monitored during session    Home Living                      Prior Function            PT Goals (current goals can now be found in the care plan section) Acute Rehab PT Goals Patient Stated Goal: To improve strength Potential to Achieve Goals: Good Progress towards PT goals: Progressing toward goals    Frequency    Min 3X/week  PT Plan Current plan remains appropriate    Co-evaluation              AM-PAC PT "6 Clicks" Mobility   Outcome Measure  Help needed turning from your back to your side while in a flat bed without using bedrails?: None Help needed moving from lying on your back to sitting on the side of a flat bed without using bedrails?: A Little Help needed moving to and from a bed to a chair (including a wheelchair)?: A Little Help needed standing up from a chair using your arms (e.g., wheelchair or bedside chair)?: A Little Help needed to walk in hospital room?: A Little Help needed climbing 3-5 steps with a railing? : A  Little 6 Click Score: 19    End of Session   Activity Tolerance: Patient tolerated treatment well Patient left: in chair;with call bell/phone within reach;with family/visitor present Nurse Communication: Mobility status PT Visit Diagnosis: Unsteadiness on feet (R26.81);Other abnormalities of gait and mobility (R26.89);Muscle weakness (generalized) (M62.81)     Time: 5643-3295 PT Time Calculation (min) (ACUTE ONLY): 20 min  Charges:  $Gait Training: 8-22 mins                     Lillia Pauls, PT, DPT Acute Rehabilitation Services Pager [phone] Office [phone]    Norval Morton 11/15/2020, 3:23 PM

## 2020-11-15 NOTE — Progress Notes (Signed)
[facility] Progress Note:   2 Days Post-Op  Subjective: Mental status is clear.  Complaints none. Objective: Vital signs in last 24 hours: Temp:  [97.7 F (36.5 C)-98.4 F (36.9 C)] 98.4 F (36.9 C) (06/19 0410) Pulse Rate:  [72-79] 79 (06/19 0410) Resp:  [16-18] 17 (06/19 0410) BP: (105-160)/(80-102) 143/84 (06/19 0410) SpO2:  [93 %-95 %] 95 % (06/19 0410) Weight:  [92.4 kg] 92.4 kg (06/19 0410)  Intake/Output from previous day: 06/18 0701 - 06/19 0700 In: 899.6 [P.O.:150; I.V.:499.6; IV Piggyback:250] Out: 850 [Urine:850] Intake/Output this shift: No intake/output data recorded.  Physical Exam: Work of breathing is normal.  Up on toilet having BMs  Lab Results:  Results for orders placed or performed during the hospital encounter of 11/10/20 (from the past 48 hour(s))  CBC     Status: Abnormal   Collection Time: 11/14/20  2:22 AM  Result Value Ref Range   WBC 17.3 (H) 4.0 - 10.5 K/uL   RBC 3.79 (L) 4.22 - 5.81 MIL/uL   Hemoglobin 11.8 (L) 13.0 - 17.0 g/dL   HCT 15.1 (L) 76.1 - 60.7 %   MCV 95.8 80.0 - 100.0 fL   MCH 31.1 26.0 - 34.0 pg   MCHC 32.5 30.0 - 36.0 g/dL   RDW 37.1 06.2 - 69.4 %   Platelets 195 150 - 400 K/uL   nRBC 0.0 0.0 - 0.2 %    Comment: Performed at [facility], [address], [place], [place] [address]  Comprehensive metabolic panel     Status: Abnormal   Collection Time: 11/14/20  2:22 AM  Result Value Ref Range   Sodium 131 (L) 135 - 145 mmol/L   Potassium 3.8 3.5 - 5.1 mmol/L   Chloride 97 (L) 98 - 111 mmol/L   CO2 23 22 - 32 mmol/L   Glucose, Bld 98 70 - 99 mg/dL    Comment: Glucose reference range applies only to samples taken after fasting for at least 8 hours.   BUN 11 8 - 23 mg/dL   Creatinine, Ser 7.03 0.61 - 1.24 mg/dL   Calcium 8.6 (L) 8.9 - 10.3 mg/dL   Total Protein 5.6 (L) 6.5 - 8.1 g/dL   Albumin 3.0 (L) 3.5 - 5.0 g/dL   AST 50 (H) 15 - 41 U/L   ALT 46 (H) 0 - 44 U/L   Alkaline Phosphatase 39 38 - 126 U/L    Total Bilirubin 1.3 (H) 0.3 - 1.2 mg/dL   GFR, Estimated >50 >09 mL/min    Comment: (NOTE) Calculated using the CKD-EPI Creatinine Equation (2021)    Anion gap 11 5 - 15    Comment: Performed at [facility], [address] [place], [place] [address]  CBC     Status: Abnormal   Collection Time: 11/15/20  2:06 AM  Result Value Ref Range   WBC 16.2 (H) 4.0 - 10.5 K/uL   RBC 3.75 (L) 4.22 - 5.81 MIL/uL   Hemoglobin 11.9 (L) 13.0 - 17.0 g/dL   HCT 99.3 (L) 71.6 - 96.7 %   MCV 93.6 80.0 - 100.0 fL   MCH 31.7 26.0 - 34.0 pg   MCHC 33.9 30.0 - 36.0 g/dL   RDW 89.3 81.0 - 17.5 %   Platelets 217 150 - 400 K/uL   nRBC 0.0 0.0 - 0.2 %    Comment: Performed at [facility], [address], [place], [place] [address]  Comprehensive metabolic panel  Status: Abnormal   Collection Time: 11/15/20  2:06 AM  Result Value Ref Range   Sodium 128 (L) 135 - 145 mmol/L   Potassium 3.5 3.5 - 5.1 mmol/L   Chloride 92 (L) 98 - 111 mmol/L   CO2 25 22 - 32 mmol/L   Glucose, Bld 95 70 - 99 mg/dL    Comment: Glucose reference range applies only to samples taken after fasting for at least 8 hours.   BUN 11 8 - 23 mg/dL   Creatinine, Ser 4.53 0.61 - 1.24 mg/dL   Calcium 8.7 (L) 8.9 - 10.3 mg/dL   Total Protein 5.7 (L) 6.5 - 8.1 g/dL   Albumin 2.9 (L) 3.5 - 5.0 g/dL   AST 40 15 - 41 U/L   ALT 44 0 - 44 U/L   Alkaline Phosphatase 35 (L) 38 - 126 U/L   Total Bilirubin 1.1 0.3 - 1.2 mg/dL   GFR, Estimated >64 >68 mL/min    Comment: (NOTE) Calculated using the CKD-EPI Creatinine Equation (2021)    Anion gap 11 5 - 15    Comment: Performed at [facility], [address], [place], [place] [address]    Radiology/Results: DG Cholangiogram Operative  Result Date: 11/13/2020 CLINICAL DATA:  75 year old male with cholecystitis EXAM: INTRAOPERATIVE CHOLANGIOGRAM TECHNIQUE: Cholangiographic images from the C-arm fluoroscopic device were submitted for interpretation post-operatively.  Please see the procedural report for the amount of contrast and the fluoroscopy time utilized. COMPARISON:  None. FINDINGS: Surgical instruments project over the upper abdomen. There is cannulation of the cystic duct/gallbladder neck, with antegrade infusion of contrast. Caliber of the extrahepatic ductal system is enlarged No definite filling defect within the extrahepatic ducts identified. Free flow of contrast across the ampulla. IMPRESSION: Intraoperative cholangiogram demonstrates enlarged extrahepatic biliary ducts, with no definite filling defects identified. Free flow of contrast across the ampulla. Please refer to the dictated operative report for full details of intraoperative findings and procedure Electronically Signed   By: Gilmer Mor D.O.   On: 11/13/2020 11:24    Anti-infectives: Anti-infectives (From admission, onward)    Start     Dose/Rate Route Frequency Ordered Stop   11/11/20 0200  piperacillin-tazobactam (ZOSYN) IVPB 3.375 g        3.375 g 12.5 mL/hr over 240 Minutes Intravenous Every 8 hours 11/11/20 0104 11/14/20 2359   11/10/20 2100  ceFEPIme (MAXIPIME) 2 g in sodium chloride 0.9 % 100 mL IVPB        2 g 200 mL/hr over 30 Minutes Intravenous  Once 11/10/20 2053 11/11/20 0042   11/10/20 2015  ceFEPIme (MAXIPIME) 1 g in sodium chloride 0.9 % 100 mL IVPB  Status:  Discontinued       See Hyperspace for full Linked Orders Report.   1 g 200 mL/hr over 30 Minutes Intravenous  Once 11/10/20 2009 11/10/20 2350   11/10/20 2015  metroNIDAZOLE (FLAGYL) IVPB 500 mg       See Hyperspace for full Linked Orders Report.   500 mg 100 mL/hr over 60 Minutes Intravenous  Once 11/10/20 2009 11/10/20 2321       Assessment/Plan: Problem List: Patient Active Problem List   Diagnosis Date Noted   Acute pancreatitis 11/10/2020   Cholecystitis    Choledocholithiasis    Chronic diastolic CHF (congestive heart failure) (HCC) 05/16/2018   Chronic fatigue 06/14/2017   Palpitations  06/14/2017   Aortic ectasia (HCC) 11/15/2016   Age-related osteoporosis without current pathological fracture 10/27/2016   Screening for  AAA (abdominal aortic aneurysm) 10/27/2016   Screening for prostate cancer 10/27/2016   Need for hepatitis C screening test 10/27/2016   Encounter for general adult medical examination without abnormal findings 11/26/2013   Screening for osteoporosis 11/26/2013   Kyphoscoliosis deformity of spine 07/04/2013   Enlarged prostate with lower urinary tract symptoms (LUTS) 04/12/2013   Screen for colon cancer 04/12/2013   Undiagnosed cardiac murmurs 04/12/2013   Backache 04/12/2013   White coat syndrome with hypertension 04/12/2013   Lumbar spinal stenosis 10/29/2012   DDD (degenerative disc disease), lumbosacral 03/16/2012   Lumbosacral radiculopathy 03/16/2012   Generalized anxiety disorder 03/14/2012   Renal pain 12/16/2011   Sacroiliitis, not elsewhere classified (HCC) 08/11/2011   Sacroiliac joint pain 07/27/2011   Bilateral hip pain 07/07/2011   Chronic pain syndrome 07/07/2011   Spondylolisthesis of lumbosacral region 07/07/2011   Hereditary and idiopathic peripheral neuropathy 05/27/2011   Accidental drug overdose 05/15/2011   Guilty feelings 05/15/2011   Hypertension 05/15/2011   Hypotension 05/15/2011   Bradycardia 05/15/2011   Encephalopathy acute 05/15/2011   Chronic back pain 05/15/2011   Vitamin D deficiency 03/08/2011    Worked with PT and improving.   2 Days Post-Op    LOS: 5 days   Matt B. Daphine Deutscher, MD, [facility] [phone] to reach the surgeon on call.    11/15/2020 9:01 AM

## 2020-11-16 LAB — SURGICAL PATHOLOGY

## 2020-11-26 ENCOUNTER — Encounter (HOSPITAL_COMMUNITY): Payer: Self-pay | Admitting: General Surgery

## 2021-07-08 ENCOUNTER — Telehealth: Payer: Self-pay

## 2021-07-08 NOTE — Telephone Encounter (Signed)
Attempted to contact patient/patient's wife Brian Ruiz to schedule a Palliative Care consult appointment. No answer left a message on mobile and home to return call.

## 2021-07-09 ENCOUNTER — Telehealth: Payer: Self-pay

## 2021-07-09 NOTE — Telephone Encounter (Signed)
°  Spoke with patient and scheduled a in-person Palliative Consult for 07/15/21 @ 10AM.  Consent obtained; updated Outlook/Netsmart/Team List and Epic.

## 2021-07-15 ENCOUNTER — Other Ambulatory Visit: Payer: Medicare Other | Admitting: Family Medicine

## 2021-07-19 ENCOUNTER — Other Ambulatory Visit: Payer: Self-pay

## 2021-07-19 ENCOUNTER — Telehealth: Payer: Medicare Other | Admitting: Family Medicine

## 2021-07-19 ENCOUNTER — Encounter: Payer: Self-pay | Admitting: Family Medicine

## 2021-07-19 DIAGNOSIS — M5417 Radiculopathy, lumbosacral region: Secondary | ICD-10-CM

## 2021-07-19 DIAGNOSIS — Z515 Encounter for palliative care: Secondary | ICD-10-CM

## 2021-07-19 DIAGNOSIS — R5381 Other malaise: Secondary | ICD-10-CM

## 2021-07-19 NOTE — Progress Notes (Signed)
[facility] Palliative Care Consult Note Telephone: [phone]  Fax: [phone]   Date of encounter: 07/19/21 12:25 PM PATIENT NAME: Brian Ruiz [address] [address]   [phone] (home)  DOB: April 13, 1946 MRN: [id number] PRIMARY CARE PROVIDER:    Bernerd Limbo, MD,  [address] [place] [address] [phone]  REFERRING PROVIDER:   Bernerd Limbo, [address] [place],  [place] [address] [phone]  RESPONSIBLE PARTY:    Contact Information     Name Relation Home Work Marre[place] [phone]  [phone]   [name]   [phone]       I connected with  Norm Salt on 07/19/21 by a video enabled telemedicine application but had connectivity issues with video so visit completed telephonically . Verified that I am speaking with the correct person using two identifiers.   I discussed the limitations of evaluation and management by telemedicine. The patient expressed understanding and agreed to proceed.          ASSESSMENT, SYMPTOM MANAGEMENT AND PLAN / RECOMMENDATIONS:   Palliative Care Encounter Has HC POA-wife then adult children.  Will discuss MOST at follow up visit. Multiple allergies, has had injected steroids to manage pain in specific joints. Agree with Tylenol. Question if pt might benefit from Lidoderm patches, has had accidental overdose with multiple sedating. Wife and primary caregiver is ill herself, may benefit from SW or alternative setting referral to identify resources for assistance around the home.  Daughter and son live out of the area and may need to be involved in planning long term.   Lumbosacral radiculopathy Affects balance.  Encourage continued seated HEP to maintain function. Discuss energy conservation techniques to maximize independence.  3.   Physical Debility/falls Encourage slow position  change. Manage pain and avoid sedation as much as possible. Encourage fluid intake.   Follow up Palliative Care Visit: Palliative care will continue to follow for complex medical decision making, advance care planning, and clarification of goals. Return 08/03/21 at 11:30 am or prn.    This visit was coded based on medical decision making (MDM).  PPS: 50%  HOSPICE ELIGIBILITY/DIAGNOSIS: TBD  Chief Complaint:  [facility] received a referral to follow up with patient for chronic disease management, advance directive and defining/refining goals of care. Pt denies any known hx of CHF.  Does have hx of diastolic dysfunction on echo.  HISTORY OF PRESENT ILLNESS:  Brian Ruiz is a 76 y.o. year old male  with hx of PVCs, gallstone pancreatitis with multiple falls (June 2022), lumbosacral radiculopathy and chronic fatigue.  He is currently unable to stand for any significant length of time due to pain in his back, hips and legs.  He has had prior steroid injections and has multiple pain med allergies and intolerances.  In June 2022 he had several falls, was taken to the ED and found to have gallstones and pancreatitis.  He underwent laparoscopic cholecystectomy.  Pain is so severe on standing that he primarily stands/ambulates only to go to the bathroom or to bed.  Lives with wife who herself has been hospitalized 5 times over the last year with weight loss and hyponatremia.  He states he has some issues with remembering some words when trying to express himself.  Given the effort involved he showers every 2-4 days, has no trouble with incontinence but is primarily in the bed or chair.  He has a daughter who lives in [place] who comes around almost weekly to help and a son who lives in [place].  They do have some help from neighbors in the area.   History obtained from review of EMR, discussion with family and/or Mr. Gershon.  I reviewed available labs, medications, imaging,  studies and related documents from the EMR.  Records reviewed and summarized above.   ROS General: NAD, denies weight loss since last June 2022 EYES: denies vision changes ENMT: denies dysphagia Cardiovascular: denies chest pain, denies DOE Pulmonary: denies cough, denies increased SOB Abdomen: endorses good appetite, denies constipation, endorses continence of bowel GU: denies dysuria, endorses continence of urine MSK:  endorses increased weakness and pain with mobility, no falls reported since June 2022, Uses rolling walker Skin: denies rashes or wounds Neurological: denies insomnia, Pain in low back and legs with standing or walking Psych: Endorses positive mood Heme/lymph/immuno: denies bruises, abnormal bleeding  Physical Exam: (limited to audible exam only) General: Able to speak in complete sentences without having to stop to breathe Pulmonary: No audible wheezing, cough, dyspnea or tachypnea Neuro:  Occasional diff with word finding, speech clear Psych: alert and oriented x 3, some rambling/tangential thoughts   CURRENT PROBLEM LIST:  Patient Active Problem List   Diagnosis Date Noted   Acute pancreatitis 11/10/2020   Cholecystitis    Choledocholithiasis    Chronic diastolic CHF (congestive heart failure) ([facility]) 05/16/2018   Chronic fatigue 06/14/2017   Palpitations 06/14/2017   Aortic ectasia ([facility]) 11/15/2016   Age-related osteoporosis without current pathological fracture 10/27/2016   Screening for AAA (abdominal aortic aneurysm) 10/27/2016   Screening for prostate cancer 10/27/2016   Need for hepatitis C screening test 10/27/2016   Encounter for general adult medical examination without abnormal findings 11/26/2013   Screening for osteoporosis 11/26/2013   Kyphoscoliosis deformity of spine 07/04/2013   Enlarged prostate with lower urinary tract symptoms (LUTS) 04/12/2013   Screen for colon cancer 04/12/2013   Undiagnosed cardiac murmurs 04/12/2013   Backache  04/12/2013   White coat syndrome with hypertension 04/12/2013   Lumbar spinal stenosis 10/29/2012   DDD (degenerative disc disease), lumbosacral 03/16/2012   Lumbosacral radiculopathy 03/16/2012   Generalized anxiety disorder 03/14/2012   Renal pain 12/16/2011   Sacroiliitis, not elsewhere classified ([facility]) 08/11/2011   Sacroiliac joint pain 07/27/2011   Bilateral hip pain 07/07/2011   Chronic pain syndrome 07/07/2011   Spondylolisthesis of lumbosacral region 07/07/2011   Hereditary and idiopathic peripheral neuropathy 05/27/2011   Accidental drug overdose 05/15/2011   Guilty feelings 05/15/2011   Hypertension 05/15/2011   Hypotension 05/15/2011   Bradycardia 05/15/2011   Encephalopathy acute 05/15/2011   Chronic back pain 05/15/2011   Vitamin D deficiency 03/08/2011   PAST MEDICAL HISTORY:  Active Ambulatory Problems    Diagnosis Date Noted   Accidental drug overdose 05/15/2011   Guilty feelings 05/15/2011   Hypertension 05/15/2011   Hypotension 05/15/2011   Bradycardia 05/15/2011   Encephalopathy acute 05/15/2011   Chronic back pain 05/15/2011   Age-related osteoporosis without current pathological fracture 10/27/2016   Aortic ectasia ([facility]) 11/15/2016   Bilateral hip pain 07/07/2011   Sacroiliac joint pain 07/27/2011   Chronic fatigue 06/14/2017   Chronic pain syndrome 07/07/2011   DDD (degenerative disc disease), lumbosacral 03/16/2012   Encounter for general adult medical examination without abnormal findings 11/26/2013   Screening for AAA (abdominal aortic aneurysm) 10/27/2016   Screening for osteoporosis 11/26/2013   Screening for prostate cancer 10/27/2016   Enlarged  prostate with lower urinary tract symptoms (LUTS) 04/12/2013   Generalized anxiety disorder 03/14/2012   Hereditary and idiopathic peripheral neuropathy 05/27/2011   Kyphoscoliosis deformity of spine 07/04/2013   Lumbar spinal stenosis 10/29/2012   Lumbosacral radiculopathy 03/16/2012   Need for  hepatitis C screening test 10/27/2016   Screen for colon cancer 04/12/2013   Renal pain 12/16/2011   Palpitations 06/14/2017   Sacroiliitis, not elsewhere classified ([facility]) 08/11/2011   Spondylolisthesis of lumbosacral region 07/07/2011   Undiagnosed cardiac murmurs 04/12/2013   Vitamin D deficiency 03/08/2011   Backache 04/12/2013   White coat syndrome with hypertension 04/12/2013   Chronic diastolic CHF (congestive heart failure) ([facility]) 05/16/2018   Acute pancreatitis 11/10/2020   Cholecystitis    Choledocholithiasis    Resolved Ambulatory Problems    Diagnosis Date Noted   Left inguinal hernia 04/03/2012   Past Medical History:  Diagnosis Date   Arthritis    Back pain, chronic    Heart murmur    Inguinal hernia    Polyneuropathy    S/P thyroidectomy 1967   Scoliosis    Urinary retention    SOCIAL HX:  Social History   Tobacco Use   Smoking status: Never   Smokeless tobacco: Never  Substance Use Topics   Alcohol use: No   FAMILY HX:  Family History  Problem Relation Age of Onset   Heart disease Father        Preferred Pharmacy: [facility] [phone] ALLERGIES:  Allergies  Allergen Reactions   Aspirin Shortness Of Breath    Other reaction(s): Bronchospasm (ALLERGY/intolerance)   Nsaids Shortness Of Breath    Other reaction(s): Bronchospasm (ALLERGY/intolerance)   Oxycodone Other (See Comments)    Agitation. Per the any pain medication that has codeine in it causes that reaction. Other reaction(s): Other Agitation. Per the any pain medication that has codeine in it causes that reaction.   Tolmetin Shortness Of Breath   Tramadol Anaphylaxis   Codeine Anxiety    Other reaction(s): Bronchospasm (ALLERGY/intolerance), Other (See Comments) Irritable      PERTINENT MEDICATIONS:  Outpatient Encounter Medications as of 07/19/2021  Medication Sig   acetaminophen (TYLENOL) 500 MG tablet Take 1,000 mg by mouth every 8 (eight) hours as  needed. For pain.    Artificial Tear Ointment (DRY EYES OP) Apply 1 drop to eye daily as needed (dry eyes).   carvedilol (COREG) 25 MG tablet Take 1 tablet (25 mg total) by mouth 2 (two) times daily. Please make overdue appt with Dr. Acie Fredrickson before anymore refills. 1st attempt   omeprazole (PRILOSEC) 40 MG capsule Take 40 mg by mouth daily.   senna-docusate (SENOKOT-S) 8.6-50 MG tablet Take 2 tablets by mouth 2 (two) times daily as needed for moderate constipation.   sertraline (ZOLOFT) 25 MG tablet Take 25 mg by mouth 3 (three) times daily.   No facility-administered encounter medications on file as of 07/19/2021.     ---------------------------------------------------------------------------------------------------------------------------------------------------------------------------------------------------------------- Advance Care Planning/Goals of Care: Goals include to maximize quality of life and symptom management. Patient gave his/her permission to discuss.Our advance care planning conversation included a discussion about:    The value and importance of advance care planning-his wife is his first [place] followed by his daughter and son. Exploration of personal, cultural or spiritual beliefs that might influence medical decisions  Exploration of goals of care in the event of a sudden injury or illness-Does not think he wants to have CPR or be intubated but wants to discuss in person at next  visit with wife. Decision not to resuscitate or to de-escalate disease focused treatments due to poor prognosis.  CODE STATUS: Full Code    Thank you for the opportunity to participate in the care of Mr. Honeck.  The palliative care team will continue to follow. Please call our office at [phone] if we can be of additional assistance.   Marijo Conception, FNP-C  COVID-19 PATIENT SCREENING TOOL Asked and negative response unless otherwise noted:  Have you had symptoms of covid, tested  positive or been in contact with someone with symptoms/positive test in the past 5-10 days? No

## 2021-08-03 ENCOUNTER — Encounter: Payer: Self-pay | Admitting: Family Medicine

## 2021-08-03 ENCOUNTER — Other Ambulatory Visit: Payer: Medicare Other | Admitting: Family Medicine

## 2021-08-03 ENCOUNTER — Other Ambulatory Visit: Payer: Self-pay

## 2021-08-03 VITALS — BP 134/70 | HR 57 | Temp 97.9°F | Resp 16

## 2021-08-03 DIAGNOSIS — M810 Age-related osteoporosis without current pathological fracture: Secondary | ICD-10-CM

## 2021-08-03 DIAGNOSIS — M419 Scoliosis, unspecified: Secondary | ICD-10-CM

## 2021-08-03 DIAGNOSIS — E43 Unspecified severe protein-calorie malnutrition: Secondary | ICD-10-CM | POA: Insufficient documentation

## 2021-08-03 DIAGNOSIS — Z515 Encounter for palliative care: Secondary | ICD-10-CM | POA: Insufficient documentation

## 2021-08-03 DIAGNOSIS — R5381 Other malaise: Secondary | ICD-10-CM

## 2021-08-03 DIAGNOSIS — G609 Hereditary and idiopathic neuropathy, unspecified: Secondary | ICD-10-CM

## 2021-08-03 DIAGNOSIS — G894 Chronic pain syndrome: Secondary | ICD-10-CM

## 2021-08-03 NOTE — Progress Notes (Signed)
? ? ?Man[facility] ?Community Palliative Care Consult Note ?Telephone: [phone]  ?Fax: [phone]  ? ? ?Date of encounter: 08/03/21 ?12:19 PM ?PATIENT NAME: Brian Ruiz ?[address] ?[place] [address]   ?[phone] (home)  ?DOB: [birth year] ?MRN: [id number] ?PRIMARY CARE PROVIDER:    ?Bernerd Limbo, MD,  ?[address] ?[place] [address] ?[phone] ? ?REFERRING PROVIDER:   ?Bernerd Limbo, MD ?[address] ?[address] ?[place],  [place] [address] ?[phone] ? ?RESPONSIBLE PARTY:    ?Contact Information   ? ? Name Relation Home Work Mobile  ? Alfonso, Carden Spouse [phone]  [phone]  ? Benjie Karvonen Daughter   [phone]  ? ?  ? ? ? ?I met face to face with patient and wife in their home. Palliative Care was asked to follow this patient by consultation request of  Bernerd Limbo, MD to address advance care planning and complex medical decision making. This is a follow up visit. ? ?                                 ASSESSMENT, SYMPTOM MANAGEMENT AND PLAN / RECOMMENDATIONS:  ? Palliative Care Encounter ?Encourage to increase non-animal sources of protein. ? ? 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 ?Has had fentanyl inpatient in the past without difficulty.  ?Fentanyl 12 mcg/hr TD patch, change Q 72 hours, disp # 8, NR. ?Naloxone 0.4 mg/ml nasal spray prn for opiod overdose/respiratory depression. ?Have nurse follow up early next week for pain management ? ? Physical Debility ?Related to joint destruction, scoliosis and significant osteoporosis. ? ? Osteoporosis ?Last Dexa noted 11/04/16 with a Tscore -3.1 at left femur. ?Next visit obtain Vitamin D level, none noted in results. ?Question possible benefit of adding bisphosphonate with hx of GI ulcer/bleed. ? ? Hereditary and  Idiopathic neuropathy ?No noted Vitamin B12 level, obtain with next visit. ?No significant relief of symptoms with use of Gabapentin. ? ? Severe protein calorie malnutrition ?Obtain CBC, CMP, iron studies with next visit. ?Encouraged increased intake of plant protein sources like beans, nuts and legumes. ?May benefit from supplement. ? ?Advance Care Planning/Goals of Care: Goals include to maximize quality of life and symptom management. Will discuss at next follow up. ? ? ? ?Follow up Palliative Care Visit: Palliative care will continue to follow for complex medical decision making, advance care planning, and clarification of goals. Return 2 weeks or prn. ? ? ?This visit was coded based on medical decision making (MDM). ? ?PPS: 40% ? ?HOSPICE ELIGIBILITY/DIAGNOSIS: TBD ? ?Chief Complaint:  ?Palliative Care is following to address chronic management of pain and physical debility from arthritis, DDD and spondylolisthesis. ? ?HISTORY OF PRESENT ILLNESS:  Brian Ruiz is a 76 y.o. year old male with anemia, DDD, kyphoscoliosis, osteoporosis, lumbar spinal stenosis.  ?Chronically anemic, had 4 point loss in HGB and had endoscopy with ulcer but no dark tarry stools. Was noted after pt "passed out".  Eats well but prefers vegetables, not very fond of meat. He has had intermittent dizziness and weak. Pain is almost always 8-9, and sometimes 10.  No recent falls.  Bathing self but takes a lot longer.  Had been seeing Dr Wess Botts for pain management in the past and specialist identified pt is not a candidate for  surgical correction.  Has had fentanyl before in the hospital without adverse effects.  Hands cold no feeling and can't do buttons.  Hasn't been out of home for a couple of years. No nausea/vomiting, CP or SOB. No trouble with bowel and bladder continence. ? ?History obtained from review of EMR, discussion with wife and/or Mr. Wirick. Daughter Amy Rosezella [place] called to ask abut safety of Fentanyl and reassured that dose is  low and may need upward titration but this will be a slow process and that he will have Narcan on hand.  Pt reports remote hx of alcohol abuse but none since the 1990s. Last albumin low at 2.9 on 11/15/20, has some chronically low NA in mid 120s. Pt reports he eats well but continues to lose weight despite good appetite. ?I reviewed available labs, medications, imaging, studies and related documents from the EMR.  Records reviewed and summarized above.  ? ?ROS ? ?General: NAD ?EYES: denies vision changes ?ENMT: denies dysphagia ?Cardiovascular: denies chest pain, denies DOE, periphery cool/boggy and mildly cyanotic in hands and feet with + pulses. ?Pulmonary: denies cough, denies increased SOB ?Abdomen: endorses good appetite, denies constipation, endorses continence of bowel ?GU: denies dysuria, endorses continence of urine ?MSK:  denies increased weakness with pain with movement to back and bilaterally, no recent falls reported ?Skin: denies rashes or wounds ?Neurological: endorses constant pain of multiple sites pain, denies insomnia ?Psych: Endorses positive mood ?Heme/lymph/immuno: denies bruises, abnormal bleeding ? ?Physical Exam: ?Current and past weights: 203 lbs 11.3 ounces as of 11/05/20 ? ? ?Constitutional: NAD ?General: frail appearing, thin ?EYES: anicteric sclera, lids intact, no discharge  ?ENMT: intact hearing, oral mucous membranes moist, dentition intact ?CV: S1S2, RRR, no LE edema ?Pulmonary: LCTA, no increased work of breathing, no cough, room air ?Abdomen: normo-active BS + 4 quadrants, soft and non tender, no ascites ?GU: deferred ?MSK: noted  sarcopenia of distal extremities, moves all extremities, ambulatory with rollator ?Skin: warm and dry, no rashes or wounds on visible skin ?Neuro:  no generalized weakness,  no cognitive impairment ?Psych: non-anxious affect, A and O x 3 ?Hem/lymph/immuno: no widespread bruising ? ? ?Thank you for the opportunity to participate in the care of Brian Ruiz.   The palliative care team will continue to follow. Please call our office at [phone] if we can be of additional assistance.  ? ?Marijo Conception, FNP-C ? ?COVID-19 PATIENT SCREENING TOOL ?Asked and negative response unless otherwise noted:  ? ?Have you had symptoms of covid, tested positive or been in contact with someone with symptoms/positive test in the past 5-10 days?  No ? ?

## 2021-08-17 ENCOUNTER — Other Ambulatory Visit: Payer: Self-pay

## 2021-08-17 ENCOUNTER — Telehealth: Payer: Self-pay

## 2021-08-17 ENCOUNTER — Encounter: Payer: Self-pay | Admitting: Family Medicine

## 2021-08-17 ENCOUNTER — Other Ambulatory Visit: Payer: Medicare Other | Admitting: Family Medicine

## 2021-08-17 VITALS — BP 130/68 | HR 86 | Temp 97.7°F | Resp 18

## 2021-08-17 DIAGNOSIS — R5381 Other malaise: Secondary | ICD-10-CM

## 2021-08-17 DIAGNOSIS — G894 Chronic pain syndrome: Secondary | ICD-10-CM

## 2021-08-17 DIAGNOSIS — Z13820 Encounter for screening for osteoporosis: Secondary | ICD-10-CM

## 2021-08-17 DIAGNOSIS — E43 Unspecified severe protein-calorie malnutrition: Secondary | ICD-10-CM

## 2021-08-17 DIAGNOSIS — G609 Hereditary and idiopathic neuropathy, unspecified: Secondary | ICD-10-CM

## 2021-08-17 NOTE — Progress Notes (Signed)
? ? ?Manufacturing engineer ?Community Palliative Care Consult Note ?Telephone: [phone]  ?Fax: [phone]  ? ? ?Date of encounter: 08/17/21 ?9:54 PM ?PATIENT NAME: Brian Ruiz ?[address] ?[place] [address]   ?[phone] (home)  ?DOB: 1946/01/27 ?MRN: [id number] ?PRIMARY CARE PROVIDER:    ?Bernerd Limbo, MD,  ?[address] ?[place] [address] ?[phone] ? ?REFERRING PROVIDER:   ?Bernerd Limbo, MD ?[address] ?[address] ?[place],  [place] [address] ?[phone] ? ?RESPONSIBLE PARTY:    ?Contact Information   ? ? Name Relation Home Work Mobile  ? Bostyn, Bogie Spouse [phone]  [phone]  ? Benjie Karvonen Daughter   [phone]  ? ?  ? ? ? ?I met face to face with patient and wife in their home. Palliative Care was asked to follow this patient by consultation request of  Bernerd Limbo, MD to address advance care planning and complex medical decision making. This is a follow up visit. ? ?                                 ASSESSMENT, SYMPTOM MANAGEMENT AND PLAN / RECOMMENDATIONS: ? ? 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 ?Discussed possible use of Dilaudid in low dose for pain relief and will discuss with supervising physician. ? ? Physical Debility ?Related to joint destruction, scoliosis and significant osteoporosis. ?Continue to encourage protein intake ?Obtained CBC with diff, CMP, iron and TIBC, TSH ? ? Osteoporosis ?Last Dexa noted 11/04/16 with a Tscore -3.1 at left femur. ?Vitamin D level obtained ? ? Hereditary and Idiopathic neuropathy ?Vitamin B12 level ?No significant relief of symptoms with use of Gabapentin. ? ? Severe protein calorie malnutrition ?Obtain CBC, CMP, iron studies with next visit. ? ? ?Advance Care Planning/Goals of Care: Goals include to maximize quality of life and symptom  management. Will defer until pain better controlled to discuss. ? ? ? ?Follow up Palliative Care Visit: Palliative care will continue to follow for complex medical decision making, advance care planning, and clarification of goals. Return 4 weeks or prn. ? ? ?This visit was coded based on medical decision making (MDM). ? ?PPS: 40% ? ?HOSPICE ELIGIBILITY/DIAGNOSIS: TBD ? ?Chief Complaint:  ?Palliative Care is following to address chronic management of pain and physical debility from arthritis, DDD and spondylolisthesis. ? ?HISTORY OF PRESENT ILLNESS:  Brian Ruiz is a 76 y.o. year old male with anemia, DDD, kyphoscoliosis, osteoporosis, lumbar spinal stenosis. Pt with family member who had accidental overdose and passed away so he does not want to use Fentanyl. ?Has had GI bleed with Vioxx and with ASA, bronchospasm with NSAIDs, anaphylaxis with Tramadol.  Tylenol inadequate pain relief as well as Lidoderm patches. Codeine makes him irritable and agitated.  No recent falls. Wife indicated significant increase in pain for pt but concern for making his dizziness worse and increasing his fall risk. Had been seeing Dr Wess Botts for pain management in the past and specialist identified pt is not a candidate for surgical correction. Remains homebound with no recent labwork. No nausea/vomiting, CP or SOB. No trouble with bowel and bladder continence. ? ?History obtained from review of EMR, discussion with wife and/or Mr. Knoth. Pt reports remote hx of alcohol abuse but none since the 1990s. Last albumin low at 2.9 on 11/15/20, has  some chronically low NA in mid 120s. Pt reports he eats well but has lost 2-3 lbs recently.  ?I reviewed available labs, medications, imaging, studies and related documents from the EMR.  Records reviewed and summarized above.  ? ?ROS ?General: NAD ?EYES: denies vision changes ?ENMT: denies dysphagia ?Cardiovascular: denies chest pain, denies DOE ?Pulmonary: denies cough, denies increased  SOB ?Abdomen: endorses good appetite, denies constipation, endorses continence of bowel ?GU: denies dysuria, endorses continence of urine ?MSK:  denies increased weakness, no recent falls reported ?Skin: denies rashes or wounds ?Neurological: endorses constant pain of multiple sites worsening over the last 2 weeks despite scheduled Tylenol around the clock, denies insomnia ?Psych: Endorses irritability with pain ?Heme/lymph/immuno: denies bruises, abnormal bleeding ? ?Physical Exam: ?Current weight 169 lbs and past weights: 203 lbs 11.3 ounces as of 11/05/20 ? ?Constitutional: NAD, appears uncomfortable and chronically ill ?General: frail, thin ?EYES: anicteric sclera, lids intact, no discharge  ?ENMT: intact hearing, oral mucous membranes moist, dentition intact ?CV: S1S2, RRR, no LE edema with cool, dusky dependent extremities (hand/feet) ?Pulmonary: CTAB, no increased work of breathing, no cough, room air ?Abdomen: normo-active BS + 4 quadrants, soft and non tender, no ascites ?GU: deferred ?MSK: noted  sarcopenia of distal extremities, moves all extremities, minimally ambulatory with rollator ?Skin: warm and dry, no rashes or wounds on visible skin ?Neuro:  no generalized weakness,  no cognitive impairment ?Psych: non-anxious affect, depressed affect, irritable, A and O x 3 ?Hem/lymph/immuno: no widespread bruising ? ? ?Thank you for the opportunity to participate in the care of Brian Ruiz.  The palliative care team will continue to follow. Please call our office at [phone] if we can be of additional assistance.  ? ?Marijo Conception, FNP-C ? ?COVID-19 PATIENT SCREENING TOOL ?Asked and negative response unless otherwise noted:  ? ?Have you had symptoms of covid, tested positive or been in contact with someone with symptoms/positive test in the past 5-10 days?  No ? ?

## 2021-08-17 NOTE — Telephone Encounter (Signed)
Visit time changed to 1:30pm from 10:30 am ?

## 2021-09-02 ENCOUNTER — Telehealth: Payer: Self-pay

## 2021-09-02 NOTE — Telephone Encounter (Signed)
229 pm.  Request from Joycelyn Man, NP to contact patient regarding medication refill being completed and to schedule a home visit for next week.  Phone call made to patient and advised of refill being sent in.  Visit is scheduled for next Thursday at 11 am. ?

## 2021-09-02 NOTE — Telephone Encounter (Signed)
1144 am.  Incoming call from patient requesting a refill on his dilaudid.  He will be out after tomorrow and is requesting Damaris Hippo, NP fax orders to the pharmacy.  Advised that I would notify NP of order refill need. ? ?Communication sent to Damaris Hippo, NP. ?

## 2021-09-09 ENCOUNTER — Encounter: Payer: Self-pay | Admitting: Family Medicine

## 2021-09-09 ENCOUNTER — Other Ambulatory Visit: Payer: Medicare Other | Admitting: Family Medicine

## 2021-09-09 VITALS — BP 104/56 | HR 69 | Resp 18

## 2021-09-09 DIAGNOSIS — F418 Other specified anxiety disorders: Secondary | ICD-10-CM

## 2021-09-09 DIAGNOSIS — G894 Chronic pain syndrome: Secondary | ICD-10-CM

## 2021-09-09 NOTE — Progress Notes (Signed)
? ? ?Manufacturing engineer ?Community Palliative Care Consult Note ?Telephone: [phone]  ?Fax: [phone]  ? ? ?Date of encounter: 09/09/21 ?11:44 AM ?PATIENT NAME: Brian Ruiz ?[address] ?[place] [address]   ?[phone] (home)  ?DOB: September 16, 1945 ?MRN: [id number] ?PRIMARY CARE PROVIDER:    ?Brian Limbo, MD,  ?[address] ?[place] [address] ?[phone] ? ?REFERRING PROVIDER:   ?Brian Limbo, MD ?[address] ?[address] ?[place],  [place] [address] ?[phone] ? ?RESPONSIBLE PARTY:    ?Contact Information   ? ? Name Relation Home Work Mobile  ? Brian Ruiz Spouse [phone]  [phone]  ? Brian Ruiz Daughter   [phone]  ? ?  ? ? ? ?I met face to face with patient and wife in their home.  Palliative Care was asked to follow this patient by consultation request of  Brian Limbo, MD to address advance care planning and complex medical decision making. This is a follow up visit. ? ?                             ASSESSMENT, SYMPTOM MANAGEMENT AND PLAN / RECOMMENDATIONS:  ?  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  Pain goal 5 or less. Does not want to increase dose or frequency at present. ?Investigating use of lift chair to help with getting up and decreasing pain. Offered SW referral to discuss possible means of acquiring lift chair but pt did not want to pursue that at present. ? ?2.  Depression with Anxiety ?No SI/HI ?Offered medication +/- SW referral for counseling-pt declined both. ? ? ? ?Advance Care Planning/Goals of Care: Goals include to maximize quality of life and symptom management. Defer discussion until pain better controlled. ?CODE STATUS: ?Full code at present ? ? ? ?Follow up Palliative Care Visit: Palliative care will continue to follow for complex medical decision making, advance care planning, and  clarification of goals. Return 4 weeks or prn. ? ? ?This visit was coded based on medical decision making (MDM). ? ?PPS: 50% ? ?HOSPICE ELIGIBILITY/DIAGNOSIS: TBD ? ?Chief Complaint:  ?Palliative Care is following to help with medical management of chronic pain in setting of lumbosacral radiculopathy, osteoporosis, lumbosacral degenerative disc disease and spondylolisthesis. ? ?HISTORY OF PRESENT ILLNESS:  Brian Ruiz is a 76 y.o. 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  Pt waffling about taking or not taking pain medicine due to loss of close personal contact to overdose.  He states "about 25% improvement in pain" since starting the Dilaudid but does not want to increase dose or frequency and "may want to come off the medicine totally."  Wife states that pain medicine has helped but pt remains anxious about becoming addicted.  He describes increased sleep, feeling irritable at times and considering doing nothing for pain management.  Denies any thoughts of self harm but does endorse some feelings of helplessness and hopelessness. He and his wife have discussed his using a lift chair to aid him in getting up and down from a seated position but he has some concerns about this and if they move to a retirement facility if the lift chair would fit with their other  possessions. He did not want SW assistance with looking at sources for acquiring nor did he want SW referral to help with counseling. ? ?History obtained from review of EMR, discussion with wifer and/or Brian Ruiz.  ?I reviewed available labs, medications, imaging, studies and related documents from the EMR.  Records reviewed and summarized above.  ? ?ROS ?General: NAD ?EYES: denies vision changes ?ENMT: denies dysphagia ?Cardiovascular: denies chest pain, denies DOE ?Pulmonary:  denies cough, denies increased SOB ?Abdomen: endorses fair appetite with possible weight gain of "a pound or two", endorses some constipation but reports good bowel regimen at present, endorses continence of bowel ?GU: denies dysuria, endorses continence of urine ?MSK:  endorses increased gait instability, no falls reported ?Skin: denies rashes or wounds ?Neurological: endorses pain of 7-8/10 scale in bilat hips/low back and knees, denies insomnia ?Psych: Endorses depressed mood ?Heme/lymph/immuno: denies bruises, abnormal bleeding ? ?Physical Exam: ?Constitutional: NAD ?General: frail appearing, thin ?EYES: anicteric sclera, lids intact, no discharge  ?ENMT: intact hearing, oral mucous membranes moist, dentition intact ?CV: S1S2, RRR, no LE edema ?Pulmonary: CTAB, no increased work of breathing, no cough, room air ?Abdomen:normo-active BS + 4 quadrants, soft and non tender, no ascites ?GU: deferred ?MSK: no sarcopenia, moves all extremities, ambulatory with unsteady gait and rolling walker ?Skin: warm and dry, no rashes or wounds on visible skin ?Neuro:  no generalized weakness,  no cognitive impairment ?Psych: irritable and depressed, A and O x 3 ?Hem/lymph/immuno: no widespread bruising ? ? ?Thank you for the opportunity to participate in the care of Brian Ruiz.  The palliative care team will continue to follow. Please call our office at [phone] if we can be of additional assistance.  ? ?Brian Conception, FNP -C ? ?COVID-19 PATIENT SCREENING TOOL ?Asked and negative response unless otherwise noted:  ? ?Have you had symptoms of covid, tested positive or been in contact with someone with symptoms/positive test in the past 5-10 days? No ? ?

## 2021-09-12 ENCOUNTER — Encounter: Payer: Self-pay | Admitting: Family Medicine

## 2021-09-12 DIAGNOSIS — F418 Other specified anxiety disorders: Secondary | ICD-10-CM | POA: Insufficient documentation

## 2021-09-16 IMAGING — CR DG RIBS W/ CHEST 3+V*R*
3 series · 3 of 3 positions shown · non-contrast
Comparison: Radiograph 05/15/2011

CLINICAL DATA: Fall, generalized weakness

EXAM:
RIGHT RIBS AND CHEST - 3+ VIEW

[chest ap]
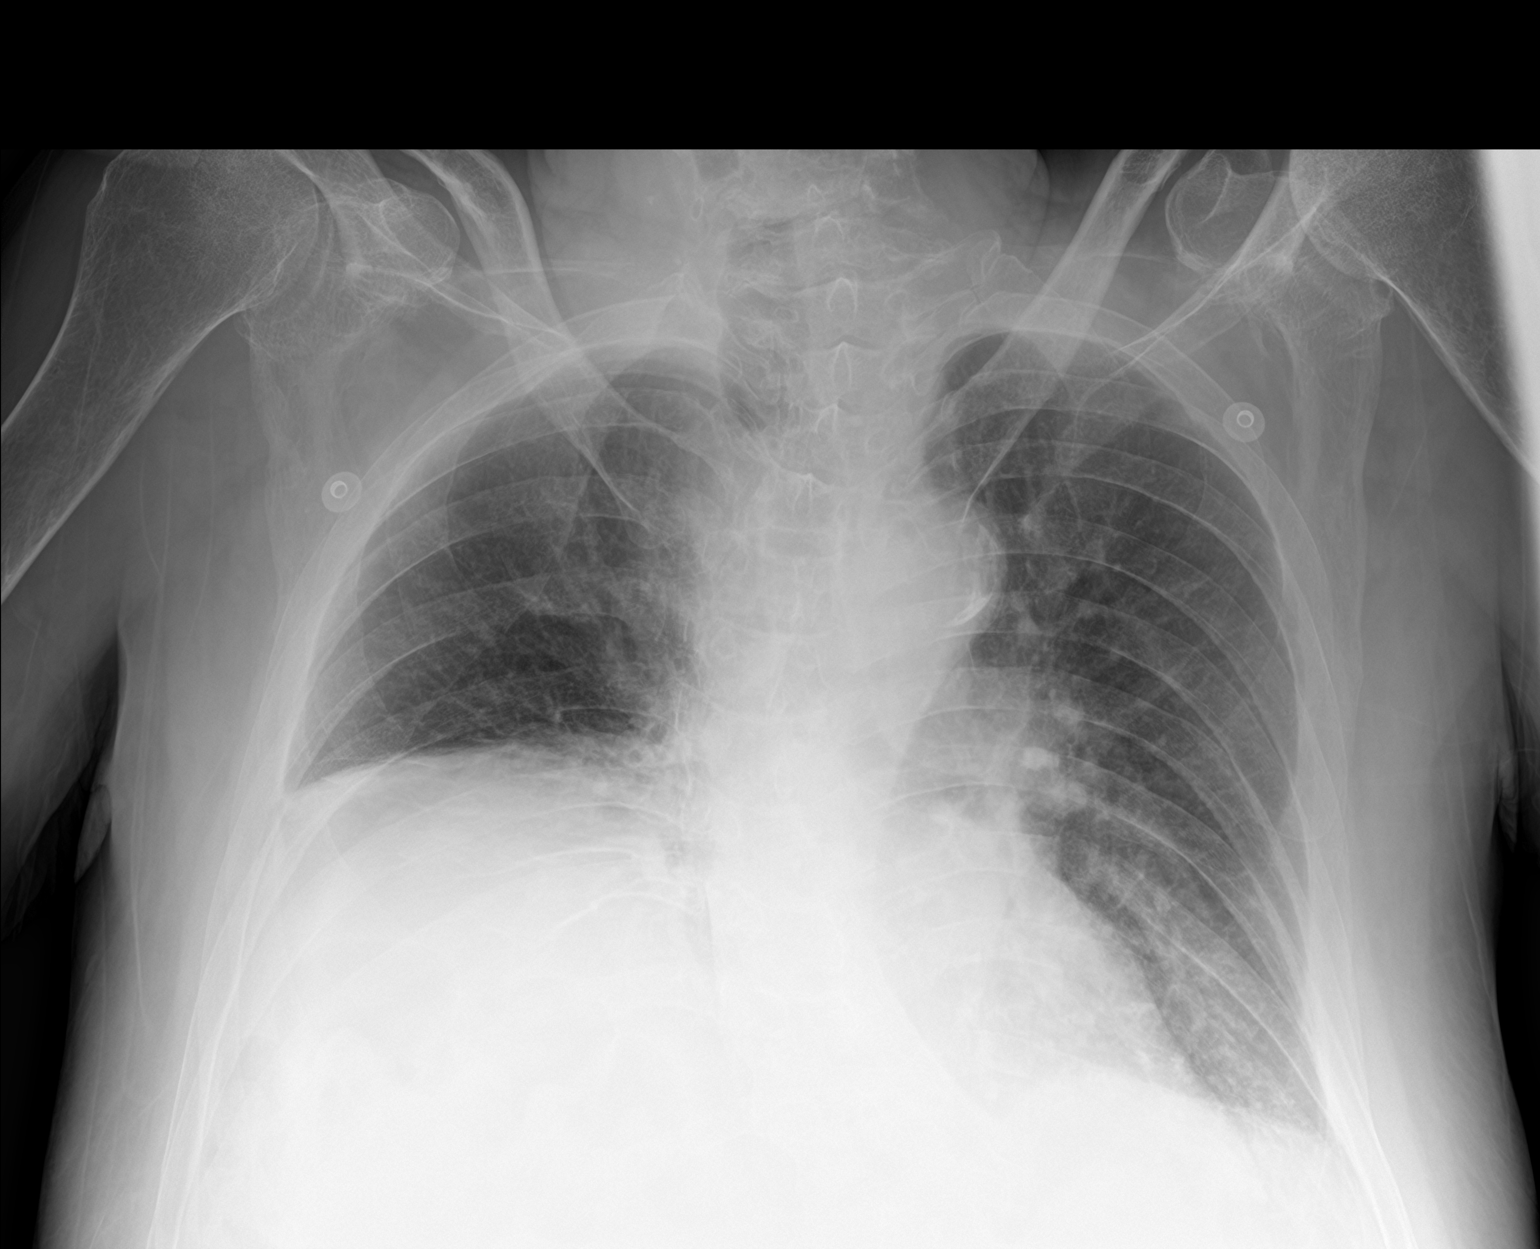

[rib ap]
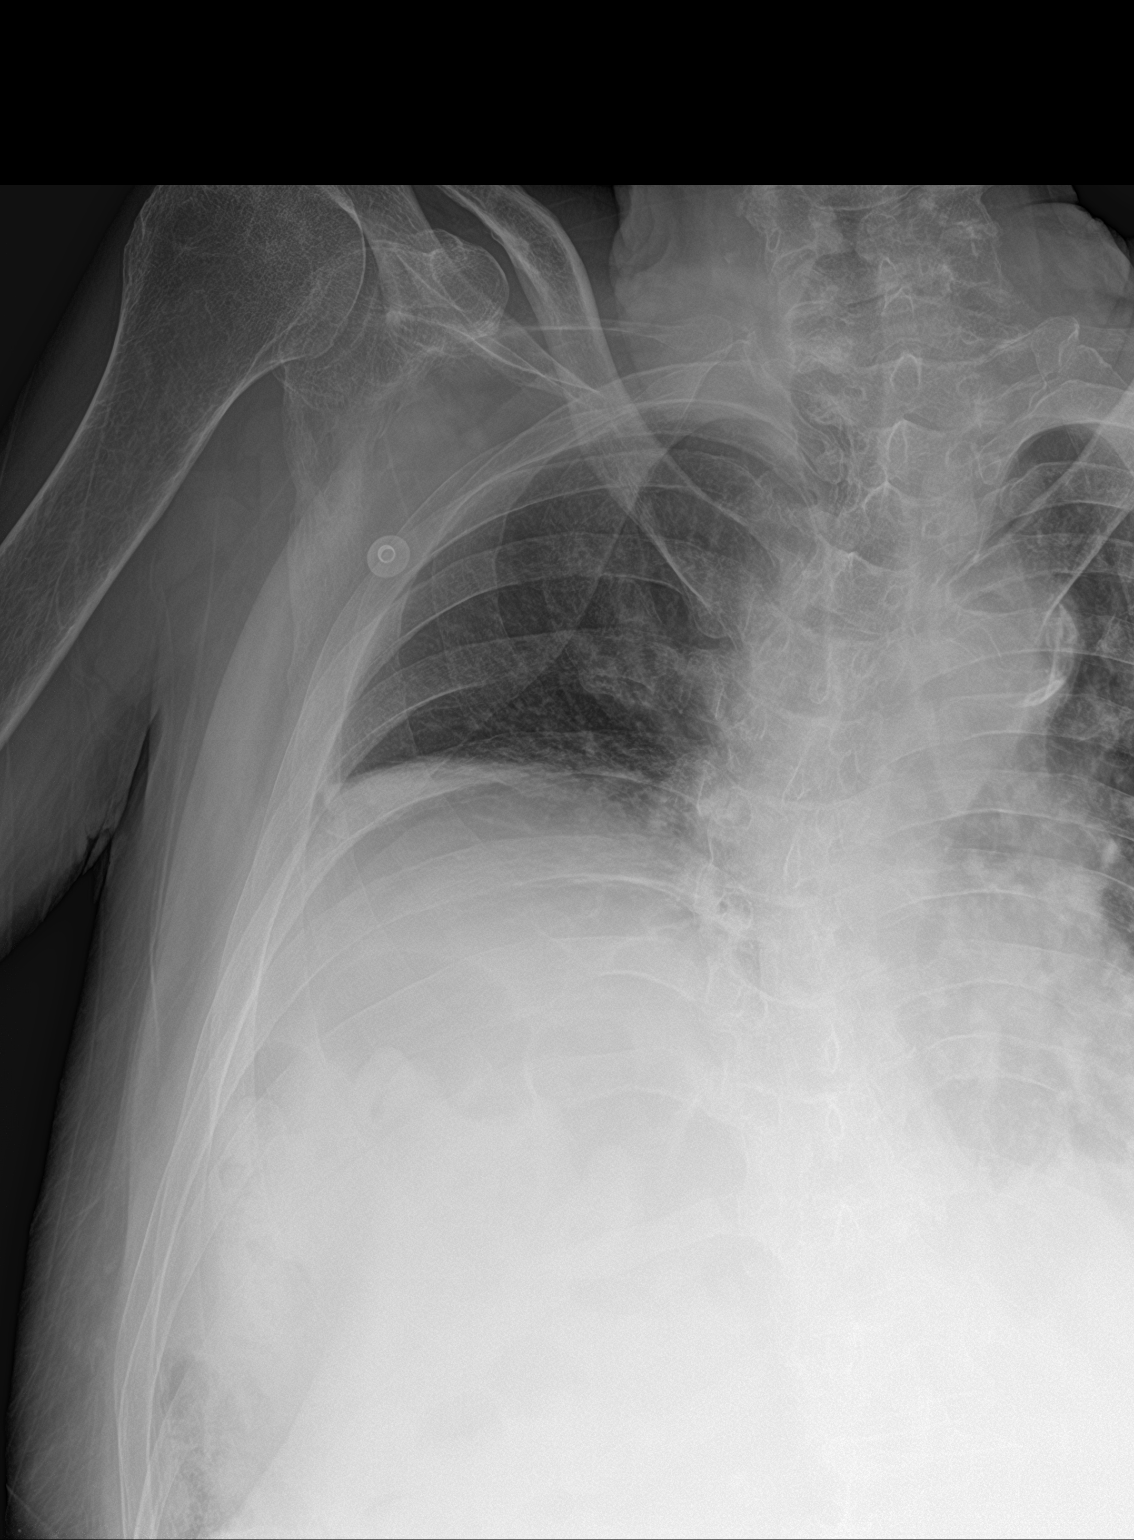

[rib ap obl]
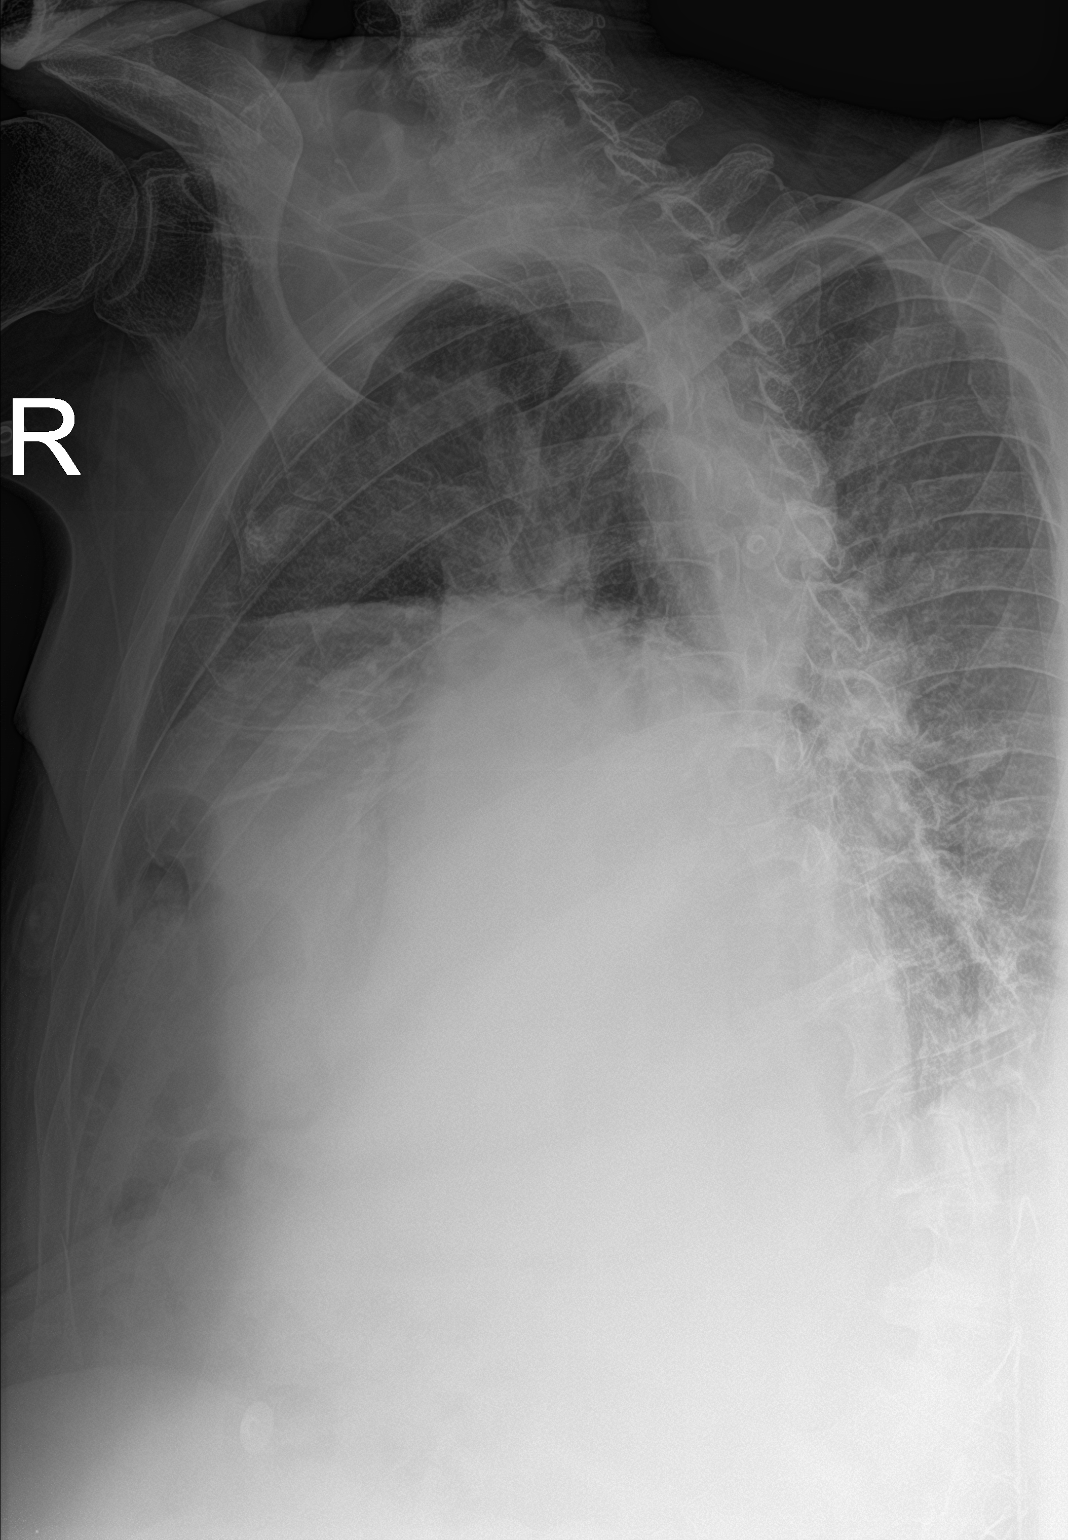

[3 of 3 positions shown; findings below may reference images not displayed]

FINDINGS: Unchanged, chronically elevated right hemidiaphragm with right
basilar atelectasis. No new focal airspace consolidation. No visible
pneumothorax unchanged cardiomediastinal silhouette with aortic
calcification. There is no evidence of displaced rib fracture.
IMPRESSION: No evidence of displaced rib fracture. Unchanged, chronically
elevated right hemidiaphragm with right basilar atelectasis.

## 2021-09-19 IMAGING — RF DG CHOLANGIOGRAM OPERATIVE
1 series · 1 of 1 positions shown · non-contrast
Comparison: None.

CLINICAL DATA: 74-year-old male with cholecystitis

EXAM:
INTRAOPERATIVE CHOLANGIOGRAM
TECHNIQUE: Cholangiographic images from the C-arm fluoroscopic device were
submitted for interpretation post-operatively. Please see the
procedural report for the amount of contrast and the fluoroscopy
time utilized.

[Series 1: run · 1 of 1 slices shown]
[im 1/1]
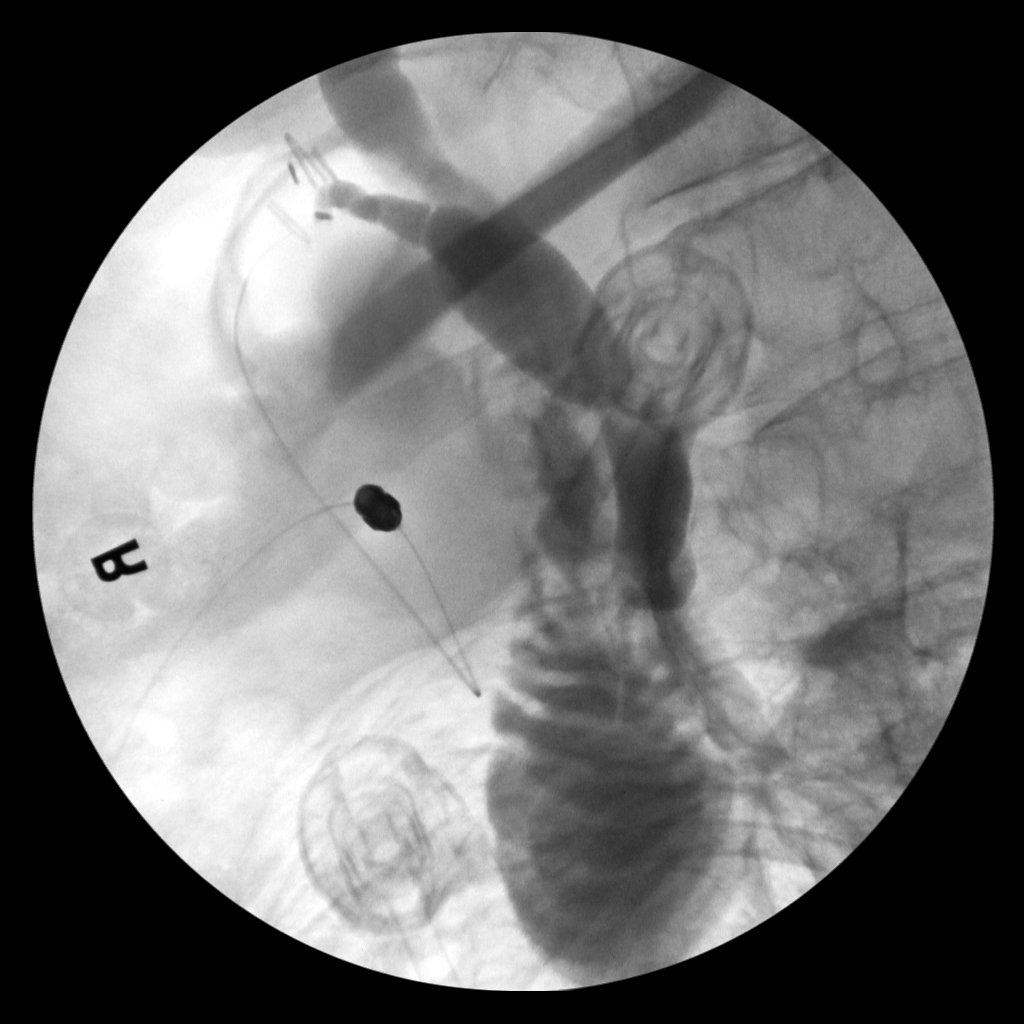

[1 of 1 positions shown; findings below may reference images not displayed]

FINDINGS: Surgical instruments project over the upper abdomen.

There is cannulation of the cystic duct/gallbladder neck, with
antegrade infusion of contrast. Caliber of the extrahepatic ductal
system is enlarged

No definite filling defect within the extrahepatic ducts identified.

Free flow of contrast across the ampulla.
IMPRESSION: Intraoperative cholangiogram demonstrates enlarged extrahepatic
biliary ducts, with no definite filling defects identified. Free
flow of contrast across the ampulla.

Please refer to the dictated operative report for full details of
intraoperative findings and procedure

## 2021-10-06 ENCOUNTER — Encounter: Payer: Self-pay | Admitting: Family Medicine

## 2021-10-06 ENCOUNTER — Other Ambulatory Visit: Payer: Medicare Other | Admitting: Family Medicine

## 2021-10-06 VITALS — BP 92/52 | HR 59 | Resp 18

## 2021-10-06 DIAGNOSIS — I959 Hypotension, unspecified: Secondary | ICD-10-CM

## 2021-10-06 DIAGNOSIS — G894 Chronic pain syndrome: Secondary | ICD-10-CM

## 2021-10-06 NOTE — Progress Notes (Signed)
? ? ?Manufacturing engineer ?Community Palliative Care Consult Note ?Telephone: [phone]  ?Fax: [phone]  ? ? ?Date of encounter: 10/06/21 ?10:19 AM ?PATIENT NAME: Brian Ruiz ?[address] ?[place] [address]   ?[phone] (home)  ?DOB: 08-24-1945 ?MRN: [id number] ?PRIMARY CARE PROVIDER:    ?Bernerd Limbo, MD,  ?[address] ?[place] [address] ?[phone] ? ?REFERRING PROVIDER:   ?Bernerd Limbo, MD ?[address] ?[address] ?[place],  [place] [address] ?[phone] ? ?RESPONSIBLE PARTY:    ?Contact Information   ? ? Name Relation Home Work Mobile  ? Treylan, Mcclintock Spouse [phone]  [phone]  ? Benjie Karvonen Daughter   [phone]  ? ?  ? ? ? ?I met face to face with patient and wife in their home.  Palliative Care was asked to follow this patient by consultation request of  Bernerd Limbo, MD to address advance care planning and complex medical decision making. This is a follow up visit. ? ?                             ASSESSMENT, SYMPTOM MANAGEMENT AND PLAN / RECOMMENDATIONS:  ?  Hypotension ?Accompanied by mild bradycardia. ?Decrease Carvedilol to 25 mg daily, hold if 6 sec pulse </= 5. ?Increase fluid intake   ? ?  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        DDD, osteoarthritis of bilateral hips and spondylolisthesis ?Change Dilaudid 2 mg po q 8 hours to q 6 hrs prn pain.   ?Pain goal 5 or less.  ? ? ? ?Advance Care Planning/Goals of Care: Goals include to maximize quality of life and symptom management. Defer discussion until pain better controlled. ?CODE STATUS: ?Full code at present ? ? ? ?Follow up Palliative Care Visit: Palliative care will continue to follow for complex medical decision making, advance care planning, and clarification of goals. Return 4 weeks or prn. ? ? ?This visit was coded based on medical decision making (MDM). ? ?PPS: 50% ? ?HOSPICE ELIGIBILITY/DIAGNOSIS: TBD ? ?Chief Complaint:  ?Palliative Care is  following to help with medical management of chronic pain with pain goal of 5 or less.  Pt does not want significant increase in pain meds. ? ?HISTORY OF PRESENT ILLNESS:  [name] is a 76 y.o. 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  He is having wearing off at about 6 hours and has dramatic increase in pain from 7 on 10 scale to 9-10/10 scale.  He is taking scheduled Tylenol in between Dilaudid.  Denies palpitations, CP, SOB, has intermittent lightheadedness. Denies falls, has had some difficulty with constipation but is currently improved on current bowel regimen.  Denies nausea and vomiting. Wife has noted some incidental tremors that have increased pt's difficulty with feeding himself or drinking.  Pt is not a candidate for surgical intervention due to severe osteoporosis and is unwilling to take medications for osteoporosis due to side effects.  Does not want to take Gabapentin in addition to Dilaudid "because it doesn't work".  Have explained the potentiation effects of Gabapentin and control of neuropathic elements of pain but pt remains insistent he does not want to try again. ? ?History obtained from review of EMR, discussion with wife and/or Mr. Kisling.  ?I reviewed available  labs, medications, imaging, studies and related documents from the EMR.  Records reviewed and summarized above.  ? ?ROS ?General: NAD ?EYES: denies vision changes ?ENMT: denies dysphagia ?Cardiovascular: denies chest pain, denies DOE ?Pulmonary: denies cough, denies increased SOB ?Abdomen: endorses fair appetite, endorses some constipation but reports good on current bowel regimen, endorses continence of bowel ?GU: denies dysuria, endorses continence of urine ?MSK:  endorses increased gait instability,  intentional tremors, no falls reported ?Skin:  denies rashes or wounds ?Neurological: endorses pain of 7-8/10 scale in bilat hips/low back and knees, denies insomnia ?Psych: Endorses depressed mood ?Heme/lymph/immuno: denies bruises, abnormal bleeding ? ?Physical Exam: ?Constitutional: NAD, appears chronically ill ?General: frail appearing, thin ?EYES: anicteric sclera, lids intact, no discharge  ?ENMT: intact hearing, oral mucous membranes moist, dentition intact ?CV: S1S2, RRR, trace pedal edema bilat ?Pulmonary: CTAB, no increased work of breathing, no cough, room air ?Abdomen:normo-active BS + 4 quadrants, soft and non tender, no ascites ?GU: deferred ?MSK: no sarcopenia, moves all extremities, ambulatory with unsteady gait/walker ?Skin: warm and dry, no rashes or wounds on visible skin ?Neuro:  no generalized weakness, no cognitive impairment ?Psych:  blunted affect, A and O x 3 ?Hem/lymph/immuno: no widespread bruising ? ? ?Thank you for the opportunity to participate in the care of Mr. Summer.  The palliative care team will continue to follow. Please call our office at [phone] if we can be of additional assistance.  ? ?Marijo Conception, FNP -C ? ?COVID-19 PATIENT SCREENING TOOL ?Asked and negative response unless otherwise noted:  ? ?Have you had symptoms of covid, tested positive or been in contact with someone with symptoms/positive test in the past 5-10 days? No ? ?

## 2021-10-11 ENCOUNTER — Encounter: Payer: Self-pay | Admitting: Family Medicine

## 2021-10-18 ENCOUNTER — Other Ambulatory Visit: Payer: Medicare Other | Admitting: Family Medicine

## 2021-10-18 ENCOUNTER — Encounter: Payer: Self-pay | Admitting: Family Medicine

## 2021-10-18 VITALS — BP 130/76 | HR 67

## 2021-10-18 DIAGNOSIS — G894 Chronic pain syndrome: Secondary | ICD-10-CM

## 2021-10-18 DIAGNOSIS — M25551 Pain in right hip: Secondary | ICD-10-CM

## 2021-10-18 DIAGNOSIS — I959 Hypotension, unspecified: Secondary | ICD-10-CM

## 2021-10-18 DIAGNOSIS — M4317 Spondylolisthesis, lumbosacral region: Secondary | ICD-10-CM

## 2021-10-18 DIAGNOSIS — M810 Age-related osteoporosis without current pathological fracture: Secondary | ICD-10-CM

## 2021-10-18 DIAGNOSIS — Z515 Encounter for palliative care: Secondary | ICD-10-CM

## 2021-10-18 DIAGNOSIS — F418 Other specified anxiety disorders: Secondary | ICD-10-CM

## 2021-10-18 NOTE — Progress Notes (Signed)
[facility] Palliative Care Consult Note Telephone: [phone]  Fax: [phone]    Date of encounter: 10/18/21 12:00 PM PATIENT NAME: Brian Ruiz [address] [address]   [phone] (home)  DOB: 22-Sep-1945 MRN: [id number] PRIMARY CARE PROVIDER:    Bernerd Limbo, MD,  [address] [place] [address] [phone]  REFERRING PROVIDER:   Bernerd Limbo, MD [address] [place],  [place] [address] [phone]  RESPONSIBLE PARTY:    Contact Information     Name Relation Home Work [name] [phone]  [phone]   [name]   [phone]        I met face to face with patient and wife in their home. Palliative Care was asked to follow this patient by consultation request of  Bernerd Limbo, MD to address advance care planning and complex medical decision making. This is a follow up visit.                                   ASSESSMENT, SYMPTOM MANAGEMENT AND PLAN / RECOMMENDATIONS:   Chronic Pain Syndrome secondary to spondylolisthesis of lumbosacral region, bilateral hip OA/DJD, osteoporosis Increase Dilaudid to 4 mg every 4-6 hrs prn pain. Continue schedule Tylenol 1000 mg TID. Add Gabapentin 100 mg QHS, Encourage patient to modify activity with use of lift chair to get up and down from seated position. Use of urinal at times to prevent frequent trips to the bathroom and conserve wear and tear on hips, although encouraged to move some every few hours. Educated that Gabapentin is used to potentiate opiod relief.   2.    Hypotension BP elevated initially but pt in pain and had just ambulated to the bathroom and back. On repeat BP after Carvedilol given, BP normal range. Continue Carvedilol 25 mg daily, with next fill may change to 12.5 mg BID if continues to remain stable.  3.    Depression with Anxiety Self weaned off Zoloft with c/o SOB  which he has had previously. Believe that pt will have some reduction in symptoms if pain better controlled.  4.    Palliative Care Encounter Pt resistant to trying combinations and fears addiction. Advised that pain increase may not mean tolerance or that pain med is ineffective but with degree of severe osteoporosis he could be at risk for spontaneous fracture with sudden pain increase. Discussed tips for energy conservation and prevention of joint wear/tear.       Follow up Palliative Care Visit: Palliative care will continue to follow for complex medical decision making, advance care planning, and clarification of goals. Return 2 weeks or prn.   This visit was coded based on medical decision making (MDM).  PPS: 50%  HOSPICE ELIGIBILITY/DIAGNOSIS: TBD  Chief Complaint:  Palliative Care is following up for review of pain management and hypertension with recent hypotension.  HISTORY OF PRESENT ILLNESS:  Brian Ruiz is a 76 y.o. 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 Last visit pt was notably hypotensive with mild bradycardia so his dose of Carvedilol was adjusted to be 25 mg daily instead of BID.  We also increased his frequency of Dilaudid to Q 6 hrs prn.  AM following up today.  Pt self describes as irritable and "I have been acting like an ass but today is not my best day."  He speaks of not having good pain relief beyond an hour and a half or two in the last 2 weeks although he took the Dilaudid 2 mg QID for 8 days.  He states this is a common problem he has had before and he may need to just "stop taking the Dilaudid altogether." He has been resistant to use of a lift chair that his wife and daughter have been trying to get him to use to ease the burden of getting up and down on his arms, hips and back.   He states he tapered off Zoloft over 30 days and quit.  No falls. H but e has been very resistive to other methods like Gabapentin and Lidoderm to augment the opiod pain meds but states he would like to get my input.  Advised pt that he is already irritable whether from stopping SSRI.  Educated that given his severity of osteoporosis he is not only at risk for fracture from a fall but could conceivably have a spontaneous fracture.  He denies sudden increase in any one pain just overall increase in the areas he already has pain-both hips, low back, both shoulders. Advised that with degenerative or wear and tear arthritis he may benefit from some energy conservation by using a urinal some, using the lift chair or a wheelchair for mobility part of the time.  Advised he still needs to be up and moving some but this may help his pain, give him better control and cause less damage to the joints. Also advised his mood is likely to improve with improved pain control and strongly encouraged him to use the Gabapentin at HS but also advised we may need to titrate up the dose.  He is agreeable to try.  Initially his BP was elevated and he was advised to go ahead and take his Carvedilol which he had been taking at night.  Encouraged him to change to daytime to cover him when he is likely to be more active and allow better BP control.  History obtained from review of EMR, discussion with wife and/or Brian Ruiz.  I reviewed available labs, medications, imaging, studies and related documents from the EMR.  Records reviewed and summarized above.   ROS  General: NAD, appears uncomfortable EYES: denies vision changes ENMT: denies dysphagia Cardiovascular: denies chest pain, denies DOE Pulmonary: has non-productive cough, denies increased SOB, SOB has improved since stopping Zoloft Abdomen: endorses good appetite, denies constipation, endorses continence of bowel GU: denies dysuria, endorses continence of urine MSK:   denies increased weakness, no falls reported Skin: denies rashes or wounds Neurological: endorses pain and insomnia Psych: Endorses depressed mood Heme/lymph/immuno: denies bruises, abnormal bleeding  Physical Exam: Current and past weights: Constitutional: NAD General: frail appearing, thin/WNWD/obese  EYES: anicteric sclera, lids intact, no discharge  ENMT: intact hearing, oral mucous membranes moist, dentition intact CV: S1S2, RRR, no LE edema Pulmonary: LCTA, no increased work of breathing, no cough, room air Abdomen: normo-active BS + 4 quadrants, soft and non tender, no ascites GU: deferred MSK: no sarcopenia, moves all extremities, ambulatory with rolling walker Skin: warm and dry, no rashes or wounds on visible skin Neuro:  no generalized weakness,  no cognitive impairment Psych: non-anxious affect, A and O x 3 Hem/lymph/immuno: no widespread bruising   Thank you for the opportunity to participate in the care of Brian Ruiz.  The palliative care team will continue to follow. Please call our office at [phone] if we can be of additional assistance.   Marijo Conception, FNP -C  COVID-19 PATIENT SCREENING TOOL Asked and negative response unless otherwise noted:   Have you had symptoms of covid, tested positive or been in contact with someone with symptoms/positive test in the past 5-10 days?  N0

## 2021-11-04 ENCOUNTER — Other Ambulatory Visit: Payer: Medicare Other | Admitting: Family Medicine

## 2021-11-04 ENCOUNTER — Encounter: Payer: Self-pay | Admitting: Family Medicine

## 2021-11-04 VITALS — BP 142/80 | HR 76 | Resp 20

## 2021-11-04 DIAGNOSIS — I959 Hypotension, unspecified: Secondary | ICD-10-CM

## 2021-11-04 DIAGNOSIS — M4317 Spondylolisthesis, lumbosacral region: Secondary | ICD-10-CM

## 2021-11-04 DIAGNOSIS — M159 Polyosteoarthritis, unspecified: Secondary | ICD-10-CM

## 2021-11-04 DIAGNOSIS — G894 Chronic pain syndrome: Secondary | ICD-10-CM

## 2021-11-04 DIAGNOSIS — M150 Primary generalized (osteo)arthritis: Secondary | ICD-10-CM | POA: Insufficient documentation

## 2021-11-04 DIAGNOSIS — F411 Generalized anxiety disorder: Secondary | ICD-10-CM

## 2021-11-04 NOTE — Progress Notes (Signed)
Therapist, nutritional Palliative Care Consult Note Telephone: [phone]  Fax: [phone]    Date of encounter: 11/04/21 11:58 AM PATIENT NAME: Brian Ruiz [address] [place] [address]   [phone] (home)  DOB: 10/06/1945 MRN: [id number] PRIMARY CARE PROVIDER:    Tracey Harries, MD,  [address] [place] [address] [phone]  REFERRING PROVIDER:   Tracey Harries, MD [address] [place],  [place] [address] [phone]  RESPONSIBLE PARTY:    Contact Information     Name Relation Home Work Brian Ruiz [phone]  [phone]   Brian Ruiz   [phone]        I met face to face with patient and wife in their home. Palliative Care was asked to follow this patient by consultation request of  Brian Harries, MD to address advance care planning and complex medical decision making. This is a follow up visit.                                   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 Stopped Gabapentin after single dose as did not life the way it made him feel. Willing to try Fentanyl transdermal patch, aware of possible need to titrate and for now will take Hydromorphone 2 mg Q 4-6 hrs prn for breakthrough.  After 24 hours will increase interval between and change to Hydromorphone every 6-8 hours prn for breakthrough pain. Believe current O2 sat decreased due to opiod sedation. Has prescription for Narcan nasal spray prn for opiod overdose. Continue schedule Tylenol 1000 mg TID. Pending delivery of lift chair House is not handicapped accessible, plan is for them to move to [place] to senior housing to be near their  daughter.   2.    Hypotension Resolved. Currently resumed Carvedilol 25 mg BID with normal BP today and slight HR irregularity. Continue current dose, monitor BP.  3.    Depression with Anxiety Noted spike in anxiety and restarted Zoloft 25 mg daily without adverse effects.      Follow up Palliative Care Visit: Palliative care will continue to follow for complex medical decision making, advance care planning, and clarification of goals. Follow up call on Monday. Return 4 weeks or prn.   This visit was coded based on medical decision making (MDM).  PPS: 40%  HOSPICE ELIGIBILITY/DIAGNOSIS: TBD  Chief Complaint:  Palliative scheduled acute follow up visit to address chronic pain and pain management.  HISTORY OF PRESENT ILLNESS:  Brian Ruiz is a 76 y.o. 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 and down on his arms, hips and back.  Pt's BP has gone up and he was having some palpitations so he resumed the 2nd daily dose of Carvedilol.  He states that the 4 mg Dilaudid tabs are only making him sleepy which is the only time he has relief but he has been sleeping much more.  He denies falls or injury and pain is in multiple joints-hips, knees, low back and shoulder. He is debating on  stopping Dilaudid altogether and whether or not he should rethink about his refusal to consider the Fentanyl pain patch.  He states having taken 1 Gabapentin and he felt "loopy" so he just stopped them altogether.  He has the lift chair being delivered as a rental so he can ensure that this is the chair he wants before making the purchase. He and his wife are moving to a senior living apartment in [place] to be near their daughter. We discussed starting a very low dose fentanyl patch 12 mcg/hr and using the remaining Dilaudid 2 mg tabs every  4-6 hours prn for breakthrough pain to see what dose he may need.  Will need to titrate slowly.   History obtained from review of EMR, discussion with wife and/or Brian Ruiz.  I reviewed available labs, medications, imaging, studies and related documents from the EMR.  Records reviewed and summarized above.   ROS  General: NAD, appears uncomfortable EYES: denies vision changes ENMT: denies dysphagia Cardiovascular: denies chest pain, denies DOE Pulmonary: has non-productive cough, denies increased SOB Abdomen: endorses fair appetite, denies constipation, endorses continence of bowel GU: denies dysuria, endorses continence of urine MSK:  denies increased weakness, no falls reported Skin: denies rashes or wounds Neurological: endorses pain and insomnia Psych: Endorses depressed mood Heme/lymph/immuno: denies bruises, abnormal bleeding  Physical Exam: Constitutional: NAD, appears very uncomfortable General: frail appearing, thin EYES: anicteric sclera, lids intact, no discharge  ENMT: intact hearing, oral mucous membranes moist, dentition intact CV: S1S2, RRR with occasional irregular beat, trace pedal edema and slightly cyanotic to feet. Pulmonary: CTAB, no increased work of breathing, no cough, room air Abdomen: normo-active BS + 4 quadrants, soft and non tender, no ascites GU: deferred MSK: generalized loss of muscle mass and subcu fat in all 4 limbs, moves all extremities, ambulatory with rolling walker Skin: warm and dry, no rashes or wounds on visible skin Neuro:  no generalized weakness,  no cognitive impairment Psych: non-anxious affect, A and O x 3 Hem/lymph/immuno: no widespread bruising   Thank you for the opportunity to participate in the care of Brian Ruiz.  The palliative care team will continue to follow. Please call our office at [phone] if we can be of additional assistance.   Lurline Idol, FNP -C  COVID-19 PATIENT SCREENING TOOL Asked and negative  response unless otherwise noted:   Have you had symptoms of covid, tested positive or been in contact with someone with symptoms/positive test in the past 5-10 days?  N0

## 2021-11-06 ENCOUNTER — Encounter: Payer: Self-pay | Admitting: Family Medicine

## 2022-02-07 ENCOUNTER — Encounter: Payer: Self-pay | Admitting: Family Medicine

## 2022-02-07 ENCOUNTER — Other Ambulatory Visit: Payer: Medicare Other | Admitting: Family Medicine

## 2022-02-07 VITALS — BP 112/60 | HR 70 | Resp 18

## 2022-02-07 DIAGNOSIS — M4317 Spondylolisthesis, lumbosacral region: Secondary | ICD-10-CM

## 2022-02-07 DIAGNOSIS — M159 Polyosteoarthritis, unspecified: Secondary | ICD-10-CM

## 2022-02-07 DIAGNOSIS — G894 Chronic pain syndrome: Secondary | ICD-10-CM

## 2022-02-07 NOTE — Progress Notes (Signed)
[facility] Consult Note Telephone: [phone]  Fax: [phone]    Date of encounter: 02/07/22 11:17 AM PATIENT NAME: Brian Ruiz [address] [address]   [phone] (home)  DOB: 1946/05/25 MRN: [id number] PRIMARY CARE PROVIDER:    Bernerd Limbo, MD,  [address] [place] [address] [phone]  REFERRING PROVIDER:   Bernerd Limbo, MD [place],  [place] [address] [phone]  RESPONSIBLE PARTY:    Contact Information     Name Relation Home Work [place] [phone]  [phone]   [name]   [phone]        I met face to face with patient and wife in their home. Palliative Care was asked to follow this patient by consultation request of  Bernerd Limbo, MD to address advance care planning and complex medical decision making. This is a follow up visit.                                   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 On day 3 of patch, start Dilaudid 2 mg po and take 2nd dose in 6 hours. Can take Q 6 hrs prn. Continue Tylenol in between as adjunct. Use heating pad 15 minutes on/30 minutes off. Try progressive relaxation breathing exercise. Energy conservation-prepping for shower the night he puts on patch by laying out clothes, showering the next am when his pain medication is fully effective. Pacing activities like not shaving on the day he showers, using an electric razor to shave while seated with a mirror. https://www.bfwh.nhs.uk/wp-content/uploads/2018/02/PL721.pdf has energy conservation tips       Follow up Palliative Care Visit: Palliative care will continue to follow for complex medical decision  making, advance care planning, and clarification of goals. Follow up call on Monday. Return 4 weeks or prn.   This visit was coded based on medical decision making (MDM).  PPS: 40%  HOSPICE ELIGIBILITY/DIAGNOSIS: TBD  Chief Complaint:  Palliative Care is continuing to follow for chronic medical management of chronic pain in setting of widespread arthritis with inability to intervene surgically.  HISTORY OF PRESENT ILLNESS:  Brian Ruiz is a 76 y.o. 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 and down on his arms, hips and back.  Pt's BP has gone up and he was having some palpitations so he resumed the 2nd daily dose of Carvedilol.  He states that the 4 mg Dilaudid tabs are only making him sleepy which is the only time he has relief but he has been sleeping much more.  He denies falls.  He is having worsening pain in left hip and left shoulder.  He does have a lift chair which is being helpful.  First 2 days with Fentanyl are tolerable. Pain is a 7 on the 2 days and gradually builds up and pain is a 10+ on the 3rd day.  Wife is going to have cataract surgery in 2 weeks on one eye then 2-3 weeks later on the other. Memory is more affected and he is sleeping more.  Discussed lipophilic nature of Fentanyl with pt not having a lot of body fat and possible increase to changing patch q 2 days.  At this time pt does not want to try that and is even considering the possibility of weaning off and stopping his fentanyl.  He does not want to increase dose of fentanyl but wants an option to cover the 3rd day.  He still has 16 tabs of Dilaudid 2 mg and was advised on day 3 he could take 1 tab q6 hrs for breakthrough pain as needed.  Advised him to take one Dilaudid when he awakened in the am on 3rd day, to take a 2nd after 6 hours and then if  he needed additional dose he could take 6 hours later.  Discussed use of adjunctive therapies to help get pain manageable:  TENS unit, heat/ice, massage, position change and that other medications like Gabapentin or Lyrica may help with pain control.  He asked if this provider thought he could be successful if weaned off Fentanyl.  Explained to pt that he has a chronic, debilitating disease that is going to naturally progress over time and I do not think it is realistic to expect that he will wean off Fentanyl without some other type of chronic pain meds as the wear and tear on his joints will continue with living everyday life. Advised he will likely need a layered approach to control his pain and that he may have to be willing to make some trade-offs-for example, increased sleepiness or confusion. He has tried multiple things before in isolation that either were helpful and stopped being helpful or were not particularly helpful alone.  Advised that some of the things he has tried previously help potentiate his medication so he doesn't need more to get more relief. He verbalized understanding and frustration with at times inability "to remember things for 5 minutes or how to work the remote control."  Advised that the layered approach would help give him more degrees of relief, for instance, biofeedback, heat, guided imagery, Gabapentin, energy conservation techniques and fentanyl might get his pain to a 5-6 which is manageable where each one in isolation will not.  Advised wife it is not uncommon for pt's to have difficulty accepting suggestions because the chronic pain is wearying and this gives the patient a little control.  Reinforced understanding that it would be terribly difficult to watch someone you care about hurt and be unable to do anything.   History obtained from review of EMR, discussion with wife and/or Brian Ruiz.  I reviewed EMR and there are no new available labs, medications, imaging, studies  or related documents.   ROS  General: NAD Cardiovascular: denies chest pain, denies DOE Pulmonary:  denies cough, denies increased SOB Abdomen: endorses fair appetite, denies constipation, endorses continence of bowel GU: denies dysuria, endorses continence of urine MSK:  denies increased weakness, no falls reported.  Increasing pain in affected joints particularly of left side Skin: skin irritation and itching at prior fentanyl patch site Neurological: endorses pain worse on 3rd day of fentanyl patch and insomnia Psych: Endorses depressed mood and confusion Heme/lymph/immuno: denies bruises, abnormal bleeding  Physical Exam: Constitutional: NAD, appears uncomfortable General: frail appearing, thin CV: S1S2, RRR with occasional irregular beat, no edema  Pulmonary: CTAB, no increased work of breathing, no cough, room air Abdomen: normo-active BS + 4 quadrants, soft and non tender MSK: generalized loss of muscle mass and subcu fat in all 4 limbs, moves all  extremities, ambulatory with rolling walker Skin: warm and dry, no rashes or wounds on visible skin Neuro:  no generalized weakness,  noted short term memory loss Psych: non-anxious affect, A and O x 3.  Perseverates on need for pain medications and loss of control Hem/lymph/immuno: no widespread bruising   Thank you for the opportunity to participate in the care of Brian Ruiz.  The palliative care team will continue to follow. Please call our office at [phone] if we can be of additional assistance.   Marijo Conception, FNP -C  COVID-19 PATIENT SCREENING TOOL Asked and negative response unless otherwise noted:   Have you had symptoms of covid, tested positive or been in contact with someone with symptoms/positive test in the past 5-10 days?  N0

## 2022-03-07 ENCOUNTER — Other Ambulatory Visit: Payer: Medicare Other | Admitting: Family Medicine

## 2022-03-07 ENCOUNTER — Encounter: Payer: Self-pay | Admitting: Family Medicine

## 2022-03-07 DIAGNOSIS — F418 Other specified anxiety disorders: Secondary | ICD-10-CM

## 2022-03-07 DIAGNOSIS — G894 Chronic pain syndrome: Secondary | ICD-10-CM

## 2022-03-07 DIAGNOSIS — M159 Polyosteoarthritis, unspecified: Secondary | ICD-10-CM

## 2022-03-07 DIAGNOSIS — M4317 Spondylolisthesis, lumbosacral region: Secondary | ICD-10-CM

## 2022-03-07 DIAGNOSIS — Z515 Encounter for palliative care: Secondary | ICD-10-CM

## 2022-03-07 NOTE — Progress Notes (Signed)
[facility] Consult Note Telephone: [phone]  Fax: [phone]    Date of encounter: 03/07/22 11:13 AM PATIENT NAME: Brian Ruiz [address] [address]   [phone] (home)  DOB: April 27, 1946 MRN: [id number] PRIMARY CARE PROVIDER:    Bernerd Limbo, MD,  [address] [place] [address] [phone]  REFERRING PROVIDER:   Bernerd Limbo, MD [place],  [place] [address] [phone]  RESPONSIBLE PARTY:    Contact Information     Name Relation Home Work [place] [phone]  [phone]   [name]   [phone]        I met face to face with patient and wife in their home. Palliative Care was asked to follow this patient by consultation request of  Bernerd Limbo, MD to address advance care planning and complex medical decision making. This is a follow up visit.                                   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 Will forward note with recommendations to PCP. Continue Fentanyl 25 mcg/hr patch, change Q 3 days. On evening of day 2 of patch, Take Dilaudid 2 mg po at HS, on day 3 can take q 8 hrs prn (sent 30 tabs to pharmacy for fill 03/21/25) If above regimen is ineffective, would consider Fentanyl patch 25 mcg, change Q 48 hours and drop the Dilaudid.  RX sent for patch change q 48 hours with 15 patches per month (fill on 03/10/22) Continue Tylenol in between as adjunct. Use heating pad 15 minutes on/30 minutes off. Try progressive relaxation breathing exercise or music therapy to decrease pain by decreasing anxiety. Advised to consider PT/OT evaluation for ways to ambulate/do ADLs with less pain/stress on joints  and for generalized pain relief (pt declines at present)  2.  Palliative Care Encounter Advised pt that Palliative Care is realigning services and this provider will not be able to make home visits any further. Advised full determination on patients to be kept has not been made yet but further follow up will be by nursing/SW clinicians. Advised to contact PCP since he is homebound to see if PCP can prescribe medications including pain meds. Advise another option would be with pain clinic but there is significant concern that pt may not be able to get in the car to go to visits. Pt and wife considering move to ALF close to their daughter in [place] but wife having cataract surgery on 03/18/22 and wants to get that done before accepting placement.  3.  Depression and Anxiety Encouraged pt to try to refocus thoughts when starting to worry about pain coming back and lack of sleep.   Encouraged to also look at non-medicinal methods to manage pain/anxiety-heat, progressive relaxation, deep breathing, music therapy to distract. Continues on Sertraline 25 mg daily Adjustments in pain regimen to address wearing off.     Follow up Palliative Care Visit: Palliative care will continue to follow with nursing/SW clinicians to address medical and psychosocial needs.   This visit was coded based on medical decision making (MDM).  PPS: 40%  HOSPICE ELIGIBILITY/DIAGNOSIS: TBD  Chief Complaint:  Palliative Care is  following for chronic medical management of pain in spondylolisthesis and multiple joint pain.  HISTORY OF PRESENT ILLNESS:  Brian Ruiz is a 76 y.o. 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  He is having worsening pain in left hip and left shoulder.   First 2 days with Fentanyl are tolerable. Pain is a 7 on the 2  days and gradually builds up and pain is a 10+ on the 3rd day. Wife states he starts becoming very anxious on 2nd day stating he knows the pain is coming back and he will not sleep because he is so uncomfortable.  Pt and spouse proposed him taking a Dilaudid at bedtime on day 2 and then taking most likely 1-2 on day 3 Q 8 hours.  Discussed lipophilic nature of Fentanyl with pt not having a lot of body fat and possible increase to changing patch q 2 days. Encouraged pt to find other non-medicinal ways to combat pain and distract his thoughts.  Advised the more tense and anxious he is the more pain he will suffer.  He has been in fear of using too much medication and chronically asks if this provider thinks he can wean off meds. Advised pt that he has safeguards in place:  The fact that he expresses such concern makes him more aware of that possibility.  His wife is also the person who controls and dispenses his medications when needed and she is alert to his pain needs but not willing to allow him to take too much medicine.  Explained to pt again that he has a chronic, debilitating disease that is going to naturally progress over time and I do not think it is realistic to expect that he will wean off Fentanyl without some other type of chronic pain meds as the wear and tear on his joints will continue with living everyday life. Advised he will likely need a layered approach to control his pain so things that may not have been helpful in isolation like Gabapentin or TENS would likely help with some degree of pain reduction. Wife states he woke up one day in the last 6 weeks not feeling well and being lightheaded/dizzy while laying in bed.  She got him some water, gave him some OJ and waited a few hours for him to try to get up.  On getting up and taking a few steps, pt became lightheaded and fell against the wall which kept him upright. She said he has had no further episodes of this but his hypotension and  bradycardia were in part why his Coreg dose was decreased to 25 mg daily with breakfast.   History obtained from review of EMR, discussion with wife and/or [name].  I reviewed EMR and there are no new available labs, medications, imaging, studies or related documents.   ROS  General: NAD Cardiovascular: denies chest pain, denies DOE Pulmonary:  denies cough, denies SOB Abdomen: endorses fair appetite, denies constipation, endorses continence of bowel GU: denies dysuria, endorses continence of urine MSK:  denies increased weakness, no falls reported.  Increasing pain in affected joints particularly of left side and low back Neurological: endorses pain worse  starting on evening of day 2 with no significant relief on 3rd day of fentanyl patch until Dilauded given for breakthrough on day 2 and insomnia Psych: Endorses depressed mood and confusion  Heme/lymph/immuno: denies bruises, abnormal bleeding  Physical Exam: Constitutional: NAD, appears uncomfortable General: frail appearing, thin CV: no visible cyanosis Pulmonary:  no visible increased work of breathing, no cough, room air Abdomen: no abdominal distension noted MSK: generalized loss of muscle mass and subcu fat in all 4 limbs, moves all extremities, ambulatory with rolling walker Skin: warm and dry, no rashes or wounds on visible skin Neuro:  no generalized weakness,  noted short term memory loss/confusion intermittently (particularly at night per wife) Psych: moderately anxious affect, A and O x 3.   Hem/lymph/immuno: no widespread bruising   Thank you for the opportunity to participate in the care of Mr. Warmuth.  The palliative care team will continue to follow. Please call our office at [phone] if we can be of additional assistance.   Marijo Conception, FNP -C  COVID-19 PATIENT SCREENING TOOL Asked and negative response unless otherwise noted:   Have you had symptoms of covid, tested positive or been in contact with  someone with symptoms/positive test in the past 5-10 days?  No

## 2022-03-24 ENCOUNTER — Telehealth: Payer: Self-pay | Admitting: Family Medicine

## 2022-03-24 NOTE — Telephone Encounter (Signed)
Pt's spouse Dorian Pod called to say that Dr Coletta Memos has not received Palliative Care Notes.  Faxed  Palliative notes from 09/09/21, 10/06/21, 10/18/21, 11/04/21 to Dr Coletta Memos via [facility].  Damaris Hippo FNP-C

## 2023-08-28 ENCOUNTER — Inpatient Hospital Stay (HOSPITAL_COMMUNITY)
Admission: EM | Admit: 2023-08-28 | Discharge: 2023-09-08 | DRG: 177 | Disposition: A | Discharge status: Skilled Nursing Facility | Attending: Internal Medicine | Admitting: Internal Medicine

## 2023-08-28 ENCOUNTER — Emergency Department (HOSPITAL_COMMUNITY)

## 2023-08-28 ENCOUNTER — Other Ambulatory Visit: Payer: Self-pay

## 2023-08-28 ENCOUNTER — Encounter (HOSPITAL_COMMUNITY): Payer: Self-pay | Admitting: Internal Medicine

## 2023-08-28 DIAGNOSIS — I5043 Acute on chronic combined systolic (congestive) and diastolic (congestive) heart failure: Secondary | ICD-10-CM | POA: Diagnosis present

## 2023-08-28 DIAGNOSIS — F32A Depression, unspecified: Secondary | ICD-10-CM | POA: Diagnosis present

## 2023-08-28 DIAGNOSIS — Z87891 Personal history of nicotine dependence: Secondary | ICD-10-CM

## 2023-08-28 DIAGNOSIS — I493 Ventricular premature depolarization: Secondary | ICD-10-CM | POA: Diagnosis present

## 2023-08-28 DIAGNOSIS — E872 Acidosis, unspecified: Secondary | ICD-10-CM | POA: Diagnosis present

## 2023-08-28 DIAGNOSIS — G9341 Metabolic encephalopathy: Secondary | ICD-10-CM | POA: Insufficient documentation

## 2023-08-28 DIAGNOSIS — E861 Hypovolemia: Secondary | ICD-10-CM | POA: Diagnosis present

## 2023-08-28 DIAGNOSIS — I252 Old myocardial infarction: Secondary | ICD-10-CM

## 2023-08-28 DIAGNOSIS — I2510 Atherosclerotic heart disease of native coronary artery without angina pectoris: Secondary | ICD-10-CM | POA: Diagnosis present

## 2023-08-28 DIAGNOSIS — R0902 Hypoxemia: Secondary | ICD-10-CM

## 2023-08-28 DIAGNOSIS — M549 Dorsalgia, unspecified: Secondary | ICD-10-CM | POA: Diagnosis present

## 2023-08-28 DIAGNOSIS — F1120 Opioid dependence, uncomplicated: Secondary | ICD-10-CM | POA: Diagnosis present

## 2023-08-28 DIAGNOSIS — R296 Repeated falls: Secondary | ICD-10-CM | POA: Diagnosis present

## 2023-08-28 DIAGNOSIS — I21A1 Myocardial infarction type 2: Secondary | ICD-10-CM | POA: Diagnosis not present

## 2023-08-28 DIAGNOSIS — I472 Ventricular tachycardia, unspecified: Secondary | ICD-10-CM | POA: Diagnosis present

## 2023-08-28 DIAGNOSIS — I214 Non-ST elevation (NSTEMI) myocardial infarction: Secondary | ICD-10-CM | POA: Insufficient documentation

## 2023-08-28 DIAGNOSIS — J9601 Acute respiratory failure with hypoxia: Secondary | ICD-10-CM | POA: Diagnosis not present

## 2023-08-28 DIAGNOSIS — Z8249 Family history of ischemic heart disease and other diseases of the circulatory system: Secondary | ICD-10-CM

## 2023-08-28 DIAGNOSIS — I10 Essential (primary) hypertension: Secondary | ICD-10-CM | POA: Diagnosis present

## 2023-08-28 DIAGNOSIS — K7200 Acute and subacute hepatic failure without coma: Secondary | ICD-10-CM | POA: Diagnosis not present

## 2023-08-28 DIAGNOSIS — J1282 Pneumonia due to coronavirus disease 2019: Secondary | ICD-10-CM | POA: Diagnosis present

## 2023-08-28 DIAGNOSIS — K761 Chronic passive congestion of liver: Secondary | ICD-10-CM | POA: Diagnosis present

## 2023-08-28 DIAGNOSIS — F418 Other specified anxiety disorders: Secondary | ICD-10-CM | POA: Diagnosis present

## 2023-08-28 DIAGNOSIS — Z66 Do not resuscitate: Secondary | ICD-10-CM | POA: Diagnosis present

## 2023-08-28 DIAGNOSIS — Z9049 Acquired absence of other specified parts of digestive tract: Secondary | ICD-10-CM

## 2023-08-28 DIAGNOSIS — R338 Other retention of urine: Secondary | ICD-10-CM | POA: Diagnosis present

## 2023-08-28 DIAGNOSIS — Z7901 Long term (current) use of anticoagulants: Secondary | ICD-10-CM

## 2023-08-28 DIAGNOSIS — I081 Rheumatic disorders of both mitral and tricuspid valves: Secondary | ICD-10-CM | POA: Diagnosis present

## 2023-08-28 DIAGNOSIS — I428 Other cardiomyopathies: Secondary | ICD-10-CM | POA: Diagnosis present

## 2023-08-28 DIAGNOSIS — Z79899 Other long term (current) drug therapy: Secondary | ICD-10-CM

## 2023-08-28 DIAGNOSIS — R64 Cachexia: Secondary | ICD-10-CM | POA: Diagnosis present

## 2023-08-28 DIAGNOSIS — R570 Cardiogenic shock: Secondary | ICD-10-CM | POA: Diagnosis not present

## 2023-08-28 DIAGNOSIS — E871 Hypo-osmolality and hyponatremia: Secondary | ICD-10-CM | POA: Diagnosis present

## 2023-08-28 DIAGNOSIS — Z888 Allergy status to other drugs, medicaments and biological substances status: Secondary | ICD-10-CM

## 2023-08-28 DIAGNOSIS — I130 Hypertensive heart and chronic kidney disease with heart failure and stage 1 through stage 4 chronic kidney disease, or unspecified chronic kidney disease: Secondary | ICD-10-CM | POA: Diagnosis present

## 2023-08-28 DIAGNOSIS — I5033 Acute on chronic diastolic (congestive) heart failure: Secondary | ICD-10-CM | POA: Diagnosis present

## 2023-08-28 DIAGNOSIS — U071 COVID-19: Principal | ICD-10-CM | POA: Diagnosis present

## 2023-08-28 DIAGNOSIS — F0393 Unspecified dementia, unspecified severity, with mood disturbance: Secondary | ICD-10-CM | POA: Diagnosis present

## 2023-08-28 DIAGNOSIS — I480 Paroxysmal atrial fibrillation: Secondary | ICD-10-CM | POA: Diagnosis present

## 2023-08-28 DIAGNOSIS — E890 Postprocedural hypothyroidism: Secondary | ICD-10-CM | POA: Diagnosis present

## 2023-08-28 DIAGNOSIS — Z885 Allergy status to narcotic agent status: Secondary | ICD-10-CM

## 2023-08-28 DIAGNOSIS — G8929 Other chronic pain: Secondary | ICD-10-CM | POA: Diagnosis present

## 2023-08-28 DIAGNOSIS — G894 Chronic pain syndrome: Secondary | ICD-10-CM | POA: Diagnosis present

## 2023-08-28 DIAGNOSIS — Z886 Allergy status to analgesic agent status: Secondary | ICD-10-CM

## 2023-08-28 DIAGNOSIS — Z8616 Personal history of COVID-19: Secondary | ICD-10-CM

## 2023-08-28 DIAGNOSIS — N179 Acute kidney failure, unspecified: Secondary | ICD-10-CM | POA: Diagnosis not present

## 2023-08-28 DIAGNOSIS — R627 Adult failure to thrive: Secondary | ICD-10-CM | POA: Diagnosis present

## 2023-08-28 DIAGNOSIS — I4819 Other persistent atrial fibrillation: Secondary | ICD-10-CM | POA: Diagnosis present

## 2023-08-28 DIAGNOSIS — N401 Enlarged prostate with lower urinary tract symptoms: Secondary | ICD-10-CM | POA: Diagnosis present

## 2023-08-28 DIAGNOSIS — Z6825 Body mass index (BMI) 25.0-25.9, adult: Secondary | ICD-10-CM

## 2023-08-28 DIAGNOSIS — F0394 Unspecified dementia, unspecified severity, with anxiety: Secondary | ICD-10-CM | POA: Diagnosis present

## 2023-08-28 LAB — COMPREHENSIVE METABOLIC PANEL WITH GFR
ALT: 13 U/L (ref 0–44)
AST: 21 U/L (ref 15–41)
Albumin: 4.3 g/dL (ref 3.5–5.0)
Alkaline Phosphatase: 60 U/L (ref 38–126)
Anion gap: 11 (ref 5–15)
BUN: 20 mg/dL (ref 8–23)
CO2: 24 mmol/L (ref 22–32)
Calcium: 9.5 mg/dL (ref 8.9–10.3)
Chloride: 97 mmol/L — ABNORMAL LOW (ref 98–111)
Creatinine, Ser: 1.1 mg/dL (ref 0.61–1.24)
GFR, Estimated: >60 mL/min (ref >60)
Glucose, Bld: 125 mg/dL — ABNORMAL HIGH (ref 70–99)
Potassium: 4.2 mmol/L (ref 3.5–5.1)
Sodium: 132 mmol/L — ABNORMAL LOW (ref 135–145)
Total Bilirubin: 0.6 mg/dL (ref 0.0–1.2)
Total Protein: 7.3 g/dL (ref 6.5–8.1)

## 2023-08-28 LAB — CBC WITH DIFFERENTIAL/PLATELET
Abs Immature Granulocytes: 0.02 10*3/uL (ref 0.00–0.07)
Basophils Absolute: 0 10*3/uL (ref 0.0–0.1)
Basophils Relative: 1 %
Eosinophils Absolute: 0 10*3/uL (ref 0.0–0.5)
Eosinophils Relative: 0 %
HCT: 41.5 % (ref 39.0–52.0)
Hemoglobin: 13.2 g/dL (ref 13.0–17.0)
Immature Granulocytes: 0 %
Lymphocytes Relative: 4 %
Lymphs Abs: 0.3 10*3/uL — ABNORMAL LOW (ref 0.7–4.0)
MCH: 30.6 pg (ref 26.0–34.0)
MCHC: 31.8 g/dL (ref 30.0–36.0)
MCV: 96.1 fL (ref 80.0–100.0)
Monocytes Absolute: 0.8 10*3/uL (ref 0.1–1.0)
Monocytes Relative: 10 %
Neutro Abs: 6.4 10*3/uL (ref 1.7–7.7)
Neutrophils Relative %: 85 %
Platelets: 190 10*3/uL (ref 150–400)
RBC: 4.32 MIL/uL (ref 4.22–5.81)
RDW: 12.8 % (ref 11.5–15.5)
WBC: 7.5 10*3/uL (ref 4.0–10.5)
nRBC: 0 % (ref 0.0–0.2)

## 2023-08-28 LAB — RESP PANEL BY RT-PCR (RSV, FLU A&B, COVID)  RVPGX2
Influenza A by PCR: NEGATIVE
Influenza B by PCR: NEGATIVE
Resp Syncytial Virus by PCR: NEGATIVE
SARS Coronavirus 2 by RT PCR: POSITIVE — AB

## 2023-08-28 LAB — LACTIC ACID, PLASMA: Lactic Acid, Venous: 2.2 mmol/L (ref 0.5–1.9)

## 2023-08-28 LAB — PROTIME-INR
INR: 1 (ref 0.8–1.2)
Prothrombin Time: 13.8 s (ref 11.4–15.2)

## 2023-08-28 LAB — APTT: aPTT: 28 s (ref 24–36)

## 2023-08-28 MED ORDER — ONDANSETRON HCL 4 MG PO TABS
4.0000 mg | ORAL_TABLET | Freq: Four times a day (QID) | ORAL | Status: DC | PRN
  Filled 2023-08-28: qty 1

## 2023-08-28 MED ORDER — ACETAMINOPHEN 650 MG RE SUPP: 650.0000 mg | Freq: Four times a day (QID) | RECTAL | Status: DC | PRN

## 2023-08-28 MED ORDER — ONDANSETRON HCL 4 MG/2ML IJ SOLN: 4.0000 mg | Freq: Four times a day (QID) | INTRAMUSCULAR | Status: DC | PRN

## 2023-08-28 MED ORDER — LACTATED RINGERS IV BOLUS
1000.0000 mL | Freq: Once | INTRAVENOUS | Status: AC
  Administered 2023-08-28: 1000 mL via INTRAVENOUS

## 2023-08-28 MED ORDER — ORAL CARE MOUTH RINSE: 15.0000 mL | OROMUCOSAL | Status: DC | PRN

## 2023-08-28 MED ORDER — SODIUM CHLORIDE 0.9 % IV SOLN
2.0000 g | Freq: Once | INTRAVENOUS | Status: AC
  Administered 2023-08-28: 2 g via INTRAVENOUS
  Filled 2023-08-28: qty 20

## 2023-08-28 MED ORDER — ENOXAPARIN SODIUM 40 MG/0.4ML IJ SOSY
40.0000 mg | PREFILLED_SYRINGE | INTRAMUSCULAR | Status: DC
  Administered 2023-08-29 – 2023-08-30 (×2): 40 mg via SUBCUTANEOUS
  Filled 2023-08-28 (×2): qty 0.4

## 2023-08-28 MED ORDER — SENNOSIDES-DOCUSATE SODIUM 8.6-50 MG PO TABS: 2.0000 | ORAL_TABLET | Freq: Two times a day (BID) | ORAL | Status: DC | PRN

## 2023-08-28 MED ORDER — SODIUM CHLORIDE 0.9% FLUSH
3.0000 mL | Freq: Two times a day (BID) | INTRAVENOUS | Status: DC
  Administered 2023-08-28 – 2023-09-08 (×19): 3 mL via INTRAVENOUS

## 2023-08-28 MED ORDER — LACTATED RINGERS IV SOLN: INTRAVENOUS | Status: DC

## 2023-08-28 MED ORDER — IPRATROPIUM-ALBUTEROL 0.5-2.5 (3) MG/3ML IN SOLN
3.0000 mL | Freq: Four times a day (QID) | RESPIRATORY_TRACT | Status: DC | PRN
  Administered 2023-08-30 (×2): 3 mL via RESPIRATORY_TRACT
  Filled 2023-08-28 (×2): qty 3

## 2023-08-28 MED ORDER — GUAIFENESIN ER 600 MG PO TB12
600.0000 mg | ORAL_TABLET | Freq: Two times a day (BID) | ORAL | Status: DC | PRN
  Administered 2023-08-30 (×2): 600 mg via ORAL
  Filled 2023-08-28 (×2): qty 1

## 2023-08-28 MED ORDER — ACETAMINOPHEN 325 MG PO TABS
650.0000 mg | ORAL_TABLET | Freq: Four times a day (QID) | ORAL | Status: DC | PRN
  Administered 2023-08-28 – 2023-09-02 (×4): 650 mg via ORAL
  Filled 2023-08-28 (×4): qty 2

## 2023-08-28 MED ORDER — CARVEDILOL 12.5 MG PO TABS
12.5000 mg | ORAL_TABLET | Freq: Two times a day (BID) | ORAL | Status: DC
  Administered 2023-08-28 – 2023-08-30 (×5): 12.5 mg via ORAL
  Filled 2023-08-28 (×5): qty 1

## 2023-08-28 MED ORDER — LACTATED RINGERS IV BOLUS (SEPSIS): 2000.0000 mL | Freq: Once | INTRAVENOUS | Status: DC

## 2023-08-28 MED ORDER — SODIUM CHLORIDE 0.9 % IV SOLN
500.0000 mg | Freq: Once | INTRAVENOUS | Status: AC
  Administered 2023-08-28: 500 mg via INTRAVENOUS
  Filled 2023-08-28: qty 5

## 2023-08-28 MED ORDER — SENNOSIDES-DOCUSATE SODIUM 8.6-50 MG PO TABS: 1.0000 | ORAL_TABLET | Freq: Every evening | ORAL | Status: DC | PRN

## 2023-08-28 MED ORDER — HYDROMORPHONE HCL 2 MG PO TABS
4.0000 mg | ORAL_TABLET | Freq: Three times a day (TID) | ORAL | Status: DC | PRN
  Administered 2023-08-30 – 2023-09-04 (×9): 4 mg via ORAL
  Filled 2023-08-28 (×10): qty 2

## 2023-08-28 MED ORDER — SERTRALINE HCL 25 MG PO TABS: 75.0000 mg | ORAL_TABLET | Freq: Every day | ORAL | Status: DC

## 2023-08-28 MED ORDER — ACETAMINOPHEN 500 MG PO TABS
1000.0000 mg | ORAL_TABLET | Freq: Once | ORAL | Status: AC
  Administered 2023-08-28: 1000 mg via ORAL
  Filled 2023-08-28: qty 2

## 2023-08-28 NOTE — Hospital Course (Signed)
 Brian Ruiz is a 78 y.o. male with medical history significant for HTN, depression/anxiety, chronic pain syndrome with opioid dependence who presented to [facility] on 3/31 with complaints of generalized weakness and shortness of breath.    Upon evaluation in the emergency department patient was found to be positive for COVID-19 via PCR.  Patient was additionally found to be hypoxic requiring initiation of supplemental oxygen.  The hospitalist group was called to assess the patient for admission to the hospital.    Patient was managed with systemic steroids and supportive care in the days that followed.  Hospital course was complicated by increasing respiratory distress and acute hypoxic respiratory failure with associated acute metabolic encephalopathy.  Early the morning of 4/3 patient was identified to be having bouts of rapid atrial fibrillation with heart rates approaching 150 bpm that failed to improve with doses of intravenous metoprolol.  This was associated with increasing chest discomfort.  Further workup of this development revealed a troponin of 5919 concerning for NSTEMI.  Patient was therefore transferred to the stepdown unit, heparin was initiated, an amiodarone infusion was initiated and cardiology was consulted.  By the end of the day on 4/3 the patient continued to decline with decreasing urine output and worsening confusion with poorly managed atrial fibrillation.  At this point cardiology recommended transfer to [facility] with tentative plans for right and left heart catheterization on 4/4.  Patient was therefore transferred to the progressive unit at [facility] for further management.

## 2023-08-28 NOTE — Sepsis Progress Note (Signed)
 Secure chat with bedside nurse regarding lactic acid draw. Was told there was a issue with labeling specimen and will be redrawn.

## 2023-08-28 NOTE — ED Notes (Signed)
 Provider at bedside

## 2023-08-28 NOTE — ED Triage Notes (Signed)
 Pt states that he started feeling bad around 2 days ago. Pt states he had a fever today and took tylenol for his temp. Pt is alert and oriented x4. Pt does not wear O2 at home. His O2 on room air is 84. 95 on 4 L nasal canula.

## 2023-08-28 NOTE — H&P (Signed)
 History and Physical    Brian Ruiz:[id number] DOB: 16-Nov-1945 DOA: 08/28/2023  PCP: Bonnetta Barry, [facility]  Patient coming from: Home  I have personally briefly reviewed patient's old medical records in [facility]  Chief Complaint: Fever, cough, shortness of breath  HPI: Brian Ruiz is a 78 y.o. male with medical history significant for HTN, depression/anxiety, chronic pain syndrome with opioid dependence who presented to the ED for evaluation of shortness of breath.  Patient states that for the last 2 days he has been feeling generally weak especially in his legs.  He has difficulty ambulating.  He notes having new shortness of breath, nonproductive cough, and a fever this morning.  He says his wife has been sick with similar symptoms.  He denied any nausea, vomiting, abdominal pain, chest pain, diarrhea.  ED Course  Labs/Imaging on admission: I have personally reviewed following labs and imaging studies.  Initial vitals showed BP 157/102, pulse 100, RR 16, temp 99.1 F, SpO2 84% on room air per ED triage documentation.  Improved to 95% on 4 L O2 via [place].  Labs showed WBC 7.5, hemoglobin 13.5, platelets 190,000, sodium 132, potassium 4.2, bicarb 24, BUN 20, creatinine 1.10, serum glucose 125, LFTs within normal limits.  SARS-CoV-2 PCR positive.  Influenza and RSV negative.  Blood cultures in process.  Portable chest x-ray showed small left basilar atelectasis, stable markedly elevated right hemidiaphragm with adjacent right basilar atelectasis.  No evidence of pneumonia.  Patient was given 2 L LR, IV ceftriaxone and azithromycin.  The hospitalist service was consulted to admit.  Review of Systems: All systems reviewed and are negative except as documented in history of present illness above.   Past Medical History:  Diagnosis Date   Arthritis    Back pain, chronic    intolerant to narcotics, avoids NSAIDs due to vioxx related bleed,  sees pain clinic as [facility],  prior eval by [facility] neuro in [place]   Cholecystitis    Choledocholithiasis    Chronic diastolic CHF (congestive heart failure) (HCC) 05/16/2018   Per Dr Harvie Bridge note in 2019, pt did not have heart failure but hypertensive heart disease.  Joycelyn Man FNP-C   Encephalopathy acute 05/15/2011   Heart murmur    Hypertension    Inguinal hernia    Polyneuropathy    S/P thyroidectomy 1967   Scoliosis    Urinary retention     Past Surgical History:  Procedure Laterality Date   BACK SURGERY  1975   lumbx2   CHOLECYSTECTOMY N/A 11/13/2020   Procedure: LAPAROSCOPIC CHOLECYSTECTOMY WITH INTRAOPERATIVE CHOLANGIOGRAM;  Surgeon: Violeta Gelinas, MD;  Location: [facility] OR;  Service: General;  Laterality: N/A;   COLONOSCOPY     FOOT SURGERY  1985, 2000   due to arthritis   HAND SURGERY  2005   left - due to arthritis   INGUINAL HERNIA REPAIR  04/30/2012   Procedure: HERNIA REPAIR INGUINAL ADULT;  Surgeon: Currie Paris, MD;  Location: [facility];  Service: General;  Laterality: Left;  repair left inguinal hernia   THYROIDECTOMY  1967   TONSILLECTOMY      Social History: Patient reports a remote history of tobacco use, he quit when he was in his 21s.  Allergies  Allergen Reactions   Aspirin Shortness Of Breath    Other reaction(s): Bronchospasm (ALLERGY/intolerance)   Nsaids Shortness Of Breath    Other reaction(s): Bronchospasm (ALLERGY/intolerance)   Oxycodone Other (See Comments)    Agitation. Per the any  pain medication that has codeine in it causes that reaction. Other reaction(s): Other Agitation. Per the any pain medication that has codeine in it causes that reaction.   Tolmetin Shortness Of Breath   Tramadol Anaphylaxis   Codeine Anxiety    Other reaction(s): Bronchospasm (ALLERGY/intolerance), Other (See Comments) Irritable     Family History  Problem Relation Age of Onset   Heart disease Father      Prior to Admission medications   Medication Sig  Start Date End Date Taking? Authorizing Provider  acetaminophen (TYLENOL) 500 MG tablet Take 1,000 mg by mouth every 8 (eight) hours as needed. For pain.     [provider]  Artificial Tear Ointment (DRY EYES OP) Apply 1 drop to eye daily as needed (dry eyes).    [provider]  carvedilol (COREG) 25 MG tablet Take 1 tablet (25 mg total) by mouth 2 (two) times daily. Please make overdue appt with Dr. Elease Hashimoto before anymore refills. 1st attempt 08/12/19   Nahser, Deloris Ping, MD  fentaNYL (DURAGESIC) 25 MCG/HR Place 1 patch onto the skin every 3 (three) days.    Lurline Idol, FNP  HYDROmorphone (DILAUDID) 2 MG tablet Take 2 mg by mouth every 8 (eight) hours as needed for severe pain (Breakthrough pain Fentanyl).    [provider]  omeprazole (PRILOSEC) 40 MG capsule Take 40 mg by mouth daily. 09/07/20   [provider]  senna-docusate (SENOKOT-S) 8.6-50 MG tablet Take 2 tablets by mouth 2 (two) times daily as needed for moderate constipation. 11/15/20   Azucena Fallen, MD  sertraline (ZOLOFT) 25 MG tablet Take 25 mg by mouth daily.    [provider]    Physical Exam: Vitals:   08/28/23 1730 08/28/23 1800 08/28/23 2025 08/28/23 2030  BP: (!) 145/129 (!) 167/112  (!) 153/106  Pulse: (!) 51 (!) 126  (!) 125  Resp: (!) 27 18 (!) 23 17  Temp:   98.4 F (36.9 C)   TempSrc:      SpO2: 93% 93%  96%  Weight:      Height:       Constitutional: Resting in bed, NAD, calm, comfortable Eyes: EOMI, lids and conjunctivae normal ENMT: Mucous membranes are moist. Posterior pharynx clear of any exudate or lesions.Normal dentition.  Neck: normal, supple, no masses. Respiratory: Mild rhonchi and faint expiratory wheezing. Normal respiratory effort. No accessory muscle use.  Cardiovascular: Mild tachycardia, no murmurs / rubs / gallops. No extremity edema. 2+ pedal pulses. Abdomen: no tenderness, no masses palpated. Musculoskeletal: no clubbing / cyanosis.  No joint deformity upper and lower extremities. Good ROM, no contractures. Normal muscle tone.  Skin: no rashes, lesions, ulcers. No induration Neurologic: Sensation intact. Strength 5/5 in all 4.  Psychiatric: Normal judgment and insight. Alert and oriented x 3. Normal mood.   EKG: Personally reviewed. Sinus rhythm, rate 80, no acute ischemic changes.  Assessment/Plan Principal Problem:   Acute respiratory failure with hypoxia (HCC) Active Problems:   COVID-19 virus infection   Hypertension   Chronic back pain   Depression with anxiety   Brian Ruiz is a 78 y.o. male with medical history significant for HTN, depression/anxiety, chronic pain syndrome with opioid dependence who is admitted with acute hypoxic respiratory failure in setting of COVID-19 viral infection with generalized weakness.  Assessment and Plan: Acute respiratory failure with hypoxia due to COVID-19: SpO2 84% on RA on arrival to ED, improved on 3-4 L O2 via [place].  Hypoxia likely  related to mild bronchospasm from COVID-19 viral infection, bibasilar atelectasis, and diminished respiratory reserve from chronically elevated right hemidiaphragm.  No evidence of pneumonia on CXR. -Continue DuoNebs as needed, IS, FV -Hold further antibiotics -Wean off supplemental oxygen as able  Generalized weakness: Patient initially came in due to feeling weak in his legs and having difficulty ambulating.  Likely deconditioning in setting of COVID-19 viral infection.  No focal deficit on admission. -PT eval  Hypertension: Continue Coreg.  Depression/anxiety: Continue sertraline.  Chronic pain syndrome: Continue Dilaudid 4 mg every 8 hours as needed for severe pain.  PDMP reviewed with consistent prescriptions, last Rx hydromorphone 4 mg tablets #90 written and filled on 07/25/2023.   DVT prophylaxis: enoxaparin (LOVENOX) injection 40 mg Start: 08/28/23 2245 Code Status:   Code Status: Limited: Do not attempt resuscitation (DNR)  -DNR-LIMITED -Do Not Intubate/DNI confirmed with patient on admission. Family Communication: Discussed with patient, he has discussed with family Disposition Plan: From home and likely discharge to home pending clinical progress Consults called: None Severity of Illness: The appropriate patient status for this patient is OBSERVATION. Observation status is judged to be reasonable and necessary in order to provide the required intensity of service to ensure the patient's safety. The patient's presenting symptoms, physical exam findings, and initial radiographic and laboratory data in the context of their medical condition is felt to place them at decreased risk for further clinical deterioration. Furthermore, it is anticipated that the patient will be medically stable for discharge from the hospital within 2 midnights of admission.   Darreld Mclean MD [facility]  If 7PM-7AM, please contact night-coverage [email or web address]  08/28/2023, 10:52 PM

## 2023-08-28 NOTE — Sepsis Progress Note (Signed)
 Sepsis protocol is being followed by [facility].

## 2023-08-28 NOTE — Progress Notes (Signed)
 [facility] ED [facility] Liaison Note  This patient is enrolled in the Home Based Primary Care program of Civil engineer, contracting.  In the absence of acute hospitalization needs, this patient can be managed tomorrow at home with a provider visit for any needed follow up, including lab work, Veterinary surgeon, Catering manager.  Hospital Liaison Team will follow for disposition.  Please reach out if there are questions or concerns.  Haynes Bast, BSN, [employer]  [phone]

## 2023-08-28 NOTE — ED Notes (Signed)
 This RN to room. Pt sitting at foot of bed, disconnected from monitor with clothes off. Assessed needs in which pt stated they needed to use urinal. Assisted pt with urinal and help them return to position of comfort in bed. Gown provided. Connected back to monitor and explained how to use call light when they needed assistance. Pt stated understanding. Left AC IV displaced by pt upon assessment. Left wrist IV patent and secured.

## 2023-08-28 NOTE — ED Provider Notes (Signed)
 [place] EMERGENCY DEPARTMENT AT [facility] Provider Note  CSN: [id number] Arrival date & time: 08/28/23 1528  Chief Complaint(s) Fever  HPI CHARLOTTE BRAFFORD is a 78 y.o. male who is here today for fever, cough and shortness of breath.  Patient says symptoms began about 2 days ago.  When he arrived to the emergency room, patient was noted to have an O2 sat in the mid 80s.  He does not wear oxygen at home.  He denies any abdominal pain, vomiting or diarrhea.   Past Medical History Past Medical History:  Diagnosis Date   Arthritis    Back pain, chronic    intolerant to narcotics, avoids NSAIDs due to vioxx related bleed,  sees pain clinic as [facility], prior eval by [facility] neuro in [place]   Cholecystitis    Choledocholithiasis    Chronic diastolic CHF (congestive heart failure) (HCC) 05/16/2018   Per Dr Harvie Bridge note in 2019, pt did not have heart failure but hypertensive heart disease.  Joycelyn Man FNP-C   Encephalopathy acute 05/15/2011   Heart murmur    Hypertension    Inguinal hernia    Polyneuropathy    S/P thyroidectomy 1967   Scoliosis    Urinary retention    Patient Active Problem List   Diagnosis Date Noted   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   Depression with anxiety 09/12/2021   Palliative care encounter 08/03/2021   Physical debility 08/03/2021   Severe protein-calorie malnutrition (HCC) 08/03/2021   Chronic fatigue 06/14/2017   Palpitations 06/14/2017   Aortic ectasia (HCC) 11/15/2016   Age-related osteoporosis without current pathological fracture 10/27/2016   Screening for AAA (abdominal aortic aneurysm) 10/27/2016   Screening for prostate cancer 10/27/2016   Need for hepatitis C screening test 10/27/2016   Encounter for general adult medical examination without abnormal findings 11/26/2013   Screening for osteoporosis 11/26/2013   Kyphoscoliosis deformity of spine 07/04/2013   Enlarged prostate with lower urinary tract  symptoms (LUTS) 04/12/2013   Screen for colon cancer 04/12/2013   Undiagnosed cardiac murmurs 04/12/2013   Backache 04/12/2013   White coat syndrome with hypertension 04/12/2013   Lumbar spinal stenosis 10/29/2012   DDD (degenerative disc disease), lumbosacral 03/16/2012   Lumbosacral radiculopathy 03/16/2012   Generalized anxiety disorder 03/14/2012   Renal pain 12/16/2011   Sacroiliitis, not elsewhere classified (HCC) 08/11/2011   Sacroiliac joint pain 07/27/2011   Bilateral hip pain 07/07/2011   Chronic pain syndrome 07/07/2011   Spondylolisthesis of lumbosacral region 07/07/2011   Hereditary and idiopathic peripheral neuropathy 05/27/2011   Accidental drug overdose 05/15/2011   Guilty feelings 05/15/2011   Hypertension 05/15/2011   Hypotension 05/15/2011   Bradycardia 05/15/2011   Chronic back pain 05/15/2011   Vitamin D deficiency 03/08/2011   Home Medication(s) Prior to Admission medications   Medication Sig Start Date End Date Taking? Authorizing Provider  acetaminophen (TYLENOL) 500 MG tablet Take 1,000 mg by mouth every 8 (eight) hours as needed. For pain.     [provider]  Artificial Tear Ointment (DRY EYES OP) Apply 1 drop to eye daily as needed (dry eyes).    [provider]  carvedilol (COREG) 25 MG tablet Take 1 tablet (25 mg total) by mouth 2 (two) times daily. Please make overdue appt with Dr. Elease Hashimoto before anymore refills. 1st attempt 08/12/19   Nahser, Deloris Ping, MD  fentaNYL (DURAGESIC) 25 MCG/HR Place 1 patch onto the skin every 3 (three) days.    Lurline Idol, FNP  HYDROmorphone (DILAUDID) 2 MG tablet Take 2 mg by mouth every 8 (eight) hours as needed for severe pain (Breakthrough pain Fentanyl).    [provider]  omeprazole (PRILOSEC) 40 MG capsule Take 40 mg by mouth daily. 09/07/20   [provider]  senna-docusate (SENOKOT-S) 8.6-50 MG tablet Take 2 tablets by mouth 2 (two) times daily as needed for moderate  constipation. 11/15/20   Azucena Fallen, MD  sertraline (ZOLOFT) 25 MG tablet Take 25 mg by mouth daily.    [provider]                                                                                                                                    Past Surgical History Past Surgical History:  Procedure Laterality Date   BACK SURGERY  1975   lumbx2   CHOLECYSTECTOMY N/A 11/13/2020   Procedure: LAPAROSCOPIC CHOLECYSTECTOMY WITH INTRAOPERATIVE CHOLANGIOGRAM;  Surgeon: Violeta Gelinas, MD;  Location: [facility] OR;  Service: General;  Laterality: N/A;   COLONOSCOPY     FOOT SURGERY  1985, 2000   due to arthritis   HAND SURGERY  2005   left - due to arthritis   INGUINAL HERNIA REPAIR  04/30/2012   Procedure: HERNIA REPAIR INGUINAL ADULT;  Surgeon: Currie Paris, MD;  Location: [facility];  Service: General;  Laterality: Left;  repair left inguinal hernia   THYROIDECTOMY  1967   TONSILLECTOMY     Family History Family History  Problem Relation Age of Onset   Heart disease Father     Social History Social History   Tobacco Use   Smoking status: Never   Smokeless tobacco: Never  Vaping Use   Vaping status: Never Used  Substance Use Topics   Alcohol use: No   Drug use: No   Allergies Aspirin, Nsaids, Oxycodone, Tolmetin, Tramadol, and Codeine  Review of Systems Review of Systems  Physical Exam Vital Signs  I have reviewed the triage vital signs BP (!) 167/112   Pulse (!) 126   Temp 99.1 F (37.3 C) (Oral)   Resp 18   Ht 6' (1.829 m)   Wt 84.6 kg   SpO2 93%   BMI 25.31 kg/m   Physical Exam Vitals reviewed.  Cardiovascular:     Rate and Rhythm: Normal rate.  Pulmonary:     Effort: Pulmonary effort is normal.     Breath sounds: Rhonchi present. No wheezing or rales.  Abdominal:     General: Abdomen is flat. There is no distension.     Palpations: Abdomen is soft.     Tenderness: There is no abdominal tenderness. There is no  guarding.  Musculoskeletal:        General: Normal range of motion.  Skin:    General: Skin is warm.  Neurological:     General: No focal deficit present.     Mental Status: He is alert.  ED Results and Treatments Labs (all labs ordered are listed, but only abnormal results are displayed) Labs Reviewed  RESP PANEL BY RT-PCR (RSV, FLU A&B, COVID)  RVPGX2 - Abnormal; Notable for the following components:      Result Value   SARS Coronavirus 2 by RT PCR POSITIVE (*)    All other components within normal limits  COMPREHENSIVE METABOLIC PANEL WITH GFR - Abnormal; Notable for the following components:   Sodium 132 (*)    Chloride 97 (*)    Glucose, Bld 125 (*)    All other components within normal limits  CBC WITH DIFFERENTIAL/PLATELET - Abnormal; Notable for the following components:   Lymphs Abs 0.3 (*)    All other components within normal limits  CULTURE, BLOOD (ROUTINE X 2)  CULTURE, BLOOD (ROUTINE X 2)  PROTIME-INR  APTT  LACTIC ACID, PLASMA                                                                                                                          Radiology DG Chest Port 1 View Result Date: 08/28/2023 CLINICAL DATA:  Possible sepsis. EXAM: PORTABLE CHEST 1 VIEW COMPARISON:  11/10/2020.  Abdomen and pelvis CT dated 11/10/2020. FINDINGS: Stable markedly elevated right hemidiaphragm with adjacent right basilar atelectasis. Interval small amount of left basilar atelectasis. No airspace consolidation suspicious for pneumonia. Normal-sized heart. Tortuous and partially calcified thoracic aorta. Diffuse osteopenia. IMPRESSION: 1. Interval small amount of left basilar atelectasis. 2. Stable markedly elevated right hemidiaphragm with adjacent right basilar atelectasis. 3. No evidence of pneumonia. Electronically Signed   By: Beckie Salts M.D.   On: 08/28/2023 16:41    Pertinent labs & imaging results that were available during my care of the patient were reviewed by me  and considered in my medical decision making (see MDM for details).  Medications Ordered in ED Medications  lactated ringers bolus 1,000 mL (has no administration in time range)  acetaminophen (TYLENOL) tablet 1,000 mg (has no administration in time range)  cefTRIAXone (ROCEPHIN) 2 g in sodium chloride 0.9 % 100 mL IVPB (2 g Intravenous New Bag/Given 08/28/23 1635)  azithromycin (ZITHROMAX) 500 mg in sodium chloride 0.9 % 250 mL IVPB (500 mg Intravenous New Bag/Given 08/28/23 1635)  lactated ringers bolus 1,000 mL (1,000 mLs Intravenous New Bag/Given 08/28/23 1635)  Procedures .Critical Care  Performed by: Arletha Pili, DO Authorized by: Arletha Pili, DO   Critical care provider statement:    Critical care time (minutes):  33   Critical care was necessary to treat or prevent imminent or life-threatening deterioration of the following conditions:  Respiratory failure   Critical care was time spent personally by me on the following activities:  Development of treatment plan with patient or surrogate, discussions with consultants, evaluation of patient's response to treatment, examination of patient, ordering and review of laboratory studies, ordering and review of radiographic studies, ordering and performing treatments and interventions, pulse oximetry, re-evaluation of patient's condition and review of old charts   (including critical care time)  Medical Decision Making / ED Course   This patient presents to the ED for concern of shortness of breath, cough, fever, this involves an extensive number of treatment options, and is a complaint that carries with it a high risk of complications and morbidity.   MDM:  78 year old male here today with shortness of breath, fever and cough.  Patient here with wife with similar symptoms.  Considered pneumonia in  this patient, and with his vital signs, initially did meet sepsis criteria.  Started on antibiotics.  Patient ultimately tested positive for COVID-19.  He is currently requiring 3 L of oxygen, satting 93%.  He looks comfortable with this amount of support.  Lower suspicion for thromboembolic process.  Patient's wife also tested positive for COVID-19.  With the patient's O2 requirement, will admit to hospitalist for COVID hypoxia.  Additional history obtained: -Additional history obtained from patient's wife -External records from outside source obtained and reviewed including: Chart review including previous notes, labs, imaging, consultation notes   Lab Tests: -I ordered, reviewed, and interpreted labs.   The pertinent results include:   Labs Reviewed  RESP PANEL BY RT-PCR (RSV, FLU A&B, COVID)  RVPGX2 - Abnormal; Notable for the following components:      Result Value   SARS Coronavirus 2 by RT PCR POSITIVE (*)    All other components within normal limits  COMPREHENSIVE METABOLIC PANEL WITH GFR - Abnormal; Notable for the following components:   Sodium 132 (*)    Chloride 97 (*)    Glucose, Bld 125 (*)    All other components within normal limits  CBC WITH DIFFERENTIAL/PLATELET - Abnormal; Notable for the following components:   Lymphs Abs 0.3 (*)    All other components within normal limits  CULTURE, BLOOD (ROUTINE X 2)  CULTURE, BLOOD (ROUTINE X 2)  PROTIME-INR  APTT  LACTIC ACID, PLASMA      EKG no ST segment depressions or elevations, no evidence of acute ischemia  EKG Interpretation Date/Time:  Monday August 28 2023 15:59:09 EDT Ventricular Rate:  80 PR Interval:  58 QRS Duration:  113 QT Interval:  391 QTC Calculation: 451 R Axis:   -18  Text Interpretation: Sinus rhythm Short PR interval Borderline intraventricular conduction delay Confirmed by Anders Simmonds [phone]) on 08/28/2023 7:22:44 PM         Imaging Studies ordered: I ordered imaging studies  including chest x-ray I independently visualized and interpreted imaging. I agree with the radiologist interpretation   Medicines ordered and prescription drug management: Meds ordered this encounter  Medications   DISCONTD: lactated ringers infusion   DISCONTD: lactated ringers bolus 2,000 mL    Reason 30 mL/kg dose is not being ordered:   First Lactic Acid Pending   cefTRIAXone (ROCEPHIN) 2 g in  sodium chloride 0.9 % 100 mL IVPB    Antibiotic Indication::   CAP   azithromycin (ZITHROMAX) 500 mg in sodium chloride 0.9 % 250 mL IVPB    Antibiotic Indication::   CAP   lactated ringers bolus 1,000 mL   lactated ringers bolus 1,000 mL   acetaminophen (TYLENOL) tablet 1,000 mg    -I have reviewed the patients home medicines and have made adjustments as needed  Critical interventions Hypoxia related to COVID-19   Cardiac Monitoring: The patient was maintained on a cardiac monitor.  I personally viewed and interpreted the cardiac monitored which showed an underlying rhythm of: Normal sinus rhythm  Reevaluation: After the interventions noted above, I reevaluated the patient and found that they have :improved  Co morbidities that complicate the patient evaluation  Past Medical History:  Diagnosis Date   Arthritis    Back pain, chronic    intolerant to narcotics, avoids NSAIDs due to vioxx related bleed,  sees pain clinic as [facility], prior eval by [facility] neuro in [place]   Cholecystitis    Choledocholithiasis    Chronic diastolic CHF (congestive heart failure) (HCC) 05/16/2018   Per Dr Harvie Bridge note in 2019, pt did not have heart failure but hypertensive heart disease.  Joycelyn Man FNP-C   Encephalopathy acute 05/15/2011   Heart murmur    Hypertension    Inguinal hernia    Polyneuropathy    S/P thyroidectomy 1967   Scoliosis    Urinary retention       Dispostion: Admission     Final Clinical Impression(s) / ED Diagnoses Final diagnoses:  COVID  Hypoxia      @PCDICTATION @    Anders Simmonds T, DO 08/28/23 1927

## 2023-08-29 DIAGNOSIS — F1120 Opioid dependence, uncomplicated: Secondary | ICD-10-CM | POA: Diagnosis present

## 2023-08-29 DIAGNOSIS — E890 Postprocedural hypothyroidism: Secondary | ICD-10-CM | POA: Diagnosis present

## 2023-08-29 DIAGNOSIS — J9601 Acute respiratory failure with hypoxia: Secondary | ICD-10-CM | POA: Diagnosis present

## 2023-08-29 DIAGNOSIS — I21A1 Myocardial infarction type 2: Secondary | ICD-10-CM | POA: Diagnosis not present

## 2023-08-29 DIAGNOSIS — R64 Cachexia: Secondary | ICD-10-CM | POA: Diagnosis present

## 2023-08-29 DIAGNOSIS — I5043 Acute on chronic combined systolic (congestive) and diastolic (congestive) heart failure: Secondary | ICD-10-CM | POA: Diagnosis not present

## 2023-08-29 DIAGNOSIS — Z8616 Personal history of COVID-19: Secondary | ICD-10-CM | POA: Diagnosis not present

## 2023-08-29 DIAGNOSIS — G9341 Metabolic encephalopathy: Secondary | ICD-10-CM | POA: Diagnosis not present

## 2023-08-29 DIAGNOSIS — I081 Rheumatic disorders of both mitral and tricuspid valves: Secondary | ICD-10-CM | POA: Diagnosis present

## 2023-08-29 DIAGNOSIS — E872 Acidosis, unspecified: Secondary | ICD-10-CM | POA: Diagnosis present

## 2023-08-29 DIAGNOSIS — E871 Hypo-osmolality and hyponatremia: Secondary | ICD-10-CM | POA: Diagnosis present

## 2023-08-29 DIAGNOSIS — I428 Other cardiomyopathies: Secondary | ICD-10-CM | POA: Diagnosis present

## 2023-08-29 DIAGNOSIS — F0393 Unspecified dementia, unspecified severity, with mood disturbance: Secondary | ICD-10-CM | POA: Diagnosis present

## 2023-08-29 DIAGNOSIS — I472 Ventricular tachycardia, unspecified: Secondary | ICD-10-CM | POA: Diagnosis present

## 2023-08-29 DIAGNOSIS — K7200 Acute and subacute hepatic failure without coma: Secondary | ICD-10-CM | POA: Diagnosis not present

## 2023-08-29 DIAGNOSIS — I130 Hypertensive heart and chronic kidney disease with heart failure and stage 1 through stage 4 chronic kidney disease, or unspecified chronic kidney disease: Secondary | ICD-10-CM | POA: Diagnosis present

## 2023-08-29 DIAGNOSIS — I214 Non-ST elevation (NSTEMI) myocardial infarction: Secondary | ICD-10-CM | POA: Diagnosis not present

## 2023-08-29 DIAGNOSIS — K761 Chronic passive congestion of liver: Secondary | ICD-10-CM | POA: Diagnosis present

## 2023-08-29 DIAGNOSIS — I340 Nonrheumatic mitral (valve) insufficiency: Secondary | ICD-10-CM | POA: Diagnosis not present

## 2023-08-29 DIAGNOSIS — J1282 Pneumonia due to coronavirus disease 2019: Secondary | ICD-10-CM | POA: Diagnosis present

## 2023-08-29 DIAGNOSIS — R570 Cardiogenic shock: Secondary | ICD-10-CM | POA: Diagnosis not present

## 2023-08-29 DIAGNOSIS — I4819 Other persistent atrial fibrillation: Secondary | ICD-10-CM | POA: Diagnosis present

## 2023-08-29 DIAGNOSIS — R0902 Hypoxemia: Secondary | ICD-10-CM | POA: Diagnosis present

## 2023-08-29 DIAGNOSIS — F0394 Unspecified dementia, unspecified severity, with anxiety: Secondary | ICD-10-CM | POA: Diagnosis present

## 2023-08-29 DIAGNOSIS — I5033 Acute on chronic diastolic (congestive) heart failure: Secondary | ICD-10-CM | POA: Diagnosis not present

## 2023-08-29 DIAGNOSIS — F32A Depression, unspecified: Secondary | ICD-10-CM | POA: Diagnosis present

## 2023-08-29 DIAGNOSIS — U071 COVID-19: Secondary | ICD-10-CM | POA: Diagnosis present

## 2023-08-29 DIAGNOSIS — I5021 Acute systolic (congestive) heart failure: Secondary | ICD-10-CM | POA: Diagnosis not present

## 2023-08-29 DIAGNOSIS — I4891 Unspecified atrial fibrillation: Secondary | ICD-10-CM | POA: Diagnosis not present

## 2023-08-29 DIAGNOSIS — Z66 Do not resuscitate: Secondary | ICD-10-CM | POA: Diagnosis present

## 2023-08-29 LAB — CBC
HCT: 37.6 % — ABNORMAL LOW (ref 39.0–52.0)
Hemoglobin: 11.9 g/dL — ABNORMAL LOW (ref 13.0–17.0)
MCH: 30.7 pg (ref 26.0–34.0)
MCHC: 31.6 g/dL (ref 30.0–36.0)
MCV: 97.2 fL (ref 80.0–100.0)
Platelets: 160 10*3/uL (ref 150–400)
RBC: 3.87 MIL/uL — ABNORMAL LOW (ref 4.22–5.81)
RDW: 13.1 % (ref 11.5–15.5)
WBC: 8.6 10*3/uL (ref 4.0–10.5)
nRBC: 0 % (ref 0.0–0.2)

## 2023-08-29 LAB — BASIC METABOLIC PANEL WITH GFR
Anion gap: 9 (ref 5–15)
BUN: 15 mg/dL (ref 8–23)
CO2: 23 mmol/L (ref 22–32)
Calcium: 8.9 mg/dL (ref 8.9–10.3)
Chloride: 101 mmol/L (ref 98–111)
Creatinine, Ser: 0.96 mg/dL (ref 0.61–1.24)
GFR, Estimated: >60 mL/min (ref >60)
Glucose, Bld: 92 mg/dL (ref 70–99)
Potassium: 4 mmol/L (ref 3.5–5.1)
Sodium: 133 mmol/L — ABNORMAL LOW (ref 135–145)

## 2023-08-29 MED ORDER — METHYLPREDNISOLONE SODIUM SUCC 40 MG IJ SOLR
40.0000 mg | Freq: Two times a day (BID) | INTRAMUSCULAR | Status: DC
  Administered 2023-08-29 – 2023-08-30 (×3): 40 mg via INTRAVENOUS
  Filled 2023-08-29 (×3): qty 1

## 2023-08-29 MED ORDER — SERTRALINE HCL 25 MG PO TABS
25.0000 mg | ORAL_TABLET | Freq: Three times a day (TID) | ORAL | Status: DC
  Administered 2023-08-29 – 2023-09-08 (×29): 25 mg via ORAL
  Filled 2023-08-29 (×29): qty 1

## 2023-08-29 MED ORDER — MELATONIN 5 MG PO TABS
5.0000 mg | ORAL_TABLET | Freq: Once | ORAL | Status: AC
  Administered 2023-08-30: 5 mg via ORAL
  Filled 2023-08-29: qty 1

## 2023-08-29 NOTE — Plan of Care (Signed)
  Problem: Education: Goal: Knowledge of risk factors and measures for prevention of condition will improve 08/29/2023 0432 by Theodis Aguas, LPN Outcome: Progressing 08/29/2023 0432 by Theodis Aguas, LPN Outcome: Progressing   Problem: Coping: Goal: Psychosocial and spiritual needs will be supported 08/29/2023 0432 by Theodis Aguas, LPN Outcome: Progressing 08/29/2023 0432 by Theodis Aguas, LPN Outcome: Progressing   Problem: Respiratory: Goal: Will maintain a patent airway 08/29/2023 0432 by Theodis Aguas, LPN Outcome: Progressing 08/29/2023 0432 by Theodis Aguas, LPN Outcome: Progressing Goal: Complications related to the disease process, condition or treatment will be avoided or minimized Outcome: Progressing   Problem: Education: Goal: Knowledge of General Education information will improve Description: Including pain rating scale, medication(s)/side effects and non-pharmacologic comfort measures Outcome: Progressing   Problem: Health Behavior/Discharge Planning: Goal: Ability to manage health-related needs will improve Outcome: Progressing   Problem: Clinical Measurements: Goal: Ability to maintain clinical measurements within normal limits will improve Outcome: Progressing Goal: Will remain free from infection Outcome: Progressing Goal: Diagnostic test results will improve Outcome: Progressing Goal: Respiratory complications will improve Outcome: Progressing Goal: Cardiovascular complication will be avoided Outcome: Progressing   Problem: Activity: Goal: Risk for activity intolerance will decrease Outcome: Progressing   Problem: Nutrition: Goal: Adequate nutrition will be maintained Outcome: Progressing   Problem: Coping: Goal: Level of anxiety will decrease Outcome: Progressing   Problem: Elimination: Goal: Will not experience complications related to bowel motility Outcome: Progressing Goal: Will not experience complications related to  urinary retention Outcome: Progressing   Problem: Pain Managment: Goal: General experience of comfort will improve and/or be controlled Outcome: Progressing   Problem: Safety: Goal: Ability to remain free from injury will improve Outcome: Progressing   Problem: Skin Integrity: Goal: Risk for impaired skin integrity will decrease Outcome: Progressing

## 2023-08-29 NOTE — Evaluation (Signed)
 Occupational Therapy Evaluation Patient Details Name: Brian Ruiz MRN: [id number] DOB: 05-Jan-1946 Today's Date: 08/29/2023   History of Present Illness   Brian Ruiz is a 78 y.o. male presents with SOB; admitted with acute respiratory failure with hypoxia, positive for covid19. PMH: HTN, depression/anxiety, chronic pain syndrome with opioid dependence, scoliosis     Clinical Impressions Patient is a 78 year old male who was admitted for above. Patient was living at home independently with wife with RW prior level. Currently, patient has increased confusion and indecisiveness with participation in ADLs. Patient was able to maintain O2 on RA during session in 90s. Patient was min A for sitting balance with kyphotic posture impacting balance with ADLs at sitting level. Patient will benefit from continued inpatient follow up therapy, <3 hours/day. Patient would continue to benefit from skilled OT services at this time while admitted and after d/c to address noted deficits in order to improve overall safety and independence in ADLs.       If plan is discharge home, recommend the following:   A lot of help with bathing/dressing/bathroom;Assistance with cooking/housework;Direct supervision/assist for medications management;Assist for transportation;Help with stairs or ramp for entrance;Direct supervision/assist for financial management     Functional Status Assessment   Patient has had a recent decline in their functional status and demonstrates the ability to make significant improvements in function in a reasonable and predictable amount of time.     Equipment Recommendations   None recommended by OT      Precautions/Restrictions   Precautions Precautions: Fall Recall of Precautions/Restrictions: Intact Precaution/Restrictions Comments: airborn/contact Restrictions Weight Bearing Restrictions Per Provider Order: No     Mobility Bed Mobility Overal bed mobility: Needs  Assistance Bed Mobility: Supine to Sit, Sit to Supine     Supine to sit: Min assist, HOB elevated, Used rails Sit to supine: Min assist   General bed mobility comments: with increased time.            Balance Overall balance assessment: Needs assistance Sitting-balance support: Feet supported Sitting balance-Leahy Scale: Poor Sitting balance - Comments: with patients scoliosis and posterior pelvic tilt impacting balacne sitting EOB   Standing balance support: Reliant on assistive device for balance, During functional activity, Bilateral upper extremity supported Standing balance-Leahy Scale: Poor         ADL either performed or assessed with clinical judgement   ADL Overall ADL's : Needs assistance/impaired Eating/Feeding: Sitting Eating/Feeding Details (indicate cue type and reason): patient declined to eat lunch. patient upon entrance to room had full tray of lunch infront of him. he said nothing tastes right but was noted to have no visable bites missing from tray. patient asked for tray not to be taken. patient was assured that therapist was not taking tray just moving it to the side to engage in session. at end of session patient asked for tray to be placed back in front of him then repositioned out of reach on R side of him and then changed mind again. patient made no moves to touch food. educated on ordering different food if to cold or not what patient wanted. patient verbalized undersrtanding. nurse made aware. tray was left positioned on R side of bed per patients request with bed rail in place with patient able to reach drinks and food. Grooming: Sitting;Set up;Contact guard assist   Upper Body Bathing: Sitting;Minimal assistance   Lower Body Bathing: Sitting/lateral leans;Maximal assistance Lower Body Bathing Details (indicate cue type and reason): unable to figure  four. strong posterior pelvic tilt. Upper Body Dressing : Sitting;Minimal assistance   Lower Body  Dressing: Sitting/lateral leans;Maximal assistance Lower Body Dressing Details (indicate cue type and reason): strong posterior pelvic tilt. Toilet Transfer: Minimal assistance;Ambulation;Rolling walker (2 wheels) Toilet Transfer Details (indicate cue type and reason): to take small steps up head of bed with patient reporting SOB. patients O2 was 96% on RA during session. Toileting- Clothing Manipulation and Hygiene: Bed level;Total assistance               Vision   Vision Assessment?: No apparent visual deficits            Pertinent Vitals/Pain Pain Assessment Pain Assessment: No/denies pain     Extremity/Trunk Assessment Upper Extremity Assessment Upper Extremity Assessment: Generalized weakness (patient having hard time following commands for MMT)   Lower Extremity Assessment Lower Extremity Assessment: Generalized weakness (AROM WFL, strength grossly 3+/5, reports chronic L hip pain wtih MMT)   Cervical / Trunk Assessment Cervical / Trunk Assessment: Kyphotic   Communication Communication Communication: Impaired Factors Affecting Communication: Hearing impaired   Cognition Arousal: Alert Behavior During Therapy: Flat affect Cognition: Cognition impaired     Awareness: Online awareness impaired   Attention impairment (select first level of impairment): Selective attention Executive functioning impairment (select all impairments): Problem solving, Reasoning, Sequencing                   Following commands: Intact       Cueing  General Comments      pt on RA with SpO2 92-97% throughout session, cued for pursed lip breathing as pt appears SOB and fatigued with mobility           Home Living Family/patient expects to be discharged to:: Private residence Living Arrangements: Spouse/significant other Available Help at Discharge: Family;Available 24 hours/day Type of Home: House Home Access: Level entry     Home Layout: One level      Bathroom Shower/Tub: Producer, television/film/video: Handicapped height     Home Equipment: Agricultural consultant (2 wheels);Cane - single point          Prior Functioning/Environment Prior Level of Function : Independent/Modified Independent             Mobility Comments: pt reports ind with RW for household ambulation, reports not going out into community ADLs Comments: patietn reported he did everything himself    OT Problem List: Impaired balance (sitting and/or standing);Decreased range of motion;Decreased safety awareness;Cardiopulmonary status limiting activity;Decreased knowledge of precautions;Decreased activity tolerance;Decreased knowledge of use of DME or AE   OT Treatment/Interventions: Self-care/ADL training;DME and/or AE instruction;Therapeutic activities;Balance training;Therapeutic exercise;Neuromuscular education;Energy conservation;Patient/family education      OT Goals(Current goals can be found in the care plan section)   Acute Rehab OT Goals Patient Stated Goal: none stated OT Goal Formulation: Patient unable to participate in goal setting Time For Goal Achievement: 09/12/23 Potential to Achieve Goals: Fair   OT Frequency:  Min 2X/week       AM-PAC OT "6 Clicks" Daily Activity     Outcome Measure Help from another person eating meals?: A Little Help from another person taking care of personal grooming?: A Lot Help from another person toileting, which includes using toliet, bedpan, or urinal?: A Lot Help from another person bathing (including washing, rinsing, drying)?: A Lot Help from another person to put on and taking off regular upper body clothing?: A Lot Help from another person to put on and taking off  regular lower body clothing?: A Lot 6 Click Score: 13   End of Session Equipment Utilized During Treatment: Rolling walker (2 wheels) Nurse Communication: Mobility status  Activity Tolerance: Patient limited by fatigue Patient left: in  bed;with call bell/phone within reach;with bed alarm set  OT Visit Diagnosis: Unsteadiness on feet (R26.81);Other abnormalities of gait and mobility (R26.89);Muscle weakness (generalized) (M62.81)                Time: 1610-9604 OT Time Calculation (min): 20 min Charges:  OT General Charges $OT Visit: 1 Visit OT Evaluation $OT Eval Moderate Complexity: 1 Mod  Vetra Shinall OTR/L, MS Acute Rehabilitation Department Office# [phone]   Selinda Flavin 08/29/2023, 1:40 PM

## 2023-08-29 NOTE — Evaluation (Signed)
 Physical Therapy Evaluation Patient Details Name: Brian Ruiz MRN: [id number] DOB: 1945/12/13 Today's Date: 08/29/2023  History of Present Illness  TERRAL COOKS is a 78 y.o. male presents with SOB; admitted with acute respiratory failure with hypoxia, positive for covid19. PMH: HTN, depression/anxiety, chronic pain syndrome with opioid dependence, scoliosis  Clinical Impression  Pt admitted with above diagnosis. Pt reports poor memory, no family present on eval. Reports ind at baseline using RW for household amb, reports spouse completes household chores, reports "I haven't exercised in 9 years". On eval, pt needing min A with bed mobility, cues for sequencing and hand placement. Min A to power up from elevated bed with RW and step pivot to recliner at bedside with RW, therapist assisting to manage and maneuver RW to maintain pt's body positioning within RW. Pt on RA with SpO2 92-97%, cued for pursed lip breathing with mobility as pt appears fatigued and SOB at times. Instructed pt on flutter use while up in recliner, demonstrates good understanding, though will need f/u due to memory deficits. Anticipate HHPT with spouse support at d/c. Pt currently with functional limitations due to the deficits listed below (see PT Problem List). Pt will benefit from acute skilled PT to increase their independence and safety with mobility to allow discharge.           If plan is discharge home, recommend the following: A little help with walking and/or transfers;A little help with bathing/dressing/bathroom;Assistance with cooking/housework;Assist for transportation   Can travel by private vehicle        Equipment Recommendations None recommended by PT  Recommendations for Other Services       Functional Status Assessment Patient has had a recent decline in their functional status and demonstrates the ability to make significant improvements in function in a reasonable and predictable amount of time.      Precautions / Restrictions Precautions Precautions: Fall Recall of Precautions/Restrictions: Intact Precaution/Restrictions Comments: airborn/contact Restrictions Weight Bearing Restrictions Per Provider Order: No      Mobility  Bed Mobility Overal bed mobility: Needs Assistance Bed Mobility: Supine to Sit     Supine to sit: Min assist, HOB elevated, Used rails     General bed mobility comments: increased time and effort, pt using bedrails to assist, needing min A to upright trunk and scoot out to EOB, verbal cues for pursed lip breathing    Transfers Overall transfer level: Needs assistance Equipment used: Rolling walker (2 wheels) Transfers: Sit to/from Stand, Bed to chair/wheelchair/BSC Sit to Stand: Min assist, From elevated surface   Step pivot transfers: Min assist       General transfer comment: STS from elevated EOB with min A, slow to power up, BUE assisting, hip extension noted last, once standing able to use RW for step pivot, therapist blocking RW to maintain correct positioning/management, min A to steady with step pivot to recliner    Ambulation/Gait               General Gait Details: pt fatigues wtih step pivot to recliner, able to clear feet with 2-3 steps over to recliner  Stairs            Wheelchair Mobility     Tilt Bed    Modified Rankin (Stroke Patients Only)       Balance Overall balance assessment: Needs assistance Sitting-balance support: Feet supported Sitting balance-Leahy Scale: Fair     Standing balance support: Reliant on assistive device for balance, During functional activity, Bilateral  upper extremity supported Standing balance-Leahy Scale: Poor                               Pertinent Vitals/Pain Pain Assessment Pain Assessment: No/denies pain    Home Living Family/patient expects to be discharged to:: Private residence Living Arrangements: Spouse/significant other Available Help at  Discharge: Family;Available 24 hours/day ("kids live in [place] and aren't here all the time") Type of Home: House (townhome) Home Access: Level entry       Home Layout: One level Home Equipment: Agricultural consultant (2 wheels);Cane - single point      Prior Function Prior Level of Function : Independent/Modified Independent             Mobility Comments: pt reports ind with RW for household ambulation, reports not going out into community ADLs Comments: pt reports "I think I do it" in regards to self care, spouse completes household chores, grocery shopping, drives     Extremity/Trunk Assessment   Upper Extremity Assessment Upper Extremity Assessment: Defer to OT evaluation    Lower Extremity Assessment Lower Extremity Assessment: Generalized weakness (AROM WFL, strength grossly 3+/5, reports chronic L hip pain wtih MMT)    Cervical / Trunk Assessment Cervical / Trunk Assessment: Kyphotic  Communication   Communication Communication: Impaired Factors Affecting Communication: Hearing impaired (HOH)    Cognition Arousal: Alert Behavior During Therapy: WFL for tasks assessed/performed   PT - Cognitive impairments: No family/caregiver present to determine baseline                       PT - Cognition Comments: pt reports his memory "seems to be worse lately", unaware of date but able to state 2025 for year, able to state location and current president but again states poor memory Following commands: Intact       Cueing       General Comments General comments (skin integrity, edema, etc.): pt on RA with SpO2 92-97% throughout session, cued for pursed lip breathing as pt appears SOB and fatigued with mobility    Exercises     Assessment/Plan    PT Assessment Patient needs continued PT services  PT Problem List Decreased strength;Decreased range of motion;Decreased activity tolerance;Decreased balance;Decreased mobility;Decreased cognition;Decreased  knowledge of use of DME;Decreased safety awareness;Cardiopulmonary status limiting activity       PT Treatment Interventions DME instruction;Gait training;Functional mobility training;Therapeutic activities;Therapeutic exercise;Balance training;Cognitive remediation;Patient/family education    PT Goals (Current goals can be found in the Care Plan section)  Acute Rehab PT Goals Patient Stated Goal: "I want to feel better" PT Goal Formulation: With patient Time For Goal Achievement: 09/12/23 Potential to Achieve Goals: Good    Frequency Min 3X/week     Co-evaluation               AM-PAC PT "6 Clicks" Mobility  Outcome Measure Help needed turning from your back to your side while in a flat bed without using bedrails?: A Little Help needed moving from lying on your back to sitting on the side of a flat bed without using bedrails?: A Little Help needed moving to and from a bed to a chair (including a wheelchair)?: A Little Help needed standing up from a chair using your arms (e.g., wheelchair or bedside chair)?: A Little Help needed to walk in hospital room?: A Lot Help needed climbing 3-5 steps with a railing? : Total 6 Click Score: 15  End of Session Equipment Utilized During Treatment: Gait belt Activity Tolerance: Patient tolerated treatment well;Patient limited by fatigue Patient left: in chair;with call bell/phone within reach;with chair alarm set Nurse Communication: Mobility status PT Visit Diagnosis: Unsteadiness on feet (R26.81);Muscle weakness (generalized) (M62.81)    Time: 1001-1035 PT Time Calculation (min) (ACUTE ONLY): 34 min   Charges:   PT Evaluation $PT Eval Moderate Complexity: 1 Mod PT Treatments $Therapeutic Activity: 8-22 mins PT General Charges $$ ACUTE PT VISIT: 1 Visit         Tori Evart Mcdonnell PT, DPT 08/29/23, 11:39 AM

## 2023-08-29 NOTE — Progress Notes (Signed)
 PROGRESS NOTE  Brian Ruiz ZOX:[id number] DOB: [birth year] DOA: 08/28/2023 PCP: [facility]   LOS: 0 days   Brief Narrative / Interim history: 78 year old male with HTN, depression/anxiety, chronic pain syndrome with opioid dependence who comes into the hospital with generalized weakness.  He has been having some congestion in his chest, subjective fevers, but most importantly has been weak and he has been having difficulties ambulating.  Chest x-ray in the ER negative for pneumonia, but he was found to be COVID-positive.  He was admitted to the hospital  Subjective / 24h Interval events: He is feeling better, denies any shortness of breath, no cough or chest congestion this morning.  His main complaint is generalized weakness  Assesement and Plan: Principal Problem:   Acute respiratory failure with hypoxia (HCC) Active Problems:   COVID-19 virus infection   Hypertension   Chronic back pain   Depression with anxiety  Principal problem Acute respiratory failure with hypoxia due to COVID-19 - SpO2 84% on RA on arrival to ED, improved on 3-4 L O2 via [place].  He appears to be on room air this morning, but per night RN did go into the upper 80s and is supposed to be on 2 L -There is no evidence of pneumonia on chest x-ray -Continue breathing treatments, hold further antibiotics, but will do steroids given borderline hypoxia  Active problems Generalized weakness -Patient initially came in due to feeling weak in his legs and having difficulty ambulating.  Likely deconditioning in setting of COVID-19 viral infection.  No focal deficit on admission. PT eval pending today    Hypertension - Continue Coreg.   Depression/anxiety - Continue sertraline.   Chronic pain syndrome -continue Dilaudid as per home regimen, PDMP reviewed.    Scheduled Meds:  carvedilol  12.5 mg Oral BID   enoxaparin (LOVENOX) injection  40 mg Subcutaneous Q24H   methylPREDNISolone (SOLU-MEDROL) injection  40  mg Intravenous Q12H   sertraline  25 mg Oral Daily   sodium chloride flush  3 mL Intravenous Q12H   Continuous Infusions: PRN Meds:.acetaminophen **OR** acetaminophen, guaiFENesin, HYDROmorphone, ipratropium-albuterol, ondansetron **OR** ondansetron (ZOFRAN) IV, mouth rinse, senna-docusate  Current Outpatient Medications  Medication Instructions   acetaminophen (TYLENOL) 1,000 mg, Every 8 hours PRN   Artificial Tear Ointment (DRY EYES OP) 1 drop, Daily PRN   carvedilol (COREG) 12.5 MG tablet 1 tablet, 2 times daily   gabapentin (NEURONTIN) 600 mg, 2 times daily   HYDROmorphone (DILAUDID) 4 MG tablet 1 tablet, Every 8 hours   omeprazole (PRILOSEC) 40 mg, Daily   senna-docusate (SENOKOT-S) 8.6-50 MG tablet 2 tablets, Oral, 2 times daily PRN   sertraline (ZOLOFT) 75 mg, Daily    Diet Orders (From admission, onward)     Start     Ordered   08/28/23 2242  Diet regular Room service appropriate? Yes; Fluid consistency: Thin  Diet effective now       Question Answer Comment  Room service appropriate? Yes   Fluid consistency: Thin      08/28/23 2243            DVT prophylaxis: enoxaparin (LOVENOX) injection 40 mg Start: 08/29/23 1000   Lab Results  Component Value Date   PLT 160 08/29/2023      Code Status: Limited: Do not attempt resuscitation (DNR) -DNR-LIMITED -Do Not Intubate/DNI   Family Communication: no family at bedside   Status is: Observation The patient will require care spanning > 2 midnights and should be moved to inpatient because:  Requires oxygen, unable to ambulate  Level of care: Telemetry  Consultants:  none  Objective: Vitals:   08/28/23 2030 08/28/23 2310 08/29/23 0441 08/29/23 0727  BP: (!) 153/106 (!) 157/92 (!) 146/71 (!) 118/100  Pulse: (!) 125 77 65 65  Resp: 17 20 17    Temp:  98.4 F (36.9 C) 99 F (37.2 C) 98.5 F (36.9 C)  TempSrc:  Oral Oral   SpO2: 96% 96% 99% 94%  Weight:      Height:        Intake/Output Summary (Last 24  hours) at 08/29/2023 0945 Last data filed at 08/29/2023 0944 Gross per 24 hour  Intake 2470 ml  Output 1001 ml  Net 1469 ml   Wt Readings from Last 3 Encounters:  08/28/23 84.6 kg  11/15/20 92.4 kg  05/16/18 88.6 kg    Examination:  Constitutional: NAD Eyes: no scleral icterus ENMT: Mucous membranes are moist.  Neck: normal, supple Respiratory: clear to auscultation bilaterally, no wheezing, no crackles. Normal respiratory effort.  Cardiovascular: Regular rate and rhythm, no murmurs / rubs / gallops. No LE edema.  Abdomen: non distended, no tenderness. Bowel sounds positive.  Musculoskeletal: no clubbing / cyanosis.   Data Reviewed: I have independently reviewed following labs and imaging studies   CBC Recent Labs  Lab 08/28/23 1623 08/29/23 0542  WBC 7.5 8.6  HGB 13.2 11.9*  HCT 41.5 37.6*  PLT 190 160  MCV 96.1 97.2  MCH 30.6 30.7  MCHC 31.8 31.6  RDW 12.8 13.1  LYMPHSABS 0.3*  --   MONOABS 0.8  --   EOSABS 0.0  --   BASOSABS 0.0  --     Recent Labs  Lab 08/28/23 1623 08/28/23 2033 08/29/23 0542  NA 132*  --  133*  K 4.2  --  4.0  CL 97*  --  101  CO2 24  --  23  GLUCOSE 125*  --  92  BUN 20  --  15  CREATININE 1.10  --  0.96  CALCIUM 9.5  --  8.9  AST 21  --   --   ALT 13  --   --   ALKPHOS 60  --   --   BILITOT 0.6  --   --   ALBUMIN 4.3  --   --   LATICACIDVEN  --  2.2*  --   INR 1.0  --   --     ------------------------------------------------------------------------------------------------------------------ No results for input(s): "CHOL", "HDL", "LDLCALC", "TRIG", "CHOLHDL", "LDLDIRECT" in the last 72 hours.  No results found for: "HGBA1C" ------------------------------------------------------------------------------------------------------------------ No results for input(s): "TSH", "T4TOTAL", "T3FREE", "THYROIDAB" in the last 72 hours.  Invalid input(s): "FREET3"  Cardiac Enzymes No results for input(s): "CKMB", "TROPONINI",  "MYOGLOBIN" in the last 168 hours.  Invalid input(s): "CK" ------------------------------------------------------------------------------------------------------------------ No results found for: "BNP"  CBG: No results for input(s): "GLUCAP" in the last 168 hours.  Recent Results (from the past 240 hours)  Blood Culture (routine x 2)     Status: None (Preliminary result)   Collection Time: 08/28/23  4:15 PM   Specimen: BLOOD  Result Value Ref Range Status   Specimen Description   Final    BLOOD LEFT ANTECUBITAL Performed at [facility], [address] [place], [place] [address]    Special Requests   Final    BOTTLES DRAWN AEROBIC AND ANAEROBIC Blood Culture results may not be optimal due to an inadequate volume of blood received in culture bottles Performed at  [facility], [address], [place], [place] [address]    Culture   Final    NO GROWTH < 24 HOURS Performed at [facility], [address], [place], [place] [address]    Report Status PENDING  Incomplete  Blood Culture (routine x 2)     Status: None (Preliminary result)   Collection Time: 08/28/23  4:18 PM   Specimen: BLOOD LEFT WRIST  Result Value Ref Range Status   Specimen Description   Final    BLOOD LEFT WRIST Performed at [facility], [address], [place], [place] [address]    Special Requests   Final    BOTTLES DRAWN AEROBIC AND ANAEROBIC Blood Culture results may not be optimal due to an inadequate volume of blood received in culture bottles Performed at [facility], [address] [place], [place] [address]    Culture   Final    NO GROWTH < 24 HOURS Performed at [facility], [address], [place], [place] [address]    Report Status PENDING  Incomplete  Resp panel by RT-PCR (RSV, Flu A&B, Covid) Anterior Nasal Swab     Status: Abnormal   Collection Time: 08/28/23  5:04 PM   Specimen: Anterior Nasal Swab  Result Value  Ref Range Status   SARS Coronavirus 2 by RT PCR POSITIVE (A) NEGATIVE Final    Comment: (NOTE) SARS-CoV-2 target nucleic acids are DETECTED.  The SARS-CoV-2 RNA is generally detectable in upper respiratory specimens during the acute phase of infection. Positive results are indicative of the presence of the identified virus, but do not rule out bacterial infection or co-infection with other pathogens not detected by the test. Clinical correlation with patient history and other diagnostic information is necessary to determine patient infection status. The expected result is Negative.  Fact Sheet for Patients: BloggerCourse.com  Fact Sheet for Healthcare Providers: SeriousBroker.it  This test is not yet approved or cleared by the [place] FDA and  has been authorized for detection and/or diagnosis of SARS-CoV-2 by FDA under an Emergency Use Authorization (EUA).  This EUA will remain in effect (meaning this test can be used) for the duration of  the COVID-19 declaration under Section 564(b)(1) of the A ct, 21 U.S.C. section 360bbb-3(b)(1), unless the authorization is terminated or revoked sooner.     Influenza A by PCR NEGATIVE NEGATIVE Final   Influenza B by PCR NEGATIVE NEGATIVE Final    Comment: (NOTE) The Xpert Xpress SARS-CoV-2/FLU/RSV plus assay is intended as an aid in the diagnosis of influenza from Nasopharyngeal swab specimens and should not be used as a sole basis for treatment. Nasal washings and aspirates are unacceptable for Xpert Xpress SARS-CoV-2/FLU/RSV testing.  Fact Sheet for Patients: BloggerCourse.com  Fact Sheet for Healthcare Providers: SeriousBroker.it  This test is not yet approved or cleared by the [place] FDA and has been authorized for detection and/or diagnosis of SARS-CoV-2 by FDA under an Emergency Use Authorization (EUA). This EUA will  remain in effect (meaning this test can be used) for the duration of the COVID-19 declaration under Section 564(b)(1) of the Act, 21 U.S.C. section 360bbb-3(b)(1), unless the authorization is terminated or revoked.     Resp Syncytial Virus by PCR NEGATIVE NEGATIVE Final    Comment: (NOTE) Fact Sheet for Patients: BloggerCourse.com  Fact Sheet for Healthcare Providers: SeriousBroker.it  This test is not yet approved or cleared by the [place] and has been authorized  for detection and/or diagnosis of SARS-CoV-2 by FDA under an Emergency Use Authorization (EUA). This EUA will remain in effect (meaning this test can be used) for the duration of the COVID-19 declaration under Section 564(b)(1) of the Act, 21 U.S.C. section 360bbb-3(b)(1), unless the authorization is terminated or revoked.  Performed at [facility], [address] [place], [place] [address]      Radiology Studies: [facility] Chest Port 1 View Result Date: 08/28/2023 CLINICAL DATA:  Possible sepsis. EXAM: PORTABLE CHEST 1 VIEW COMPARISON:  11/10/2020.  Abdomen and pelvis CT dated 11/10/2020. FINDINGS: Stable markedly elevated right hemidiaphragm with adjacent right basilar atelectasis. Interval small amount of left basilar atelectasis. No airspace consolidation suspicious for pneumonia. Normal-sized heart. Tortuous and partially calcified thoracic aorta. Diffuse osteopenia. IMPRESSION: 1. Interval small amount of left basilar atelectasis. 2. Stable markedly elevated right hemidiaphragm with adjacent right basilar atelectasis. 3. No evidence of pneumonia. Electronically Signed   By: Beckie Salts M.D.   On: 08/28/2023 16:41     Pamella Pert, MD, PhD [facility]  Between 7 am - 7 pm I am available, please contact me via Amion (for emergencies) or Securechat (non urgent messages)  Between 7 pm - 7 am I am not available, please contact night  coverage MD/APP via Amion

## 2023-08-30 ENCOUNTER — Inpatient Hospital Stay (HOSPITAL_COMMUNITY)

## 2023-08-30 ENCOUNTER — Telehealth (HOSPITAL_COMMUNITY): Payer: Self-pay

## 2023-08-30 DIAGNOSIS — I5033 Acute on chronic diastolic (congestive) heart failure: Secondary | ICD-10-CM

## 2023-08-30 DIAGNOSIS — U071 COVID-19: Secondary | ICD-10-CM | POA: Diagnosis not present

## 2023-08-30 DIAGNOSIS — G9341 Metabolic encephalopathy: Secondary | ICD-10-CM | POA: Insufficient documentation

## 2023-08-30 DIAGNOSIS — J9601 Acute respiratory failure with hypoxia: Secondary | ICD-10-CM | POA: Diagnosis not present

## 2023-08-30 DIAGNOSIS — I10 Essential (primary) hypertension: Secondary | ICD-10-CM

## 2023-08-30 DIAGNOSIS — F418 Other specified anxiety disorders: Secondary | ICD-10-CM

## 2023-08-30 LAB — COMPREHENSIVE METABOLIC PANEL WITH GFR
ALT: 35 U/L (ref 0–44)
AST: 138 U/L — ABNORMAL HIGH (ref 15–41)
Albumin: 3.9 g/dL (ref 3.5–5.0)
Alkaline Phosphatase: 49 U/L (ref 38–126)
Anion gap: 14 (ref 5–15)
BUN: 15 mg/dL (ref 8–23)
CO2: 21 mmol/L — ABNORMAL LOW (ref 22–32)
Calcium: 9.4 mg/dL (ref 8.9–10.3)
Chloride: 99 mmol/L (ref 98–111)
Creatinine, Ser: 1.03 mg/dL (ref 0.61–1.24)
GFR, Estimated: >60 mL/min (ref >60)
Glucose, Bld: 143 mg/dL — ABNORMAL HIGH (ref 70–99)
Potassium: 3.5 mmol/L (ref 3.5–5.1)
Sodium: 134 mmol/L — ABNORMAL LOW (ref 135–145)
Total Bilirubin: 0.9 mg/dL (ref 0.0–1.2)
Total Protein: 7.4 g/dL (ref 6.5–8.1)

## 2023-08-30 LAB — BLOOD GAS, VENOUS
Acid-base deficit: 0.4 mmol/L (ref 0.0–2.0)
Bicarbonate: 24.8 mmol/L (ref 20.0–28.0)
O2 Saturation: 27.1 %
Patient temperature: 36.7
pCO2, Ven: 41 mmHg — ABNORMAL LOW (ref 44–60)
pH, Ven: 7.38 (ref 7.25–7.43)
pO2, Ven: <31 mmHg — CL (ref 32–45)

## 2023-08-30 LAB — RENAL FUNCTION PANEL
Albumin: 4.1 g/dL (ref 3.5–5.0)
Anion gap: 14 (ref 5–15)
BUN: 21 mg/dL (ref 8–23)
CO2: 21 mmol/L — ABNORMAL LOW (ref 22–32)
Calcium: 9.1 mg/dL (ref 8.9–10.3)
Chloride: 96 mmol/L — ABNORMAL LOW (ref 98–111)
Creatinine, Ser: 1.19 mg/dL (ref 0.61–1.24)
GFR, Estimated: >60 mL/min (ref >60)
Glucose, Bld: 141 mg/dL — ABNORMAL HIGH (ref 70–99)
Phosphorus: 4.4 mg/dL (ref 2.5–4.6)
Potassium: 3.9 mmol/L (ref 3.5–5.1)
Sodium: 131 mmol/L — ABNORMAL LOW (ref 135–145)

## 2023-08-30 LAB — PROCALCITONIN: Procalcitonin: 0.16 ng/mL

## 2023-08-30 LAB — CBC
HCT: 43.4 % (ref 39.0–52.0)
Hemoglobin: 14.2 g/dL (ref 13.0–17.0)
MCH: 31 pg (ref 26.0–34.0)
MCHC: 32.7 g/dL (ref 30.0–36.0)
MCV: 94.8 fL (ref 80.0–100.0)
Platelets: 200 10*3/uL (ref 150–400)
RBC: 4.58 MIL/uL (ref 4.22–5.81)
RDW: 13 % (ref 11.5–15.5)
WBC: 14.9 10*3/uL — ABNORMAL HIGH (ref 4.0–10.5)
nRBC: 0 % (ref 0.0–0.2)

## 2023-08-30 LAB — MAGNESIUM
Magnesium: 1.8 mg/dL (ref 1.7–2.4)
Magnesium: 1.8 mg/dL (ref 1.7–2.4)

## 2023-08-30 LAB — PHOSPHORUS: Phosphorus: 4 mg/dL (ref 2.5–4.6)

## 2023-08-30 LAB — BRAIN NATRIURETIC PEPTIDE: B Natriuretic Peptide: 2770.5 pg/mL — ABNORMAL HIGH (ref 0.0–100.0)

## 2023-08-30 LAB — GLUCOSE, CAPILLARY: Glucose-Capillary: 133 mg/dL — ABNORMAL HIGH (ref 70–99)

## 2023-08-30 LAB — LACTIC ACID, PLASMA: Lactic Acid, Venous: 1.8 mmol/L (ref 0.5–1.9)

## 2023-08-30 LAB — C-REACTIVE PROTEIN: CRP: 4.5 mg/dL — ABNORMAL HIGH (ref <1.0)

## 2023-08-30 LAB — HIV ANTIBODY (ROUTINE TESTING W REFLEX): HIV Screen 4th Generation wRfx: NONREACTIVE

## 2023-08-30 MED ORDER — PREDNISONE 20 MG PO TABS: 40.0000 mg | ORAL_TABLET | Freq: Every day | ORAL | Status: DC

## 2023-08-30 MED ORDER — HYDROXYZINE HCL 10 MG PO TABS
10.0000 mg | ORAL_TABLET | Freq: Once | ORAL | Status: AC | PRN
  Administered 2023-08-30: 10 mg via ORAL
  Filled 2023-08-30: qty 1

## 2023-08-30 MED ORDER — MORPHINE SULFATE (PF) 2 MG/ML IV SOLN
2.0000 mg | INTRAVENOUS | Status: DC | PRN
  Filled 2023-08-30: qty 1

## 2023-08-30 MED ORDER — FUROSEMIDE 10 MG/ML IJ SOLN
20.0000 mg | Freq: Once | INTRAMUSCULAR | Status: AC
  Administered 2023-08-30: 20 mg via INTRAVENOUS
  Filled 2023-08-30: qty 2

## 2023-08-30 MED ORDER — NIRMATRELVIR/RITONAVIR (PAXLOVID)TABLET
3.0000 | ORAL_TABLET | Freq: Two times a day (BID) | ORAL | Status: AC
  Administered 2023-08-30 – 2023-09-04 (×10): 3 via ORAL
  Filled 2023-08-30 (×2): qty 30

## 2023-08-30 MED ORDER — DEXAMETHASONE SODIUM PHOSPHATE 10 MG/ML IJ SOLN
6.0000 mg | Freq: Every day | INTRAMUSCULAR | Status: DC
  Administered 2023-08-30 – 2023-09-06 (×8): 6 mg via INTRAVENOUS
  Filled 2023-08-30 (×5): qty 0.6
  Filled 2023-08-30 (×2): qty 1
  Filled 2023-08-30: qty 0.6

## 2023-08-30 MED ORDER — SODIUM CHLORIDE 0.9 % IV SOLN
500.0000 mg | INTRAVENOUS | Status: DC
  Administered 2023-08-30: 500 mg via INTRAVENOUS
  Filled 2023-08-30: qty 5

## 2023-08-30 MED ORDER — SODIUM CHLORIDE 0.9 % IV SOLN
2.0000 g | INTRAVENOUS | Status: DC
  Administered 2023-08-30: 2 g via INTRAVENOUS
  Filled 2023-08-30: qty 20

## 2023-08-30 MED ORDER — PANTOPRAZOLE SODIUM 40 MG PO TBEC
40.0000 mg | DELAYED_RELEASE_TABLET | Freq: Every day | ORAL | Status: DC
  Administered 2023-08-30 – 2023-09-08 (×10): 40 mg via ORAL
  Filled 2023-08-30 (×10): qty 1

## 2023-08-30 MED ORDER — GABAPENTIN 300 MG PO CAPS
600.0000 mg | ORAL_CAPSULE | Freq: Two times a day (BID) | ORAL | Status: DC
  Administered 2023-08-30 (×2): 600 mg via ORAL
  Filled 2023-08-30 (×2): qty 2

## 2023-08-30 MED ORDER — IPRATROPIUM-ALBUTEROL 0.5-2.5 (3) MG/3ML IN SOLN
3.0000 mL | RESPIRATORY_TRACT | Status: DC | PRN
  Administered 2023-08-30 – 2023-08-31 (×3): 3 mL via RESPIRATORY_TRACT
  Filled 2023-08-30 (×3): qty 3

## 2023-08-30 NOTE — Assessment & Plan Note (Signed)
See assessment and plan above

## 2023-08-30 NOTE — Assessment & Plan Note (Signed)
 Continuing home regimen of Coreg

## 2023-08-30 NOTE — Assessment & Plan Note (Signed)
Continue as needed Dilaudid ?

## 2023-08-30 NOTE — Progress Notes (Signed)
 PROGRESS NOTE   Brian Ruiz  WUJ:[id number] DOB: [birth year] DOA: 08/28/2023 PCP: [facility]   Date of Service: the patient was seen and examined on 08/30/2023  Brief Narrative:  Brian Ruiz is a 78 y.o. male with medical history significant for HTN, depression/anxiety, chronic pain syndrome with opioid dependence who presented to [facility] on 3/31 with complaints of generalized weakness and shortness of breath.    Upon evaluation in the emergency department patient was found to be positive for COVID-19 via PCR.  Patient was additionally found to be hypoxic requiring initiation of supplemental oxygen.  The hospitalist group was called to assess the patient for admission to the hospital.    Patient was managed with systemic steroids and supportive care in the days that followed.      Assessment & Plan Acute respiratory failure with hypoxia (HCC) presumably secondary to COVID-19 infection although it is possible that acute pulmonary edema or even a superimposed bacterial pneumonia are contributing Repeat chest x-ray reveals worsening patchy infiltrates Supplemental oxygen to keep ox saturations at least 92% Placing patient on Paxlovid for total 5 days Transitioning steroids to dexamethasone 6 mg daily Will treat with concurrent intravenous ceftriaxone and azithromycin for now and obtain procalcitonin to identify as to whether there is concurrent bacterial infection Acute metabolic encephalopathy Substantial confusion, likely secondary to waxing and waning hypoxia due to respiratory failure Treating underlying condition, monitoring for improvement Acute on chronic diastolic CHF (congestive heart failure) (HCC) Echocardiogram performed in 2019 revealing grade 2 diastolic dysfunction with preserved ejection fraction It is possible that acute diastolic failure is contributing to patient's respiratory status via acute cardiogenic pulmonary edema Obtaining BNP,  echocardiogram Giving trial dose of Lasix COVID-19 virus infection See assessment and plan above Essential hypertension Continuing home regimen of Coreg Chronic back pain Continue as needed Dilaudid Depression with anxiety Continue home regimen of Zoloft 25 mg 3 times daily   Subjective:  Patient complaining of shortness of breath, states that it is worse than yesterday.  Patient reports the shortness of breath is moderate to severe in intensity.  Patient denies any associated chest pain.  Patient is complaining of associated cough.  Nursing reports the patient's associated confusion is worse than yesterday.  Physical Exam:  Vitals:   08/29/23 2027 08/30/23 0043 08/30/23 0507 08/30/23 0813  BP: (!) 169/95 (!) 163/99 (!) 146/97 113/81  Pulse: 98 88 (!) 101 100  Resp: (!) 22 20 (!) 24 17  Temp: 98.3 F (36.8 C) 98.6 F (37 C) 98.2 F (36.8 C) 98 F (36.7 C)  TempSrc:  Oral Oral   SpO2: 94% 98% 96% 93%  Weight:      Height:        Constitutional: Awake alert and oriented x3, patient is visibly anxious and in mild respiratory distress. Skin: no rashes, no lesions, poor skin turgor noted. Eyes: Pupils are equally reactive to light.  No evidence of scleral icterus or conjunctival pallor.  ENMT: Moist mucous membranes noted.  Posterior pharynx clear of any exudate or lesions.   Respiratory: Notable diffuse rales throughout the left lung fields with intermittent expiratory wheezing noted.  Normal respiratory effort. No accessory muscle use.  Cardiovascular: Regular rate and rhythm, no murmurs / rubs / gallops. No extremity edema. 2+ pedal pulses. No carotid bruits.  Abdomen: Abdomen is soft and nontender.  No evidence of intra-abdominal masses.  Positive bowel sounds noted in all quadrants.   Musculoskeletal: Significant pain with both passive and active  range of motion of the bilateral hips.  No significant deformities noted.   Data Reviewed:  I have personally reviewed and  interpreted labs, imaging.  Significant findings are   CBC: Recent Labs  Lab 08/28/23 1623 08/29/23 0542 08/30/23 0537  WBC 7.5 8.6 14.9*  NEUTROABS 6.4  --   --   HGB 13.2 11.9* 14.2  HCT 41.5 37.6* 43.4  MCV 96.1 97.2 94.8  PLT 190 160 200   Basic Metabolic Panel: Recent Labs  Lab 08/28/23 1623 08/29/23 0542 08/30/23 0537  NA 132* 133* 134*  K 4.2 4.0 3.5  CL 97* 101 99  CO2 24 23 21*  GLUCOSE 125* 92 143*  BUN 20 15 15   CREATININE 1.10 0.96 1.03  CALCIUM 9.5 8.9 9.4  MG  --   --  1.8  PHOS  --   --  4.0   GFR: Estimated Creatinine Clearance: 65.9 mL/min (by C-G formula based on SCr of 1.03 mg/dL). Liver Function Tests: Recent Labs  Lab 08/28/23 1623 08/30/23 0537  AST 21 138*  ALT 13 35  ALKPHOS 60 49  BILITOT 0.6 0.9  PROT 7.3 7.4  ALBUMIN 4.3 3.9    Coagulation Profile: Recent Labs  Lab 08/28/23 1623  INR 1.0     EKG: Personally reviewed.  Rhythm is 80 bpm with heart rate of sinus rhythm.  No dynamic ST segment changes appreciated.   Code Status:  DNR.  Code status decision has been confirmed with: wife via phone conversation Family Communication: Plan of care discussed with wife via phone conversation.   Severity of Illness:  The appropriate patient status for this patient is INPATIENT. Inpatient status is judged to be reasonable and necessary in order to provide the required intensity of service to ensure the patient's safety. The patient's presenting symptoms, physical exam findings, and initial radiographic and laboratory data in the context of their chronic comorbidities is felt to place them at high risk for further clinical deterioration. Furthermore, it is not anticipated that the patient will be medically stable for discharge from the hospital within 2 midnights of admission.   * I certify that at the point of admission it is my clinical judgment that the patient will require inpatient hospital care spanning beyond 2 midnights from the  point of admission due to high intensity of service, high risk for further deterioration and high frequency of surveillance required.*  Time spent:  52 minutes  Author:  Marinda Elk MD  08/30/2023 8:21 AM

## 2023-08-30 NOTE — Assessment & Plan Note (Signed)
 presumably secondary to COVID-19 infection although it is possible that acute pulmonary edema or even a superimposed bacterial pneumonia are contributing Repeat chest x-ray reveals worsening patchy infiltrates Supplemental oxygen to keep ox saturations at least 92% Placing patient on Paxlovid for total 5 days Transitioning steroids to dexamethasone 6 mg daily Will treat with concurrent intravenous ceftriaxone and azithromycin for now and obtain procalcitonin to identify as to whether there is concurrent bacterial infection

## 2023-08-30 NOTE — Plan of Care (Signed)
  Problem: Education: Goal: Knowledge of risk factors and measures for prevention of condition will improve Outcome: Progressing   Problem: Coping: Goal: Psychosocial and spiritual needs will be supported Outcome: Progressing   Problem: Education: Goal: Knowledge of General Education information will improve Description: Including pain rating scale, medication(s)/side effects and non-pharmacologic comfort measures Outcome: Progressing   Problem: Clinical Measurements: Goal: Ability to maintain clinical measurements within normal limits will improve Outcome: Progressing

## 2023-08-30 NOTE — Telephone Encounter (Addendum)
 Patient's wife called this morning concerned about her husband's d/c because she is recovering from covid (she could barely speak over the phone between all the coughing), she requested Kirt Boys, OT could please give her a quick call today to discuss some details. She can be reached at [phone] [facility])

## 2023-08-30 NOTE — Progress Notes (Signed)
 OT Note  Patient Details Name: Brian Ruiz MRN: [id number] DOB: Jul 09, 1945   Family phone call per request:    Reason Eval/Treat Not Completed: Other (comment) Patients wife was called today per request of wife to discuss husband. Patient's wife reported that patients right hip is "Crumbles and bone spurs"  with no WB restrictions as of ortho visit 3 years prior. Unable to update recommendations with ortho as patient has been homebound and unable to get to MD visits.Wife reported, patient is home bound at home at baseline.  they have a 1 floor town house with flight of steps to get in and out. Typical day for patient is to get up with RW to bathroom and to living room to lift chair with supervision from wife. Wife preforms bathing and LB ADLs at bed level for patient.  Patient able to participate in toileting and UB dressing himself as well. Wife is there for safety supervision but unable to provide physical A.  Wife reports she wants him to go home when medically stable. Patients wife was educated on patients current assist level and limited to transfers at this time. Patient's wife reported that she would like for patient to be able to walk to bathroom to be able to come home. Patients wife reported they have BSC at home but no w/c.patient's wife was educated on concerns over patient being unable to do stairs for last three years per her report and having a flight to get safely in and out of house. Patients wife verbalized understanding. Patients wife requested a call to discuss patients status and assist levels. Patients wife was reeducated at this time how much assistance patient currently needs for ADLs and transfers. Patients wife verbalized understanding and reported she would figure out how to help him at this level at home.  Patient would benefit from wheelchair in next level of care.  OT to continue to follow while here and work on transfers for toileting tasks.  Brian Loud, MS Acute  Rehabilitation Department Office# [phone] 08/30/2023, 10:11 AM

## 2023-08-30 NOTE — Assessment & Plan Note (Signed)
 Echocardiogram performed in 2019 revealing grade 2 diastolic dysfunction with preserved ejection fraction It is possible that acute diastolic failure is contributing to patient's respiratory status via acute cardiogenic pulmonary edema Obtaining BNP, echocardiogram Giving trial dose of Lasix

## 2023-08-30 NOTE — Plan of Care (Signed)
   Problem: Education: Goal: Knowledge of risk factors and measures for prevention of condition will improve Outcome: Progressing   Problem: Coping: Goal: Psychosocial and spiritual needs will be supported Outcome: Progressing   Problem: Respiratory: Goal: Will maintain a patent airway Outcome: Progressing

## 2023-08-30 NOTE — Assessment & Plan Note (Signed)
 Substantial confusion, likely secondary to waxing and waning hypoxia due to respiratory failure Treating underlying condition, monitoring for improvement

## 2023-08-30 NOTE — TOC Initial Note (Signed)
 Transition of Care [facility]) - Initial/Assessment Note    Patient Details  Name: Brian Ruiz MRN: [id number] Date of Birth: 06/30/1945  Transition of Care [facility]) CM/SW Contact:    Brian Santee, LCSW Phone Number: 08/30/2023, 12:34 PM  Clinical Narrative:                 Pt from home w/ spouse. Pt has RW, cane, and BSC at home. Pt is active with [facility] for home based primary care services. Met with pt who was alert and oriented however, mildly confused when discussing home services. Pt agreeable to having home health arranged and does not have agency preference. Pt agreeable for CSW to reach out to spouse to discuss further.  Spoke with Brian Ruiz who shares that pt has had home health services through [facility] in the past and would like services with them again. HHPT/OT has been arranged with [facility]. HH orders will need to be placed prior to discharge. Per OT note "pt would benefit from wheelchair in next level of care." Per pt's spouse and chart review pt received RW on 10/2020 and would be ineligible to receive another AD through insurance until 2027. Pt's spouse will be unable to provide transportation for pt at discharge and is requesting pt be transported home via ambulance. TOC will continue to follow.  Expected Discharge Plan: Home w Home Health Services Barriers to Discharge: No Barriers Identified   Patient Goals and CMS Choice Patient states their goals for this hospitalization and ongoing recovery are:: To return home CMS Medicare.gov Compare Post Acute Care list provided to:: Patient Choice offered to / list presented to : Patient, Spouse      Expected Discharge Plan and Services In-house Referral: Clinical Social Work Discharge Planning Services: NA Post Acute Care Choice: Home Health Living arrangements for the past 2 months: Single Family Home                 DME Arranged: N/A DME Agency: NA       HH Arranged: PT, OT HH Agency: [facility] Date [facility]  Agency Contacted: 08/30/23 Time HH Agency Contacted: 1231 Representative spoke with at [facility] Agency: Cindie  Prior Living Arrangements/Services Living arrangements for the past 2 months: Single Family Home Lives with:: Spouse Patient language and need for interpreter reviewed:: Yes Do you feel safe going back to the place where you live?: Yes      Need for Family Participation in Patient Care: Yes (Comment) Care giver support system in place?: No (comment) Current home services: DME ([facility] (2 wheels);Cane - single point, [facility]) Criminal Activity/Legal Involvement Pertinent to Current Situation/Hospitalization: No - Comment as needed  Activities of Daily Living   ADL Screening (condition at time of admission) Independently performs ADLs?: Yes (appropriate for developmental age) Is the patient deaf or have difficulty hearing?: Yes Does the patient have difficulty seeing, even when wearing glasses/contacts?: No Does the patient have difficulty concentrating, remembering, or making decisions?: No  Permission Sought/Granted Permission sought to share information with : Family Supports, Oceanographer granted to share information with : Yes, Verbal Permission Granted  Share Information with NAME: Brian Ruiz  Permission granted to share info w AGENCY: [facility]  Permission granted to share info w Relationship: Spouse  Permission granted to share info w Contact Information: [phone]  Emotional Assessment Appearance:: Appears stated age Attitude/Demeanor/Rapport: Engaged Affect (typically observed): Accepting Orientation: : Oriented to Self, Oriented to Place, Oriented to Situation, Oriented  to  Time (Oriented however, mildly confused) Alcohol / Substance Use: Not Applicable Psych Involvement: No (comment)  Admission diagnosis:  Hypoxia [R09.02] Acute respiratory failure with hypoxia (HCC) [J96.01] COVID [U07.1] COVID-19 [U07.1] Patient Active  Problem List   Diagnosis Date Noted   COVID-19 08/29/2023   Acute respiratory failure with hypoxia (HCC) 08/28/2023   COVID-19 virus infection 08/28/2023   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   Depression with anxiety 09/12/2021   Palliative care encounter 08/03/2021   Physical debility 08/03/2021   Severe protein-calorie malnutrition (HCC) 08/03/2021   Chronic fatigue 06/14/2017   Palpitations 06/14/2017   Aortic ectasia (HCC) 11/15/2016   Age-related osteoporosis without current pathological fracture 10/27/2016   Screening for AAA (abdominal aortic aneurysm) 10/27/2016   Screening for prostate cancer 10/27/2016   Need for hepatitis C screening test 10/27/2016   Encounter for general adult medical examination without abnormal findings 11/26/2013   Screening for osteoporosis 11/26/2013   Kyphoscoliosis deformity of spine 07/04/2013   Enlarged prostate with lower urinary tract symptoms (LUTS) 04/12/2013   Screen for colon cancer 04/12/2013   Undiagnosed cardiac murmurs 04/12/2013   Backache 04/12/2013   White coat syndrome with hypertension 04/12/2013   Lumbar spinal stenosis 10/29/2012   DDD (degenerative disc disease), lumbosacral 03/16/2012   Lumbosacral radiculopathy 03/16/2012   Generalized anxiety disorder 03/14/2012   Renal pain 12/16/2011   Sacroiliitis, not elsewhere classified (HCC) 08/11/2011   Sacroiliac joint pain 07/27/2011   Bilateral hip pain 07/07/2011   Chronic pain syndrome 07/07/2011   Spondylolisthesis of lumbosacral region 07/07/2011   Hereditary and idiopathic peripheral neuropathy 05/27/2011   Accidental drug overdose 05/15/2011   Guilty feelings 05/15/2011   Hypertension 05/15/2011   Hypotension 05/15/2011   Bradycardia 05/15/2011   Chronic back pain 05/15/2011   Vitamin D deficiency 03/08/2011   PCP:  [facility], [facility] Pharmacy:   [facility] [phone] [place], [place] - [address] AT [facility] [address] &  [facility] [address] [place] [address] Phone: [phone] Fax: [phone]  [facility] [address] [place] [address] Phone: [phone] Fax: [phone]     Social Drivers of Health (SDOH) Social History: SDOH Screenings   Food Insecurity: No Food Insecurity (08/28/2023)  Housing: Low Risk  (08/28/2023)  Transportation Needs: No Transportation Needs (08/28/2023)  Utilities: Not At Risk (08/28/2023)  Financial Resource Strain: Low Risk  (02/08/2021)   Received from [facility]  Physical Activity: Inactive (02/08/2021)   Received from [facility], [facility]  Social Connections: Socially Integrated (08/28/2023)  Stress: No Stress Concern Present (02/08/2021)   Received from [facility], [facility]  Tobacco Use: Low Risk  (08/28/2023)   SDOH Interventions:     Readmission Risk Interventions    08/30/2023   12:30 PM  Readmission Risk Prevention Plan  Post Dischage Appt Complete  Medication Screening Complete  Transportation Screening Complete

## 2023-08-30 NOTE — Assessment & Plan Note (Signed)
 Continue home regimen of Zoloft 25 mg 3 times daily

## 2023-08-31 ENCOUNTER — Inpatient Hospital Stay (HOSPITAL_COMMUNITY)

## 2023-08-31 ENCOUNTER — Other Ambulatory Visit: Payer: Self-pay

## 2023-08-31 DIAGNOSIS — G9341 Metabolic encephalopathy: Secondary | ICD-10-CM | POA: Diagnosis not present

## 2023-08-31 DIAGNOSIS — I5021 Acute systolic (congestive) heart failure: Secondary | ICD-10-CM

## 2023-08-31 DIAGNOSIS — U071 COVID-19: Secondary | ICD-10-CM | POA: Diagnosis not present

## 2023-08-31 DIAGNOSIS — I214 Non-ST elevation (NSTEMI) myocardial infarction: Secondary | ICD-10-CM | POA: Insufficient documentation

## 2023-08-31 DIAGNOSIS — J9601 Acute respiratory failure with hypoxia: Secondary | ICD-10-CM | POA: Diagnosis not present

## 2023-08-31 DIAGNOSIS — I5043 Acute on chronic combined systolic (congestive) and diastolic (congestive) heart failure: Secondary | ICD-10-CM

## 2023-08-31 LAB — ECHOCARDIOGRAM COMPLETE
AR max vel: 3.92 cm2
AV Peak grad: 2.8 mmHg
Ao pk vel: 0.84 m/s
Area-P 1/2: 10.25 cm2
Calc EF: 37.1 %
Height: 72 in
MV VTI: 4.49 cm2
S' Lateral: 3 cm
Single Plane A2C EF: 39.3 %
Single Plane A4C EF: 37.4 %
Weight: 2985.6 [oz_av]

## 2023-08-31 LAB — CBC WITH DIFFERENTIAL/PLATELET
Abs Immature Granulocytes: 0.08 10*3/uL — ABNORMAL HIGH (ref 0.00–0.07)
Basophils Absolute: 0 10*3/uL (ref 0.0–0.1)
Basophils Relative: 0 %
Eosinophils Absolute: 0 10*3/uL (ref 0.0–0.5)
Eosinophils Relative: 0 %
HCT: 44.7 % (ref 39.0–52.0)
Hemoglobin: 14.3 g/dL (ref 13.0–17.0)
Immature Granulocytes: 1 %
Lymphocytes Relative: 9 %
Lymphs Abs: 1.5 10*3/uL (ref 0.7–4.0)
MCH: 30.8 pg (ref 26.0–34.0)
MCHC: 32 g/dL (ref 30.0–36.0)
MCV: 96.1 fL (ref 80.0–100.0)
Monocytes Absolute: 1.2 10*3/uL — ABNORMAL HIGH (ref 0.1–1.0)
Monocytes Relative: 7 %
Neutro Abs: 14.4 10*3/uL — ABNORMAL HIGH (ref 1.7–7.7)
Neutrophils Relative %: 83 %
Platelets: 199 10*3/uL (ref 150–400)
RBC: 4.65 MIL/uL (ref 4.22–5.81)
RDW: 13 % (ref 11.5–15.5)
WBC: 17.3 10*3/uL — ABNORMAL HIGH (ref 4.0–10.5)
nRBC: 0 % (ref 0.0–0.2)

## 2023-08-31 LAB — BLOOD GAS, ARTERIAL
Acid-base deficit: 4.2 mmol/L — ABNORMAL HIGH (ref 0.0–2.0)
Bicarbonate: 20.1 mmol/L (ref 20.0–28.0)
Drawn by: 72261
O2 Saturation: 99.4 %
Patient temperature: 36.5
pCO2 arterial: 33 mmHg (ref 32–48)
pH, Arterial: 7.39 (ref 7.35–7.45)
pO2, Arterial: 83 mmHg (ref 83–108)

## 2023-08-31 LAB — MRSA NEXT GEN BY PCR, NASAL: MRSA by PCR Next Gen: NOT DETECTED

## 2023-08-31 LAB — COMPREHENSIVE METABOLIC PANEL WITH GFR
ALT: 43 U/L (ref 0–44)
AST: 136 U/L — ABNORMAL HIGH (ref 15–41)
Albumin: 3.4 g/dL — ABNORMAL LOW (ref 3.5–5.0)
Alkaline Phosphatase: 41 U/L (ref 38–126)
Anion gap: 12 (ref 5–15)
BUN: 29 mg/dL — ABNORMAL HIGH (ref 8–23)
CO2: 23 mmol/L (ref 22–32)
Calcium: 8.6 mg/dL — ABNORMAL LOW (ref 8.9–10.3)
Chloride: 96 mmol/L — ABNORMAL LOW (ref 98–111)
Creatinine, Ser: 1.36 mg/dL — ABNORMAL HIGH (ref 0.61–1.24)
GFR, Estimated: 54 mL/min — ABNORMAL LOW (ref >60)
Glucose, Bld: 118 mg/dL — ABNORMAL HIGH (ref 70–99)
Potassium: 3.8 mmol/L (ref 3.5–5.1)
Sodium: 131 mmol/L — ABNORMAL LOW (ref 135–145)
Total Bilirubin: 0.6 mg/dL (ref 0.0–1.2)
Total Protein: 6.2 g/dL — ABNORMAL LOW (ref 6.5–8.1)

## 2023-08-31 LAB — MAGNESIUM: Magnesium: 2 mg/dL (ref 1.7–2.4)

## 2023-08-31 LAB — PROCALCITONIN: Procalcitonin: 0.27 ng/mL

## 2023-08-31 LAB — LACTIC ACID, PLASMA: Lactic Acid, Venous: 2.1 mmol/L (ref 0.5–1.9)

## 2023-08-31 LAB — TSH: TSH: 1.76 u[IU]/mL (ref 0.350–4.500)

## 2023-08-31 LAB — TROPONIN I (HIGH SENSITIVITY)
Troponin I (High Sensitivity): 5919 ng/L (ref <18)
Troponin I (High Sensitivity): 5340 ng/L (ref <18)
Troponin I (High Sensitivity): 4534 ng/L (ref <18)

## 2023-08-31 LAB — BRAIN NATRIURETIC PEPTIDE: B Natriuretic Peptide: 1951.7 pg/mL — ABNORMAL HIGH (ref 0.0–100.0)

## 2023-08-31 LAB — HEPARIN LEVEL (UNFRACTIONATED): Heparin Unfractionated: 0.96 [IU]/mL — ABNORMAL HIGH (ref 0.30–0.70)

## 2023-08-31 MED ORDER — SODIUM CHLORIDE 0.9 % IV SOLN: INTRAVENOUS | Status: DC

## 2023-08-31 MED ORDER — AMIODARONE HCL IN DEXTROSE 360-4.14 MG/200ML-% IV SOLN
60.0000 mg/h | INTRAVENOUS | Status: DC
Start: 2023-08-31 — End: 2023-09-05
  Administered 2023-08-31 – 2023-09-01 (×4): 30 mg/h via INTRAVENOUS
  Administered 2023-09-02 – 2023-09-05 (×9): 60 mg/h via INTRAVENOUS
  Filled 2023-08-31 (×15): qty 200

## 2023-08-31 MED ORDER — PERFLUTREN LIPID MICROSPHERE
1.0000 mL | INTRAVENOUS | Status: AC | PRN
  Administered 2023-08-31: 2 mL via INTRAVENOUS

## 2023-08-31 MED ORDER — FENTANYL CITRATE PF 50 MCG/ML IJ SOSY
25.0000 ug | PREFILLED_SYRINGE | Freq: Once | INTRAMUSCULAR | Status: AC
  Administered 2023-08-31: 25 ug via INTRAVENOUS
  Filled 2023-08-31: qty 1

## 2023-08-31 MED ORDER — MENTHOL 3 MG MT LOZG
1.0000 | LOZENGE | OROMUCOSAL | Status: DC | PRN
  Administered 2023-08-31 – 2023-09-03 (×5): 3 mg via ORAL
  Filled 2023-08-31 (×3): qty 9

## 2023-08-31 MED ORDER — METOPROLOL TARTRATE 5 MG/5ML IV SOLN
2.5000 mg | INTRAVENOUS | Status: DC | PRN
  Administered 2023-08-31 (×2): 2.5 mg via INTRAVENOUS
  Filled 2023-08-31 (×2): qty 5

## 2023-08-31 MED ORDER — HEPARIN (PORCINE) 25000 UT/250ML-% IV SOLN
1000.0000 [IU]/h | INTRAVENOUS | Status: DC
Start: 2023-08-31 — End: 2023-09-01
  Administered 2023-08-31: 1150 [IU]/h via INTRAVENOUS
  Administered 2023-09-01: 1000 [IU]/h via INTRAVENOUS
  Filled 2023-08-31 (×2): qty 250

## 2023-08-31 MED ORDER — POTASSIUM CHLORIDE CRYS ER 20 MEQ PO TBCR
40.0000 meq | EXTENDED_RELEASE_TABLET | Freq: Once | ORAL | Status: AC
  Administered 2023-08-31: 40 meq via ORAL
  Filled 2023-08-31: qty 2

## 2023-08-31 MED ORDER — METOPROLOL TARTRATE 5 MG/5ML IV SOLN
5.0000 mg | Freq: Four times a day (QID) | INTRAVENOUS | Status: DC
  Administered 2023-08-31: 5 mg via INTRAVENOUS
  Filled 2023-08-31: qty 5

## 2023-08-31 MED ORDER — METOPROLOL TARTRATE 25 MG PO TABS: 25.0000 mg | ORAL_TABLET | Freq: Two times a day (BID) | ORAL | Status: DC

## 2023-08-31 MED ORDER — NITROGLYCERIN 0.4 MG SL SUBL: 0.4000 mg | SUBLINGUAL_TABLET | SUBLINGUAL | Status: DC | PRN

## 2023-08-31 MED ORDER — METOPROLOL TARTRATE 5 MG/5ML IV SOLN: 5.0000 mg | Freq: Four times a day (QID) | INTRAVENOUS | Status: DC

## 2023-08-31 MED ORDER — GABAPENTIN 300 MG PO CAPS
600.0000 mg | ORAL_CAPSULE | Freq: Two times a day (BID) | ORAL | Status: DC | PRN
  Administered 2023-09-02 – 2023-09-07 (×2): 600 mg via ORAL
  Filled 2023-08-31 (×3): qty 2

## 2023-08-31 MED ORDER — METOPROLOL TARTRATE 5 MG/5ML IV SOLN: 2.5000 mg | Freq: Four times a day (QID) | INTRAVENOUS | Status: DC

## 2023-08-31 MED ORDER — HEPARIN BOLUS VIA INFUSION
4000.0000 [IU] | Freq: Once | INTRAVENOUS | Status: AC
  Administered 2023-08-31: 4000 [IU] via INTRAVENOUS
  Filled 2023-08-31: qty 4000

## 2023-08-31 MED ORDER — DILTIAZEM HCL-DEXTROSE 125-5 MG/125ML-% IV SOLN (PREMIX)
5.0000 mg/h | INTRAVENOUS | Status: DC
  Filled 2023-08-31: qty 125

## 2023-08-31 MED ORDER — AMIODARONE HCL IN DEXTROSE 360-4.14 MG/200ML-% IV SOLN
60.0000 mg/h | INTRAVENOUS | Status: AC
Start: 2023-08-31 — End: 2023-08-31
  Administered 2023-08-31 (×2): 60 mg/h via INTRAVENOUS
  Filled 2023-08-31 (×2): qty 200

## 2023-08-31 MED ORDER — CHLORHEXIDINE GLUCONATE CLOTH 2 % EX PADS
6.0000 | MEDICATED_PAD | Freq: Every day | CUTANEOUS | Status: DC
  Administered 2023-08-31 – 2023-09-08 (×8): 6 via TOPICAL

## 2023-08-31 MED ORDER — AMIODARONE LOAD VIA INFUSION
150.0000 mg | Freq: Once | INTRAVENOUS | Status: AC
  Administered 2023-08-31: 150 mg via INTRAVENOUS
  Filled 2023-08-31: qty 83.34

## 2023-08-31 NOTE — Assessment & Plan Note (Addendum)
 Continue as needed Dilaudid, will abstain from administering if patient is confused.

## 2023-08-31 NOTE — Progress Notes (Signed)
 PT Cancellation Note  Patient Details Name: Brian Ruiz MRN: [id number] DOB: 1945/12/24   Cancelled Treatment:    Reason Eval/Treat Not Completed: Medical issues which prohibited therapy Pt with chest pain and uncontrolled afib earlier.  Moved to SDU this morning.  Will hold therapy today and check back as schedule permits.   [name] 08/31/2023, 9:17 AM Paulino Door, DPT Physical Therapist Acute Rehabilitation Services Office: [phone]

## 2023-08-31 NOTE — Assessment & Plan Note (Addendum)
See assessment and plan above

## 2023-08-31 NOTE — Assessment & Plan Note (Addendum)
 Substantial confusion, likely secondary to waxing and waning hypoxia due to respiratory failure Treating underlying condition, monitoring for improvement

## 2023-08-31 NOTE — Assessment & Plan Note (Addendum)
 Progressively worsening acute hypoxic respiratory failure secondary to uncontrolled rapid atrial fibrillation and NSTEMI in the setting of COVID-19 infection Managing with supplemental oxygen to maintain oxygen saturations of 92 to 96% Patient did respond well to 40 mg of intravenous Lasix given afternoon to 4/2 however today patient appears clinically dry with borderline blood pressures and this has not been repeated.   Patient receiving amiodarone infusion for management of rapid atrial fibrillation Continuing Paxlovid day 2 and dexamethasone for COVID-19 infection As needed bronchodilators Patient was given a short course of intravenous antibiotics due to concerns for possible pneumonia however procalcitonin is unremarkable and therefore this has been discontinued.

## 2023-08-31 NOTE — Significant Event (Signed)
 Rapid Response Event Note   Reason for Call : HR 140's, Chest pain, abnormal EKG results   Initial Focused Assessment:  Patient Aox3, complaints of mild chest pain, no complaints of SOB. NP at bedside, primary nurse obtaining second EKG. EKG confirmed Afib rate uncontrolled. Virgel Manifold, NP placed order for 2.5 of metoprolol and Fentanyl 25 mg. Rapid RN gave at bedside x2, see MAR. Patient HR improved to 90-100's, BP stable. Patient stated chest pain had improved.      Interventions:  2.5 metoprolol x 2  Fentanyl 25 mg once  Plan of Care:  See [facility] for updated medications Continue to monitor, continue telemetry  Event Summary:   MD Notified: Ruffin Pyo, NP Call Time: [phone] Arrival Time: 0621 End Time: 8295  Odette Horns, RN

## 2023-08-31 NOTE — Assessment & Plan Note (Addendum)
 Echocardiogram performed today revealing a newly depressed ejection fraction of 35 to 40% with left ventricular demonstrating regional wall motion abnormalities and severe hypokinesis of the left ventricular mid apical segment anterior wall lateral wall and inferior segment concerning for either large LAD territory ischemia or Takotsubo's cardiomyopathy. Patient responded well to dose of 40 mg of Lasix on 4/2 however considering patient's tenuous blood pressures holding off on additional doses of Lasix for now and instead focusing on rate control

## 2023-08-31 NOTE — Progress Notes (Addendum)
   Plan is to transfer patient to [facility] for [facility] tomorrow. I consented patient for this procedure earlier today. Updateed patient on plan and he is agreeable. Also called wife and updated her. She is also agreeable. Will make patient NPO at midnight and place pre-procedural orders.  The risks [stroke (1 in 1000), death (1 in 1000), kidney failure [usually temporary] (1 in 500), bleeding (1 in 200), allergic reaction [possibly serious] (1 in 200)], benefits (diagnostic support and management of coronary artery disease) and alternatives of a cardiac catheterization were discussed in detail with Mr. Gair and he is willing to proceed.  Wife wanted to make sure daughter Linton Rump Vella Raring) was on the list of people staff can relay messages to. I spoke with patient and confirmed that he is okay with this. Her number is [phone].    Corrin Parker, PA-C 08/31/2023 5:38 PM

## 2023-08-31 NOTE — Assessment & Plan Note (Addendum)
 Continue home regimen of Zoloft 25 mg 3 times daily

## 2023-08-31 NOTE — Assessment & Plan Note (Addendum)
 Home regimen of Coreg has been discontinued, patient now on metoprolol twice daily per cardiology recommendations

## 2023-08-31 NOTE — Progress Notes (Signed)
 PICC order received. VAST will assess on 09/01/23.

## 2023-08-31 NOTE — Progress Notes (Signed)
 PROGRESS NOTE   Brian Ruiz  QMV:[id number] DOB: 01-09-1946 DOA: 08/28/2023 PCP: Bonnetta Barry, [facility]   Date of Service: the patient was seen and examined on 08/31/2023  Brief Narrative:  Brian Ruiz is a 78 y.o. male with medical history significant for HTN, depression/anxiety, chronic pain syndrome with opioid dependence who presented to [facility] on 3/31 with complaints of generalized weakness and shortness of breath.    Upon evaluation in the emergency department patient was found to be positive for COVID-19 via PCR.  Patient was additionally found to be hypoxic requiring initiation of supplemental oxygen.  The hospitalist group was called to assess the patient for admission to the hospital.    Patient was managed with systemic steroids and supportive care in the days that followed.  Hospital course was complicated by increasing respiratory distress and acute hypoxic respiratory failure with associated acute metabolic encephalopathy.  Early the morning of 4/3 patient was identified to be having bouts of rapid atrial fibrillation with heart rates approaching 150 bpm that failed to improve with doses of intravenous metoprolol.  This was associated with increasing chest discomfort.  Further workup of this development revealed a troponin of 5919 concerning for NSTEMI.  Patient was therefore transferred to the stepdown unit, heparin was initiated, an amiodarone infusion was initiated and cardiology was consulted.  By the end of the day on 4/3 the patient continued to decline with decreasing urine output and worsening confusion with poorly managed atrial fibrillation.  At this point cardiology recommended transfer to [facility] with tentative plans for right and left heart catheterization on 4/4.  Patient was therefore transferred to the progressive unit at [facility] for further management.   Assessment & Plan Acute respiratory failure with hypoxia  (HCC) Progressively worsening acute hypoxic respiratory failure secondary to uncontrolled rapid atrial fibrillation and NSTEMI in the setting of COVID-19 infection Managing with supplemental oxygen to maintain oxygen saturations of 92 to 96% Patient did respond well to 40 mg of intravenous Lasix given afternoon to 4/2 however today patient appears clinically dry with borderline blood pressures and this has not been repeated.   Patient receiving amiodarone infusion for management of rapid atrial fibrillation Continuing Paxlovid day 2 and dexamethasone for COVID-19 infection As needed bronchodilators Patient was given a short course of intravenous antibiotics due to concerns for possible pneumonia however procalcitonin is unremarkable and therefore this has been discontinued. NSTEMI (non-ST elevated myocardial infarction) [facility]) Patient began to complain of chest discomfort early in the morning on 4/3 in the midst of rapid atrial fibrillation.   Extremely elevated troponin at 7 AM this morning of 5919, heparin was initiated shortly thereafter Concurrent rapid atrial fibrillation was felt to be exacerbating the degree of the patient's ischemia and therefore amiodarone was initiated And troponins were 5340 and 4534 Cardiology following, their input is appreciated Aspirin not administered due to documented severe allergy to NSAIDs Cardiology recommending transfer to [facility] for likely right and left heart catheterization on 4/4 Echocardiogram revealing severe hypokinesis of the left ventricular mid apical segment anterior wall lateral and inferior segment concerning for either large LAD territory ischemia or Takotsubo's cardiomyopathy. Acute metabolic encephalopathy Substantial confusion, likely secondary to waxing and waning hypoxia due to respiratory failure Treating underlying condition, monitoring for improvement Acute on chronic combined systolic and diastolic CHF (congestive heart  failure) (HCC) Echocardiogram performed today revealing a newly depressed ejection fraction of 35 to 40% with left ventricular demonstrating regional wall motion abnormalities  and severe hypokinesis of the left ventricular mid apical segment anterior wall lateral wall and inferior segment concerning for either large LAD territory ischemia or Takotsubo's cardiomyopathy. Patient responded well to dose of 40 mg of Lasix on 4/2 however considering patient's tenuous blood pressures holding off on additional doses of Lasix for now and instead focusing on rate control COVID-19 virus infection See assessment and plan above Essential hypertension Home regimen of Coreg has been discontinued, patient now on metoprolol twice daily per cardiology recommendations Chronic back pain Continue as needed Dilaudid, will abstain from administering if patient is confused. Depression with anxiety Continue home regimen of Zoloft 25 mg 3 times daily   Subjective:  Patient went to complain of shortness of breath and ongoing midsternal chest discomfort.  Patient is now stating that he "always" has chest discomfort.  Patient is unable to describe his chest discomfort further.    Physical Exam:  Vitals:   08/31/23 1600 08/31/23 1700 08/31/23 1800 08/31/23 1849  BP: 116/63 (!) 120/92 117/86 101/85  Pulse: (!) 119  (!) 116   Resp: (!) 24 (!) 27 (!) 27 (!) 25  Temp: 97.7 F (36.5 C)     TempSrc: Oral     SpO2: 98%  96%   Weight:      Height:        Constitutional: Patient is awake and alert.  Patient is oriented x 2 and visibly anxious and in mild respiratory distress.   Skin: no rashes, no lesions, poor skin turgor noted. Eyes: Pupils are equally reactive to light.  No evidence of scleral icterus or conjunctival pallor.  ENMT: Dry mucous membranes noted.  Posterior pharynx clear of any exudate or lesions.   Respiratory: Particularly throughout the left lung field but also in the right base.  intermittent  wheezing noted although this is improved compared to yesterday.  Normal respiratory effort. No accessory muscle use.  Cardiovascular: Tachycardic rate with irregularly irregular rhythm.  No murmurs / rubs / gallops.  Bilateral lower extremity pitting edema from the feet up to the knees.  2+ pedal pulses. No carotid bruits.  Abdomen: Abdomen is soft and nontender.  No evidence of intra-abdominal masses.  Positive bowel sounds noted in all quadrants.   Musculoskeletal: Significant pain with both passive and active range of motion of the bilateral hips.  No significant deformities noted.   Data Reviewed:  I have personally reviewed and interpreted labs, imaging.  Significant findings are   CBC: Recent Labs  Lab 08/28/23 1623 08/29/23 0542 08/30/23 0537 08/31/23 0530  WBC 7.5 8.6 14.9* 17.3*  NEUTROABS 6.4  --   --  14.4*  HGB 13.2 11.9* 14.2 14.3  HCT 41.5 37.6* 43.4 44.7  MCV 96.1 97.2 94.8 96.1  PLT 190 160 200 199   Basic Metabolic Panel: Recent Labs  Lab 08/28/23 1623 08/29/23 0542 08/30/23 0537 08/30/23 1738 08/31/23 0530  NA 132* 133* 134* 131* 131*  K 4.2 4.0 3.5 3.9 3.8  CL 97* 101 99 96* 96*  CO2 24 23 21* 21* 23  GLUCOSE 125* 92 143* 141* 118*  BUN 20 15 15 21  29*  CREATININE 1.10 0.96 1.03 1.19 1.36*  CALCIUM 9.5 8.9 9.4 9.1 8.6*  MG  --   --  1.8 1.8 2.0  PHOS  --   --  4.0 4.4  --    GFR: Estimated Creatinine Clearance: 49.9 mL/min (A) (by C-G formula based on SCr of 1.36 mg/dL (H)). Liver Function Tests: Recent Labs  Lab 08/28/23 1623 08/30/23 0537 08/30/23 1738 08/31/23 0530  AST 21 138*  --  136*  ALT 13 35  --  43  ALKPHOS 60 49  --  41  BILITOT 0.6 0.9  --  0.6  PROT 7.3 7.4  --  6.2*  ALBUMIN 4.3 3.9 4.1 3.4*    Coagulation Profile: Recent Labs  Lab 08/28/23 1623  INR 1.0     EKG: Personally reviewed.  Rapid atrial fibrillation at 149 beats a minute with T wave inversions noted in the lateral leads concerning for ischemia.     Code Status:  DNR.  Code status decision has been confirmed with: wife via phone conversation Family Communication: Plan of care discussed with wife via phone conversation.  CRITICAL CARE ATTESTATION:  Patient is significant risk of morbidity and mortality due to progressively worsening acute hypoxic respiratory failure in the setting of NSTEMI and acute congestive heart failure requiring initiation and titration of anticoagulants, rate controlling agents, obtaining and interpretation of ABGs, titration of supplemental oxygen, communicating with and coordinating with specialist such as cardiology as well as communicating with and educating family on clinical course.  Critical care time spent 70 minutes.  Author:  Marinda Elk MD  08/31/2023 7:13 PM

## 2023-08-31 NOTE — Progress Notes (Addendum)
 PHARMACY - ANTICOAGULATION CONSULT NOTE  Pharmacy Consult for heparin Indication: chest pain/ACS and atrial fibrillation  Allergies  Allergen Reactions   Nsaids Shortness Of Breath    Other reaction(s): Bronchospasm (ALLERGY/intolerance)   Oxycodone Other (See Comments)    Agitation. Per the any pain medication that has codeine in it causes that reaction. Other reaction(s): Other Agitation. Per the any pain medication that has codeine in it causes that reaction.   Tolmetin Shortness Of Breath   Tramadol Anaphylaxis   Fentanyl Other (See Comments)   Codeine Anxiety    Other reaction(s): Bronchospasm (ALLERGY/intolerance), Other (See Comments) Irritable     Patient Measurements: Height: 6' (182.9 cm) Weight: 84.6 kg (186 lb 9.6 oz) IBW/kg (Calculated) : 77.6 HEPARIN DW (KG): 84.6  Vital Signs: Temp: 97.4 F (36.3 C) (04/03 0734) Temp Source: Oral (04/03 0734) BP: 106/90 (04/03 0758) Pulse Rate: 101 (04/03 0758)  Labs: Recent Labs    08/28/23 1623 08/29/23 0542 08/30/23 0537 08/30/23 1738 08/31/23 0530 08/31/23 0705  HGB 13.2 11.9* 14.2  --  14.3  --   HCT 41.5 37.6* 43.4  --  44.7  --   PLT 190 160 200  --  199  --   APTT 28  --   --   --   --   --   LABPROT 13.8  --   --   --   --   --   INR 1.0  --   --   --   --   --   CREATININE 1.10 0.96 1.03 1.19 1.36*  --   TROPONINIHS  --   --   --   --   --  5,919*    Estimated Creatinine Clearance: 49.9 mL/min (A) (by C-G formula based on SCr of 1.36 mg/dL (H)).   Medical History: Past Medical History:  Diagnosis Date   Arthritis    Back pain, chronic    intolerant to narcotics, avoids NSAIDs due to vioxx related bleed,  sees pain clinic as [place], prior eval by [facility] neuro in [place]   Cholecystitis    Choledocholithiasis    Chronic diastolic CHF (congestive heart failure) (HCC) 05/16/2018   Per Dr Harvie Bridge note in 2019, pt did not have heart failure but hypertensive heart disease.  Joycelyn Man FNP-C    Encephalopathy acute 05/15/2011   Heart murmur    Hypertension    Inguinal hernia    Polyneuropathy    S/P thyroidectomy 1967   Scoliosis    Urinary retention     Medications:  Medications Prior to Admission  Medication Sig Dispense Refill Last Dose/Taking   acetaminophen (TYLENOL) 500 MG tablet Take 1,000 mg by mouth every 8 (eight) hours as needed for mild pain (pain score 1-3) or moderate pain (pain score 4-6). For pain.   Past Month   Artificial Tear Ointment (DRY EYES OP) Apply 1 drop to eye daily as needed (dry eyes).   Past Month   carvedilol (COREG) 12.5 MG tablet Take 1 tablet by mouth 2 (two) times daily.   Past Month   gabapentin (NEURONTIN) 600 MG tablet Take 600 mg by mouth 2 (two) times daily.   Past Month   HYDROmorphone (DILAUDID) 4 MG tablet Take 1 tablet by mouth every 8 (eight) hours.   Past Month   omeprazole (PRILOSEC) 40 MG capsule Take 40 mg by mouth daily.   Past Month   senna-docusate (SENOKOT-S) 8.6-50 MG tablet Take 2 tablets by mouth 2 (two) times daily  as needed for moderate constipation. 30 tablet 0 Past Month   sertraline (ZOLOFT) 25 MG tablet Take 75 mg by mouth daily.   Past Month   Scheduled:   dexamethasone (DECADRON) injection  6 mg Intravenous Daily   gabapentin  600 mg Oral BID   metoprolol tartrate  5 mg Intravenous Q6H   nirmatrelvir/ritonavir  3 tablet Oral BID   pantoprazole  40 mg Oral Daily   sertraline  25 mg Oral TID   sodium chloride flush  3 mL Intravenous Q12H    Assessment: 78 YO male presenting with COVID-19. On 4/13 AM, patient developed Afib with RVR, T wave abnormalities and began complaining of chest pain. Patient not on [facility] PTA but did receive a dose of Lovenox 40mg  on 4/2 @0958 . Pharmacy consulted for heparin dosing.  Today, 08/31/23: Hgb 14.3, plts 199--stable Trops elevated at 5919 Scr 1.36 (BL ~1) No s/sx of bleeding reported  Goal of Therapy:  Heparin level 0.3-0.7 units/ml Monitor platelets by anticoagulation  protocol: Yes   Plan:  Give 4000 units bolus x 1 Start heparin infusion at 1150 units/hr Check anti-Xa level in 8 hours and daily while on heparin Continue to monitor H&H and platelets   Cherylin Mylar, PharmD Clinical Pharmacist  4/3/20258:28 AM

## 2023-08-31 NOTE — Progress Notes (Signed)
   08/31/23 0640  Assess: MEWS Score  BP 109/72  Pulse Rate (!) 144  Assess: MEWS Score  MEWS Temp 0  MEWS Systolic 0  MEWS Pulse 3  MEWS RR 0  MEWS LOC 0  MEWS Score 3  MEWS Score Color Yellow  Assess: if the MEWS score is Yellow or Red  Were vital signs accurate and taken at a resting state? Yes  Does the patient meet 2 or more of the SIRS criteria? Yes  Does the patient have a confirmed or suspected source of infection? Yes  MEWS guidelines implemented  Yes, yellow  Treat  MEWS Interventions Considered administering scheduled or prn medications/treatments as ordered  Take Vital Signs  Increase Vital Sign Frequency  Yellow: Q2hr x1, continue Q4hrs until patient remains green for 12hrs  Escalate  MEWS: Escalate Yellow: Discuss with charge nurse and consider notifying provider and/or RRT  Notify: Charge Nurse/RN  Name of Charge Nurse/RN Notified Vera RN  Provider Notification  Provider Name/Title Liana Crocker  Date Provider Notified 08/31/23  Time Provider Notified [phone]  Method of Notification Page  Notification Reason Other (Comment);Critical Result  Provider response See new orders  Date of Provider Response 08/31/23  Time of Provider Response 0541  Notify: Rapid Response  Name of Rapid Response RN Notified Timber RN  Date Rapid Response Notified 08/31/23  Time Rapid Response Notified 0615  Assess: SIRS CRITERIA  SIRS Temperature  0  SIRS Respirations  0  SIRS Pulse 1  SIRS WBC 1  SIRS Score Sum  2   EKG showed afib, stemi nstemi notified rapid, pt c/o new 5/10 chest pain admin morphine. Rapid en route. 5 mg metoprolol given and 25 mcg fentanyl. See [facility]

## 2023-08-31 NOTE — Progress Notes (Signed)
    Patient Name: RONY RATZ           DOB: 08-17-1945  MRN: [id number]      Admission Date: 08/28/2023  Attending Provider: Marinda Elk, MD  Primary Diagnosis: Acute respiratory failure with hypoxia [facility])   Level of care: Telemetry    CROSS COVER NOTE   Date of Service   08/31/2023   Harl Favor, 78 y.o. male, was admitted on 08/28/2023 for Acute respiratory failure with hypoxia (HCC).    HPI/Events of Note   Notified by bedside RN of change in cardiac rhythm and rate.  Patient now tachycardic, HR 140s.  EKG- A-fib RVR, T wave abnormalities.  Patient was initially complaining of palpitations and shortness of breath.  However, he started developing midsternal chest pain, 7/10.  Patient states pain worsens with deep inspiration, cough, and chest wall palpation.  Troponin, BNP ordered. 5 mg IV Lopressor given for rate control. HR now ~ 90-110 bpm. BP stable, SBP 90-110's. Continue tele.   Chest pain improving after morphine and fentanyl. RN to continue monitoring.   Bedside Assessment:  Patient is awake, A/O x3. Does not appear to be in distress.  Denies lightheadedness, nausea, vomit,abdominal pain.    Respiratory: +rales, wheezing. Normal effort. No accessory muscle use.  Cardiovascular: Irregular rate and rhythm. No extremity edema. 2+ pedal pulses. Abdomen: Abdomen is soft and nontender.  Positive bowel sounds in all quadrants.     Interventions/ Plan   Above        Anthoney Harada, DNP, [facility] [place]

## 2023-08-31 NOTE — Progress Notes (Signed)
 PHARMACY - ANTICOAGULATION CONSULT NOTE  Pharmacy Consult for heparin Indication: chest pain/ACS and atrial fibrillation  Allergies  Allergen Reactions   Nsaids Shortness Of Breath    Other reaction(s): Bronchospasm (ALLERGY/intolerance)   Oxycodone Other (See Comments)    Agitation. Per the any pain medication that has codeine in it causes that reaction. Other reaction(s): Other Agitation. Per the any pain medication that has codeine in it causes that reaction.   Tolmetin Shortness Of Breath   Tramadol Anaphylaxis   Fentanyl Other (See Comments)   Codeine Anxiety    Other reaction(s): Bronchospasm (ALLERGY/intolerance), Other (See Comments) Irritable     Patient Measurements: Height: 6' (182.9 cm) Weight: 84.6 kg (186 lb 9.6 oz) IBW/kg (Calculated) : 77.6 HEPARIN DW (KG): 84.6  Vital Signs: Temp: 97.7 F (36.5 C) (04/03 1600) Temp Source: Oral (04/03 1600) BP: 101/85 (04/03 1849) Pulse Rate: 116 (04/03 1800)  Labs: Recent Labs    08/29/23 0542 08/30/23 0537 08/30/23 1738 08/31/23 0530 08/31/23 0705 08/31/23 1056 08/31/23 1301 08/31/23 1802  HGB 11.9* 14.2  --  14.3  --   --   --   --   HCT 37.6* 43.4  --  44.7  --   --   --   --   PLT 160 200  --  199  --   --   --   --   HEPARINUNFRC  --   --   --   --   --   --   --  0.96*  CREATININE 0.96 1.03 1.19 1.36*  --   --   --   --   TROPONINIHS  --   --   --   --  5,919* 5,340* 4,534*  --     Estimated Creatinine Clearance: 49.9 mL/min (A) (by C-G formula based on SCr of 1.36 mg/dL (H)).   Medical History: Past Medical History:  Diagnosis Date   Arthritis    Back pain, chronic    intolerant to narcotics, avoids NSAIDs due to vioxx related bleed,  sees pain clinic as [place], prior eval by [facility] neuro in [place]   Cholecystitis    Choledocholithiasis    Chronic diastolic CHF (congestive heart failure) (HCC) 05/16/2018   Per Dr Harvie Bridge note in 2019, pt did not have heart failure but hypertensive heart  disease.  Joycelyn Man FNP-C   Encephalopathy acute 05/15/2011   Heart murmur    Hypertension    Inguinal hernia    Polyneuropathy    S/P thyroidectomy 1967   Scoliosis    Urinary retention     Medications:  Medications Prior to Admission  Medication Sig Dispense Refill Last Dose/Taking   acetaminophen (TYLENOL) 500 MG tablet Take 1,000 mg by mouth every 8 (eight) hours as needed for mild pain (pain score 1-3) or moderate pain (pain score 4-6). For pain.   Past Month   Artificial Tear Ointment (DRY EYES OP) Apply 1 drop to eye daily as needed (dry eyes).   Past Month   carvedilol (COREG) 12.5 MG tablet Take 1 tablet by mouth 2 (two) times daily.   Past Month   gabapentin (NEURONTIN) 600 MG tablet Take 600 mg by mouth 2 (two) times daily.   Past Month   HYDROmorphone (DILAUDID) 4 MG tablet Take 1 tablet by mouth every 8 (eight) hours.   Past Month   omeprazole (PRILOSEC) 40 MG capsule Take 40 mg by mouth daily.   Past Month   senna-docusate (SENOKOT-S) 8.6-50 MG tablet Take  2 tablets by mouth 2 (two) times daily as needed for moderate constipation. 30 tablet 0 Past Month   sertraline (ZOLOFT) 25 MG tablet Take 75 mg by mouth daily.   Past Month   Scheduled:   Chlorhexidine Gluconate Cloth  6 each Topical Daily   dexamethasone (DECADRON) injection  6 mg Intravenous Daily   nirmatrelvir/ritonavir  3 tablet Oral BID   pantoprazole  40 mg Oral Daily   sertraline  25 mg Oral TID   sodium chloride flush  3 mL Intravenous Q12H    Assessment: 78 YO male presenting with COVID-19. On 4/13 AM, patient developed Afib with RVR, T wave abnormalities and began complaining of chest pain. Patient not on [facility] PTA but did receive a dose of Lovenox 40mg  on 4/2 @0958 . Pharmacy consulted for heparin dosing.  Today, 08/31/23: -First heparin level 0.96 - supratherapeutic with heparin infusion at 1150 units/hr -Trops elevated at 5919 -No complications of therapy noted  Goal of Therapy:  Heparin level  0.3-0.7 units/ml Monitor platelets by anticoagulation protocol: Yes   Plan:  -Decrease heparin infusion to 1000 units/hr -Check heparin level 8 hours after rate change -Daily heparin level, CBC -Plan for [facility] 4/4   Pricilla Riffle, PharmD, BCPS Clinical Pharmacist 08/31/2023 6:58 PM

## 2023-08-31 NOTE — Assessment & Plan Note (Signed)
 Patient began to complain of chest discomfort early in the morning on 4/3 in the midst of rapid atrial fibrillation.   Extremely elevated troponin at 7 AM this morning of 5919, heparin was initiated shortly thereafter Concurrent rapid atrial fibrillation was felt to be exacerbating the degree of the patient's ischemia and therefore amiodarone was initiated And troponins were 5340 and 4534 Cardiology following, their input is appreciated Aspirin not administered due to documented severe allergy to NSAIDs Cardiology recommending transfer to [facility] for likely right and left heart catheterization on 4/4 Echocardiogram revealing severe hypokinesis of the left ventricular mid apical segment anterior wall lateral and inferior segment concerning for either large LAD territory ischemia or Takotsubo's cardiomyopathy.

## 2023-08-31 NOTE — Consult Note (Addendum)
 Cardiology Consultation   Patient ID: Brian Ruiz MRN: [id number]; DOB: [birth year]  Admit date: 08/28/2023 Date of Consult: 08/31/2023  PCP:  Elmon Else   [facility] Providers Cardiologist:  New - Dr. Jacques Navy (previously seen by Dr. Elease Hashimoto in 2019) Click here to update MD or APP on Care Team, Refresh:1}     Patient Profile:   Brian Ruiz is a 78 y.o. male with a history of palpitations, hypertension, remote thyroidectomy in 1967, and chronic back pain who is being seen 08/31/2023 for the evaluation of elevation troponin and new onset atrial fibrillation at the request of Dr. Leafy Half.  History of Present Illness:   Brian Ruiz is a 78 year old male with the above history who was previously seen by Dr. Elease Hashimoto in 2019. He was referred to Dr. Elease Hashimoto in 07/2017 for further evaluation of palpitations. He had reportedly worn a Holter Monitor in the past which showed some SVT. However, I am unable to personally see the results of this. He also reported a remote cardiac catheterization in 1996 which was normal. At that visit, he was noted to have ventricular bigeminy on EKG. Repeat monitor and Echo were ordered for further evaluation. Echo showed LVEF of 55-60% with grade 2 diastolic dysfunction and mild MR/ mild AI.  Monitor showed frequent PVCs (15% burden) and PACs (10% burden). Myoview was then ordered and showed a fixed apical inferior perfusion defect, which was felt to be due to diaphragmatic attenuation, but no evidence of ischemia. His beta-blocker was increased. He has not been seen by Cardiology since 04/2018.  Patient presented to the [facility] ED on 08/28/2023 for further evaluation of generalized weakness. Upon arrival to the ED, he was noted to be hypoxic on arrival with O2 sats in the mid 80s. EKG showed normal sinus rhythm with no acute ischemic changes. WBC 7.5, Hgb 13.2, Plts 190. Na 132, K 4.2, Glucose 125, BUN 20, Cr 1.10. LFTs normal. Chest x-ray showed  stable markedly elevated right hemidiaphragm with adjacent right basilar atelectasis and interval small amount of left basilar atelectasis but no evidence of pneumonia. Respiratory panel was positive for COVID. He was started on Paxlovid and steroids as well as antibiotics. BNP came back elevated at 2,770 on 4/1 so he was also given a trial of IV Lasix due to concern that he may have a component of diastolic CHF as well. Rapid response was called on the morning of 4/3 due to tachycardia with rates in the 140s and patient complaining of mild chest pain. EKG showed atrial fibrillation. He was given a dose of IV Lopressor as well as Fentanyl. Heart rate improved with this and patient subsequently had improvement of his chest pain as well. High-sensitivity troponin was ordered and came back markedly elevated at 5,919. Cardiology consulted for further evaluation.  At the time of this evaluation, patient is resting comfortably no acute distress.  He remains in atrial fibrillation with rates in the 120s to 140s.  He is a very difficult historian and admits that his memory has had a significant decline over the last couple of years.  He states his short-term memory is very bad.  He has not undergone any formal evaluation for this.  He states he came to the ED due to significant weakness and multiple falls that came on rather suddenly over the last couple of days prior to admission.  He cannot remember much about these falls and does not know if he hit his head.  It does not sound like he had any loss of consciousness but patient states he really cannot say for sure.  He has a history of palpitations but states this really has not bothered him in the last couple of years.  He does report some heart fluttering while in atrial fibrillation but denies any of this prior to this morning.  He denies any lightheadedness/dizziness near syncope prior to current illness.  He does report chest pain that he describes as a pressure  states it feels like a "fist on his chest."  He states he has had this pain for years though.  He does think it is worse with exertion and he also feels like it has been getting worse in intensity and frequency over the last few months.  He states he is having this pain basically all the time now.  This pain was a little bit worse this morning when he first went into rapid atrial fibrillation.  However, it is now at his baseline.  He also reports some dyspnea on exertion over the last few years but states he does feel like this has been getting worse as well.  No significant shortness of breath at rest.  No orthopnea, PND, edema.  Per triage note, he had a fever at home on day of admission but he denies this with me.  He lives with his wife who helps him with his medications.  He typically ambulates with a walker.  He denies having any falls prior to current illness.  I called and spoke with wife after seeing patient. Wife also currently has COVID so is unable to be with him here in the hospital. Wife reports he fell twice on 08/28/2023. The first fall occurred after he came back from using the bathroom. He fell trying to get on the bed. Wife had to call EMS to help get him off the floor. Patient declined going to the ED at that time. Both patient and wife took a nap. After waking up, patient was walking to go to the bathroom but did not make it there in time. He started urinating while walking and then slipped on the wet floor. Wife is not sure if he hit his head during either fall. She does not think he had any loss of consciousness. Wife has not heard him complain of chest pain or significant shortness of breath lately.  Patient has documented allergy to NSAIDS. I asked wife if there was a reason patient could not take Aspirn and she stated he had GI bleeding on Vioxx in the past. He was reportedly found to have 2 bleeding ulcers at that time. This was several years ago. She also reports he has had shortness  of breath with taking Aspirin in the past.   Past Medical History:  Diagnosis Date   Arthritis    Back pain, chronic    intolerant to narcotics, avoids NSAIDs due to vioxx related bleed,  sees pain clinic as [facility], prior eval by [facility] neuro in [place]   Cholecystitis    Choledocholithiasis    Chronic diastolic CHF (congestive heart failure) (HCC) 05/16/2018   Per Dr Harvie Bridge note in 2019, pt did not have heart failure but hypertensive heart disease.  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   Scoliosis    Urinary retention     Past Surgical History:  Procedure Laterality Date   BACK SURGERY  1975   [id number]   CHOLECYSTECTOMY N/A 11/13/2020   Procedure: LAPAROSCOPIC CHOLECYSTECTOMY WITH INTRAOPERATIVE CHOLANGIOGRAM;  Surgeon: Violeta Gelinas, MD;  Location: [facility] OR;  Service: General;  Laterality: N/A;   COLONOSCOPY     FOOT SURGERY  1985, 2000   due to arthritis   HAND SURGERY  2005   left - due to arthritis   INGUINAL HERNIA REPAIR  04/30/2012   Procedure: HERNIA REPAIR INGUINAL ADULT;  Surgeon: Currie Paris, MD;  Location: [facility];  Service: General;  Laterality: Left;  repair left inguinal hernia   THYROIDECTOMY  1967   TONSILLECTOMY       Home Medications:  Prior to Admission medications   Medication Sig Start Date End Date Taking? Authorizing Provider  acetaminophen (TYLENOL) 500 MG tablet Take 1,000 mg by mouth every 8 (eight) hours as needed for mild pain (pain score 1-3) or moderate pain (pain score 4-6). For pain.   Yes [provider]  Artificial Tear Ointment (DRY EYES OP) Apply 1 drop to eye daily as needed (dry eyes).   Yes [provider]  carvedilol (COREG) 12.5 MG tablet Take 1 tablet by mouth 2 (two) times daily.   Yes [provider]  gabapentin (NEURONTIN) 600 MG tablet Take 600 mg by mouth 2 (two) times  daily. 07/15/23  Yes [provider]  HYDROmorphone (DILAUDID) 4 MG tablet Take 1 tablet by mouth every 8 (eight) hours. 08/28/23  Yes [provider]  omeprazole (PRILOSEC) 40 MG capsule Take 40 mg by mouth daily. 09/07/20  Yes [provider]  senna-docusate (SENOKOT-S) 8.6-50 MG tablet Take 2 tablets by mouth 2 (two) times daily as needed for moderate constipation. 11/15/20  Yes Azucena Fallen, MD  sertraline (ZOLOFT) 25 MG tablet Take 75 mg by mouth daily.   Yes [provider]    Inpatient Medications: Scheduled Meds:  Chlorhexidine Gluconate Cloth  6 each Topical Daily   dexamethasone (DECADRON) injection  6 mg Intravenous Daily   metoprolol tartrate  5 mg Intravenous Q6H   nirmatrelvir/ritonavir  3 tablet Oral BID   pantoprazole  40 mg Oral Daily   sertraline  25 mg Oral TID   sodium chloride flush  3 mL Intravenous Q12H   Continuous Infusions:  amiodarone 60 mg/hr (08/31/23 1022)   Followed by   amiodarone     heparin 1,150 Units/hr (08/31/23 0926)   PRN Meds: acetaminophen **OR** acetaminophen, gabapentin, guaiFENesin, HYDROmorphone, ipratropium-albuterol, menthol-cetylpyridinium, morphine injection, nitroGLYCERIN, ondansetron **OR** ondansetron (ZOFRAN) IV, mouth rinse, perflutren lipid microspheres (DEFINITY) IV suspension, senna-docusate  Allergies:    Allergies  Allergen Reactions   Nsaids Shortness Of Breath    Other reaction(s): Bronchospasm (ALLERGY/intolerance)   Oxycodone Other (See Comments)    Agitation. Per the any pain medication that has codeine in it causes that reaction. Other reaction(s): Other Agitation. Per the any pain medication that has codeine in it causes that reaction.   Tolmetin Shortness Of Breath   Tramadol Anaphylaxis   Fentanyl Other (See Comments)   Codeine Anxiety    Other reaction(s): Bronchospasm (ALLERGY/intolerance), Other (See Comments) Irritable     Social History:   Social History    Socioeconomic History   Marital status: Married    Spouse name: Not on file   Number of children: Not on file   Years of education: Not on file   Highest education level: Not on file  Occupational History  Not on file  Tobacco Use   Smoking status: Never    Passive exposure: Never   Smokeless tobacco: Never  Vaping Use   Vaping status: Never Used  Substance and Sexual Activity   Alcohol use: No   Drug use: No   Sexual activity: Yes    Partners: Female  Other Topics Concern   Not on file  Social History Narrative   Quit ETOH > 20years ago   Disabled due to back pain   Non-smoker   Lives at home   Wife Conservation officer, nature   Son +   Social Drivers of Health   Financial Resource Strain: Low Risk  (02/08/2021)   Received from [facility], [facility]   Overall Financial Resource Strain (CARDIA)    Difficulty of Paying Living Expenses: Not hard at all  Food Insecurity: No Food Insecurity (08/28/2023)   Hunger Vital Sign    Worried About Running Out of Food in the Last Year: Never true    Ran Out of Food in the Last Year: Never true  Transportation Needs: No Transportation Needs (08/28/2023)   PRAPARE - Administrator, Civil Service (Medical): No    Lack of Transportation (Non-Medical): No  Physical Activity: Inactive (02/08/2021)   Received from [facility], [facility]   Exercise Vital Sign    Days of Exercise per Week: 0 days    Minutes of Exercise per Session: 0 min  Stress: No Stress Concern Present (02/08/2021)   Received from [facility], [facility] - Occupational Stress Questionnaire    Feeling of Stress : Not at all  Social Connections: Socially Integrated (08/28/2023)   Social Connection and Isolation Panel [NHANES]    Frequency of Communication with Friends and Family: Three times a week    Frequency of Social Gatherings with Friends and Family: Three times a week    Attends Religious Services: More than  4 times per year    Active Member of Clubs or Organizations: Yes    Attends Banker Meetings: 1 to 4 times per year    Marital Status: Married  Catering manager Violence: Not At Risk (08/28/2023)   Humiliation, Afraid, Rape, and Kick questionnaire    Fear of Current or Ex-Partner: No    Emotionally Abused: No    Physically Abused: No    Sexually Abused: No    Family History:   Family History  Problem Relation Age of Onset   Heart disease Father      ROS:  Please see the history of present illness.  Difficult to get detail ROS due to patient's memory issues.    Physical Exam/Data:   Vitals:   08/31/23 0736 08/31/23 0758 08/31/23 0917 08/31/23 1000  BP:  (!) 106/90 109/81 101/73  Pulse:  (!) 101  (!) 118  Resp: 20  (!) 25 (!) 23  Temp:      TempSrc:      SpO2:    96%  Weight:      Height:        Intake/Output Summary (Last 24 hours) at 08/31/2023 1144 Last data filed at 08/30/2023 2230 Gross per 24 hour  Intake 118 ml  Output 1150 ml  Net -1032 ml      08/28/2023    3:45 PM 11/15/2020    4:10 AM 11/14/2020    4:18 AM  Last 3 Weights  Weight (lbs) 186 lb 9.6 oz 203 lb 11.3 oz 202  lb 13.2 oz  Weight (kg) 84.641 kg 92.4 kg 92 kg     Body mass index is 25.31 kg/m.  General: 78 y.o. thin Caucasian male resting comfortably in no acute distress. HEENT: Normocephalic and atraumatic. Sclera clear. Neck: Supple. No carotid bruits. No JVD. Heart: RRR. Distinct S1 and S2. No murmurs, gallops, or rubs. Radial and distal pedal pulses 2+ and equal bilaterally. Lungs: No increased work of breathing. Clear to ausculation bilaterally. No wheezes, rhonchi, or rales.  Abdomen: Soft, non-distended, and non-tender to palpation. Bowel sounds present in all 4 quadrants.  MSK: Normal strength and tone for age. Extremities: No clubbing, cyanosis, or edema.    Skin: Cool on exam. Neuro: Alert and oriented x3. No focal deficits. Psych: Normal affect. Responds  appropriately.   EKG:  The EKG was personally reviewed and demonstrates:  Initial EKG on admission showed normal sinus rhythm with no acute ischemic changes. Repeat EKG this morning showed atrial fibrillation, rate 139 bpm, with non-specific T wave changes. Telemetry:  Telemetry was personally reviewed and demonstrates:  Atrial fibrillation with rates in the 120s to 140s. Occasional PVCs and a couple of very short runs of NSVT (longest run 3 beats) noted.   Relevant CV Studies:  Echocardiogram 08/14/2017: Study Conclusion: - Left ventricle: The cavity size was normal. Wall thickness was    normal. Systolic function was normal. The estimated ejection    fraction was in the range of 55% to 60%. Features are consistent    with a pseudonormal left ventricular filling pattern, with    concomitant abnormal relaxation and increased filling pressure    (grade 2 diastolic dysfunction).  - Aortic valve: There was mild regurgitation.  - Mitral valve: There was mild regurgitation.  _______________   Myoview 01/11/2018: The left ventricular ejection fraction is moderately decreased (30-44%). Nuclear stress EF: 42%. There was no ST segment deviation noted during stress. This is an intermediate risk study.   1. EF 42%, diffuse hypokinesis.  2. Fixed small, mild mid to apical inferior perfusion defect.  There was not a regional inferior wall motion abnormality, so suspect more likely diaphragmatic attenuation than infarction.  No evidence for ischemia.    Intermediate risk study due to low EF.  Consider echo to confirm.    Laboratory Data:  High Sensitivity Troponin:   Recent Labs  Lab 08/31/23 0705  TROPONINIHS 5,919*     Chemistry Recent Labs  Lab 08/30/23 0537 08/30/23 1738 08/31/23 0530  NA 134* 131* 131*  K 3.5 3.9 3.8  CL 99 96* 96*  CO2 21* 21* 23  GLUCOSE 143* 141* 118*  BUN 15 21 29*  CREATININE 1.03 1.19 1.36*  CALCIUM 9.4 9.1 8.6*  MG 1.8 1.8 2.0  GFRNONAA >60 >60 54*   ANIONGAP 14 14 12     Recent Labs  Lab 08/28/23 1623 08/30/23 0537 08/30/23 1738 08/31/23 0530  PROT 7.3 7.4  --  6.2*  ALBUMIN 4.3 3.9 4.1 3.4*  AST 21 138*  --  136*  ALT 13 35  --  43  ALKPHOS 60 49  --  41  BILITOT 0.6 0.9  --  0.6   Lipids No results for input(s): "CHOL", "TRIG", "HDL", "LABVLDL", "LDLCALC", "CHOLHDL" in the last 168 hours.  Hematology Recent Labs  Lab 08/29/23 0542 08/30/23 0537 08/31/23 0530  WBC 8.6 14.9* 17.3*  RBC 3.87* 4.58 4.65  HGB 11.9* 14.2 14.3  HCT 37.6* 43.4 44.7  MCV 97.2 94.8 96.1  MCH 30.7 31.0 30.8  MCHC 31.6 32.7 32.0  RDW 13.1 13.0 13.0  PLT 160 200 199   Thyroid No results for input(s): "TSH", "FREET4" in the last 168 hours.  BNP Recent Labs  Lab 08/30/23 1738 08/31/23 0705  BNP 2,770.5* 1,951.7*    DDimer No results for input(s): "DDIMER" in the last 168 hours.   Radiology/Studies:  DG Chest 1 View Result Date: 08/30/2023 CLINICAL DATA:  Pneumonia and fever EXAM: CHEST  1 VIEW COMPARISON:  Chest x-ray 08/28/2023 FINDINGS: There stable moderate elevation of the right hemidiaphragm. Heart is enlarged. There central pulmonary vascular congestion and patchy perihilar opacities which have increased. No pneumothorax or acute fracture. IMPRESSION: Cardiomegaly with central pulmonary vascular congestion and patchy perihilar opacities which have increased. Findings may be due to pulmonary edema or infection. Follow-up PA and lateral chest x-ray recommended in 4-6 weeks to confirm resolution. Electronically Signed   By: Darliss Cheney M.D.   On: 08/30/2023 16:55   DG Chest Port 1 View Result Date: 08/28/2023 CLINICAL DATA:  Possible sepsis. EXAM: PORTABLE CHEST 1 VIEW COMPARISON:  11/10/2020.  Abdomen and pelvis CT dated 11/10/2020. FINDINGS: Stable markedly elevated right hemidiaphragm with adjacent right basilar atelectasis. Interval small amount of left basilar atelectasis. No airspace consolidation suspicious for pneumonia.  Normal-sized heart. Tortuous and partially calcified thoracic aorta. Diffuse osteopenia. IMPRESSION: 1. Interval small amount of left basilar atelectasis. 2. Stable markedly elevated right hemidiaphragm with adjacent right basilar atelectasis. 3. No evidence of pneumonia. Electronically Signed   By: Beckie Salts M.D.   On: 08/28/2023 16:41     Assessment and Plan:   NSTEMI Patient was admitted with acute hypoxic respiratory failure secondary to COVID-19 infection. Earlier this morning, he started having some mild chest pain and was noted to be tachycardic. EKG showed atrial fibrillation, rate 139 bpm, with non-specific T wave changes. Initial high-sensitivity troponin 5,919. - Patient states he has had mild chest pressure/ tightness for years that has been getting worse in frequency and intensity over the last couple of months. He also reports worsening dyspnea on exertion over the last couple of years and especially over the last couple of months.  - Will continue to trend troponin. - Waiting on formal Echo read but EF looks down. - Agree with starting IV Heparin. - Will start Lopressor 25mg  twice daily. BP is soft at time so will place hold parameters.  - Patient has allergy to NSAIDs. Spoke with patient's wife who states patient has had GI bleeding on Vioxx in the past as well as shortness of breath with Aspirin in the past. Will hold off on Aspirin for now. - Will need a statin but will hold on this due to the fact that he is currently on Paxlovid for COVID. Can start this 3-5 days after last dose of Paxlovid per Pharmacy.  - Initial troponin is higher than I would expect for demand ischemia. Worsening chest pain and dyspnea over the last couple of months also more worrisome for true ACS. Will start IV Heparin and get Echo. Suspect he will need left cardiac catheterization at some point. - will discuss timing of this with MD.  New Onset Atrial Fibrillation Presented in sinus rhythm but went  into rapid atrial fibrillation earlier this morning in setting of COVID.  - Remains in atrial fibrillation at this time. Rates in the 120s to 140s.  - Potassium 3.8. Will replete.  - Magnesium 2.0.  - Will check TSH. - BP soft so have started IV Amiodarone.  -  Will start Lopressor 25mg  twice daily. BP is soft at time so will place hold parameters. - CHA2DS2-VASc score is at least 5 (suspected CAD, CHF, HTN, age x2) Continue IV Heparin. Will need DOAC prior to discharge after procedures have been completed.   Elevated BNP Possible Diastolic CHF BNP 1,610 >> 1,951 this admission. No overt edema on chest x-ray. EF was normal on Echo in 2019. He was given a trial of IV Lasix yesterday due to concern that he may have a component of CHF contributing to ongoing acute hypoxic respiratory failure. He had great urine output with this. Net negative 1.5 L so far. Creatinine and BUN up today. - He does not appear significantly volume overload on exam. - Waiting on formal Echo read but EF looks down. - Will hold off on additional IV Lasix given rise in creatinine and BUN. - Will start Lopressor 25mg  twice daily. Can transition to Toprol-XL prior to discharge. - Will hold off on additional GDTM right now given soft BP and need for additional rate control. However, will plan to add if able prior to discharge.  Hypertension History of hypertension but BP soft this admission.  - He is on Coreg 12.5mg  twice daily at home. This is currently on hold. Will start Lopressor 25mg  twice daily instead given NSTEMI and rapid atrial fibrillation but will place hold parameters for BP.  Memory Issues Patient report he has had significant memory issues over the last couple of years. He states his short term memory has declined rather rapidly. He has not been formally evaluated for this as an outpatient.  - Will get a head CT given memory issues and the fact that patient had 2 falls prior to admission and cannot tell me  whether or not he hit his head in the process.  Otherwise, per primary team: - Acute hypoxic respiratory failure secondary to COVID-19 - Chronic back pain  - Anxiety/ depression - Generalized weakness/ falls  Risk Assessment/Risk Scores:   TIMI Risk Score for Unstable Angina or Non-ST Elevation MI:   The patient's TIMI risk score is 3, which indicates a 13% risk of all cause mortality, new or recurrent myocardial infarction or need for urgent revascularization in the next 14 days.{   CHA2DS2-VASc Score = 5  This indicates a 7.2% annual risk of stroke. The patient's score is based upon: CHF History: 1 HTN History: 1 Diabetes History: 0 Stroke History: 0 Vascular Disease History: 1 Age Score: 2 Gender Score: 0      For questions or updates, please contact [facility] Please consult [email or web address] for contact info under    Signed, Corrin Parker, PA-C  08/31/2023 11:44 AM  ADDENDUM:  Patient is cool on exam and has not had much urine output today. We are a little concerned that he may be headed toward low output CHF. Lactic acid was 2.2 on admission but improved to 1.8 yesterday. Will repeat STAT lactic acid now. If that is rising, will like order PICC line and get CO-OX. Had initially ordered Lopressor to start tonight; however, given concern for low output, will stop this. May need [facility] tomorrow. Will make NPO at midnight just in case.   Corrin Parker, PA-C 08/31/2023 4:48 PM  Patient seen and examined with Brian Skiff PA-C.  Agree as above, with the following exceptions and changes as noted below.  78 year old male with a history of hypertension seen for the evaluation of elevated troponin and atrial fibrillation with acute systolic heart  failure this is all in the setting of COVID-19 infection, he is on Paxlovid and steroids as well as antibiotics.  Diuresed with Lasix given concern of diastolic heart failure however ejection fraction on echocardiogram  independently reviewed and noted to be 20 to 25% with apical regional wall motion abnormalities and mid septal regional wall motion abnormality.  Diuresed very well initially with Lasix, but remains in atrial fibrillation with rapid ventricular response, and today has made only 300 cc of urine without the assistance of Lasix with a rising creatinine.  While this may represent that he is dry, I am quite concerned about a low output state given that the patient's lower extremities are quite cold.  He is mildly hypotensive.  Gen: Appears fatigued but mentation is clear, CV: Irregular rhythm with no murmurs, Lungs: clear, Abd: soft, Extrem: Cold on exam with no edema, Neuro/Psych: alert and oriented x 2, normal mood and affect, some aspects of memory are challenging for him to recall but he is aware of where he is and notes that the year is 2026. All available labs, radiology testing, previous records reviewed.  It is unclear at this time if we have diuresed him adequately and that is why urine output is so minimal, however with an ejection fraction of 20%, and cold lower extremities in the setting of hypotension and continued A-fib RVR, I would like more hemodynamic data to evaluate for low output state.  We will plan for a PICC line to be placed in a Co. oximetry panel and repeat lactate to be performed as well as a CVP.  If these are within a normal range, we can hold diuresis and continue to observe.  If Co. oximetry panel demonstrates low Co. ox, with reduced urine output and and if lactate is elevated, would recommend the involvement of advanced heart failure service.  We have tentatively scheduled a right and left heart catheterization for tomorrow for better hemodynamic assessment, in the setting of NSTEMI concomitant with COVID with evidence of regional wall motion abnormalities on echo and troponin that has now peaked and is downtrending but is concerning for ischemia.  I have discussed this with the primary  service, they are in agreement with transfer to [facility] for more close monitoring overnight and potential cath in the morning.  CRITICAL CARE Performed by: Weston Brass, MD   Total critical care time: 35 minutes   Critical care time was exclusive of separately billable procedures and treating other patients.   Critical care was necessary to treat or prevent imminent or life-threatening deterioration.   Critical care was time spent personally by me (independent of APPs or residents) on the following activities: development of treatment plan with patient and/or surrogate as well as nursing, discussions with consultants, evaluation of patient's response to treatment, examination of patient, obtaining history from patient or surrogate, ordering and performing treatments and interventions, ordering and review of laboratory studies, ordering and review of radiographic studies, pulse oximetry and re-evaluation of patient's condition.   Parke Poisson, MD 08/31/23 5:21 PM

## 2023-09-01 ENCOUNTER — Inpatient Hospital Stay (HOSPITAL_COMMUNITY)

## 2023-09-01 ENCOUNTER — Inpatient Hospital Stay (HOSPITAL_COMMUNITY)
Admission: EM | Disposition: A | Discharge status: Skilled Nursing Facility | Payer: Self-pay | Source: Home / Self Care | Attending: Cardiology

## 2023-09-01 DIAGNOSIS — R7401 Elevation of levels of liver transaminase levels: Secondary | ICD-10-CM

## 2023-09-01 DIAGNOSIS — I4891 Unspecified atrial fibrillation: Secondary | ICD-10-CM

## 2023-09-01 DIAGNOSIS — I5021 Acute systolic (congestive) heart failure: Secondary | ICD-10-CM | POA: Diagnosis not present

## 2023-09-01 DIAGNOSIS — I214 Non-ST elevation (NSTEMI) myocardial infarction: Secondary | ICD-10-CM | POA: Diagnosis not present

## 2023-09-01 DIAGNOSIS — R570 Cardiogenic shock: Secondary | ICD-10-CM

## 2023-09-01 DIAGNOSIS — N179 Acute kidney failure, unspecified: Secondary | ICD-10-CM

## 2023-09-01 DIAGNOSIS — J9601 Acute respiratory failure with hypoxia: Secondary | ICD-10-CM | POA: Diagnosis not present

## 2023-09-01 LAB — POCT I-STAT EG7
Acid-base deficit: 3 mmol/L — ABNORMAL HIGH (ref 0.0–2.0)
Acid-base deficit: 3 mmol/L — ABNORMAL HIGH (ref 0.0–2.0)
Bicarbonate: 22.6 mmol/L (ref 20.0–28.0)
Bicarbonate: 23.1 mmol/L (ref 20.0–28.0)
Calcium, Ion: 1.11 mmol/L — ABNORMAL LOW (ref 1.15–1.40)
Calcium, Ion: 1.12 mmol/L — ABNORMAL LOW (ref 1.15–1.40)
HCT: 43 % (ref 39.0–52.0)
HCT: 43 % (ref 39.0–52.0)
Hemoglobin: 14.6 g/dL (ref 13.0–17.0)
Hemoglobin: 14.6 g/dL (ref 13.0–17.0)
O2 Saturation: 54 %
O2 Saturation: 54 %
Potassium: 4 mmol/L (ref 3.5–5.1)
Potassium: 4 mmol/L (ref 3.5–5.1)
Sodium: 134 mmol/L — ABNORMAL LOW (ref 135–145)
Sodium: 134 mmol/L — ABNORMAL LOW (ref 135–145)
TCO2: 24 mmol/L (ref 22–32)
TCO2: 24 mmol/L (ref 22–32)
pCO2, Ven: 41.4 mmHg — ABNORMAL LOW (ref 44–60)
pCO2, Ven: 42.8 mmHg — ABNORMAL LOW (ref 44–60)
pH, Ven: 7.341 (ref 7.25–7.43)
pH, Ven: 7.346 (ref 7.25–7.43)
pO2, Ven: 30 mmHg — CL (ref 32–45)
pO2, Ven: 30 mmHg — CL (ref 32–45)

## 2023-09-01 LAB — COOXEMETRY PANEL
Carboxyhemoglobin: 0.8 % (ref 0.5–1.5)
Carboxyhemoglobin: 0.8 % (ref 0.5–1.5)
Methemoglobin: 0.8 % (ref 0.0–1.5)
Methemoglobin: <0.7 % (ref 0.0–1.5)
O2 Saturation: 56.8 %
O2 Saturation: 52.2 %
Total hemoglobin: 14.9 g/dL (ref 12.0–16.0)
Total hemoglobin: 13.6 g/dL (ref 12.0–16.0)

## 2023-09-01 LAB — CBC WITH DIFFERENTIAL/PLATELET
Abs Immature Granulocytes: 0.14 10*3/uL — ABNORMAL HIGH (ref 0.00–0.07)
Basophils Absolute: 0 10*3/uL (ref 0.0–0.1)
Basophils Relative: 0 %
Eosinophils Absolute: 0 10*3/uL (ref 0.0–0.5)
Eosinophils Relative: 0 %
HCT: 42.4 % (ref 39.0–52.0)
Hemoglobin: 14.4 g/dL (ref 13.0–17.0)
Immature Granulocytes: 1 %
Lymphocytes Relative: 8 %
Lymphs Abs: 1.6 10*3/uL (ref 0.7–4.0)
MCH: 31 pg (ref 26.0–34.0)
MCHC: 34 g/dL (ref 30.0–36.0)
MCV: 91.2 fL (ref 80.0–100.0)
Monocytes Absolute: 1.6 10*3/uL — ABNORMAL HIGH (ref 0.1–1.0)
Monocytes Relative: 7 %
Neutro Abs: 18.2 10*3/uL — ABNORMAL HIGH (ref 1.7–7.7)
Neutrophils Relative %: 84 %
Platelets: 207 10*3/uL (ref 150–400)
RBC: 4.65 MIL/uL (ref 4.22–5.81)
RDW: 13 % (ref 11.5–15.5)
WBC: 21.6 10*3/uL — ABNORMAL HIGH (ref 4.0–10.5)
nRBC: 0 % (ref 0.0–0.2)

## 2023-09-01 LAB — CBC
HCT: 37.4 % — ABNORMAL LOW (ref 39.0–52.0)
Hemoglobin: 12.6 g/dL — ABNORMAL LOW (ref 13.0–17.0)
MCH: 31.5 pg (ref 26.0–34.0)
MCHC: 33.7 g/dL (ref 30.0–36.0)
MCV: 93.5 fL (ref 80.0–100.0)
Platelets: 157 10*3/uL (ref 150–400)
RBC: 4 MIL/uL — ABNORMAL LOW (ref 4.22–5.81)
RDW: 12.9 % (ref 11.5–15.5)
WBC: 18.6 10*3/uL — ABNORMAL HIGH (ref 4.0–10.5)
nRBC: 0.2 % (ref 0.0–0.2)

## 2023-09-01 LAB — MAGNESIUM: Magnesium: 2.1 mg/dL (ref 1.7–2.4)

## 2023-09-01 LAB — COMPREHENSIVE METABOLIC PANEL WITH GFR
ALT: 50 U/L — ABNORMAL HIGH (ref 0–44)
AST: 92 U/L — ABNORMAL HIGH (ref 15–41)
Albumin: 3.2 g/dL — ABNORMAL LOW (ref 3.5–5.0)
Alkaline Phosphatase: 31 U/L — ABNORMAL LOW (ref 38–126)
Anion gap: 16 — ABNORMAL HIGH (ref 5–15)
BUN: 47 mg/dL — ABNORMAL HIGH (ref 8–23)
CO2: 21 mmol/L — ABNORMAL LOW (ref 22–32)
Calcium: 8.6 mg/dL — ABNORMAL LOW (ref 8.9–10.3)
Chloride: 97 mmol/L — ABNORMAL LOW (ref 98–111)
Creatinine, Ser: 1.81 mg/dL — ABNORMAL HIGH (ref 0.61–1.24)
GFR, Estimated: 38 mL/min — ABNORMAL LOW (ref >60)
Glucose, Bld: 112 mg/dL — ABNORMAL HIGH (ref 70–99)
Potassium: 4.1 mmol/L (ref 3.5–5.1)
Sodium: 134 mmol/L — ABNORMAL LOW (ref 135–145)
Total Bilirubin: 0.8 mg/dL (ref 0.0–1.2)
Total Protein: 5.9 g/dL — ABNORMAL LOW (ref 6.5–8.1)

## 2023-09-01 LAB — HEPARIN LEVEL (UNFRACTIONATED): Heparin Unfractionated: 0.47 [IU]/mL (ref 0.30–0.70)

## 2023-09-01 LAB — POCT ACTIVATED CLOTTING TIME: Activated Clotting Time: 176 s

## 2023-09-01 LAB — LACTIC ACID, PLASMA
Lactic Acid, Venous: 1.5 mmol/L (ref 0.5–1.9)
Lactic Acid, Venous: 1.6 mmol/L (ref 0.5–1.9)

## 2023-09-01 SURGERY — RIGHT/LEFT HEART CATH AND CORONARY ANGIOGRAPHY
Anesthesia: LOCAL

## 2023-09-01 MED ORDER — HEPARIN SODIUM (PORCINE) 1000 UNIT/ML IJ SOLN: INTRAMUSCULAR | Status: DC | PRN

## 2023-09-01 MED ORDER — ASPIRIN 81 MG PO CHEW
81.0000 mg | CHEWABLE_TABLET | Freq: Once | ORAL | Status: AC
  Administered 2023-09-01: 81 mg via ORAL

## 2023-09-01 MED ORDER — MILRINONE LACTATE IN DEXTROSE 20-5 MG/100ML-% IV SOLN
0.1250 ug/kg/min | INTRAVENOUS | Status: DC
  Administered 2023-09-01 – 2023-09-03 (×4): 0.25 ug/kg/min via INTRAVENOUS
  Filled 2023-09-01 (×4): qty 100

## 2023-09-01 MED ORDER — HEPARIN (PORCINE) 25000 UT/250ML-% IV SOLN
950.0000 [IU]/h | INTRAVENOUS | Status: DC
  Administered 2023-09-01: 1000 [IU]/h via INTRAVENOUS
  Administered 2023-09-02: 850 [IU]/h via INTRAVENOUS
  Administered 2023-09-04: 1000 [IU]/h via INTRAVENOUS
  Filled 2023-09-01 (×2): qty 250

## 2023-09-01 MED ORDER — VERAPAMIL HCL 2.5 MG/ML IV SOLN
INTRAVENOUS | Status: AC
  Filled 2023-09-01: qty 2

## 2023-09-01 MED ORDER — FUROSEMIDE 10 MG/ML IJ SOLN
120.0000 mg | Freq: Once | INTRAMUSCULAR | Status: DC
  Filled 2023-09-01: qty 12

## 2023-09-01 MED ORDER — IOHEXOL 350 MG/ML SOLN
INTRAVENOUS | Status: DC | PRN
  Administered 2023-09-01: 50 mL

## 2023-09-01 MED ORDER — MIDAZOLAM HCL 2 MG/2ML IJ SOLN
INTRAMUSCULAR | Status: DC | PRN
  Administered 2023-09-01 (×2): 1 mg via INTRAVENOUS

## 2023-09-01 MED ORDER — LIDOCAINE HCL (PF) 1 % IJ SOLN
INTRAMUSCULAR | Status: DC | PRN
  Administered 2023-09-01: 5 mL
  Administered 2023-09-01 (×2): 2 mL

## 2023-09-01 MED ORDER — VERAPAMIL HCL 2.5 MG/ML IV SOLN
INTRAVENOUS | Status: DC | PRN
  Administered 2023-09-01: 10 mL via INTRA_ARTERIAL

## 2023-09-01 MED ORDER — SODIUM CHLORIDE 0.9% FLUSH
10.0000 mL | Freq: Two times a day (BID) | INTRAVENOUS | Status: DC
  Administered 2023-09-01: 40 mL
  Administered 2023-09-01 – 2023-09-04 (×6): 10 mL
  Administered 2023-09-05: 20 mL
  Administered 2023-09-06 – 2023-09-08 (×4): 10 mL

## 2023-09-01 MED ORDER — SODIUM CHLORIDE 0.9% FLUSH: 10.0000 mL | INTRAVENOUS | Status: DC | PRN

## 2023-09-01 MED ORDER — MIDAZOLAM HCL 2 MG/2ML IJ SOLN
INTRAMUSCULAR | Status: AC
  Filled 2023-09-01: qty 2

## 2023-09-01 MED ORDER — HEPARIN (PORCINE) IN NACL 1000-0.9 UT/500ML-% IV SOLN
INTRAVENOUS | Status: DC | PRN
  Administered 2023-09-01 (×2): 500 mL

## 2023-09-01 MED ORDER — ASPIRIN 81 MG PO CHEW
CHEWABLE_TABLET | ORAL | Status: AC
  Filled 2023-09-01: qty 1

## 2023-09-01 MED ORDER — LIDOCAINE HCL (PF) 1 % IJ SOLN
INTRAMUSCULAR | Status: AC
  Filled 2023-09-01: qty 30

## 2023-09-01 MED ORDER — MILRINONE LACTATE IN DEXTROSE 20-5 MG/100ML-% IV SOLN: 0.2500 ug/kg/min | INTRAVENOUS | Status: DC

## 2023-09-01 MED ORDER — HEPARIN SODIUM (PORCINE) 1000 UNIT/ML IJ SOLN
INTRAMUSCULAR | Status: AC
  Filled 2023-09-01: qty 10

## 2023-09-01 SURGICAL SUPPLY — 15 items
CATH 5FR JL3.5 JR4 ANG PIG MP (CATHETERS) IMPLANT
CATH INFINITI 5F JL4 125CM (CATHETERS) IMPLANT
CATH INFINITI 5FR JL4 (CATHETERS) IMPLANT
CATH SWAN GANZ 7F STRAIGHT (CATHETERS) IMPLANT
GLIDESHEATH SLEND SS 6F .021 (SHEATH) IMPLANT
GUIDEWIRE ANGLED .035X150CM (WIRE) IMPLANT
GUIDEWIRE INQWIRE 1.5J.035X260 (WIRE) IMPLANT
INQWIRE 1.5J .035X260CM (WIRE) ×1 IMPLANT
KIT MICROPUNCTURE NIT STIFF (SHEATH) IMPLANT
PACK CARDIAC CATHETERIZATION (CUSTOM PROCEDURE TRAY) ×1 IMPLANT
SET ATX-X65L (MISCELLANEOUS) IMPLANT
SHEATH PINNACLE 5F 10CM (SHEATH) IMPLANT
SHEATH PINNACLE 7F 10CM (SHEATH) IMPLANT
SHEATH PROBE COVER 6X72 (BAG) IMPLANT
SHIELD CATH-GARD CONTAMINATION (MISCELLANEOUS) IMPLANT

## 2023-09-01 NOTE — Progress Notes (Signed)
 OT Cancellation Note  Patient Details Name: Brian Ruiz MRN: [id number] DOB: 1945/06/02   Cancelled Treatment:    Reason Eval/Treat Not Completed: Patient at procedure or test/ unavailable (cath lab)  Evern Bio 09/01/2023, 12:13 PM Berna Spare, OTR/L Acute Rehabilitation Services Office: [phone]

## 2023-09-01 NOTE — Progress Notes (Signed)
 Patient Name: Brian Ruiz Date of Encounter: 09/01/2023 [facility] Cardiologist: Parke Poisson, MD   Interval Summary  .    Remains cold on exam, oliguric.  AT time of my visit picc had just been placed, no labs to review.  Vital Signs .    Vitals:   08/31/23 2334 09/01/23 0322 09/01/23 0717 09/01/23 0824  BP: (!) 115/98 101/76  98/80  Pulse: (!) 122 (!) 111  72  Resp: 20 18 20 20   Temp: 98 F (36.7 C) 97.9 F (36.6 C)    TempSrc: Oral Oral Oral Oral  SpO2: 98% 96%  98%  Weight:  80.1 kg    Height:        Intake/Output Summary (Last 24 hours) at 09/01/2023 1023 Last data filed at 09/01/2023 0930 Gross per 24 hour  Intake 1075.12 ml  Output 1000 ml  Net 75.12 ml      09/01/2023    3:22 AM 08/31/2023    8:25 PM 08/28/2023    3:45 PM  Last 3 Weights  Weight (lbs) 176 lb 9.4 oz 181 lb 3.5 oz 186 lb 9.6 oz  Weight (kg) 80.1 kg 82.2 kg 84.641 kg      Telemetry/ECG    Afib RVR rate 120-130 - Personally Reviewed  Physical Exam .   GEN: No acute distress.   Neck: JVD to mid 1/3 of neck at 45 deg Cardiac: irregular and tachycardic Respiratory: diminished bilaterally GI: Soft, nontender, non-distended  MS: No edema, very cold extremties.  Assessment & Plan .     #Acute systolic HF.  Concern for low output state - picc, cvp, coox, lactate stat - d/w AHF they will see. Needs R/LHC - need morning labs stat. - give lasix bolus 120 mg now  Follow up: Cvp 12 Coox 52 Cr 1.8 Lactate 2.1 then normalized - did not receive lasix bolus - will send for hemodynamic cath, may need to defer LHC with aki -discussed in detail with patient, in agreement.   INFORMED CONSENT: I have reviewed the risks, indications, and alternatives to cardiac catheterization, possible angioplasty, and stenting with the patient. Risks include but are not limited to bleeding, infection, vascular injury, stroke, myocardial infarction, arrhythmia, kidney injury, radiation-related  injury in the case of prolonged fluoroscopy use, emergency cardiac surgery, and death. The patient understands the risks of serious complication is 1-2 in 1000 with diagnostic cardiac cath and 1-2% or less with angioplasty/stenting.   AHF to advise after RHC if inotropes indicate, suspect we will need milrinone to diurese.  Etiologies of HF include ischemia, covid with stress cardiomyopathy, covid myocarditis. RWMA present, needs coronary eval at some point.   CRITICAL CARE Performed by: Weston Brass, MD   Total critical care time: 35 minutes   Critical care time was exclusive of separately billable procedures and treating other patients.   Critical care was necessary to treat or prevent imminent or life-threatening deterioration.   Critical care was time spent personally by me (independent of APPs or residents) on the following activities: development of treatment plan with patient and/or surrogate as well as nursing, discussions with consultants, evaluation of patient's response to treatment, examination of patient, obtaining history from patient or surrogate, ordering and performing treatments and interventions, ordering and review of laboratory studies, ordering and review of radiographic studies, pulse oximetry and re-evaluation of patient's condition.    For questions or updates, please contact [facility] Please consult [email or web address] for contact info under  Signed, Parke Poisson, MD

## 2023-09-01 NOTE — Progress Notes (Signed)
 PHARMACY - ANTICOAGULATION CONSULT NOTE  Pharmacy Consult for heparin Indication: chest pain/ACS and atrial fibrillation  Allergies  Allergen Reactions   Nsaids Shortness Of Breath    Other reaction(s): Bronchospasm (ALLERGY/intolerance)   Oxycodone Other (See Comments)    Agitation. Per the any pain medication that has codeine in it causes that reaction. Other reaction(s): Other Agitation. Per the any pain medication that has codeine in it causes that reaction.   Tolmetin Shortness Of Breath   Tramadol Anaphylaxis   Fentanyl Other (See Comments)   Codeine Anxiety    Other reaction(s): Bronchospasm (ALLERGY/intolerance), Other (See Comments) Irritable     Patient Measurements: Height: 6' (182.9 cm) Weight: 80.1 kg (176 lb 9.4 oz) IBW/kg (Calculated) : 77.6 HEPARIN DW (KG): 82.2  Vital Signs: Temp: 97.9 F (36.6 C) (04/04 0322) Temp Source: (P) Oral (04/04 0717) BP: 101/76 (04/04 0322) Pulse Rate: 111 (04/04 0322)  Labs: Recent Labs    08/30/23 0537 08/30/23 1738 08/31/23 0530 08/31/23 0705 08/31/23 1056 08/31/23 1301 08/31/23 1802 09/01/23 0438  HGB 14.2  --  14.3  --   --   --   --  12.6*  HCT 43.4  --  44.7  --   --   --   --  37.4*  PLT 200  --  199  --   --   --   --  157  HEPARINUNFRC  --   --   --   --   --   --  0.96* 0.47  CREATININE 1.03 1.19 1.36*  --   --   --   --   --   TROPONINIHS  --   --   --  5,919* 5,340* 4,534*  --   --     Estimated Creatinine Clearance: 49.9 mL/min (A) (by C-G formula based on SCr of 1.36 mg/dL (H)).     Medications:  Medications Prior to Admission  Medication Sig Dispense Refill Last Dose/Taking   acetaminophen (TYLENOL) 500 MG tablet Take 1,000 mg by mouth every 8 (eight) hours as needed for mild pain (pain score 1-3) or moderate pain (pain score 4-6). For pain.   Past Month   Artificial Tear Ointment (DRY EYES OP) Apply 1 drop to eye daily as needed (dry eyes).   Past Month   carvedilol (COREG) 12.5 MG tablet  Take 1 tablet by mouth 2 (two) times daily.   Past Month   gabapentin (NEURONTIN) 600 MG tablet Take 600 mg by mouth 2 (two) times daily.   Past Month   HYDROmorphone (DILAUDID) 4 MG tablet Take 1 tablet by mouth every 8 (eight) hours.   Past Month   omeprazole (PRILOSEC) 40 MG capsule Take 40 mg by mouth daily.   Past Month   senna-docusate (SENOKOT-S) 8.6-50 MG tablet Take 2 tablets by mouth 2 (two) times daily as needed for moderate constipation. 30 tablet 0 Past Month   sertraline (ZOLOFT) 25 MG tablet Take 75 mg by mouth daily.   Past Month    Assessment: 78 YO male presenting with COVID-19. On 4/13 AM, patient developed Afib with RVR, T wave abnormalities and began complaining of chest pain. Patient not on [facility] PTA but did receive a dose of Lovenox 40mg  on 4/2 @0958 . Pharmacy consulted for heparin dosing.  4/4 AM update: HL 0.47 Hgb 12.6 PLT wnl No signs of bleeding or issues w/ gtt reported   Goal of Therapy:  Heparin level 0.3-0.7 units/ml Monitor platelets by anticoagulation protocol: Yes  Plan:  -continue heparin infusion to 1000 units/hr -Daily heparin level, CBC -Plan for [facility] 4/4   Greta Doom BS, PharmD, BCPS Clinical Pharmacist 09/01/2023 7:58 AM  Contact: [phone] after 3 PM  "Be curious, not judgmental..." -Debbora Dus

## 2023-09-01 NOTE — Consult Note (Signed)
 NAME:  Brian Ruiz, MRN:  [id number], DOB:  [birth year], LOS: 3 ADMISSION DATE:  08/28/2023, CONSULTATION DATE: 09/01/23 REFERRING MD:  MD [place]  CHIEF COMPLAINT:  Fever  History of Present Illness:  Pt is a 78 yr old male with HTN, heart murmur, Chronic diastolic heart failure, chronic back pain, scoliosis, and urinary retention who present to [facility] with cough, SOB, and fever on 3/31. Patient was found to have Covid-19, but negative for flu and RSV. Upon arrival to ED, O2 sats were 84% on RA but increased with supplemental oxygen to 95% on 4L per documentation. Initial chest x-ray showed small left, and right basilar atelectasis. Patient was started on empiric antibiotics for CAP, given fluids, and admitted under [facility] service. Patient developed afib with RVR along with chest pain on 4/3. Troponins were elevated to 5,919, concern for NSTEMI since no ST elevation on EKG obtained. Patient was transferred to [facility] that day for further cardiac workup along with schedule cardiac cath on 4/4. Patient's ECHO on 4/3 showed EF 35 to 40%, with moderate decreased function, regional wall abnormalities, and severe concentric left ventricular hypertrophy. RV function normal along with size. 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 TRH consulted Advance HF team for primary management as well as PCCM to follow while in ICU after cardiac cath.   Pertinent  Medical History   Past Medical History:  Diagnosis Date   Arthritis    Back pain, chronic    intolerant to narcotics, avoids NSAIDs due to vioxx related bleed,  sees pain clinic as [facility], prior eval by [facility] neuro in [place]   Cholecystitis    Choledocholithiasis    Chronic diastolic CHF (congestive heart failure) (HCC) 05/16/2018   Per Dr Harvie Bridge note in 2019, pt did not have heart failure but hypertensive heart disease.  Joycelyn Man FNP-C   Encephalopathy acute 05/15/2011   Heart murmur    Hypertension     Inguinal hernia    Polyneuropathy    S/P thyroidectomy 1967   Scoliosis    Urinary retention      Significant Hospital Events: Including procedures, antibiotic start and stop dates in addition to other pertinent events   3/31 admit with Covid-19 and concern for cap-antibiotics, paxlovid started 4/3 Developed Afib RVR, with elevated Troponins/chest pain, concern for NSTEMI transferred to [facility] for cardiac workup  PCCM consult to help in management, AHF team primary    Interim History / Subjective:  Patient from cath lab, alert and oriented x 3 to 4 Answering questions appropriately   Objective   Blood pressure 110/81, pulse 85, temperature 97.9 F (36.6 C), temperature source Oral, resp. rate 20, height 6' (1.829 m), weight 80.1 kg, SpO2 95%. CVP:  [12 mmHg] 12 mmHg      Intake/Output Summary (Last 24 hours) at 09/01/2023 1557 Last data filed at 09/01/2023 1200 Gross per 24 hour  Intake 893.44 ml  Output 700 ml  Net 193.44 ml   Filed Weights   08/28/23 1545 08/31/23 2025 09/01/23 0322  Weight: 84.6 kg 82.2 kg 80.1 kg    Examination: General: acute on chronic elderly male, lying in ICU bed post cardiac cath- no distress  HENT: Normocephalic, PERRLA intact, Poor dentition  Lungs: Clear, diminished throughout, dry cough Cardiovascular: s1,s2, irregular- afib controlled, no JVD, No MRG Abdomen: BS active, soft  Extremities: moves all extremities with easy, extremities cool to touch, right femoral cath site/right jugular site stable-dressing clean dry intact  Neuro:  Alert and oriented x4, forgetful at times, follows commands GU: deferred   Resolved Hospital Problem list   N/a   Assessment & Plan:  Cardiogenic Shock  Acute Systolic Heart Failure NSTEMI  4/3 Troponin 5919, BNP 1,951.7, beside ECHO Large LAD territory infarct vs Takotsubo CM vs myocarditis in setting Of Covid 19?   - concern for low cardiac output, Coox 52.2 R and LFHC completed 4/4  P:  Milrinone gtt  ordered, start  Continue to monitor CVP q 4 Continue diuresis, defer to HF team (primary)-appreciate assistance and recs  MAP goal > 65, continue to monitor on cardiac telemetry  Continue T band protocol, continue to monitor for hematoma or bleeding at  Right internal jugular, right femoral, and right radial sites  Monitor daily weights, monitor intake and output   Acute hypoxic respiratory failure in setting of Covid-19  Covid PCR Positive on 3/31, also wife at home positive for Covid Started Paxlovid and steroids on 3/31  Originally on CAP coverage empirically  Procal .27, WBC trending up- suspecting reactive inflammatory response to steroids vs shock state If patient develops fever with trend, and procal increases-low threshold for starting antibiotics  P: Continue course of Paxlovid for 5 doses  Continue dexamethazone  Continue mucinex prn Continue duonebs prn  On 5 L [place], O2 sats stable, O2 sat goal 92%, wean as tolerated Continue Airborne Precautions Order IS, flutter valve, PT/OT consult  Repeat procal 4/5   Acute Metabolic Encephalopathy secondary to Shock vs Hypoxia in settting  Of Covid-19  Patient alert and oriented, though forgetful at times per nursing staff  P:  Continue monitor mental status, notify if has change in current baseline Continue supportive care as above  Initiate delirium and fall precautions Mobilize when appropriate   Afib RVR  P: Continue amio gtt  Restart heparin gtt when appropriate after heart cath  Monitor electrolytes, mag, phos daily with BMET  Continue cardiac tele   AKI secondary to Cardiogenic shock Anion Gap Metabolic Acidosis  Hyponatremia in setting of heart failure  Cr 1.81<1.36 < 1.19  P: Continue to trend renal function daily  Continue to monitor and optimize electrolytes daily Continue to monitor urine output Continue strict I/Os Continue Adequate renal perfusion  Avoid nephrotoxic agents   Transminitis in setting of  cardiogenic shock P:  Continue to trend LFTs, obtain CMET on 4/5   Chronic Back Pain  P:  Continue current pain regimen -tylenol, and diluadid prn   GOC P:  If patient worsens, recommend palliative care consult and transition to comfort care.   Best Practice per primary    Diet/type: per primary  DVT prophylaxis systemic heparin Pressure ulcer(s): N/A GI prophylaxis: PPI Lines: yes and it is still needed PICC Foley:  N/A Code Status:  DNR Last date of multidisciplinary goals of care discussion [ per primary ]   Labs   CBC: Recent Labs  Lab 08/28/23 1623 08/29/23 0542 08/30/23 0537 08/31/23 0530 09/01/23 0438 09/01/23 1032  WBC 7.5 8.6 14.9* 17.3* 18.6* 21.6*  NEUTROABS 6.4  --   --  14.4*  --  18.2*  HGB 13.2 11.9* 14.2 14.3 12.6* 14.4  HCT 41.5 37.6* 43.4 44.7 37.4* 42.4  MCV 96.1 97.2 94.8 96.1 93.5 91.2  PLT 190 160 200 199 157 207    Basic Metabolic Panel: Recent Labs  Lab 08/29/23 0542 08/30/23 0537 08/30/23 1738 08/31/23 0530 09/01/23 1032  NA 133* 134* 131* 131* 134*  K 4.0 3.5 3.9 3.8 4.1  CL 101 99 96* 96* 97*  CO2 23 21* 21* 23 21*  GLUCOSE 92 143* 141* 118* 112*  BUN 15 15 21  29* 47*  CREATININE 0.96 1.03 1.19 1.36* 1.81*  CALCIUM 8.9 9.4 9.1 8.6* 8.6*  MG  --  1.8 1.8 2.0 2.1  PHOS  --  4.0 4.4  --   --    GFR: Estimated Creatinine Clearance: 37.5 mL/min (A) (by C-G formula based on SCr of 1.81 mg/dL (H)). Recent Labs  Lab 08/30/23 0537 08/30/23 1738 08/30/23 1747 08/31/23 0530 08/31/23 1938 09/01/23 0438 09/01/23 1032 09/01/23 1110  PROCALCITON  --  0.16  --   --  0.27  --   --   --   WBC 14.9*  --   --  17.3*  --  18.6* 21.6*  --   LATICACIDVEN  --   --  1.8  --  2.1*  --  1.5 1.6    Liver Function Tests: Recent Labs  Lab 08/28/23 1623 08/30/23 0537 08/30/23 1738 08/31/23 0530 09/01/23 1032  AST 21 138*  --  136* 92*  ALT 13 35  --  43 50*  ALKPHOS 60 49  --  41 31*  BILITOT 0.6 0.9  --  0.6 0.8  PROT 7.3 7.4  --   6.2* 5.9*  ALBUMIN 4.3 3.9 4.1 3.4* 3.2*   No results for input(s): "LIPASE", "AMYLASE" in the last 168 hours. No results for input(s): "AMMONIA" in the last 168 hours.  ABG    Component Value Date/Time   PHART 7.39 08/31/2023 1806   PCO2ART 33 08/31/2023 1806   PO2ART 83 08/31/2023 1806   HCO3 20.1 08/31/2023 1806   TCO2 25 04/30/2012 0648   ACIDBASEDEF 4.2 (H) 08/31/2023 1806   O2SAT 52.2 09/01/2023 1139     Coagulation Profile: Recent Labs  Lab 08/28/23 1623  INR 1.0    Cardiac Enzymes: No results for input(s): "CKTOTAL", "CKMB", "CKMBINDEX", "TROPONINI" in the last 168 hours.  HbA1C: No results found for: "HGBA1C"  CBG: Recent Labs  Lab 08/30/23 0856  GLUCAP 133*    Review of Systems:   See HPI   Past Medical History:  He,  has a past medical history of Arthritis, Back pain, chronic, Cholecystitis, Choledocholithiasis, Chronic diastolic CHF (congestive heart failure) (HCC) (05/16/2018), Encephalopathy acute (05/15/2011), Heart murmur, Hypertension, Inguinal hernia, Polyneuropathy, S/P thyroidectomy (1967), Scoliosis, and Urinary retention.   Surgical History:   Past Surgical History:  Procedure Laterality Date   BACK SURGERY  1975   [id number]   CHOLECYSTECTOMY N/A 11/13/2020   Procedure: LAPAROSCOPIC CHOLECYSTECTOMY WITH INTRAOPERATIVE CHOLANGIOGRAM;  Surgeon: Violeta Gelinas, MD;  Location: [facility] OR;  Service: General;  Laterality: N/A;   COLONOSCOPY     FOOT SURGERY  1985, 2000   due to arthritis   HAND SURGERY  2005   left - due to arthritis   INGUINAL HERNIA REPAIR  04/30/2012   Procedure: HERNIA REPAIR INGUINAL ADULT;  Surgeon: Currie Paris, MD;  Location: [facility];  Service: General;  Laterality: Left;  repair left inguinal hernia   THYROIDECTOMY  1967   TONSILLECTOMY       Social History:   reports that he has never smoked. He has never been exposed to tobacco smoke. He has never used smokeless tobacco. He reports that he does  not drink alcohol and does not use drugs.   Family History:  His family history includes Heart disease in his father.   Allergies Allergies  Allergen Reactions   Nsaids Shortness Of Breath    Other reaction(s): Bronchospasm (ALLERGY/intolerance)   Oxycodone Other (See Comments)    Agitation. Per the any pain medication that has codeine in it causes that reaction. Other reaction(s): Other Agitation. Per the any pain medication that has codeine in it causes that reaction.   Tolmetin Shortness Of Breath   Tramadol Anaphylaxis   Fentanyl Other (See Comments)   Codeine Anxiety    Other reaction(s): Bronchospasm (ALLERGY/intolerance), Other (See Comments) Irritable      Home Medications  Prior to Admission medications   Medication Sig Start Date End Date Taking? Authorizing Provider  acetaminophen (TYLENOL) 500 MG tablet Take 1,000 mg by mouth every 8 (eight) hours as needed for mild pain (pain score 1-3) or moderate pain (pain score 4-6). For pain.   Yes [provider]  Artificial Tear Ointment (DRY EYES OP) Apply 1 drop to eye daily as needed (dry eyes).   Yes [provider]  carvedilol (COREG) 12.5 MG tablet Take 1 tablet by mouth 2 (two) times daily.   Yes [provider]  gabapentin (NEURONTIN) 600 MG tablet Take 600 mg by mouth 2 (two) times daily. 07/15/23  Yes [provider]  HYDROmorphone (DILAUDID) 4 MG tablet Take 1 tablet by mouth every 8 (eight) hours. 08/28/23  Yes [provider]  omeprazole (PRILOSEC) 40 MG capsule Take 40 mg by mouth daily. 09/07/20  Yes [provider]  senna-docusate (SENOKOT-S) 8.6-50 MG tablet Take 2 tablets by mouth 2 (two) times daily as needed for moderate constipation. 11/15/20  Yes Azucena Fallen, MD  sertraline (ZOLOFT) 25 MG tablet Take 75 mg by mouth daily.   Yes [provider]     Critical care time: n/a     45 mins  Christian [place] Head AGACNP-BC   [facility] 09/01/2023, 5:50 PM  Please see [email or web address] for pager details.  From 7A-7P if no response, please call [phone]. After hours, please call ELink [phone].

## 2023-09-01 NOTE — Consult Note (Signed)
 Advanced Heart Failure Team Consult Note   Primary Physician: [facility] Cardiologist:  Parke Poisson, MD  Reason for Consultation: Acute systolic CHF  HPI:    Brian Ruiz is seen today for evaluation of acute systolic CHF at the request of Dr. Jacques Navy with [facility]. 78 y.o. male with history of HTN, remote thryoidectomy, palpitations, chronic pain syndrome with opiod dependence, hx GI bleed.   Presented to [facility] 08/28/23 with generalized weakness. Reports a couple of falls at home prior to arrival. Notes falling onto his knees in his bathroom. Attempted to stand up and fell back down. Believes he lost consciousness. Notes memory impairment worsening over the last couple of years. He was hypoic with O2 sats in the 80s. Respiratory panel positive for COVID. Given 2L IVF, paxlovid, steroids and abx. He was admitted for further management. BNP > 2,000 and given IV lasix. Later developed atrial fibrillation with RVR and chest pain. HS troponin > 5900. Given IV metoprolol with improvement in rate control. Cardiology consulted. Urine output dropped off despite lasix. Echo with EF 20-25%, RWMA in LAD territory, severe LVH, RV okay. Cool on exam. Concern for low-output HF. Initial CO-OX 52%, lactic acid 2.1 then cleared. Advanced Heart Failure asked to see to assist with management.   Home Medications Prior to Admission medications   Medication Sig Start Date End Date Taking? Authorizing Provider  acetaminophen (TYLENOL) 500 MG tablet Take 1,000 mg by mouth every 8 (eight) hours as needed for mild pain (pain score 1-3) or moderate pain (pain score 4-6). For pain.   Yes [provider]  Artificial Tear Ointment (DRY EYES OP) Apply 1 drop to eye daily as needed (dry eyes).   Yes [provider]  carvedilol (COREG) 12.5 MG tablet Take 1 tablet by mouth 2 (two) times daily.   Yes [provider]  gabapentin (NEURONTIN) 600 MG tablet Take 600 mg by mouth  2 (two) times daily. 07/15/23  Yes [provider]  HYDROmorphone (DILAUDID) 4 MG tablet Take 1 tablet by mouth every 8 (eight) hours. 08/28/23  Yes [provider]  omeprazole (PRILOSEC) 40 MG capsule Take 40 mg by mouth daily. 09/07/20  Yes [provider]  senna-docusate (SENOKOT-S) 8.6-50 MG tablet Take 2 tablets by mouth 2 (two) times daily as needed for moderate constipation. 11/15/20  Yes Azucena Fallen, MD  sertraline (ZOLOFT) 25 MG tablet Take 75 mg by mouth daily.   Yes [provider]    Past Medical History: Past Medical History:  Diagnosis Date   Arthritis    Back pain, chronic    intolerant to narcotics, avoids NSAIDs due to vioxx related bleed,  sees pain clinic as [place], prior eval by [facility] neuro in [place]   Cholecystitis    Choledocholithiasis    Chronic diastolic CHF (congestive heart failure) (HCC) 05/16/2018   Per Dr Harvie Bridge note in 2019, pt did not have heart failure but hypertensive heart disease.  Joycelyn Man FNP-C   Encephalopathy acute 05/15/2011   Heart murmur    Hypertension    Inguinal hernia    Polyneuropathy    S/P thyroidectomy 1967   Scoliosis    Urinary retention     Past Surgical History: Past Surgical History:  Procedure Laterality Date   BACK SURGERY  1975   [id number]   CHOLECYSTECTOMY N/A 11/13/2020   Procedure: LAPAROSCOPIC CHOLECYSTECTOMY WITH INTRAOPERATIVE CHOLANGIOGRAM;  Surgeon: Violeta Gelinas, MD;  Location: [facility] OR;  Service: General;  Laterality:  N/A;   COLONOSCOPY     FOOT SURGERY  1985, 2000   due to arthritis   HAND SURGERY  2005   left - due to arthritis   INGUINAL HERNIA REPAIR  04/30/2012   Procedure: HERNIA REPAIR INGUINAL ADULT;  Surgeon: Currie Paris, MD;  Location: [facility];  Service: General;  Laterality: Left;  repair left inguinal hernia   THYROIDECTOMY  1967   TONSILLECTOMY      Family History: Family History  Problem Relation Age of Onset   Heart  disease Father     Social History: Social History   Socioeconomic History   Marital status: Married    Spouse name: Not on file   Number of children: Not on file   Years of education: Not on file   Highest education level: Not on file  Occupational History   Not on file  Tobacco Use   Smoking status: Never    Passive exposure: Never   Smokeless tobacco: Never  Vaping Use   Vaping status: Never Used  Substance and Sexual Activity   Alcohol use: No   Drug use: No   Sexual activity: Yes    Partners: Female  Other Topics Concern   Not on file  Social History Narrative   Quit ETOH > 20years ago   Disabled due to back pain   Non-smoker   Lives at home   Wife Conservation officer, nature   Son +   Social Drivers of Health   Financial Resource Strain: Low Risk  (02/08/2021)   Received from [facility]   Overall Financial Resource Strain (CARDIA)    Difficulty of Paying Living Expenses: Not hard at all  Food Insecurity: No Food Insecurity (08/28/2023)   Hunger Vital Sign    Worried About Running Out of Food in the Last Year: Never true    Ran Out of Food in the Last Year: Never true  Transportation Needs: No Transportation Needs (08/28/2023)   PRAPARE - Administrator, Civil Service (Medical): No    Lack of Transportation (Non-Medical): No  Physical Activity: Inactive (02/08/2021)   Received from [facility]   Exercise Vital Sign    Days of Exercise per Week: 0 days    Minutes of Exercise per Session: 0 min  Stress: No Stress Concern Present (02/08/2021)   Received from [facility], [facility] Occupational [facility] - Occupational Stress Questionnaire    Feeling of Stress : Not at all  Social Connections: Socially Integrated (08/28/2023)   Social Connection and Isolation Panel [NHANES]    Frequency of Communication with Friends and Family: Three times a week    Frequency of Social Gatherings with Friends and Family: Three  times a week    Attends Religious Services: More than 4 times per year    Active Member of Clubs or Organizations: Yes    Attends Banker Meetings: 1 to 4 times per year    Marital Status: Married    Allergies:  Allergies  Allergen Reactions   Nsaids Shortness Of Breath    Other reaction(s): Bronchospasm (ALLERGY/intolerance)   Oxycodone Other (See Comments)    Agitation. Per the any pain medication that has codeine in it causes that reaction. Other reaction(s): Other Agitation. Per the any pain medication that has codeine in it causes that reaction.   Tolmetin Shortness Of Breath   Tramadol Anaphylaxis   Fentanyl Other (See Comments)  Codeine Anxiety    Other reaction(s): Bronchospasm (ALLERGY/intolerance), Other (See Comments) Irritable     Objective:    Vital Signs:   Temp:  [97.6 F (36.4 C)-98 F (36.7 C)] 97.9 F (36.6 C) (04/04 0322) Pulse Rate:  [72-122] 72 (04/04 0824) Resp:  [18-27] 20 (04/04 0824) BP: (98-124)/(63-98) 98/80 (04/04 0824) SpO2:  [95 %-98 %] 95 % (04/04 1211) Weight:  [80.1 kg-82.2 kg] 80.1 kg (04/04 0322) Last BM Date : 09/01/23  Weight change: Filed Weights   08/28/23 1545 08/31/23 2025 09/01/23 0322  Weight: 84.6 kg 82.2 kg 80.1 kg    Intake/Output:   Intake/Output Summary (Last 24 hours) at 09/01/2023 1314 Last data filed at 09/01/2023 0930 Gross per 24 hour  Intake 877.45 ml  Output 1000 ml  Net -122.55 ml      Physical Exam    General:  Acute on chronically ill appearing Neck: JVP 10-12 Cor: Irregularly irregular rhythm, tachycardic. No rubs, gallops or murmurs. Lungs: clear Abdomen: soft, nontender, nondistended.  Extremities: extremities are cool Neuro: Alert and oriented X 3. Speech repetitive.   Telemetry   Afib 120s  Labs   Basic Metabolic Panel: Recent Labs  Lab 08/29/23 0542 08/30/23 0537 08/30/23 1738 08/31/23 0530 09/01/23 1032  NA 133* 134* 131* 131* 134*  K 4.0 3.5 3.9 3.8 4.1  CL 101  99 96* 96* 97*  CO2 23 21* 21* 23 21*  GLUCOSE 92 143* 141* 118* 112*  BUN 15 15 21  29* 47*  CREATININE 0.96 1.03 1.19 1.36* 1.81*  CALCIUM 8.9 9.4 9.1 8.6* 8.6*  MG  --  1.8 1.8 2.0 2.1  PHOS  --  4.0 4.4  --   --     Liver Function Tests: Recent Labs  Lab 08/28/23 1623 08/30/23 0537 08/30/23 1738 08/31/23 0530 09/01/23 1032  AST 21 138*  --  136* 92*  ALT 13 35  --  43 50*  ALKPHOS 60 49  --  41 31*  BILITOT 0.6 0.9  --  0.6 0.8  PROT 7.3 7.4  --  6.2* 5.9*  ALBUMIN 4.3 3.9 4.1 3.4* 3.2*   No results for input(s): "LIPASE", "AMYLASE" in the last 168 hours. No results for input(s): "AMMONIA" in the last 168 hours.  CBC: Recent Labs  Lab 08/28/23 1623 08/29/23 0542 08/30/23 0537 08/31/23 0530 09/01/23 0438 09/01/23 1032  WBC 7.5 8.6 14.9* 17.3* 18.6* 21.6*  NEUTROABS 6.4  --   --  14.4*  --  18.2*  HGB 13.2 11.9* 14.2 14.3 12.6* 14.4  HCT 41.5 37.6* 43.4 44.7 37.4* 42.4  MCV 96.1 97.2 94.8 96.1 93.5 91.2  PLT 190 160 200 199 157 207    Cardiac Enzymes: No results for input(s): "CKTOTAL", "CKMB", "CKMBINDEX", "TROPONINI" in the last 168 hours.  BNP: BNP (last 3 results) Recent Labs    08/30/23 1738 08/31/23 0705  BNP 2,770.5* 1,951.7*    ProBNP (last 3 results) No results for input(s): "PROBNP" in the last 8760 hours.   CBG: Recent Labs  Lab 08/30/23 0856  GLUCAP 133*    Coagulation Studies: No results for input(s): "LABPROT", "INR" in the last 72 hours.   Imaging   DG Chest Port 1 View Result Date: 09/01/2023 CLINICAL DATA:  PICC placement. EXAM: PORTABLE CHEST 1 VIEW COMPARISON:  August 31, 2023 FINDINGS: Stable cardiomediastinal silhouette. Right-sided PICC line is noted with distal tip in expected position of SVC. Left perihilar opacity is noted concerning for pneumonia or edema. Stable elevated  right hemidiaphragm with minimal right basilar sub small liked assist. Bony thorax is unremarkable. IMPRESSION: Interval placement of right-sided  PICC line with distal tip in expected position of SVC. Electronically Signed   By: Lupita Raider M.D.   On: 09/01/2023 10:15   CT HEAD WO CONTRAST ( ) Result Date: 08/31/2023 CLINICAL DATA:  Fall with memory loss EXAM: CT HEAD WITHOUT CONTRAST TECHNIQUE: Contiguous axial images were obtained from the base of the skull through the vertex without intravenous contrast. RADIATION DOSE REDUCTION: This exam was performed according to the departmental dose-optimization program which includes automated exposure control, adjustment of the mA and/or kV according to patient size and/or use of iterative reconstruction technique. COMPARISON:  None Available. FINDINGS: Brain: There is no mass, hemorrhage or extra-axial collection. The size and configuration of the ventricles and extra-axial CSF spaces are normal. There is hypoattenuation of the white matter, most commonly indicating chronic small vessel disease. Vascular: No hyperdense vessel or unexpected vascular calcification. Skull: The visualized skull base, calvarium and extracranial soft tissues are normal. Sinuses/Orbits: No fluid levels or advanced mucosal thickening of the visualized paranasal sinuses. No mastoid or middle ear effusion. Normal orbits. Other: None. IMPRESSION: 1. No acute intracranial abnormality. 2. Chronic small vessel disease. Electronically Signed   By: Deatra Robinson M.D.   On: 08/31/2023 21:16   DG Chest 1 View Result Date: 08/31/2023 CLINICAL DATA:  Respiratory distress EXAM: CHEST  1 VIEW COMPARISON:  Chest x-ray 08/30/2023 FINDINGS: There stable moderate elevation of the right hemidiaphragm. There some diffuse hazy airspace opacities in the left upper lobe which have increased. The heart is enlarged, unchanged. There are atherosclerotic calcifications of the aorta. There is no pleural effusion or pneumothorax. Degenerative changes affect the left shoulder. IMPRESSION: 1. Increasing hazy airspace opacities in the left upper lobe,  concerning for pneumonia. 2. Stable cardiomegaly. 3. Stable moderate elevation of the right hemidiaphragm. Electronically Signed   By: Darliss Cheney M.D.   On: 08/31/2023 17:57   [place] EKG SITE RITE Result Date: 08/31/2023 If Site Rite image not attached, placement could not be confirmed due to current cardiac rhythm.    Medications:     Current Medications:  [MAR Hold] Chlorhexidine Gluconate Cloth  6 each Topical Daily   [MAR Hold] dexamethasone (DECADRON) injection  6 mg Intravenous Daily   [MAR Hold] nirmatrelvir/ritonavir  3 tablet Oral BID   [MAR Hold] pantoprazole  40 mg Oral Daily   [MAR Hold] sertraline  25 mg Oral TID   [MAR Hold] sodium chloride flush  10-40 mL Intracatheter Q12H   [MAR Hold] sodium chloride flush  3 mL Intravenous Q12H    Infusions:  sodium chloride 10 mL/hr at 09/01/23 0900   amiodarone 30 mg/hr (09/01/23 1048)   [MAR Hold] furosemide     heparin 1,000 Units/hr (09/01/23 0900)     Assessment/Plan   Acute systolic CHF with low output>>cardiogenic shock: -Etiology not certain. HS troponin > 5900. Echo with EF 20-25% and RWMA in LAD territory, severe LVH. Findings suggest Takotsubo CM vs large LAD territory infarct -Urgent R/LHC today -He is cool on exam. UOP has dropped off. Lactic acid has cleared but strong suspicion for low-output clinically -Suspect will need inotrope support. Would not be a good candidate for MCS with age, comorbidities/memory issues. Depending on progress may need to consider Palliative care involvement.  2. NSTEMI: -Reported chest pain this admission. HS troponin peaked at > 5900 as above.  -Potentially ACS vs Takotsubo -Cath as above -Heparin  gtt, 81 mg aspirin daily -Decide on statin pending cath  3. Atrial fibrillation with RVR -Start amiodarone gtt -Heparin gtt  4. COVID-19 infection Acute respiratory failure  -Continue Paxlovid for 5 day course -Supplemental O2 as needed  5. AKI -Scr baseline around 1, up to  1.8 -In setting of suspected shock  6. Elevated LFTs -In setting of cardiogenic shock -Follow  7. Chronic pain syndrome -On dilaudid 4 mg po q 8 hrs PRN  8. Memory impairment -? Dementia -Notes declining memory over several years. Watch for delirium.  Length of Stay: 3  Uzoma Vivona N, PA-C  09/01/2023, 1:14 PM  Advanced Heart Failure Team Pager [phone] (M-F; 7a - 5p)  Please contact [facility] for night-coverage after hours (4p -7a ) and weekends on [email or web address]

## 2023-09-01 NOTE — Progress Notes (Signed)
 Dr. Shari Heritage text paged IV team unable to put [facility] line in tonight. M.D. also aware of L.A.- 2.1. No orders at this time

## 2023-09-01 NOTE — Progress Notes (Signed)
 PHARMACY - ANTICOAGULATION CONSULT NOTE  Pharmacy Consult for heparin Indication: chest pain/ACS and atrial fibrillation  Allergies  Allergen Reactions   Nsaids Shortness Of Breath    Other reaction(s): Bronchospasm (ALLERGY/intolerance)   Oxycodone Other (See Comments)    Agitation. Per the any pain medication that has codeine in it causes that reaction. Other reaction(s): Other Agitation. Per the any pain medication that has codeine in it causes that reaction.   Tolmetin Shortness Of Breath   Tramadol Anaphylaxis   Fentanyl Other (See Comments)   Codeine Anxiety    Other reaction(s): Bronchospasm (ALLERGY/intolerance), Other (See Comments) Irritable     Patient Measurements: Height: 6' (182.9 cm) Weight: 80.1 kg (176 lb 9.4 oz) IBW/kg (Calculated) : 77.6 HEPARIN DW (KG): 82.2  Vital Signs: Temp: 97.7 F (36.5 C) (04/04 1600) Temp Source: Oral (04/04 1600) BP: 118/79 (04/04 1715) Pulse Rate: 58 (04/04 1715)  Labs: Recent Labs    08/30/23 1738 08/31/23 0530 08/31/23 0705 08/31/23 1056 08/31/23 1301 08/31/23 1802 09/01/23 0438 09/01/23 1032 09/01/23 1241  HGB  --  14.3  --   --   --   --  12.6* 14.4 14.6  14.6  HCT  --  44.7  --   --   --   --  37.4* 42.4 43.0  43.0  PLT  --  199  --   --   --   --  157 207  --   HEPARINUNFRC  --   --   --   --   --  0.96* 0.47  --   --   CREATININE 1.19 1.36*  --   --   --   --   --  1.81*  --   TROPONINIHS  --   --  5,919* 5,340* 4,534*  --   --   --   --     Estimated Creatinine Clearance: 37.5 mL/min (A) (by C-G formula based on SCr of 1.81 mg/dL (H)).     Medications:  Medications Prior to Admission  Medication Sig Dispense Refill Last Dose/Taking   acetaminophen (TYLENOL) 500 MG tablet Take 1,000 mg by mouth every 8 (eight) hours as needed for mild pain (pain score 1-3) or moderate pain (pain score 4-6). For pain.   Past Month   Artificial Tear Ointment (DRY EYES OP) Apply 1 drop to eye daily as needed (dry  eyes).   Past Month   carvedilol (COREG) 12.5 MG tablet Take 1 tablet by mouth 2 (two) times daily.   Past Month   gabapentin (NEURONTIN) 600 MG tablet Take 600 mg by mouth 2 (two) times daily.   Past Month   HYDROmorphone (DILAUDID) 4 MG tablet Take 1 tablet by mouth every 8 (eight) hours.   Past Month   omeprazole (PRILOSEC) 40 MG capsule Take 40 mg by mouth daily.   Past Month   senna-docusate (SENOKOT-S) 8.6-50 MG tablet Take 2 tablets by mouth 2 (two) times daily as needed for moderate constipation. 30 tablet 0 Past Month   sertraline (ZOLOFT) 25 MG tablet Take 75 mg by mouth daily.   Past Month    Assessment: 78 YO male presenting with COVID-19. On 4/13 AM, patient developed Afib with RVR, T wave abnormalities and began complaining of chest pain. Patient not on [facility] PTA but did receive a dose of Lovenox 40mg  on 4/2 @0958 . Pharmacy consulted for heparin dosing.  4/4 AM update: HL 0.47 Hgb 12.6 PLT wnl No signs of bleeding or issues w/ gtt  reported   PM: now s/p cath. Plan to restart heparin in 6hrs (pt removed TR band).  Goal of Therapy:  Heparin level 0.3-0.7 units/ml Monitor platelets by anticoagulation protocol: Yes   Plan:  -AT 2300, resume heparin infusion to 1000 units/hr (no bolus) -Check heparin level 8hrs after resuming -Daily heparin level, CBC -F/u long term plans for [facility]   Loralee Pacas, PharmD, BCPS Clinical Pharmacist 09/01/2023 5:21 PM  Contact: [phone] after 3 PM  "Be curious, not judgmental..." -Debbora Dus

## 2023-09-01 NOTE — Plan of Care (Signed)
   Problem: Education: Goal: Knowledge of risk factors and measures for prevention of condition will improve Outcome: Progressing   Problem: Coping: Goal: Psychosocial and spiritual needs will be supported Outcome: Progressing

## 2023-09-01 NOTE — Progress Notes (Signed)
 Pt in cath recovery room 5 with door shut d/t airborne precautions. RN gave pt instructions regarding R radial cath site and R fem art cath site. Pt verbalized understanding. RN Left room after first site check. ~1400 RN witnessed pt sitting up in bed with TR band dangling on the bed rail.  RN found radial site not bleeding, but small bruise developing around site. No hematoma. RN placed manual pressure until additional RN reached MD. Verbal orders given to reapply TR and inflate with 10cc.  R fem site stable.

## 2023-09-01 NOTE — Progress Notes (Signed)
 Peripherally Inserted Central Catheter Placement  The IV Nurse has discussed with the patient and/or persons authorized to consent for the patient, the purpose of this procedure and the potential benefits and risks involved with this procedure.  The benefits include less needle sticks, lab draws from the catheter, and the patient may be discharged home with the catheter. Risks include, but not limited to, infection, bleeding, blood clot (thrombus formation), and puncture of an artery; nerve damage and irregular heartbeat and possibility to perform a PICC exchange if needed/ordered by physician.  Alternatives to this procedure were also discussed.  Bard Power PICC patient education guide, fact sheet on infection prevention and patient information card has been provided to patient /or left at bedside.    PICC Placement Documentation  PICC Triple Lumen 09/01/23 Right Basilic 42 cm 0 cm (Active)  Indication for Insertion or Continuance of Line Chronic illness with exacerbations (CF, Sickle Cell, etc.) 09/01/23 0911  Exposed Catheter (cm) 0 cm 09/01/23 0911  Site Assessment Clean, Dry, Intact 09/01/23 0911  Lumen #1 Status Flushed;Saline locked;Blood return noted 09/01/23 0911  Lumen #2 Status Flushed;Saline locked;Blood return noted 09/01/23 0911  Lumen #3 Status Flushed;Saline locked;Blood return noted 09/01/23 0911  Dressing Type Transparent;Securing device 09/01/23 0911  Dressing Status Antimicrobial disc/dressing in place 09/01/23 0911  Line Care Connections checked and tightened 09/01/23 0911  Line Adjustment (NICU/IV Team Only) No 09/01/23 0911  Dressing Intervention New dressing;Adhesive placed at insertion site (IV team only) 09/01/23 0911  Dressing Change Due 09/08/23 09/01/23 0911       Vernona Rieger  Jerral Mccauley 09/01/2023, 9:12 AM

## 2023-09-01 NOTE — Progress Notes (Signed)
 PT Cancellation Note  Patient Details Name: Brian Ruiz MRN: [id number] DOB: 06/02/1945   Cancelled Treatment:    Reason Eval/Treat Not Completed: (P) Patient at procedure or test/unavailable, pt off unit at cath lab. Will check back as schedule allows to continue with PT POC.  Lenora Boys. PTA Acute Rehabilitation Services Office: [phone]    Catalina Antigua 09/01/2023, 12:48 PM

## 2023-09-01 NOTE — Progress Notes (Addendum)
 PROGRESS NOTE    Brian Ruiz  XLK:[id number] DOB: 09/10/1945 DOA: 08/28/2023 PCP: Bonnetta Barry, [facility]    Brief Narrative:   Brian Ruiz is a 78 y.o. male with past medical history significant for HTN, depression/anxiety, chronic pain syndrome with opioid dependence who presented to [facility] on 3/31 with complaints of generalized weakness and shortness of breath.  Upon evaluation in the emergency department patient was found to be positive for COVID-19 via PCR.  Patient was additionally found to be hypoxic requiring initiation of supplemental oxygen.  TRH consulted for admission.  Significant Hospital events: 3/31: Admit to [facility], COVID viral pneumonia 4/3: A-fib with RVR, elevated troponin 5919 w/ concern for NSTEMI; started on amiodarone/heparin drip; transfer to [facility] 4/4: s/p Cardiac catheterization, transferring to 2H   Assessment & Plan:   Acute metabolic encephalopathy Substantial confusion, likely secondary to waxing and waning hypoxia due to respiratory failure on admission, improved with treatment as below. -- Supportive care, continue treatment as below  Acute respiratory failure with hypoxia COVID-19 viral infection Patient presenting to ED with generalized weakness, shortness of breath was found to be positive for COVID-19 on PCR.  Chest x-ray with no acute cardiopulmonary disease process.  Patient was started on IV antibiotics for short course for concerns of possible pneumonia however procalcitonin unremarkable and therefore was discontinued. -- Continue Paxlovid to complete 5-day course -- Dexamethasone -- Continue supplemental oxygen, maintain SpO2 greater than 92%  NSTEMI (non-ST elevated myocardial infarction) Patient began to complain of chest discomfort early in the morning on 4/3 in the midst of rapid atrial fibrillation; initial troponin noted to be elevated 5919. Echocardiogram revealing severe hypokinesis of the left  ventricular mid apical segment anterior wall lateral and inferior segment concerning for either large LAD territory ischemia or Takotsubo's cardiomyopathy. -- Cardiology following, appreciate assistance -- Troponin [phone] -- Continue heparin drip -- Right/left heart catheterization pending today  Acute systolic congestive heart failure TTE with LVEF 35-40% (previously 55 to 60% 08/14/2017), LV with moderately decreased function, positive wall motion abnormalities, LA moderately dilated, trivial MR, noted aortic dilation 39 mm. -- Cardiology following, appreciate assistance -- Furosemide 120 mg IV x 1 today -- Strict I's and O's, daily weights -- BMP daily   Acute renal failure concerning for cardiorenal syndrome -- Cr 1.10>0.96>10.3>1.19>1.36>1.81 -- Repeat CMP in a.m.  Elevated LFTs Likely secondary to congestion versus poor perfusion due to decompensated CHF. -- CMP daily  Essential hypertension Home regimen of Coreg has been discontinued per cardiology.  Chronic pain syndrome -- Dilaudid 4 mg p.o. every 8 hours as needed severe pain  Depression with anxiety -- Zoloft 25 mg TID   DVT prophylaxis:     Code Status: Limited: Do not attempt resuscitation (DNR) -DNR-LIMITED -Do Not Intubate/DNI  Family Communication: No family present at bedside this morning  Disposition Plan:  Level of care: Progressive Status is: Inpatient Remains inpatient appropriate because: Left/right heart catheterization, cardiology plans transfer to [facility]    Consultants:  Cardiology, heart failure PCCM  Procedures:  Heart catheterization  Antimicrobials:  Azithromycin 3/31, 4/2 Ceftriaxone 3/31, 4/2   Subjective: Patient seen examined bedside, lying in bed.  Alert and oriented;  awaiting heart catheterization later this morning.  Continues to endorse shortness of breath, remains on 5 L nasal cannula.  On Paxlovid/Decadron for COVID treatment.  Discussed with cardiology this morning.   Patient denies headache, no chest pain, no palpitations, no abdominal pain, no fever/chills/night sweats, no nausea/vomiting/diarrhea.  Discussed  with RN this morning, no acute concerns overnight.  Message from cardiology, Dr. Jacques Navy; following heart catheterization, heart failure service will be taking over and per RN transferring to 2H24.  Consulted PCCM to assist with cardiology management given 2H ICU is now a closed unit.  Objective: Vitals:   09/01/23 1420 09/01/23 1430 09/01/23 1440 09/01/23 1450  BP: (!) 89/76 108/77  102/77  Pulse: (!) 109 99 94 85  Resp: 14 (!) 21 (!) 21 17  Temp:      TempSrc:      SpO2: 96% 94% 96% 96%  Weight:      Height:        Intake/Output Summary (Last 24 hours) at 09/01/2023 1452 Last data filed at 09/01/2023 1200 Gross per 24 hour  Intake 938.28 ml  Output 700 ml  Net 238.28 ml   Filed Weights   08/28/23 1545 08/31/23 2025 09/01/23 0322  Weight: 84.6 kg 82.2 kg 80.1 kg    Examination:  Physical Exam: GEN: NAD, alert and oriented x 3, ill in appearance HEENT: NCAT, PERRL, EOMI, sclera clear, MMM PULM: Breath sounds diminished bilateral bases, no wheezing, normal respiratory effort without accessory muscle use, on 5 L nasal cannula with SpO2 98% at rest CV: Irregularly irregular rhythm w/o M/G/R, + JVD GI: abd soft, NTND,+ BS MSK: no peripheral edema, moves all extremities independently NEURO: CN II-XII intact, no focal deficits, sensation to light touch intact PSYCH: normal mood/affect Integumentary: Extremities, lower extremities greater than upper extremities cool to touch, no concerning rashes/lesions/wounds noted on exposed skin surfaces     Data Reviewed: I have personally reviewed following labs and imaging studies  CBC: Recent Labs  Lab 08/28/23 1623 08/29/23 0542 08/30/23 0537 08/31/23 0530 09/01/23 0438 09/01/23 1032  WBC 7.5 8.6 14.9* 17.3* 18.6* 21.6*  NEUTROABS 6.4  --   --  14.4*  --  18.2*  HGB 13.2 11.9* 14.2  14.3 12.6* 14.4  HCT 41.5 37.6* 43.4 44.7 37.4* 42.4  MCV 96.1 97.2 94.8 96.1 93.5 91.2  PLT 190 160 200 199 157 207   Basic Metabolic Panel: Recent Labs  Lab 08/29/23 0542 08/30/23 0537 08/30/23 1738 08/31/23 0530 09/01/23 1032  NA 133* 134* 131* 131* 134*  K 4.0 3.5 3.9 3.8 4.1  CL 101 99 96* 96* 97*  CO2 23 21* 21* 23 21*  GLUCOSE 92 143* 141* 118* 112*  BUN 15 15 21  29* 47*  CREATININE 0.96 1.03 1.19 1.36* 1.81*  CALCIUM 8.9 9.4 9.1 8.6* 8.6*  MG  --  1.8 1.8 2.0 2.1  PHOS  --  4.0 4.4  --   --    GFR: Estimated Creatinine Clearance: 37.5 mL/min (A) (by C-G formula based on SCr of 1.81 mg/dL (H)). Liver Function Tests: Recent Labs  Lab 08/28/23 1623 08/30/23 0537 08/30/23 1738 08/31/23 0530 09/01/23 1032  AST 21 138*  --  136* 92*  ALT 13 35  --  43 50*  ALKPHOS 60 49  --  41 31*  BILITOT 0.6 0.9  --  0.6 0.8  PROT 7.3 7.4  --  6.2* 5.9*  ALBUMIN 4.3 3.9 4.1 3.4* 3.2*   No results for input(s): "LIPASE", "AMYLASE" in the last 168 hours. No results for input(s): "AMMONIA" in the last 168 hours. Coagulation Profile: Recent Labs  Lab 08/28/23 1623  INR 1.0   Cardiac Enzymes: No results for input(s): "CKTOTAL", "CKMB", "CKMBINDEX", "TROPONINI" in the last 168 hours. BNP (last 3 results) No results for input(s): "  PROBNP" in the last 8760 hours. HbA1C: No results for input(s): "HGBA1C" in the last 72 hours. CBG: Recent Labs  Lab 08/30/23 0856  GLUCAP 133*   Lipid Profile: No results for input(s): "CHOL", "HDL", "LDLCALC", "TRIG", "CHOLHDL", "LDLDIRECT" in the last 72 hours. Thyroid Function Tests: Recent Labs    08/31/23 1301  TSH 1.760   Anemia Panel: No results for input(s): "VITAMINB12", "FOLATE", "FERRITIN", "TIBC", "IRON", "RETICCTPCT" in the last 72 hours. Sepsis Labs: Recent Labs  Lab 08/30/23 1738 08/30/23 1747 08/31/23 1938 09/01/23 1032 09/01/23 1110  PROCALCITON 0.16  --  0.27  --   --   LATICACIDVEN  --  1.8 2.1* 1.5 1.6     Recent Results (from the past 240 hours)  Blood Culture (routine x 2)     Status: None (Preliminary result)   Collection Time: 08/28/23  4:15 PM   Specimen: BLOOD  Result Value Ref Range Status   Specimen Description   Final    BLOOD LEFT ANTECUBITAL Performed at [facility], [address] [place], [place] [address]    Special Requests   Final    BOTTLES DRAWN AEROBIC AND ANAEROBIC Blood Culture results may not be optimal due to an inadequate volume of blood received in culture bottles Performed at [facility], [address] [place], [place] [address]    Culture   Final    NO GROWTH 4 DAYS Performed at [facility], [address], [place], [place] [address]    Report Status PENDING  Incomplete  Blood Culture (routine x 2)     Status: None (Preliminary result)   Collection Time: 08/28/23  4:18 PM   Specimen: BLOOD LEFT WRIST  Result Value Ref Range Status   Specimen Description   Final    BLOOD LEFT WRIST Performed at [facility], [address] [place], [place] [address]    Special Requests   Final    BOTTLES DRAWN AEROBIC AND ANAEROBIC Blood Culture results may not be optimal due to an inadequate volume of blood received in culture bottles Performed at [facility], [address] [place], [place] [address]    Culture   Final    NO GROWTH 4 DAYS Performed at [facility], [address], [place], [place] [address]    Report Status PENDING  Incomplete  Resp panel by RT-PCR (RSV, Flu A&B, Covid) Anterior Nasal Swab     Status: Abnormal   Collection Time: 08/28/23  5:04 PM   Specimen: Anterior Nasal Swab  Result Value Ref Range Status   SARS Coronavirus 2 by RT PCR POSITIVE (A) NEGATIVE Final    Comment: (NOTE) SARS-CoV-2 target nucleic acids are DETECTED.  The SARS-CoV-2 RNA is generally detectable in upper respiratory specimens during the acute phase of infection. Positive results  are indicative of the presence of the identified virus, but do not rule out bacterial infection or co-infection with other pathogens not detected by the test. Clinical correlation with patient history and other diagnostic information is necessary to determine patient infection status. The expected result is Negative.  Fact Sheet for Patients: BloggerCourse.com  Fact Sheet for Healthcare Providers: SeriousBroker.it  This test is not yet approved or cleared by the [place] FDA and  has been authorized for detection and/or diagnosis of SARS-CoV-2 by FDA under an Emergency Use Authorization (EUA).  This EUA will remain in effect (meaning this test can be used) for the duration of  the COVID-19 declaration under Section 564(b)(1) of the A ct, 21 U.S.C. section 360bbb-3(b)(1), unless the authorization is terminated or revoked sooner.     Influenza A by PCR NEGATIVE NEGATIVE Final   Influenza B by PCR NEGATIVE NEGATIVE Final    Comment: (NOTE) The Xpert Xpress SARS-CoV-2/FLU/RSV plus assay is intended as an aid in the diagnosis of influenza from Nasopharyngeal swab specimens and should not be used as a sole basis for treatment. Nasal washings and aspirates are unacceptable for Xpert Xpress SARS-CoV-2/FLU/RSV testing.  Fact Sheet for Patients: BloggerCourse.com  Fact Sheet for Healthcare Providers: SeriousBroker.it  This test is not yet approved or cleared by the [place] FDA and has been authorized for detection and/or diagnosis of SARS-CoV-2 by FDA under an Emergency Use Authorization (EUA). This EUA will remain in effect (meaning this test can be used) for the duration of the COVID-19 declaration under Section 564(b)(1) of the Act, 21 U.S.C. section 360bbb-3(b)(1), unless the authorization is terminated or revoked.     Resp Syncytial Virus by PCR NEGATIVE NEGATIVE Final     Comment: (NOTE) Fact Sheet for Patients: BloggerCourse.com  Fact Sheet for Healthcare Providers: SeriousBroker.it  This test is not yet approved or cleared by the [place] FDA and has been authorized for detection and/or diagnosis of SARS-CoV-2 by FDA under an Emergency Use Authorization (EUA). This EUA will remain in effect (meaning this test can be used) for the duration of the COVID-19 declaration under Section 564(b)(1) of the Act, 21 U.S.C. section 360bbb-3(b)(1), unless the authorization is terminated or revoked.  Performed at [facility], [address], [place], [place] [address]   MRSA Next Gen by PCR, Nasal     Status: None   Collection Time: 08/31/23  9:20 AM   Specimen: Nasal Mucosa; Nasal Swab  Result Value Ref Range Status   MRSA by PCR Next Gen NOT DETECTED NOT DETECTED Final    Comment: (NOTE) The GeneXpert MRSA Assay (FDA approved for NASAL specimens only), is one component of a comprehensive MRSA colonization surveillance program. It is not intended to diagnose MRSA infection nor to guide or monitor treatment for MRSA infections. Test performance is not FDA approved in patients less than 20 years old. Performed at [facility], [address], [place], [place] [address]          Radiology Studies: [facility] Chest Port 1 View Result Date: 09/01/2023 CLINICAL DATA:  PICC placement. EXAM: PORTABLE CHEST 1 VIEW COMPARISON:  August 31, 2023 FINDINGS: Stable cardiomediastinal silhouette. Right-sided PICC line is noted with distal tip in expected position of SVC. Left perihilar opacity is noted concerning for pneumonia or edema. Stable elevated right hemidiaphragm with minimal right basilar sub small liked assist. Bony thorax is unremarkable. IMPRESSION: Interval placement of right-sided PICC line with distal tip in expected position of SVC. Electronically Signed   By: Lupita Raider M.D.   On: 09/01/2023 10:15   CT HEAD WO CONTRAST ( ) Result Date: 08/31/2023 CLINICAL DATA:  Fall with memory loss EXAM: CT HEAD WITHOUT CONTRAST TECHNIQUE: Contiguous axial images were obtained from the base of the skull through the vertex without intravenous contrast. RADIATION DOSE REDUCTION: This exam was performed according to the departmental dose-optimization program which includes automated exposure control, adjustment of the mA and/or kV according to patient size and/or use of iterative reconstruction technique. COMPARISON:  None Available. FINDINGS: Brain: There is no mass, hemorrhage or extra-axial collection. The size and configuration of the ventricles  and extra-axial CSF spaces are normal. There is hypoattenuation of the white matter, most commonly indicating chronic small vessel disease. Vascular: No hyperdense vessel or unexpected vascular calcification. Skull: The visualized skull base, calvarium and extracranial soft tissues are normal. Sinuses/Orbits: No fluid levels or advanced mucosal thickening of the visualized paranasal sinuses. No mastoid or middle ear effusion. Normal orbits. Other: None. IMPRESSION: 1. No acute intracranial abnormality. 2. Chronic small vessel disease. Electronically Signed   By: Deatra Robinson M.D.   On: 08/31/2023 21:16   DG Chest 1 View Result Date: 08/31/2023 CLINICAL DATA:  Respiratory distress EXAM: CHEST  1 VIEW COMPARISON:  Chest x-ray 08/30/2023 FINDINGS: There stable moderate elevation of the right hemidiaphragm. There some diffuse hazy airspace opacities in the left upper lobe which have increased. The heart is enlarged, unchanged. There are atherosclerotic calcifications of the aorta. There is no pleural effusion or pneumothorax. Degenerative changes affect the left shoulder. IMPRESSION: 1. Increasing hazy airspace opacities in the left upper lobe, concerning for pneumonia. 2. Stable cardiomegaly. 3. Stable moderate elevation of the right  hemidiaphragm. Electronically Signed   By: Darliss Cheney M.D.   On: 08/31/2023 17:57   [place] EKG SITE RITE Result Date: 08/31/2023 If Site Rite image not attached, placement could not be confirmed due to current cardiac rhythm.  ECHOCARDIOGRAM COMPLETE Result Date: 08/31/2023    ECHOCARDIOGRAM REPORT   Patient Name:   Brian Ruiz Date of Exam: 08/31/2023 Medical Rec #:  [id number]     Height:       72.0 in Accession #:    [id number]    Weight:       186.6 lb Date of Birth:  [birth year]     BSA:          2.069 m Patient Age:    77 years      BP:           109/81 mmHg Patient Gender: M             HR:           142 bpm. Exam Location:  Inpatient Procedure: 2D Echo, Color Doppler, Cardiac Doppler and Intracardiac            Opacification Agent (Both Spectral and Color Flow Doppler were            utilized during procedure). Indications:    CHF-Acute Systolic  History:        Patient has prior history of Echocardiogram examinations.                 Arrythmias:Bradycardia; Signs/Symptoms:Hypotension.  Sonographer:    Lamont Snowball Referring Phys: [id number] Marinda Elk  Sonographer Comments: Technically difficult study due to poor echo windows. IMPRESSIONS  1. No apical thrombus with Definity contrast. Left ventricular ejection fraction, by estimation, is 35 to 40%. Left ventricular ejection fraction by 2D MOD biplane is 37.1 %. The left ventricle has moderately decreased function. The left ventricle demonstrates regional wall motion abnormalities (see scoring diagram/findings for description). There is severe concentric left ventricular hypertrophy. Left ventricular diastolic function could not be evaluated. There is severe hypokinesis of the left ventricular, mid-apical apical segment, anterior wall, lateral wall and inferior segment. The base of the heart is hyperdynamic. Findings suggestive of Takatsubo cardiomyopathy, although, large LAD territory ischemic could not be excluded.  2. Right ventricular systolic  function is normal. The right ventricular size is normal.  3. Left atrial size was moderately dilated.  4. The  mitral valve is grossly normal. Trivial mitral valve regurgitation.  5. The aortic valve is tricuspid. Aortic valve regurgitation is not visualized. Aortic valve sclerosis is present, with no evidence of aortic valve stenosis.  6. Aortic dilatation noted. There is borderline dilatation of the ascending aorta, measuring 39 mm.  7. Rhythm strip during this exam demonstrates atrial fibrillation and with RVR. Comparison(s): Changes from prior study are noted. 08/14/2017: LVEF 55-60%. FINDINGS  Left Ventricle: No apical thrombus with Definity contrast. Left ventricular ejection fraction, by estimation, is 35 to 40%. Left ventricular ejection fraction by 2D MOD biplane is 37.1 %. The left ventricle has moderately decreased function. The left ventricle demonstrates regional wall motion abnormalities. Severe hypokinesis of the left ventricular, mid-apical apical segment, anterior wall, lateral wall and inferior segment. The left ventricular internal cavity size was normal in size. There is severe concentric left ventricular hypertrophy. Left ventricular diastolic function could not be evaluated due to atrial fibrillation. Left ventricular diastolic function could not be evaluated.  LV Wall Scoring: The mid and distal anterior wall, mid and distal lateral wall, mid and distal anterior septum, entire apex, mid and distal inferior wall, mid anterolateral segment, and mid inferoseptal segment are hypokinetic. The base of the heart is hyperdynamic. Findings suggestive of Takatsubo cardiomyopathy, although, large LAD territory ischemic could not be excluded. Right Ventricle: The right ventricular size is normal. No increase in right ventricular wall thickness. Right ventricular systolic function is normal. Left Atrium: Left atrial size was moderately dilated. Right Atrium: Right atrial size was normal in size.  Pericardium: There is no evidence of pericardial effusion. Mitral Valve: The mitral valve is grossly normal. Trivial mitral valve regurgitation. MV peak gradient, 2.5 mmHg. The mean mitral valve gradient is 1.0 mmHg. Tricuspid Valve: The tricuspid valve is grossly normal. Tricuspid valve regurgitation is mild. Aortic Valve: The aortic valve is tricuspid. Aortic valve regurgitation is not visualized. Aortic valve sclerosis is present, with no evidence of aortic valve stenosis. Aortic valve peak gradient measures 2.8 mmHg. Pulmonic Valve: The pulmonic valve was not well visualized. Pulmonic valve regurgitation is not visualized. Aorta: Aortic dilatation noted. There is borderline dilatation of the ascending aorta, measuring 39 mm. IAS/Shunts: The interatrial septum was not well visualized. EKG: Rhythm strip during this exam demonstrates atrial fibrillation and with RVR.  LEFT VENTRICLE PLAX 2D                        Biplane EF (MOD) LVIDd:         4.00 cm         LV Biplane EF:   Left LVIDs:         3.00 cm                          ventricular LV PW:         1.70 cm                          ejection LV IVS:        1.60 cm                          fraction by LVOT diam:     2.20 cm                          2D MOD  LV SV:         40                               biplane is LV SV Index:   19                               37.1 %. LVOT Area:     3.80 cm  LV Volumes (MOD) LV vol d, MOD    67.6 ml A2C: LV vol d, MOD    110.3 ml A4C: LV vol s, MOD    41.0 ml A2C: LV vol s, MOD    69.0 ml A4C: LV SV MOD A2C:   26.5 ml LV SV MOD A4C:   110.3 ml LV SV MOD BP:    33.0 ml LEFT ATRIUM             Index        RIGHT ATRIUM           Index LA Vol (A2C):   82.9 ml 40.07 ml/m  RA Area:     15.80 cm LA Vol (A4C):   89.2 ml 43.12 ml/m  RA Volume:   38.80 ml  18.76 ml/m LA Biplane Vol: 90.7 ml 43.84 ml/m  AORTIC VALVE AV Area (Vmax): 3.92 cm AV Vmax:        84.20 cm/s AV Peak Grad:   2.8 mmHg LVOT Vmax:      86.80 cm/s LVOT Vmean:      55.800 cm/s LVOT VTI:       0.105 m  AORTA Ao Root diam: 3.80 cm Ao Asc diam:  3.90 cm MITRAL VALVE               TRICUSPID VALVE MV Area (PHT): 10.25 cm   TR Peak grad:   18.8 mmHg MV Area VTI:   4.49 cm    TR Vmax:        217.00 cm/s MV Peak grad:  2.5 mmHg MV Mean grad:  1.0 mmHg    SHUNTS MV Vmax:       0.80 m/s    Systemic VTI:  0.10 m MV Vmean:      49.0 cm/s   Systemic Diam: 2.20 cm MV Decel Time: 74 msec MV E velocity: 30.40 cm/s MV A velocity: 65.10 cm/s MV E/A ratio:  0.47 Zoila Shutter MD Electronically signed by Zoila Shutter MD Signature Date/Time: 08/31/2023/1:09:55 PM    Final         Scheduled Meds:  [VWU Hold] Chlorhexidine Gluconate Cloth  6 each Topical Daily   [MAR Hold] dexamethasone (DECADRON) injection  6 mg Intravenous Daily   [MAR Hold] nirmatrelvir/ritonavir  3 tablet Oral BID   [MAR Hold] pantoprazole  40 mg Oral Daily   [MAR Hold] sertraline  25 mg Oral TID   [MAR Hold] sodium chloride flush  10-40 mL Intracatheter Q12H   [MAR Hold] sodium chloride flush  3 mL Intravenous Q12H   Continuous Infusions:  sodium chloride 10 mL/hr at 09/01/23 1200   amiodarone 30 mg/hr (09/01/23 1200)   [MAR Hold] furosemide     heparin 1,000 Units/hr (09/01/23 1200)     LOS: 3 days    Time spent: 52 minutes spent on 09/01/2023 caring for this patient face-to-face including chart review, ordering labs/tests, documenting, discussion with nursing staff, consultants, updating family and interview/physical  exam    Alvira Philips [place], DO [facility] Available via Epic secure chat 7am-7pm After these hours, please refer to coverage provider listed on [email or web address] 09/01/2023, 2:52 PM

## 2023-09-02 DIAGNOSIS — R570 Cardiogenic shock: Secondary | ICD-10-CM | POA: Diagnosis not present

## 2023-09-02 DIAGNOSIS — I4891 Unspecified atrial fibrillation: Secondary | ICD-10-CM | POA: Diagnosis not present

## 2023-09-02 DIAGNOSIS — I5021 Acute systolic (congestive) heart failure: Secondary | ICD-10-CM | POA: Diagnosis not present

## 2023-09-02 DIAGNOSIS — I214 Non-ST elevation (NSTEMI) myocardial infarction: Secondary | ICD-10-CM | POA: Diagnosis not present

## 2023-09-02 LAB — CBC
HCT: 40.4 % (ref 39.0–52.0)
Hemoglobin: 13.8 g/dL (ref 13.0–17.0)
MCH: 31.3 pg (ref 26.0–34.0)
MCHC: 34.2 g/dL (ref 30.0–36.0)
MCV: 91.6 fL (ref 80.0–100.0)
Platelets: 207 10*3/uL (ref 150–400)
RBC: 4.41 MIL/uL (ref 4.22–5.81)
RDW: 13 % (ref 11.5–15.5)
WBC: 17.3 10*3/uL — ABNORMAL HIGH (ref 4.0–10.5)
nRBC: 0.1 % (ref 0.0–0.2)

## 2023-09-02 LAB — COMPREHENSIVE METABOLIC PANEL WITH GFR
ALT: 44 U/L (ref 0–44)
AST: 55 U/L — ABNORMAL HIGH (ref 15–41)
Albumin: 3 g/dL — ABNORMAL LOW (ref 3.5–5.0)
Alkaline Phosphatase: 32 U/L — ABNORMAL LOW (ref 38–126)
Anion gap: 16 — ABNORMAL HIGH (ref 5–15)
BUN: 57 mg/dL — ABNORMAL HIGH (ref 8–23)
CO2: 21 mmol/L — ABNORMAL LOW (ref 22–32)
Calcium: 8.4 mg/dL — ABNORMAL LOW (ref 8.9–10.3)
Chloride: 97 mmol/L — ABNORMAL LOW (ref 98–111)
Creatinine, Ser: 2.1 mg/dL — ABNORMAL HIGH (ref 0.61–1.24)
GFR, Estimated: 32 mL/min — ABNORMAL LOW (ref >60)
Glucose, Bld: 179 mg/dL — ABNORMAL HIGH (ref 70–99)
Potassium: 4.3 mmol/L (ref 3.5–5.1)
Sodium: 134 mmol/L — ABNORMAL LOW (ref 135–145)
Total Bilirubin: 0.8 mg/dL (ref 0.0–1.2)
Total Protein: 5.4 g/dL — ABNORMAL LOW (ref 6.5–8.1)

## 2023-09-02 LAB — HEPARIN LEVEL (UNFRACTIONATED)
Heparin Unfractionated: 1 [IU]/mL — ABNORMAL HIGH (ref 0.30–0.70)
Heparin Unfractionated: 0.35 [IU]/mL (ref 0.30–0.70)

## 2023-09-02 LAB — CULTURE, BLOOD (ROUTINE X 2)
Culture: NO GROWTH
Culture: NO GROWTH

## 2023-09-02 LAB — COOXEMETRY PANEL
Carboxyhemoglobin: 2.2 % — ABNORMAL HIGH (ref 0.5–1.5)
Methemoglobin: <0.7 % (ref 0.0–1.5)
O2 Saturation: 85.5 %
Total hemoglobin: 13.6 g/dL (ref 12.0–16.0)

## 2023-09-02 LAB — MAGNESIUM: Magnesium: 2.3 mg/dL (ref 1.7–2.4)

## 2023-09-02 LAB — PROCALCITONIN: Procalcitonin: 0.22 ng/mL

## 2023-09-02 LAB — LACTIC ACID, PLASMA: Lactic Acid, Venous: 1.2 mmol/L (ref 0.5–1.9)

## 2023-09-02 MED ORDER — AMIODARONE LOAD VIA INFUSION
150.0000 mg | Freq: Once | INTRAVENOUS | Status: AC
  Administered 2023-09-02: 150 mg via INTRAVENOUS

## 2023-09-02 MED ORDER — DIGOXIN 125 MCG PO TABS
125.0000 ug | ORAL_TABLET | Freq: Every day | ORAL | Status: DC
  Administered 2023-09-02: 125 ug via ORAL
  Filled 2023-09-02: qty 1

## 2023-09-02 NOTE — Plan of Care (Signed)
  Problem: Clinical Measurements: Goal: Ability to maintain clinical measurements within normal limits will improve Outcome: Progressing Goal: Diagnostic test results will improve Outcome: Progressing Goal: Cardiovascular complication will be avoided Outcome: Progressing   Problem: Activity: Goal: Ability to tolerate increased activity will improve Outcome: Progressing   Problem: Clinical Measurements: Goal: Ability to maintain a body temperature in the normal range will improve Outcome: Progressing

## 2023-09-02 NOTE — Progress Notes (Signed)
 Advanced Heart Failure Team Progress Note   Primary Physician: Financial risk analyst, [facility] Cardiologist:  Parke Poisson, MD  Reason for Consultation: Acute systolic CHF  Interval hx:  - Mixed venous 85% today - lactic acid 1.2 -   Objective:    Vital Signs:   Temp:  [97.7 F (36.5 C)-99.1 F (37.3 C)] 98.9 F (37.2 C) (04/05 0830) Pulse Rate:  [0-190] 92 (04/05 0945) Resp:  [12-43] 29 (04/05 0945) BP: (85-137)/(54-123) 89/64 (04/05 0900) SpO2:  [78 %-100 %] 97 % (04/05 0945) Last BM Date : 09/01/23  Weight change: Filed Weights   08/28/23 1545 08/31/23 2025 09/01/23 0322  Weight: 84.6 kg 82.2 kg 80.1 kg    Intake/Output:   Intake/Output Summary (Last 24 hours) at 09/02/2023 1044 Last data filed at 09/02/2023 0700 Gross per 24 hour  Intake 916.51 ml  Output 200 ml  Net 716.51 ml      Physical Exam    General:  chronically ill appearing WM Neck: JVP 7-8 Cor: tachycardic; irregular Lungs:coarse lung sounds Abdomen: soft, nontender, nondistended.  Extremities: extremities are cool Neuro: Alert and oriented X 3. Speech repetitive.   Telemetry   Afib 110-120  Labs   Basic Metabolic Panel: Recent Labs  Lab 08/30/23 0537 08/30/23 1738 08/31/23 0530 09/01/23 1032 09/01/23 1241 09/02/23 0519  NA 134* 131* 131* 134* 134*  134* 134*  K 3.5 3.9 3.8 4.1 4.0  4.0 4.3  CL 99 96* 96* 97*  --  97*  CO2 21* 21* 23 21*  --  21*  GLUCOSE 143* 141* 118* 112*  --  179*  BUN 15 21 29* 47*  --  57*  CREATININE 1.03 1.19 1.36* 1.81*  --  2.10*  CALCIUM 9.4 9.1 8.6* 8.6*  --  8.4*  MG 1.8 1.8 2.0 2.1  --  2.3  PHOS 4.0 4.4  --   --   --   --     Liver Function Tests: Recent Labs  Lab 08/28/23 1623 08/30/23 0537 08/30/23 1738 08/31/23 0530 09/01/23 1032 09/02/23 0519  AST 21 138*  --  136* 92* 55*  ALT 13 35  --  43 50* 44  ALKPHOS 60 49  --  41 31* 32*  BILITOT 0.6 0.9  --  0.6 0.8 0.8  PROT 7.3 7.4  --  6.2* 5.9* 5.4*  ALBUMIN 4.3 3.9 4.1 3.4*  3.2* 3.0*   No results for input(s): "LIPASE", "AMYLASE" in the last 168 hours. No results for input(s): "AMMONIA" in the last 168 hours.  CBC: Recent Labs  Lab 08/28/23 1623 08/29/23 0542 08/30/23 0537 08/31/23 0530 09/01/23 0438 09/01/23 1032 09/01/23 1241 09/02/23 0519  WBC 7.5   < > 14.9* 17.3* 18.6* 21.6*  --  17.3*  NEUTROABS 6.4  --   --  14.4*  --  18.2*  --   --   HGB 13.2   < > 14.2 14.3 12.6* 14.4 14.6  14.6 13.8  HCT 41.5   < > 43.4 44.7 37.4* 42.4 43.0  43.0 40.4  MCV 96.1   < > 94.8 96.1 93.5 91.2  --  91.6  PLT 190   < > 200 199 157 207  --  207   < > = values in this interval not displayed.    Cardiac Enzymes: No results for input(s): "CKTOTAL", "CKMB", "CKMBINDEX", "TROPONINI" in the last 168 hours.  BNP: BNP (last 3 results) Recent Labs    08/30/23 1738 08/31/23 0705  BNP  2,770.5* 1,951.7*    ProBNP (last 3 results) No results for input(s): "PROBNP" in the last 8760 hours.   CBG: Recent Labs  Lab 08/30/23 0856  GLUCAP 133*    Coagulation Studies: No results for input(s): "LABPROT", "INR" in the last 72 hours.   Imaging   CARDIAC CATHETERIZATION Result Date: 09/01/2023 HEMODYNAMICS: RA:       7 mmHg (mean) RV:       25/3, 6 mmHg PA:       27/15 mmHg (21 mean) PCWP: 12 mmHg (mean)    Estimated Fick CO/CI   3.07L/min, 1.52L/min/m2 Thermodilution CO/CI   2.4L/min, 1.19L/min/m2    PAPi  1.7  IMPRESSION: Right heart catheterization for worsening shock. Normal coronary angiography with significant aortic tortuosity Normal biventricular filling pressures Severely reduced cardiac output by thermodilution RECOMMENDATIONS: Patient with underlying dementia and poor functional status. Not a good candidate for MCS. Will attempt to temporize with inotropes, potential cardioversion. Family updated on critically ill status. Patient transferred to the CCU.     Medications:     Current Medications:  Chlorhexidine Gluconate Cloth  6 each Topical Daily    dexamethasone (DECADRON) injection  6 mg Intravenous Daily   nirmatrelvir/ritonavir  3 tablet Oral BID   pantoprazole  40 mg Oral Daily   sertraline  25 mg Oral TID   sodium chloride flush  10-40 mL Intracatheter Q12H   sodium chloride flush  3 mL Intravenous Q12H    Infusions:  amiodarone 30 mg/hr (09/02/23 0700)   heparin 850 Units/hr (09/02/23 0759)   milrinone 0.25 mcg/kg/min (09/02/23 0700)     Assessment/Plan   Acute systolic CHF with low output>>cardiogenic shock: -Etiology not certain. HS troponin > 5900. Echo with EF 20-25% and RWMA in LAD territory, severe LVH. Findings suggest Takotsubo CM vs large LAD territory infarct -LHC with no obstructive CAD; RHC on 09/01/23 w/ CI of 1.2-1.5 with normal filling pressures. - Started on milrinone 0.14mcg/kg/min; mixed venous 85% today? Will repeat.  - Euvolemic on exam - Transfer to floor.  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 He is not a candidate for any advanced therapies due to poor baseline functionality.  2. Type 2 MI -Reported chest pain this admission. HS troponin peaked at > 5900 as above.  -Potentially ACS vs Takotsubo -Cath as above -Heparin gtt, 81 mg aspirin daily - stable  3. Atrial fibrillation with RVR -hep gtt -increase amio gtt to 60mg /hr -DCCV Monday if he does not convert.  4. COVID-19 infection Acute respiratory failure  -Continue Paxlovid for 5 day course -Supplemental O2 as needed  5. AKI -Scr baseline around 1 -sCr 2.1 today likely from ATN. I suspect this will improve  6. Elevated LFTs -Improving  7. Chronic pain syndrome -On dilaudid 4 mg po q 8 hrs PRN  8. Memory impairment -? Dementia -Notes declining memory over several years. Watch for delirium.  Length of Stay: 4  Tom Macpherson, DO  09/02/2023, 10:44 AM  Advanced Heart Failure Team Pager [phone] (M-F; 7a - 5p)  Please contact [facility] for night-coverage after hours (4p -7a ) and  weekends on [email or web address]

## 2023-09-02 NOTE — Progress Notes (Signed)
 PHARMACY - ANTICOAGULATION CONSULT NOTE  Pharmacy Consult for heparin Indication: chest pain/ACS and atrial fibrillation  Allergies  Allergen Reactions   Nsaids Shortness Of Breath    Other reaction(s): Bronchospasm (ALLERGY/intolerance)   Oxycodone Other (See Comments)    Agitation. Per the any pain medication that has codeine in it causes that reaction. Other reaction(s): Other Agitation. Per the any pain medication that has codeine in it causes that reaction.   Tolmetin Shortness Of Breath   Tramadol Anaphylaxis   Fentanyl Other (See Comments)   Codeine Anxiety    Other reaction(s): Bronchospasm (ALLERGY/intolerance), Other (See Comments) Irritable     Patient Measurements: Height: 6' (182.9 cm) Weight: 80.1 kg (176 lb 9.4 oz) IBW/kg (Calculated) : 77.6 HEPARIN DW (KG): 82.2  Vital Signs: Temp: 97.8 F (36.6 C) (04/05 1100) Temp Source: Axillary (04/05 1100) BP: 112/66 (04/05 1800) Pulse Rate: 148 (04/05 1800)  Labs: Recent Labs    08/31/23 0530 08/31/23 0705 08/31/23 1056 08/31/23 1301 08/31/23 1802 09/01/23 0438 09/01/23 1032 09/01/23 1241 09/02/23 0519 09/02/23 1818  HGB 14.3  --   --   --   --  12.6* 14.4 14.6  14.6 13.8  --   HCT 44.7  --   --   --   --  37.4* 42.4 43.0  43.0 40.4  --   PLT 199  --   --   --   --  157 207  --  207  --   HEPARINUNFRC  --   --   --   --    < > 0.47  --   --  1.00* 0.35  CREATININE 1.36*  --   --   --   --   --  1.81*  --  2.10*  --   TROPONINIHS  --  5,919* 5,340* 4,534*  --   --   --   --   --   --    < > = values in this interval not displayed.    Estimated Creatinine Clearance: 32.3 mL/min (A) (by C-G formula based on SCr of 2.1 mg/dL (H)).     Medications:  Medications Prior to Admission  Medication Sig Dispense Refill Last Dose/Taking   acetaminophen (TYLENOL) 500 MG tablet Take 1,000 mg by mouth every 8 (eight) hours as needed for mild pain (pain score 1-3) or moderate pain (pain score 4-6). For pain.    Past Month   Artificial Tear Ointment (DRY EYES OP) Apply 1 drop to eye daily as needed (dry eyes).   Past Month   carvedilol (COREG) 12.5 MG tablet Take 1 tablet by mouth 2 (two) times daily.   Past Month   gabapentin (NEURONTIN) 600 MG tablet Take 600 mg by mouth 2 (two) times daily.   Past Month   HYDROmorphone (DILAUDID) 4 MG tablet Take 1 tablet by mouth every 8 (eight) hours.   Past Month   omeprazole (PRILOSEC) 40 MG capsule Take 40 mg by mouth daily.   Past Month   senna-docusate (SENOKOT-S) 8.6-50 MG tablet Take 2 tablets by mouth 2 (two) times daily as needed for moderate constipation. 30 tablet 0 Past Month   sertraline (ZOLOFT) 25 MG tablet Take 75 mg by mouth daily.   Past Month    Assessment: 78 YO male presenting with COVID-19. On 4/3 AM, patient developed Afib with RVR, T wave abnormalities and began complaining of chest pain. Patient not on [facility] PTA but did receive a dose of Lovenox 40mg  on 4/2 @  6578. Pharmacy consulted for heparin dosing.  Heparin level is within goal range at 0.35 this evening. CBC is stable and no signs of bleeding.   Goal of Therapy:  Heparin level 0.3-0.7 units/ml Monitor platelets by anticoagulation protocol: Yes   Plan:  Continue IV heparin at 850 units/hr. Monitor daily heparin level and CBC Monitor for s/sx of worsening bleeding/bruising  Reece Leader, Colon Flattery, BCCP Clinical Pharmacist  09/02/2023 7:13 PM   [facility] pharmacy phone numbers are listed on [email or web address]

## 2023-09-02 NOTE — Progress Notes (Signed)
 [facility] signoff note   The care of this patient has been transferred from the [facility] service to the following service:  Service: Cardiology/Advanced HF team Attending: Dr. Gasper Lloyd. I have discussed with Dr. Cheri Fowler Date & time: 09/02/2023, 9:39 AM  I have not seen this patient.  Reviewed the chart.  Discussed with Dr. Merrily Pew who advised that PCCM would continue to provide IM support while patient is still in [facility] and they will reach back out to [facility] when it is appropriate for [facility] to take over care.  [facility] will sign off at this time.  Please re consult the [facility] team for any further assistance.  Thank you   [employer]. MD Richmond Campbell, [facility], [facility]   [facility] & Physician Advisor [place]     To contact a [facility] provider, please log into the web site [email or web address] and access using universal [place] password for that web site. If you do not have the password, please call the hospital operator. Page "[facility] Admits & Consults" listed on top of the page. Provide a number where you can be directly reached.

## 2023-09-02 NOTE — Progress Notes (Signed)
 PHARMACY - ANTICOAGULATION CONSULT NOTE  Pharmacy Consult for heparin Indication: chest pain/ACS and atrial fibrillation  Allergies  Allergen Reactions   Nsaids Shortness Of Breath    Other reaction(s): Bronchospasm (ALLERGY/intolerance)   Oxycodone Other (See Comments)    Agitation. Per the any pain medication that has codeine in it causes that reaction. Other reaction(s): Other Agitation. Per the any pain medication that has codeine in it causes that reaction.   Tolmetin Shortness Of Breath   Tramadol Anaphylaxis   Fentanyl Other (See Comments)   Codeine Anxiety    Other reaction(s): Bronchospasm (ALLERGY/intolerance), Other (See Comments) Irritable     Patient Measurements: Height: 6' (182.9 cm) Weight: 80.1 kg (176 lb 9.4 oz) IBW/kg (Calculated) : 77.6 HEPARIN DW (KG): 82.2  Vital Signs: Temp: 98.3 F (36.8 C) (04/05 0335) Temp Source: Oral (04/05 0335) BP: 110/54 (04/05 0400) Pulse Rate: 78 (04/05 0400)  Labs: Recent Labs    08/30/23 1738 08/31/23 0530 08/31/23 0530 08/31/23 0705 08/31/23 1056 08/31/23 1301 08/31/23 1802 09/01/23 0438 09/01/23 1032 09/01/23 1241  HGB  --  14.3   < >  --   --   --   --  12.6* 14.4 14.6  14.6  HCT  --  44.7   < >  --   --   --   --  37.4* 42.4 43.0  43.0  PLT  --  199  --   --   --   --   --  157 207  --   HEPARINUNFRC  --   --   --   --   --   --  0.96* 0.47  --   --   CREATININE 1.19 1.36*  --   --   --   --   --   --  1.81*  --   TROPONINIHS  --   --   --  5,919* 5,340* 4,534*  --   --   --   --    < > = values in this interval not displayed.    Estimated Creatinine Clearance: 37.5 mL/min (A) (by C-G formula based on SCr of 1.81 mg/dL (H)).     Medications:  Medications Prior to Admission  Medication Sig Dispense Refill Last Dose/Taking   acetaminophen (TYLENOL) 500 MG tablet Take 1,000 mg by mouth every 8 (eight) hours as needed for mild pain (pain score 1-3) or moderate pain (pain score 4-6). For pain.   Past  Month   Artificial Tear Ointment (DRY EYES OP) Apply 1 drop to eye daily as needed (dry eyes).   Past Month   carvedilol (COREG) 12.5 MG tablet Take 1 tablet by mouth 2 (two) times daily.   Past Month   gabapentin (NEURONTIN) 600 MG tablet Take 600 mg by mouth 2 (two) times daily.   Past Month   HYDROmorphone (DILAUDID) 4 MG tablet Take 1 tablet by mouth every 8 (eight) hours.   Past Month   omeprazole (PRILOSEC) 40 MG capsule Take 40 mg by mouth daily.   Past Month   senna-docusate (SENOKOT-S) 8.6-50 MG tablet Take 2 tablets by mouth 2 (two) times daily as needed for moderate constipation. 30 tablet 0 Past Month   sertraline (ZOLOFT) 25 MG tablet Take 75 mg by mouth daily.   Past Month    Assessment: 78 YO male presenting with COVID-19. On 4/3 AM, patient developed Afib with RVR, T wave abnormalities and began complaining of chest pain. Patient not on [facility] PTA but  did receive a dose of Lovenox 40mg  on 4/2 @0958 . Pharmacy consulted for heparin dosing.  HL is supratherapeutic at 1.0 on UFH IV 1000 units/hr. CBC is stable and no signs of bleeding. Moderate bruising around TR band site that is unchanged over night.   Goal of Therapy:  Heparin level 0.3-0.7 units/ml Monitor platelets by anticoagulation protocol: Yes   Plan:  Reduce UFH infusion to 850 units/hr Check 8-hour heparin level Monitor daily heprain level and CBC Monitor for s/sx of worsening bleeding/bruising  Wilmer Floor, PharmD PGY2 Cardiology Pharmacy Resident 09/02/2023 5:44 AM

## 2023-09-02 NOTE — Plan of Care (Signed)
  Problem: Education: Goal: Knowledge of risk factors and measures for prevention of condition will improve Outcome: Progressing   Problem: Coping: Goal: Psychosocial and spiritual needs will be supported Outcome: Progressing   Problem: Respiratory: Goal: Will maintain a patent airway Outcome: Progressing Goal: Complications related to the disease process, condition or treatment will be avoided or minimized Outcome: Progressing   Problem: Education: Goal: Knowledge of General Education information will improve Description: Including pain rating scale, medication(s)/side effects and non-pharmacologic comfort measures Outcome: Progressing   Problem: Health Behavior/Discharge Planning: Goal: Ability to manage health-related needs will improve Outcome: Progressing   Problem: Clinical Measurements: Goal: Ability to maintain clinical measurements within normal limits will improve Outcome: Progressing Goal: Will remain free from infection Outcome: Progressing Goal: Diagnostic test results will improve Outcome: Progressing Goal: Respiratory complications will improve Outcome: Progressing Goal: Cardiovascular complication will be avoided Outcome: Progressing   Problem: Activity: Goal: Risk for activity intolerance will decrease Outcome: Progressing   Problem: Nutrition: Goal: Adequate nutrition will be maintained Outcome: Progressing   Problem: Coping: Goal: Level of anxiety will decrease Outcome: Progressing   Problem: Elimination: Goal: Will not experience complications related to bowel motility Outcome: Progressing Goal: Will not experience complications related to urinary retention Outcome: Progressing   Problem: Pain Managment: Goal: General experience of comfort will improve and/or be controlled Outcome: Progressing   Problem: Safety: Goal: Ability to remain free from injury will improve Outcome: Progressing   Problem: Skin Integrity: Goal: Risk for impaired  skin integrity will decrease Outcome: Progressing   Problem: Activity: Goal: Ability to tolerate increased activity will improve Outcome: Progressing   Problem: Clinical Measurements: Goal: Ability to maintain a body temperature in the normal range will improve Outcome: Progressing   Problem: Respiratory: Goal: Ability to maintain adequate ventilation will improve Outcome: Progressing Goal: Ability to maintain a clear airway will improve Outcome: Progressing   Problem: Education: Goal: Understanding of CV disease, CV risk reduction, and recovery process will improve Outcome: Progressing Goal: Individualized Educational Video(s) Outcome: Progressing   Problem: Activity: Goal: Ability to return to baseline activity level will improve Outcome: Progressing   Problem: Cardiovascular: Goal: Ability to achieve and maintain adequate cardiovascular perfusion will improve Outcome: Progressing Goal: Vascular access site(s) Level 0-1 will be maintained Outcome: Progressing   Problem: Health Behavior/Discharge Planning: Goal: Ability to safely manage health-related needs after discharge will improve Outcome: Progressing

## 2023-09-03 DIAGNOSIS — R570 Cardiogenic shock: Secondary | ICD-10-CM | POA: Diagnosis not present

## 2023-09-03 DIAGNOSIS — I4891 Unspecified atrial fibrillation: Secondary | ICD-10-CM | POA: Diagnosis not present

## 2023-09-03 DIAGNOSIS — J9601 Acute respiratory failure with hypoxia: Secondary | ICD-10-CM | POA: Diagnosis not present

## 2023-09-03 DIAGNOSIS — I5021 Acute systolic (congestive) heart failure: Secondary | ICD-10-CM | POA: Diagnosis not present

## 2023-09-03 DIAGNOSIS — I214 Non-ST elevation (NSTEMI) myocardial infarction: Secondary | ICD-10-CM | POA: Diagnosis not present

## 2023-09-03 LAB — BASIC METABOLIC PANEL WITH GFR
Anion gap: 14 (ref 5–15)
BUN: 74 mg/dL — ABNORMAL HIGH (ref 8–23)
CO2: 21 mmol/L — ABNORMAL LOW (ref 22–32)
Calcium: 8.2 mg/dL — ABNORMAL LOW (ref 8.9–10.3)
Chloride: 95 mmol/L — ABNORMAL LOW (ref 98–111)
Creatinine, Ser: 2.91 mg/dL — ABNORMAL HIGH (ref 0.61–1.24)
GFR, Estimated: 22 mL/min — ABNORMAL LOW (ref >60)
Glucose, Bld: 172 mg/dL — ABNORMAL HIGH (ref 70–99)
Potassium: 3.6 mmol/L (ref 3.5–5.1)
Sodium: 130 mmol/L — ABNORMAL LOW (ref 135–145)

## 2023-09-03 LAB — COOXEMETRY PANEL
Carboxyhemoglobin: 1.1 % (ref 0.5–1.5)
Carboxyhemoglobin: 1.2 % (ref 0.5–1.5)
Methemoglobin: <0.7 % (ref 0.0–1.5)
Methemoglobin: 0.9 % (ref 0.0–1.5)
O2 Saturation: 81.8 %
O2 Saturation: 74.4 %
Total hemoglobin: 12.4 g/dL (ref 12.0–16.0)
Total hemoglobin: 12.7 g/dL (ref 12.0–16.0)

## 2023-09-03 LAB — HEPARIN LEVEL (UNFRACTIONATED)
Heparin Unfractionated: 0.15 [IU]/mL — ABNORMAL LOW (ref 0.30–0.70)
Heparin Unfractionated: 0.1 [IU]/mL — ABNORMAL LOW (ref 0.30–0.70)
Heparin Unfractionated: 0.46 [IU]/mL (ref 0.30–0.70)

## 2023-09-03 LAB — MAGNESIUM: Magnesium: 2.2 mg/dL (ref 1.7–2.4)

## 2023-09-03 LAB — DIGOXIN LEVEL: Digoxin Level: <0.2 ng/mL — ABNORMAL LOW (ref 0.8–2.0)

## 2023-09-03 MED ORDER — POTASSIUM CHLORIDE CRYS ER 20 MEQ PO TBCR
20.0000 meq | EXTENDED_RELEASE_TABLET | Freq: Once | ORAL | Status: AC
  Administered 2023-09-03: 20 meq via ORAL
  Filled 2023-09-03: qty 1

## 2023-09-03 MED ORDER — SODIUM CHLORIDE 0.9 % IV SOLN: INTRAVENOUS | Status: AC

## 2023-09-03 MED ORDER — DIGOXIN 125 MCG PO TABS: 125.0000 ug | ORAL_TABLET | Freq: Every day | ORAL | Status: DC

## 2023-09-03 NOTE — Plan of Care (Signed)
   Problem: Education: Goal: Knowledge of risk factors and measures for prevention of condition will improve Outcome: Progressing   Problem: Coping: Goal: Psychosocial and spiritual needs will be supported Outcome: Progressing   Problem: Respiratory: Goal: Will maintain a patent airway Outcome: Progressing   Problem: Education: Goal: Knowledge of General Education information will improve Description: Including pain rating scale, medication(s)/side effects and non-pharmacologic comfort measures Outcome: Progressing

## 2023-09-03 NOTE — Progress Notes (Signed)
 PHARMACY - ANTICOAGULATION CONSULT NOTE  Pharmacy Consult for heparin Indication: chest pain/ACS and atrial fibrillation  Allergies  Allergen Reactions   Nsaids Shortness Of Breath    Other reaction(s): Bronchospasm (ALLERGY/intolerance)   Oxycodone Other (See Comments)    Agitation. Per the any pain medication that has codeine in it causes that reaction. Other reaction(s): Other Agitation. Per the any pain medication that has codeine in it causes that reaction.   Tolmetin Shortness Of Breath   Tramadol Anaphylaxis   Fentanyl Other (See Comments)   Codeine Anxiety    Other reaction(s): Bronchospasm (ALLERGY/intolerance), Other (See Comments) Irritable     Patient Measurements: Height: 6' (182.9 cm) Weight: 82.5 kg (181 lb 14.1 oz) IBW/kg (Calculated) : 77.6 HEPARIN DW (KG): 82.2  Vital Signs: Temp: 97.8 F (36.6 C) (04/06 0346) Temp Source: Oral (04/06 0346) BP: 103/60 (04/06 0346) Pulse Rate: 77 (04/06 0346)  Labs: Recent Labs    08/31/23 0705 08/31/23 1056 08/31/23 1301 08/31/23 1802 09/01/23 0438 09/01/23 1032 09/01/23 1241 09/02/23 0519 09/02/23 1818  HGB  --   --   --    < > 12.6* 14.4 14.6  14.6 13.8  --   HCT  --   --   --    < > 37.4* 42.4 43.0  43.0 40.4  --   PLT  --   --   --   --  157 207  --  207  --   HEPARINUNFRC  --   --   --    < > 0.47  --   --  1.00* 0.35  CREATININE  --   --   --   --   --  1.81*  --  2.10*  --   TROPONINIHS 5,919* 5,340* 4,534*  --   --   --   --   --   --    < > = values in this interval not displayed.    Estimated Creatinine Clearance: 32.3 mL/min (A) (by C-G formula based on SCr of 2.1 mg/dL (H)).     Medications:  Medications Prior to Admission  Medication Sig Dispense Refill Last Dose/Taking   acetaminophen (TYLENOL) 500 MG tablet Take 1,000 mg by mouth every 8 (eight) hours as needed for mild pain (pain score 1-3) or moderate pain (pain score 4-6). For pain.   Past Month   Artificial Tear Ointment (DRY EYES  OP) Apply 1 drop to eye daily as needed (dry eyes).   Past Month   carvedilol (COREG) 12.5 MG tablet Take 1 tablet by mouth 2 (two) times daily.   Past Month   gabapentin (NEURONTIN) 600 MG tablet Take 600 mg by mouth 2 (two) times daily.   Past Month   HYDROmorphone (DILAUDID) 4 MG tablet Take 1 tablet by mouth every 8 (eight) hours.   Past Month   omeprazole (PRILOSEC) 40 MG capsule Take 40 mg by mouth daily.   Past Month   senna-docusate (SENOKOT-S) 8.6-50 MG tablet Take 2 tablets by mouth 2 (two) times daily as needed for moderate constipation. 30 tablet 0 Past Month   sertraline (ZOLOFT) 25 MG tablet Take 75 mg by mouth daily.   Past Month    Assessment: 78 YO male presenting with COVID-19. On 4/3 AM, patient developed Afib with RVR, T wave abnormalities and began complaining of chest pain. Patient not on [facility] PTA but did receive a dose of Lovenox 40mg  on 4/2 @0958 . Pharmacy consulted for heparin dosing.  Heparin level is  below therapeutic range at 0.15 (repeat 0.10) this morning. CBC is stable and no signs of bleeding.   Goal of Therapy:  Heparin level 0.3-0.7 units/ml Monitor platelets by anticoagulation protocol: Yes   Plan:  Increase IV heparin to 1000 units/hr. Monitor daily heparin level and CBC Monitor for s/sx of worsening bleeding/bruising  Wilmer Floor, PharmD PGY2 Cardiology Pharmacy Resident  09/03/2023 6:02 AM   [facility] pharmacy phone numbers are listed on [email or web address]

## 2023-09-03 NOTE — Progress Notes (Signed)
 Advanced Heart Failure Team Progress Note   Primary Physician: [facility] Cardiologist:  Parke Poisson, MD  Reason for Consultation: Acute systolic CHF  Interval hx:  - Mixed venous 75% - Feels well; no complaints.   Objective:    Vital Signs:   Temp:  [97.6 F (36.4 C)-98.1 F (36.7 C)] 97.6 F (36.4 C) (04/06 0812) Pulse Rate:  [76-148] 81 (04/06 1000) Resp:  [14-25] 17 (04/06 1000) BP: (73-127)/(59-85) (P) 112/70 (04/06 1138) SpO2:  [85 %-100 %] (P) 96 % (04/06 1138) Weight:  [82.5 kg] 82.5 kg (04/06 0346) Last BM Date : 09/01/23  Weight change: Filed Weights   08/31/23 2025 09/01/23 0322 09/03/23 0346  Weight: 82.2 kg 80.1 kg 82.5 kg    Intake/Output:   Intake/Output Summary (Last 24 hours) at 09/03/2023 1428 Last data filed at 09/03/2023 0814 Gross per 24 hour  Intake 720 ml  Output 1500 ml  Net -780 ml      Physical Exam    General:  chronically ill appearing Neck: JVP 5-6 Cor: tachycardic; irregular Lungs:coarse lung sounds Abdomen: soft, nontender, nondistended.  Extremities: extremities are cool Neuro: Alert and oriented X 3. Speech repetitive.   Telemetry   Afib 110-120  Labs   Basic Metabolic Panel: Recent Labs  Lab 08/30/23 0537 08/30/23 1738 08/31/23 0530 09/01/23 1032 09/01/23 1241 09/02/23 0519 09/03/23 0657  NA 134* 131* 131* 134* 134*  134* 134* 130*  K 3.5 3.9 3.8 4.1 4.0  4.0 4.3 3.6  CL 99 96* 96* 97*  --  97* 95*  CO2 21* 21* 23 21*  --  21* 21*  GLUCOSE 143* 141* 118* 112*  --  179* 172*  BUN 15 21 29* 47*  --  57* 74*  CREATININE 1.03 1.19 1.36* 1.81*  --  2.10* 2.91*  CALCIUM 9.4 9.1 8.6* 8.6*  --  8.4* 8.2*  MG 1.8 1.8 2.0 2.1  --  2.3 2.2  PHOS 4.0 4.4  --   --   --   --   --     Liver Function Tests: Recent Labs  Lab 08/28/23 1623 08/30/23 0537 08/30/23 1738 08/31/23 0530 09/01/23 1032 09/02/23 0519  AST 21 138*  --  136* 92* 55*  ALT 13 35  --  43 50* 44  ALKPHOS 60 49  --  41  31* 32*  BILITOT 0.6 0.9  --  0.6 0.8 0.8  PROT 7.3 7.4  --  6.2* 5.9* 5.4*  ALBUMIN 4.3 3.9 4.1 3.4* 3.2* 3.0*   No results for input(s): "LIPASE", "AMYLASE" in the last 168 hours. No results for input(s): "AMMONIA" in the last 168 hours.  CBC: Recent Labs  Lab 08/28/23 1623 08/29/23 0542 08/30/23 0537 08/31/23 0530 09/01/23 0438 09/01/23 1032 09/01/23 1241 09/02/23 0519  WBC 7.5   < > 14.9* 17.3* 18.6* 21.6*  --  17.3*  NEUTROABS 6.4  --   --  14.4*  --  18.2*  --   --   HGB 13.2   < > 14.2 14.3 12.6* 14.4 14.6  14.6 13.8  HCT 41.5   < > 43.4 44.7 37.4* 42.4 43.0  43.0 40.4  MCV 96.1   < > 94.8 96.1 93.5 91.2  --  91.6  PLT 190   < > 200 199 157 207  --  207   < > = values in this interval not displayed.    Cardiac Enzymes: No results for input(s): "CKTOTAL", "CKMB", "CKMBINDEX", "TROPONINI"  in the last 168 hours.  BNP: BNP (last 3 results) Recent Labs    08/30/23 1738 08/31/23 0705  BNP 2,770.5* 1,951.7*    ProBNP (last 3 results) No results for input(s): "PROBNP" in the last 8760 hours.   CBG: Recent Labs  Lab 08/30/23 0856  GLUCAP 133*    Coagulation Studies: No results for input(s): "LABPROT", "INR" in the last 72 hours.   Imaging   No results found.    Medications:     Current Medications:  Chlorhexidine Gluconate Cloth  6 each Topical Daily   dexamethasone (DECADRON) injection  6 mg Intravenous Daily   nirmatrelvir/ritonavir  3 tablet Oral BID   pantoprazole  40 mg Oral Daily   sertraline  25 mg Oral TID   sodium chloride flush  10-40 mL Intracatheter Q12H   sodium chloride flush  3 mL Intravenous Q12H    Infusions:  amiodarone 60 mg/hr (09/03/23 1141)   heparin 1,000 Units/hr (09/03/23 1142)   milrinone 0.25 mcg/kg/min (09/03/23 1305)     Assessment/Plan   Acute systolic CHF with low output>>cardiogenic shock: -Etiology not certain. HS troponin > 5900. Echo with EF 20-25% and RWMA in LAD territory, severe LVH. Findings  suggest Takotsubo CM vs large LAD territory infarct -LHC with no obstructive CAD; RHC on 09/01/23 w/ CI of 1.2-1.5 with normal filling pressures. - Hypovolemic on exam with rising sCr; IVF bolus today.  - Mixed venous >70% on repeat checks. Will decrease milrinone to 0.11mcg/kg/min.  - Repeat limited TTE later this week to re-assess LV function.   2. Type 2 MI -Reported chest pain this admission. HS troponin peaked at > 5900 as above.  -Potentially ACS vs Takotsubo -Cath as above -Heparin gtt, 81 mg aspirin daily - satble  3. Atrial fibrillation with RVR -hep gtt -continue amio gtt to 60mg /hr -DCCV Monday if he does not convert.  4. COVID-19 infection Acute respiratory failure  -Continue Paxlovid for 5 day course -Supplemental O2 as needed  5. AKI -Scr baseline around 1 -sCr 2.9  6. Elevated LFTs -Improving  7. Chronic pain syndrome -On dilaudid 4 mg po q 8 hrs PRN  8. Memory impairment -? Dementia -Notes declining memory over several years. Watch for delirium.   I spoke to Mr. Zinni son today. His son drove down from [place] to see him. Mr. Spratlin and his wife currently live independently, however, need increasing levels of support. We discussed goals of care. Will try to wean off milrinone over the next 24-48H with repeat limited TTE. He is currently receiving palliative care at home. He may need to be discharged with palliative inotropes vs hospice depending on ability to tolerate home inotropes vs GOC.   Length of Stay: 5  Ransome Helwig, DO  09/03/2023, 2:28 PM  Advanced Heart Failure Team Pager [phone] (M-F; 7a - 5p)  Please contact [facility] for night-coverage after hours (4p -7a ) and weekends on [email or web address]

## 2023-09-03 NOTE — Progress Notes (Signed)
 PHARMACY - ANTICOAGULATION CONSULT NOTE  Pharmacy Consult for heparin Indication: chest pain/ACS and atrial fibrillation  Allergies  Allergen Reactions   Nsaids Shortness Of Breath    Other reaction(s): Bronchospasm (ALLERGY/intolerance)   Oxycodone Other (See Comments)    Agitation. Per the any pain medication that has codeine in it causes that reaction. Other reaction(s): Other Agitation. Per the any pain medication that has codeine in it causes that reaction.   Tolmetin Shortness Of Breath   Tramadol Anaphylaxis   Fentanyl Other (See Comments)   Codeine Anxiety    Other reaction(s): Bronchospasm (ALLERGY/intolerance), Other (See Comments) Irritable     Patient Measurements: Height: 6' (182.9 cm) Weight: 82.5 kg (181 lb 14.1 oz) IBW/kg (Calculated) : 77.6 HEPARIN DW (KG): 82.2  Vital Signs: Temp: 97.7 F (36.5 C) (04/06 1649) Temp Source: Oral (04/06 1649) BP: 107/75 (04/06 2000) Pulse Rate: 92 (04/06 2000)  Labs: Recent Labs    09/01/23 0438 09/01/23 1032 09/01/23 1241 09/02/23 0519 09/02/23 1818 09/03/23 0657 09/03/23 0658 09/03/23 0804 09/03/23 2014  HGB 12.6* 14.4 14.6  14.6 13.8  --   --   --   --   --   HCT 37.4* 42.4 43.0  43.0 40.4  --   --   --   --   --   PLT 157 207  --  207  --   --   --   --   --   HEPARINUNFRC 0.47  --   --  1.00*   < >  --  0.15* 0.10* 0.46  CREATININE  --  1.81*  --  2.10*  --  2.91*  --   --   --    < > = values in this interval not displayed.    Estimated Creatinine Clearance: 23.3 mL/min (A) (by C-G formula based on SCr of 2.91 mg/dL (H)).     Medications:  Medications Prior to Admission  Medication Sig Dispense Refill Last Dose/Taking   acetaminophen (TYLENOL) 500 MG tablet Take 1,000 mg by mouth every 8 (eight) hours as needed for mild pain (pain score 1-3) or moderate pain (pain score 4-6). For pain.   Past Month   Artificial Tear Ointment (DRY EYES OP) Apply 1 drop to eye daily as needed (dry eyes).   Past  Month   carvedilol (COREG) 12.5 MG tablet Take 1 tablet by mouth 2 (two) times daily.   Past Month   gabapentin (NEURONTIN) 600 MG tablet Take 600 mg by mouth 2 (two) times daily.   Past Month   HYDROmorphone (DILAUDID) 4 MG tablet Take 1 tablet by mouth every 8 (eight) hours.   Past Month   omeprazole (PRILOSEC) 40 MG capsule Take 40 mg by mouth daily.   Past Month   senna-docusate (SENOKOT-S) 8.6-50 MG tablet Take 2 tablets by mouth 2 (two) times daily as needed for moderate constipation. 30 tablet 0 Past Month   sertraline (ZOLOFT) 25 MG tablet Take 75 mg by mouth daily.   Past Month    Assessment: 78 YO male presenting with COVID-19. On 4/3 AM, patient developed Afib with RVR, T wave abnormalities and began complaining of chest pain. Patient not on [facility] PTA but did receive a dose of Lovenox 40mg  on 4/2 @0958 . Pharmacy consulted for heparin dosing.  Heparin level is in therapeutic range at 0.46 on heparin drip rate 1000 uts/hr. CBC is stable and no signs of bleeding.   Goal of Therapy:  Heparin level 0.3-0.7 units/ml Monitor platelets  by anticoagulation protocol: Yes   Plan:  Continue IV heparin  1000 units/hr. Monitor daily heparin level and CBC Monitor for s/sx of bleeding  Leota Sauers Pharm.D. CPP, BCPS Clinical Pharmacist [phone] 09/03/2023 8:40 PM  [facility] pharmacy phone numbers are listed on [email or web address]

## 2023-09-03 NOTE — Progress Notes (Signed)
 Progress Note   Patient: Brian Ruiz WUJ:[id number] DOB: [birth year] DOA: 08/28/2023     5 DOS: the patient was seen and examined on 09/03/2023   Brief hospital course: AREK SPADAFORE is a 78 y.o. male with past medical history significant for HTN, depression/anxiety, chronic pain syndrome with opioid dependence who presented to [facility] on 3/31 with complaints of generalized weakness and shortness of breath.  Upon evaluation in the emergency department patient was found to be positive for COVID-19 via PCR.  Patient was additionally found to be hypoxic requiring initiation of supplemental oxygen.  TRH consulted for admission.   Significant Hospital events: 3/31: Admit to [facility], COVID viral pneumonia 4/3: A-fib with RVR, elevated troponin 5919 w/ concern for NSTEMI; started on amiodarone/heparin drip; transfer to [facility] 4/4: s/p Cardiac catheterization, transferring to 2H  Assessment and Plan:  Acute metabolic encephalopathy - Multifactorial etiology.  Appears to be resolved.  Patient alert and oriented.  Acute hypoxic respiratory failure - Factorial etiology given COVID and HFrEF.  Appears to be showing improvement.  Currently down to 4 L nasal cannula.  Will attempt to wean O2 as tolerated.  COVID-19 pneumonia - COVID-positive PCR on presentation.  Receiving Paxlovid (day 5 of 5), Decadron, supplemental O2.  Wean O2 as above.  NSTEMI - Chest discomfort morning of 4/3 with concurrent rapid A-fib RVR.  Initial troponin greater than 5900.  Echo noting EF 35-40% severe LV hypokinesis concerning for large LAD ischemia or toxic dose.  Cardiology following closely.  Cath performed 4/4.  Continues on heparin drip.    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  Continues on IV Lasix.  Monitor urine output recheck BMP in AM.  Status post left/right heart cath.  Milrinone on board.  Acute kidney injury - Likely secondary to cardiorenal etiology.   Creatinine continues to trend upwards.  Creatinine now 2.91.  Continue to monitor urine output.  Repeat BMP and mag in AM.  Atrial fibrillation with RVR - Showed improvement with amiodarone drip.  digoxin p.o.  Starting today.  Rate appears improved.  Shock liver with transaminitis - Very mild.  LFTs trending downward.  Chronic pain - Patient takes opioids at baseline.  Continue current care.     Subjective: Patient resting comfortably this morning.  States he feels a little better.  Down to 4 L nasal cannula.  Admits to having copious urine output.  Denies any worsening shortness of breath, cough, fever, chills, chest pain, nausea, vomiting, abdominal pain.  Physical Exam: Vitals:   09/02/23 2304 09/03/23 0200 09/03/23 0346 09/03/23 0812  BP: 95/70 111/85 103/60 98/66  Pulse: 92 77 77 90  Resp: 14 (!) 21 20 (!) 25  Temp: 98.1 F (36.7 C)  97.8 F (36.6 C) 97.6 F (36.4 C)  TempSrc: Oral  Oral Oral  SpO2: 91% 96% 94% 94%  Weight:   82.5 kg   Height:       GENERAL:  Alert, pleasant, no acute distress  HEENT:  EOMI, nasal cannula CARDIOVASCULAR: Irregularly irregular RESPIRATORY: Poor air movement bilaterally GASTROINTESTINAL:  Soft, nontender, nondistended EXTREMITIES:  No LE edema bilaterally NEURO:  No new focal deficits appreciated SKIN:  No rashes noted PSYCH:  Appropriate mood and affect   Data Reviewed:  There are no new results to review at this time.  Family Communication: None at bedside  Disposition: Status is: Inpatient Remains inpatient appropriate because: Acute hypoxic respiratory failure  Planned Discharge Destination: Rehab  Time spent: 46 minutes  Author: Deanna Artis, DO 09/03/2023 10:04 AM  For on call review [email or web address].

## 2023-09-04 ENCOUNTER — Inpatient Hospital Stay (HOSPITAL_COMMUNITY): Admitting: Registered Nurse

## 2023-09-04 ENCOUNTER — Telehealth (HOSPITAL_COMMUNITY): Payer: Self-pay | Admitting: Pharmacy Technician

## 2023-09-04 ENCOUNTER — Encounter (HOSPITAL_COMMUNITY): Payer: Self-pay | Admitting: Cardiology

## 2023-09-04 ENCOUNTER — Inpatient Hospital Stay (HOSPITAL_COMMUNITY)

## 2023-09-04 ENCOUNTER — Encounter (HOSPITAL_COMMUNITY)
Admission: EM | Disposition: A | Discharge status: Skilled Nursing Facility | Payer: Self-pay | Source: Home / Self Care | Attending: Cardiology

## 2023-09-04 ENCOUNTER — Other Ambulatory Visit (HOSPITAL_COMMUNITY): Payer: Self-pay

## 2023-09-04 DIAGNOSIS — I5043 Acute on chronic combined systolic (congestive) and diastolic (congestive) heart failure: Secondary | ICD-10-CM | POA: Diagnosis not present

## 2023-09-04 DIAGNOSIS — I4891 Unspecified atrial fibrillation: Secondary | ICD-10-CM | POA: Diagnosis not present

## 2023-09-04 DIAGNOSIS — I340 Nonrheumatic mitral (valve) insufficiency: Secondary | ICD-10-CM

## 2023-09-04 DIAGNOSIS — J9601 Acute respiratory failure with hypoxia: Secondary | ICD-10-CM | POA: Diagnosis not present

## 2023-09-04 LAB — MAGNESIUM: Magnesium: 2.2 mg/dL (ref 1.7–2.4)

## 2023-09-04 LAB — BASIC METABOLIC PANEL WITH GFR
Anion gap: 11 (ref 5–15)
BUN: 69 mg/dL — ABNORMAL HIGH (ref 8–23)
CO2: 22 mmol/L (ref 22–32)
Calcium: 8 mg/dL — ABNORMAL LOW (ref 8.9–10.3)
Chloride: 96 mmol/L — ABNORMAL LOW (ref 98–111)
Creatinine, Ser: 2.39 mg/dL — ABNORMAL HIGH (ref 0.61–1.24)
GFR, Estimated: 27 mL/min — ABNORMAL LOW (ref >60)
Glucose, Bld: 104 mg/dL — ABNORMAL HIGH (ref 70–99)
Potassium: 4.2 mmol/L (ref 3.5–5.1)
Sodium: 129 mmol/L — ABNORMAL LOW (ref 135–145)

## 2023-09-04 LAB — COOXEMETRY PANEL
Carboxyhemoglobin: 1.6 % — ABNORMAL HIGH (ref 0.5–1.5)
Carboxyhemoglobin: 1.6 % — ABNORMAL HIGH (ref 0.5–1.5)
Methemoglobin: <0.7 % (ref 0.0–1.5)
Methemoglobin: <0.7 % (ref 0.0–1.5)
O2 Saturation: 81.8 %
O2 Saturation: 63.8 %
Total hemoglobin: 12.2 g/dL (ref 12.0–16.0)
Total hemoglobin: 12.5 g/dL (ref 12.0–16.0)

## 2023-09-04 LAB — HEPARIN LEVEL (UNFRACTIONATED)
Heparin Unfractionated: 0.8 [IU]/mL — ABNORMAL HIGH (ref 0.30–0.70)
Heparin Unfractionated: >1.1 [IU]/mL — ABNORMAL HIGH (ref 0.30–0.70)

## 2023-09-04 SURGERY — TRANSESOPHAGEAL ECHOCARDIOGRAM (TEE) (CATHLAB)
Anesthesia: General

## 2023-09-04 MED ORDER — SODIUM CHLORIDE 0.9% FLUSH: 3.0000 mL | Freq: Two times a day (BID) | INTRAVENOUS | Status: DC

## 2023-09-04 MED ORDER — PROPOFOL 10 MG/ML IV BOLUS
INTRAVENOUS | Status: DC | PRN
  Administered 2023-09-04: 30 mg via INTRAVENOUS
  Administered 2023-09-04: 150 mg via INTRAVENOUS

## 2023-09-04 MED ORDER — SODIUM CHLORIDE 0.9% FLUSH: 3.0000 mL | INTRAVENOUS | Status: DC | PRN

## 2023-09-04 MED ORDER — HEPARIN (PORCINE) 25000 UT/250ML-% IV SOLN
700.0000 [IU]/h | INTRAVENOUS | Status: DC
  Administered 2023-09-04 – 2023-09-05 (×2): 700 [IU]/h via INTRAVENOUS
  Filled 2023-09-04: qty 250

## 2023-09-04 MED ORDER — SODIUM CHLORIDE 0.9 % IV SOLN: INTRAVENOUS | Status: DC | PRN

## 2023-09-04 MED ORDER — SUCCINYLCHOLINE CHLORIDE 200 MG/10ML IV SOSY
PREFILLED_SYRINGE | INTRAVENOUS | Status: DC | PRN
  Administered 2023-09-04: 100 mg via INTRAVENOUS

## 2023-09-04 MED ORDER — LIDOCAINE 2% (20 MG/ML) 5 ML SYRINGE
INTRAMUSCULAR | Status: DC | PRN
  Administered 2023-09-04: 60 mg via INTRAVENOUS

## 2023-09-04 MED ORDER — PHENYLEPHRINE 80 MCG/ML (10ML) SYRINGE FOR IV PUSH (FOR BLOOD PRESSURE SUPPORT)
PREFILLED_SYRINGE | INTRAVENOUS | Status: DC | PRN
Start: 2023-09-04 — End: 2023-09-04
  Administered 2023-09-04: 160 ug via INTRAVENOUS

## 2023-09-04 MED ORDER — DAPAGLIFLOZIN PROPANEDIOL 10 MG PO TABS
10.0000 mg | ORAL_TABLET | Freq: Every day | ORAL | Status: DC
  Administered 2023-09-05 – 2023-09-08 (×4): 10 mg via ORAL
  Filled 2023-09-04 (×4): qty 1

## 2023-09-04 MED ORDER — PHENYLEPHRINE HCL-NACL 20-0.9 MG/250ML-% IV SOLN
INTRAVENOUS | Status: DC | PRN
Start: 2023-09-04 — End: 2023-09-04
  Administered 2023-09-04: 40 ug/min via INTRAVENOUS

## 2023-09-04 MED ORDER — HYDROMORPHONE HCL 2 MG PO TABS
4.0000 mg | ORAL_TABLET | Freq: Three times a day (TID) | ORAL | Status: DC
  Administered 2023-09-04 – 2023-09-08 (×13): 4 mg via ORAL
  Filled 2023-09-04 (×13): qty 2

## 2023-09-04 MED FILL — Heparin Sodium (Porcine) Inj 1000 Unit/ML: INTRAMUSCULAR | Qty: 10 | Status: AC

## 2023-09-04 SURGICAL SUPPLY — 1 items: PAD DEFIB RADIO PHYSIO CONN (PAD) ×1 IMPLANT

## 2023-09-04 NOTE — Progress Notes (Signed)
 PT Cancellation Note  Patient Details Name: Brian Ruiz MRN: [id number] DOB: 10/23/1945   Cancelled Treatment:    Reason Eval/Treat Not Completed: Patient at procedure or test/unavailable. Pt off floor undergoing cardioversion. Acute PT to return as able to progress mobility.  Lewis Shock, PT, DPT Acute Rehabilitation Services Secure chat preferred Office #: [phone]    Iona Hansen 09/04/2023, 1:47 PM

## 2023-09-04 NOTE — Anesthesia Preprocedure Evaluation (Signed)
 Anesthesia Evaluation  Patient identified by MRN, date of birth, ID band Patient awake    Reviewed: Allergy & Precautions, H&P , NPO status , Patient's Chart, lab work & pertinent test results  Airway Mallampati: II   Neck ROM: full    Dental   Pulmonary neg pulmonary ROS   breath sounds clear to auscultation       Cardiovascular hypertension, + Past MI and +CHF  + dysrhythmias Atrial Fibrillation  Rhythm:regular Rate:Normal     Neuro/Psych  PSYCHIATRIC DISORDERS Anxiety Depression     Neuromuscular disease    GI/Hepatic   Endo/Other    Renal/GU      Musculoskeletal  (+) Arthritis ,    Abdominal   Peds  Hematology   Anesthesia Other Findings   Reproductive/Obstetrics                             Anesthesia Physical Anesthesia Plan  ASA: 3  Anesthesia Plan: General   Post-op Pain Management:    Induction: Intravenous  PONV Risk Score and Plan: 2 and Treatment may vary due to age or medical condition, Dexamethasone and Ondansetron  Airway Management Planned: Oral ETT  Additional Equipment:   Intra-op Plan:   Post-operative Plan: Extubation in OR  Informed Consent: I have reviewed the patients History and Physical, chart, labs and discussed the procedure including the risks, benefits and alternatives for the proposed anesthesia with the patient or authorized representative who has indicated his/her understanding and acceptance.     Dental advisory given  Plan Discussed with: CRNA, Anesthesiologist and Surgeon  Anesthesia Plan Comments:        Anesthesia Quick Evaluation

## 2023-09-04 NOTE — Progress Notes (Signed)
 Progress Note   Patient: Brian Ruiz:[id number] DOB: 06/02/1945 DOA: 08/28/2023     6 DOS: the patient was seen and examined on 09/04/2023   Brief hospital course: Brian Ruiz is a 78 y.o. male with past medical history significant for HTN, depression/anxiety, chronic pain syndrome with opioid dependence who presented to [facility] on 3/31 with complaints of generalized weakness and shortness of breath.  Upon evaluation in the emergency department patient was found to be positive for COVID-19 via PCR.  Patient was additionally found to be hypoxic requiring initiation of supplemental oxygen.  TRH consulted for admission.   Significant Hospital events: 3/31: Admit to [facility], COVID viral pneumonia 4/3: A-fib with RVR, elevated troponin 5919 w/ concern for NSTEMI; started on amiodarone/heparin drip; transfer to [facility] 4/4: s/p Cardiac catheterization, transferring to 2H  Assessment and Plan:  Acute metabolic encephalopathy - Multifactorial etiology.  Appears to be resolved.  Patient alert and oriented.  Acute hypoxic respiratory failure - Factorial etiology given COVID and HFrEF.  Appears to be showing improvement.  Currently down to 4 L nasal cannula.  Will attempt to wean O2 as tolerated.  COVID-19 pneumonia - COVID-positive PCR on presentation.  Completed 5 days of Paxlovid.  Continue Decadron, supplemental O2.  Wean O2 as above.  NSTEMI - Chest discomfort morning of 4/3 with concurrent rapid A-fib RVR.  Initial troponin greater than 5900.  Echo noting EF 35-40% severe LV hypokinesis concerning for large LAD ischemia or toxic dose.  Cardiology following closely.  Cath performed 4/4.  Continues on heparin drip.    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  Reduced EF on initial presentation.  Repeat echo pending.  Cardiac output tenuous.  Milrinone decreased yesterday.  Mild improvement in kidney function this morning.  Continues on IV Lasix.   Monitor urine output recheck BMP in AM.    Acute kidney injury - Likely secondary to cardiorenal etiology.  Creatinine trended upwards.  Creatinine this morning slightly improved from 2.91 down to 2.39.  Continue to monitor urine output.  Repeat BMP and mag in AM.  Atrial fibrillation with RVR - Showing improvement with amiodarone drip.  digoxin p.o. initiated 4/6.  Rate appears improved.  Possibility of DCCV given he is still in A-fib.  Discussion afterwards about p.o. rhythm agents.  Shock liver with transaminitis - Very mild.  LFTs trending downward.  Chronic pain - Patient takes opioids at baseline.  Continue current care.  Goals of care - Had discussion with patient and his wife this morning.  Stated that his heart function is very poor however showed mild improvement in kidney function this morning.  Repeating echo to reassess his EF.  Currently receiving palliative care at home.  It is possible that he may be discharged with palliative inotropes or even hospice.  All this depends on where his baseline lies.     Subjective: Patient resting comfortably this morning.  Patient's wife is in the room as well.  States he feels a little better.  Denies any worsening shortness of breath, cough, fever, chills, chest pain, nausea, vomiting, abdominal pain.  Physical Exam: Vitals:   09/03/23 2000 09/03/23 2253 09/04/23 0400 09/04/23 0814  BP: 107/75 105/82 113/75 106/79  Pulse: 92 83 84 88  Resp: 19 15 17 19   Temp: 97.6 F (36.4 C) 98 F (36.7 C) 98.1 F (36.7 C) 98 F (36.7 C)  TempSrc: Oral Oral Oral Oral  SpO2: 93% 98% 95%  95%  Weight:   83.6 kg   Height:       GENERAL:  Alert, pleasant, no acute distress  HEENT:  EOMI, nasal cannula CARDIOVASCULAR: Irregularly irregular RESPIRATORY: Poor air movement bilaterally GASTROINTESTINAL:  Soft, nontender, nondistended EXTREMITIES:  trace LE edema bilaterally NEURO:  No new focal deficits appreciated SKIN:  No rashes noted PSYCH:   Appropriate mood and affect   Data Reviewed:  There are no new results to review at this time.  Family Communication: None at bedside  Disposition: Status is: Inpatient Remains inpatient appropriate because: Acute hypoxic respiratory failure  Planned Discharge Destination: Rehab    Time spent: 40 minutes  Author: Deanna Artis, DO 09/04/2023 10:51 AM  For on call review [email or web address].

## 2023-09-04 NOTE — Plan of Care (Signed)
  Problem: Clinical Measurements: Goal: Will remain free from infection Outcome: Progressing   Problem: Activity: Goal: Risk for activity intolerance will decrease Outcome: Progressing   Problem: Nutrition: Goal: Adequate nutrition will be maintained Outcome: Progressing   Problem: Coping: Goal: Level of anxiety will decrease Outcome: Progressing   Problem: Elimination: Goal: Will not experience complications related to bowel motility Outcome: Progressing Goal: Will not experience complications related to urinary retention Outcome: Progressing   Problem: Pain Managment: Goal: General experience of comfort will improve and/or be controlled Outcome: Progressing   Problem: Safety: Goal: Ability to remain free from injury will improve Outcome: Progressing   Problem: Skin Integrity: Goal: Risk for impaired skin integrity will decrease Outcome: Progressing

## 2023-09-04 NOTE — Procedures (Addendum)
 Electrical Cardioversion Procedure Note Brian Ruiz [id number] Apr 22, 1946  Procedure: Electrical Cardioversion Indications:  Atrial Fibrillation  Procedure Details Consent: Risks of procedure as well as the alternatives and risks of each were explained to the (patient/caregiver).  Consent for procedure obtained. Time Out: Verified patient identification, verified procedure, site/side was marked, verified correct patient position, special equipment/implants available, medications/allergies/relevent history reviewed, required imaging and test results available.  Performed  Patient placed on cardiac monitor, pulse oximetry, supplemental oxygen as necessary.  Sedation given:  Propofol per anesthesiology Pacer pads placed anterior and posterior chest.  Cardioverted 1 time(s).  Cardioverted at 360J.  Evaluation Findings: Post procedure EKG shows: NSR Complications: None Patient did tolerate procedure well.   Brian Ruiz 09/04/2023, 2:37 PM

## 2023-09-04 NOTE — Interval H&P Note (Signed)
 History and Physical Interval Note:  09/04/2023 2:11 PM  Brian Ruiz  has presented today for surgery, with the diagnosis of afib.  The various methods of treatment have been discussed with the patient and family. After consideration of risks, benefits and other options for treatment, the patient has consented to  Procedure(s): TRANSESOPHAGEAL ECHOCARDIOGRAM (N/A) CARDIOVERSION (N/A) as a surgical intervention.  The patient's history has been reviewed, patient examined, no change in status, stable for surgery.  I have reviewed the patient's chart and labs.  Questions were answered to the patient's satisfaction.     Kenard Morawski [employer]

## 2023-09-04 NOTE — Plan of Care (Signed)
  Problem: Education: Goal: Knowledge of risk factors and measures for prevention of condition will improve Outcome: Progressing   Problem: Pain Managment: Goal: General experience of comfort will improve and/or be controlled Outcome: Progressing

## 2023-09-04 NOTE — TOC Initial Note (Signed)
 Transition of Care [facility]) - Initial/Assessment Note    Patient Details  Name: Brian Ruiz MRN: [id number] Date of Birth: 02/10/1946  Transition of Care [facility]) CM/SW Contact:    Elliot Cousin, RN Phone Number: [phone] 09/04/2023, 12:13 PM  Clinical Narrative:                 TOC CM spoke to pt's wife. Pt active with [facility] for [facility]. Will need HHRN, PT, OT orders with F2F. Has RW, bsc and cane at home. Wife is requesting PTAR for transport home. States pt is unable to get in car or home without assistance. His PCP, Dr Darleene Cleaver with [facility], NP does house call once per month.   Contacted [facility] rep, Kandee Keen to make aware.   Expected Discharge Plan: Home w Home Health Services Barriers to Discharge: Continued Medical Work up   Patient Goals and CMS Choice Patient states their goals for this hospitalization and ongoing recovery are:: To return home CMS Medicare.gov Compare Post Acute Care list provided to:: Patient Represenative (must comment) Choice offered to / list presented to : Spouse      Expected Discharge Plan and Services In-house Referral: Clinical Social Work Discharge Planning Services: CM Consult Post Acute Care Choice: Home Health Living arrangements for the past 2 months: Single Family Home                 DME Arranged: N/A DME Agency: NA       HH Arranged: PT, OT HH Agency: [facility] Date [facility] Agency Contacted: 09/04/23 Time HH Agency Contacted: 1212 Representative spoke with at [facility] Agency: Kandee Keen  Prior Living Arrangements/Services Living arrangements for the past 2 months: Single Family Home Lives with:: Spouse Patient language and need for interpreter reviewed:: Yes Do you feel safe going back to the place where you live?: Yes      Need for Family Participation in Patient Care: Yes (Comment) Care giver support system in place?: Yes (comment) Current home services: DME ([facility] (2 wheels);[facility] single point,  [facility]) Criminal Activity/Legal Involvement Pertinent to Current Situation/Hospitalization: No - Comment as needed  Activities of Daily Living   ADL Screening (condition at time of admission) Independently performs ADLs?: Yes (appropriate for developmental age) Is the patient deaf or have difficulty hearing?: Yes Does the patient have difficulty seeing, even when wearing glasses/contacts?: No Does the patient have difficulty concentrating, remembering, or making decisions?: No  Permission Sought/Granted Permission sought to share information with : Case Manager, Family Supports, PCP Permission granted to share information with : Yes, Verbal Permission Granted  Share Information with NAME: Jaleen Finch  Permission granted to share info w AGENCY: Home Health  Permission granted to share info w Relationship: wife  Permission granted to share info w Contact Information: [phone]  Emotional Assessment Appearance:: Appears stated age Attitude/Demeanor/Rapport: Engaged Affect (typically observed): Accepting Orientation: : Oriented to Self, Oriented to Place, Oriented to Situation, Oriented to  Time (Oriented however, mildly confused) Alcohol / Substance Use: Not Applicable Psych Involvement: No (comment)  Admission diagnosis:  Hypoxia [R09.02] Acute respiratory failure with hypoxia (HCC) [J96.01] COVID [U07.1] COVID-19 [U07.1] Patient Active Problem List   Diagnosis Date Noted   NSTEMI (non-ST elevated myocardial infarction) (HCC) 08/31/2023   Acute metabolic encephalopathy 08/30/2023   Acute respiratory failure with hypoxia (HCC) 08/28/2023   COVID-19 virus infection 08/28/2023   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   Depression with anxiety 09/12/2021   Palliative  care encounter 08/03/2021   Physical debility 08/03/2021   Severe protein-calorie malnutrition (HCC) 08/03/2021   Acute on chronic combined systolic and diastolic CHF (congestive heart failure)  (HCC) 05/16/2018   Chronic fatigue 06/14/2017   Palpitations 06/14/2017   Aortic ectasia (HCC) 11/15/2016   Age-related osteoporosis without current pathological fracture 10/27/2016   Screening for AAA (abdominal aortic aneurysm) 10/27/2016   Screening for prostate cancer 10/27/2016   Need for hepatitis C screening test 10/27/2016   Encounter for general adult medical examination without abnormal findings 11/26/2013   Screening for osteoporosis 11/26/2013   Kyphoscoliosis deformity of spine 07/04/2013   Enlarged prostate with lower urinary tract symptoms (LUTS) 04/12/2013   Screen for colon cancer 04/12/2013   Undiagnosed cardiac murmurs 04/12/2013   Backache 04/12/2013   White coat syndrome with hypertension 04/12/2013   Lumbar spinal stenosis 10/29/2012   DDD (degenerative disc disease), lumbosacral 03/16/2012   Lumbosacral radiculopathy 03/16/2012   Generalized anxiety disorder 03/14/2012   Renal pain 12/16/2011   Sacroiliitis, not elsewhere classified (HCC) 08/11/2011   Sacroiliac joint pain 07/27/2011   Bilateral hip pain 07/07/2011   Chronic pain syndrome 07/07/2011   Spondylolisthesis of lumbosacral region 07/07/2011   Hereditary and idiopathic peripheral neuropathy 05/27/2011   Accidental drug overdose 05/15/2011   Guilty feelings 05/15/2011   Essential hypertension 05/15/2011   Hypotension 05/15/2011   Bradycardia 05/15/2011   Chronic back pain 05/15/2011   Vitamin D deficiency 03/08/2011   PCP:  [facility] Pharmacy:   [facility] [phone] [place], [place] - [address] AT [facility] OF [address] & [facility] [address] [place] [address] Phone: [phone] Fax: [phone]  [place] [facility] [address] [place] [address] Phone: [phone] Fax: [phone]     Social Drivers of Health (SDOH) Social History: SDOH Screenings   Food Insecurity: No Food Insecurity (08/28/2023)   Housing: Low Risk  (08/28/2023)  Transportation Needs: No Transportation Needs (08/28/2023)  Utilities: Not At Risk (08/28/2023)  Financial Resource Strain: Low Risk  (02/08/2021)   Received from [facility], [facility]  Physical Activity: Inactive (02/08/2021)   Received from [facility], [facility]  Social Connections: Socially Integrated (08/28/2023)  Stress: No Stress Concern Present (02/08/2021)   Received from [facility], [facility]  Tobacco Use: Low Risk  (08/28/2023)   SDOH Interventions:     Readmission Risk Interventions    08/30/2023   12:30 PM  Readmission Risk Prevention Plan  Post Dischage Appt Complete  Medication Screening Complete  Transportation Screening Complete

## 2023-09-04 NOTE — H&P (View-Only) (Signed)
 Advanced Heart Failure Team Progress Note   Primary Physician: [facility] Cardiologist:  Parke Poisson, MD  Reason for Consultation: Acute systolic CHF Interval hx:   Coox 82%. sCr 2.9>2.39  Feeling well this morning. Wife at bedside. Breathing is good today. No CP or palpitations.   Objective:    Vital Signs:   Temp:  [97.6 F (36.4 C)-98.1 F (36.7 C)] 98 F (36.7 C) (04/07 1112) Pulse Rate:  [83-104] 88 (04/07 0814) Resp:  [15-27] 27 (04/07 1112) BP: (105-117)/(70-85) 110/85 (04/07 1112) SpO2:  [93 %-98 %] 97 % (04/07 1112) Weight:  [83.6 kg] 83.6 kg (04/07 0400) Last BM Date : 09/01/23  Weight change: Filed Weights   09/01/23 0322 09/03/23 0346 09/04/23 0400  Weight: 80.1 kg 82.5 kg 83.6 kg   Intake/Output:  Intake/Output Summary (Last 24 hours) at 09/04/2023 1121 Last data filed at 09/04/2023 0417 Gross per 24 hour  Intake 1803.52 ml  Output 850 ml  Net 953.52 ml    Physical Exam    General: Frail appearing. No distress on [place] Cardiac: JVP flat. S1 and S2 present. No murmurs or rub. Extremities: Warm and dry.  Trace BLE edema.  Neuro: Alert and oriented x3. Affect pleasant. Moves all extremities without difficulty. Lines/Devices:  RUE PICC  Telemetry   AF in 80s (personally reviewed)  Labs   Basic Metabolic Panel: Recent Labs  Lab 08/30/23 0537 08/30/23 1738 08/31/23 0530 09/01/23 1032 09/01/23 1241 09/02/23 0519 09/03/23 0657 09/04/23 0627  NA 134* 131* 131* 134* 134*  134* 134* 130* 129*  K 3.5 3.9 3.8 4.1 4.0  4.0 4.3 3.6 4.2  CL 99 96* 96* 97*  --  97* 95* 96*  CO2 21* 21* 23 21*  --  21* 21* 22  GLUCOSE 143* 141* 118* 112*  --  179* 172* 104*  BUN 15 21 29* 47*  --  57* 74* 69*  CREATININE 1.03 1.19 1.36* 1.81*  --  2.10* 2.91* 2.39*  CALCIUM 9.4 9.1 8.6* 8.6*  --  8.4* 8.2* 8.0*  MG 1.8 1.8 2.0 2.1  --  2.3 2.2 2.2  PHOS 4.0 4.4  --   --   --   --   --   --     Liver Function Tests: Recent Labs  Lab  08/28/23 1623 08/30/23 0537 08/30/23 1738 08/31/23 0530 09/01/23 1032 09/02/23 0519  AST 21 138*  --  136* 92* 55*  ALT 13 35  --  43 50* 44  ALKPHOS 60 49  --  41 31* 32*  BILITOT 0.6 0.9  --  0.6 0.8 0.8  PROT 7.3 7.4  --  6.2* 5.9* 5.4*  ALBUMIN 4.3 3.9 4.1 3.4* 3.2* 3.0*   No results for input(s): "LIPASE", "AMYLASE" in the last 168 hours. No results for input(s): "AMMONIA" in the last 168 hours.  CBC: Recent Labs  Lab 08/28/23 1623 08/29/23 0542 08/30/23 0537 08/31/23 0530 09/01/23 0438 09/01/23 1032 09/01/23 1241 09/02/23 0519  WBC 7.5   < > 14.9* 17.3* 18.6* 21.6*  --  17.3*  NEUTROABS 6.4  --   --  14.4*  --  18.2*  --   --   HGB 13.2   < > 14.2 14.3 12.6* 14.4 14.6  14.6 13.8  HCT 41.5   < > 43.4 44.7 37.4* 42.4 43.0  43.0 40.4  MCV 96.1   < > 94.8 96.1 93.5 91.2  --  91.6  PLT 190   < >  200 199 157 207  --  207   < > = values in this interval not displayed.   BNP: BNP (last 3 results) Recent Labs    08/30/23 1738 08/31/23 0705  BNP 2,770.5* 1,951.7*   CBG: Recent Labs  Lab 08/30/23 0856  GLUCAP 133*   Imaging   No results found.  Medications:    Current Medications:  Chlorhexidine Gluconate Cloth  6 each Topical Daily   dexamethasone (DECADRON) injection  6 mg Intravenous Daily   pantoprazole  40 mg Oral Daily   sertraline  25 mg Oral TID   sodium chloride flush  10-40 mL Intracatheter Q12H   sodium chloride flush  3 mL Intravenous Q12H   Infusions:  amiodarone 60 mg/hr (09/04/23 0639)   heparin 1,000 Units/hr (09/04/23 0249)   milrinone 0.125 mcg/kg/min (09/03/23 1500)   Assessment/Plan   Acute systolic CHF with low output>>cardiogenic shock: - Etiology not certain. HS troponin > 5900. Echo with EF 20-25% and RWMA in LAD territory, severe LVH. 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 - IVF bolus given yesterday with  improvement in sCr - Coox 82%. Will stop Milrinone and repeat Coox this afternoon.   - GDMT limited by AKI - Euvolemic on exam - Repeat limited TTE later this week to re-assess LV function.   2. Type 2 MI -Reported chest pain this admission. HS troponin peaked at > 5900 as above.  -Potentially ACS vs Takotsubo -Cath as above -Heparin gtt, 81 mg aspirin daily  3. Atrial fibrillation  -rate controlled this morning in 80s -hep gtt -continue amio gtt to 60mg /hr -will discuss DCCV with Dr. Shirlee Latch  4. COVID-19 infection Acute respiratory failure  -Paxlovid for 5 day course  Complete  5. AKI -Scr baseline around 1 -sCr improved with IVF bolus 2.9>2.39. -hold diuresis  6. Elevated LFTs -Improving  7. Chronic pain syndrome -On dilaudid 4 mg po q 8 hrs PRN  8. Memory impairment -? Dementia -Notes declining memory over several years. Watch for delirium.  Goals of care have been discussed with son. Mr. Littles and his wife currently live independently, however, need increasing levels of support. Will try to wean off milrinone over the next 24-48H with repeat limited TTE. He is currently receiving palliative care at home. He may need to be discharged with palliative inotropes vs hospice depending on ability to tolerate home inotropes vs GOC.   Length of Stay: 6  [place] Lee, NP  09/04/2023, 11:21 AM  Advanced Heart Failure Team Pager [phone] (M-F; 7a - 5p)  Please contact [facility] for night-coverage after hours (4p -7a ) and weekends on [email or web address]   Patient seen with NP, I formulated the plan and agree with the above note.   He is on milrinone 0.125 today with co-ox 82%.  CVP 6.  He is on amiodarone gtt 60 mg/hr + heparin gtt with HR 80s, atrial fibrillation.   No complaints today.   General: NAD Neck: No JVD, no thyromegaly or thyroid nodule.  Lungs: Clear to auscultation bilaterally with normal respiratory effort. CV: Nondisplaced PMI.  Heart irregular S1/S2, no  S3/S4, no murmur.  No peripheral edema.    Abdomen: Soft, nontender, no hepatosplenomegaly, no distention.  Skin: Intact without lesions or rashes.  Neurologic: Alert and oriented x 3.  Psych: Normal affect. Extremities: No clubbing or cyanosis.  HEENT: Normal.   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 ?Stress (Takotsubo-type) cardiomyopathy vs tachy-mediated CMP.  Looks euvolemic with CVP 6 and co-ox 82%. Creatinine trending down, 2.39 today. GDMT limited by AKI.  - Stop milrinone.  - No Lasix today.  - Can add Farxiga 10 mg daily.  - Need to get him back into NSR, see below.   Persistent atrial fibrillation, rate 80s.  He is on amiodarone gtt 60 mg/hr and heparin gtt.  - I will arrange for TEE-guided DCCV today.  We discussed risks/benefits and he agrees to procedure.  - Stop milrinone prior to DCCV.   Marca Ancona 09/04/2023 12:41 PM

## 2023-09-04 NOTE — Transfer of Care (Signed)
 Immediate Anesthesia Transfer of Care Note  Patient: Brian Ruiz  Procedure(s) Performed: TRANSESOPHAGEAL ECHOCARDIOGRAM CARDIOVERSION  Patient Location: Cath Lab  Anesthesia Type:General  Level of Consciousness: drowsy and patient cooperative  Airway & Oxygen Therapy: Patient connected to face mask oxygen  Post-op Assessment: Report given to RN and Post -op Vital signs reviewed and stable  Post vital signs: Reviewed and stable  Last Vitals:  Vitals Value Taken Time  BP 123/74 1444  Temp    Pulse 62   Resp 12   SpO2 97     Last Pain:  Vitals:   09/04/23 1406  TempSrc:   PainSc: 0-No pain      Patients Stated Pain Goal: 3 (09/01/23 1615)  Complications: There were no known notable events for this encounter.

## 2023-09-04 NOTE — Telephone Encounter (Signed)
 Patient Product/process development scientist completed.    The patient is insured through [employer]. Patient has Medicare and is not eligible for a copay card, but may be able to apply for patient assistance or Medicare RX Payment Plan (Patient Must reach out to their plan, if eligible for payment plan), if available.    Ran test claim for Eliquis 5 mg and the current 30 day co-pay is $547.48 due to a deductible.  Ran test claim for Xarelto 20 mg and the current 30 day co-pay is $545.55 due to a deductible.  This test claim was processed through [facility]- copay amounts may vary at other pharmacies due to pharmacy/plan contracts, or as the patient moves through the different stages of their insurance plan.     Roland Earl, CPHT Pharmacy Technician III Certified Patient Advocate [facility] Patient Advocate Team Direct Number: [phone]  Fax: [phone]

## 2023-09-04 NOTE — Progress Notes (Signed)
 PHARMACY - ANTICOAGULATION CONSULT NOTE  Pharmacy Consult for heparin Indication: chest pain/ACS and atrial fibrillation  Allergies  Allergen Reactions   Nsaids Shortness Of Breath    Other reaction(s): Bronchospasm (ALLERGY/intolerance)   Oxycodone Other (See Comments)    Agitation. Per the any pain medication that has codeine in it causes that reaction. Other reaction(s): Other Agitation. Per the any pain medication that has codeine in it causes that reaction.   Tolmetin Shortness Of Breath   Tramadol Anaphylaxis   Fentanyl Other (See Comments)   Codeine Anxiety    Other reaction(s): Bronchospasm (ALLERGY/intolerance), Other (See Comments) Irritable     Patient Measurements: Height: 6' (182.9 cm) Weight: 83.6 kg (184 lb 4.9 oz) IBW/kg (Calculated) : 77.6 HEPARIN DW (KG): 82.2  Vital Signs: Temp: 98 F (36.7 C) (04/07 1700) Temp Source: Oral (04/07 1700) BP: 120/70 (04/07 1700) Pulse Rate: 62 (04/07 1547)  Labs: Recent Labs    09/02/23 0519 09/02/23 1818 09/03/23 0657 09/03/23 0658 09/03/23 2014 09/04/23 0627 09/04/23 0628 09/04/23 1812  HGB 13.8  --   --   --   --   --   --   --   HCT 40.4  --   --   --   --   --   --   --   PLT 207  --   --   --   --   --   --   --   HEPARINUNFRC 1.00*   < >  --    < > 0.46  --  0.80* >1.10*  CREATININE 2.10*  --  2.91*  --   --  2.39*  --   --    < > = values in this interval not displayed.    Estimated Creatinine Clearance: 28.4 mL/min (A) (by C-G formula based on SCr of 2.39 mg/dL (H)).     Medications:  Medications Prior to Admission  Medication Sig Dispense Refill Last Dose/Taking   acetaminophen (TYLENOL) 500 MG tablet Take 1,000 mg by mouth every 8 (eight) hours as needed for mild pain (pain score 1-3) or moderate pain (pain score 4-6). For pain.   Past Month   Artificial Tear Ointment (DRY EYES OP) Apply 1 drop to eye daily as needed (dry eyes).   Past Month   carvedilol (COREG) 12.5 MG tablet Take 1 tablet  by mouth 2 (two) times daily.   Past Month   gabapentin (NEURONTIN) 600 MG tablet Take 600 mg by mouth 2 (two) times daily.   Past Month   HYDROmorphone (DILAUDID) 4 MG tablet Take 1 tablet by mouth every 8 (eight) hours.   Past Month   omeprazole (PRILOSEC) 40 MG capsule Take 40 mg by mouth daily.   Past Month   senna-docusate (SENOKOT-S) 8.6-50 MG tablet Take 2 tablets by mouth 2 (two) times daily as needed for moderate constipation. 30 tablet 0 Past Month   sertraline (ZOLOFT) 25 MG tablet Take 75 mg by mouth daily.   Past Month    Assessment: 78 YO male presenting with COVID-19. On 4/3 AM, patient developed Afib with RVR, T wave abnormalities and began complaining of chest pain. Patient not on [facility] PTA. Pharmacy consulted for heparin dosing.  Heparin level is supra-therapeutic and trended up to >1.1 units/mL.  Lab was drawn appropriately per discussion with RN.  No bleeding reported.  Goal of Therapy:  Heparin level 0.3-0.7 units/ml Monitor platelets by anticoagulation protocol: Yes   Plan:  Hold IV heparin for 1  hr (RN aware), then resume IV heparin at 700 units/hr Monitor daily heparin level and CBC Monitor for s/sx of bleeding  Carlotta Telfair D. Laney Potash, PharmD, BCPS, BCCCP 09/04/2023, 7:05 PM

## 2023-09-04 NOTE — Progress Notes (Addendum)
 Advanced Heart Failure Team Progress Note   Primary Physician: [facility] Cardiologist:  Parke Poisson, MD  Reason for Consultation: Acute systolic CHF Interval hx:   Coox 82%. sCr 2.9>2.39  Feeling well this morning. Wife at bedside. Breathing is good today. No CP or palpitations.   Objective:    Vital Signs:   Temp:  [97.6 F (36.4 C)-98.1 F (36.7 C)] 98 F (36.7 C) (04/07 1112) Pulse Rate:  [83-104] 88 (04/07 0814) Resp:  [15-27] 27 (04/07 1112) BP: (105-117)/(70-85) 110/85 (04/07 1112) SpO2:  [93 %-98 %] 97 % (04/07 1112) Weight:  [83.6 kg] 83.6 kg (04/07 0400) Last BM Date : 09/01/23  Weight change: Filed Weights   09/01/23 0322 09/03/23 0346 09/04/23 0400  Weight: 80.1 kg 82.5 kg 83.6 kg   Intake/Output:  Intake/Output Summary (Last 24 hours) at 09/04/2023 1121 Last data filed at 09/04/2023 0417 Gross per 24 hour  Intake 1803.52 ml  Output 850 ml  Net 953.52 ml    Physical Exam    General: Frail appearing. No distress on [place] Cardiac: JVP flat. S1 and S2 present. No murmurs or rub. Extremities: Warm and dry.  Trace BLE edema.  Neuro: Alert and oriented x3. Affect pleasant. Moves all extremities without difficulty. Lines/Devices:  RUE PICC  Telemetry   AF in 80s (personally reviewed)  Labs   Basic Metabolic Panel: Recent Labs  Lab 08/30/23 0537 08/30/23 1738 08/31/23 0530 09/01/23 1032 09/01/23 1241 09/02/23 0519 09/03/23 0657 09/04/23 0627  NA 134* 131* 131* 134* 134*  134* 134* 130* 129*  K 3.5 3.9 3.8 4.1 4.0  4.0 4.3 3.6 4.2  CL 99 96* 96* 97*  --  97* 95* 96*  CO2 21* 21* 23 21*  --  21* 21* 22  GLUCOSE 143* 141* 118* 112*  --  179* 172* 104*  BUN 15 21 29* 47*  --  57* 74* 69*  CREATININE 1.03 1.19 1.36* 1.81*  --  2.10* 2.91* 2.39*  CALCIUM 9.4 9.1 8.6* 8.6*  --  8.4* 8.2* 8.0*  MG 1.8 1.8 2.0 2.1  --  2.3 2.2 2.2  PHOS 4.0 4.4  --   --   --   --   --   --     Liver Function Tests: Recent Labs  Lab  08/28/23 1623 08/30/23 0537 08/30/23 1738 08/31/23 0530 09/01/23 1032 09/02/23 0519  AST 21 138*  --  136* 92* 55*  ALT 13 35  --  43 50* 44  ALKPHOS 60 49  --  41 31* 32*  BILITOT 0.6 0.9  --  0.6 0.8 0.8  PROT 7.3 7.4  --  6.2* 5.9* 5.4*  ALBUMIN 4.3 3.9 4.1 3.4* 3.2* 3.0*   No results for input(s): "LIPASE", "AMYLASE" in the last 168 hours. No results for input(s): "AMMONIA" in the last 168 hours.  CBC: Recent Labs  Lab 08/28/23 1623 08/29/23 0542 08/30/23 0537 08/31/23 0530 09/01/23 0438 09/01/23 1032 09/01/23 1241 09/02/23 0519  WBC 7.5   < > 14.9* 17.3* 18.6* 21.6*  --  17.3*  NEUTROABS 6.4  --   --  14.4*  --  18.2*  --   --   HGB 13.2   < > 14.2 14.3 12.6* 14.4 14.6  14.6 13.8  HCT 41.5   < > 43.4 44.7 37.4* 42.4 43.0  43.0 40.4  MCV 96.1   < > 94.8 96.1 93.5 91.2  --  91.6  PLT 190   < >  200 199 157 207  --  207   < > = values in this interval not displayed.   BNP: BNP (last 3 results) Recent Labs    08/30/23 1738 08/31/23 0705  BNP 2,770.5* 1,951.7*   CBG: Recent Labs  Lab 08/30/23 0856  GLUCAP 133*   Imaging   No results found.  Medications:    Current Medications:  Chlorhexidine Gluconate Cloth  6 each Topical Daily   dexamethasone (DECADRON) injection  6 mg Intravenous Daily   pantoprazole  40 mg Oral Daily   sertraline  25 mg Oral TID   sodium chloride flush  10-40 mL Intracatheter Q12H   sodium chloride flush  3 mL Intravenous Q12H   Infusions:  amiodarone 60 mg/hr (09/04/23 0639)   heparin 1,000 Units/hr (09/04/23 0249)   milrinone 0.125 mcg/kg/min (09/03/23 1500)   Assessment/Plan   Acute systolic CHF with low output>>cardiogenic shock: - Etiology not certain. HS troponin > 5900. Echo with EF 20-25% and RWMA in LAD territory, severe LVH. 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 - IVF bolus given yesterday with  improvement in sCr - Coox 82%. Will stop Milrinone and repeat Coox this afternoon.   - GDMT limited by AKI - Euvolemic on exam - Repeat limited TTE later this week to re-assess LV function.   2. Type 2 MI -Reported chest pain this admission. HS troponin peaked at > 5900 as above.  -Potentially ACS vs Takotsubo -Cath as above -Heparin gtt, 81 mg aspirin daily  3. Atrial fibrillation  -rate controlled this morning in 80s -hep gtt -continue amio gtt to 60mg /hr -will discuss DCCV with Dr. Shirlee Latch  4. COVID-19 infection Acute respiratory failure  -Paxlovid for 5 day course  Complete  5. AKI -Scr baseline around 1 -sCr improved with IVF bolus 2.9>2.39. -hold diuresis  6. Elevated LFTs -Improving  7. Chronic pain syndrome -On dilaudid 4 mg po q 8 hrs PRN  8. Memory impairment -? Dementia -Notes declining memory over several years. Watch for delirium.  Goals of care have been discussed with son. Mr. Littles and his wife currently live independently, however, need increasing levels of support. Will try to wean off milrinone over the next 24-48H with repeat limited TTE. He is currently receiving palliative care at home. He may need to be discharged with palliative inotropes vs hospice depending on ability to tolerate home inotropes vs GOC.   Length of Stay: 6  [place] Lee, NP  09/04/2023, 11:21 AM  Advanced Heart Failure Team Pager [phone] (M-F; 7a - 5p)  Please contact [facility] for night-coverage after hours (4p -7a ) and weekends on [email or web address]   Patient seen with NP, I formulated the plan and agree with the above note.   He is on milrinone 0.125 today with co-ox 82%.  CVP 6.  He is on amiodarone gtt 60 mg/hr + heparin gtt with HR 80s, atrial fibrillation.   No complaints today.   General: NAD Neck: No JVD, no thyromegaly or thyroid nodule.  Lungs: Clear to auscultation bilaterally with normal respiratory effort. CV: Nondisplaced PMI.  Heart irregular S1/S2, no  S3/S4, no murmur.  No peripheral edema.    Abdomen: Soft, nontender, no hepatosplenomegaly, no distention.  Skin: Intact without lesions or rashes.  Neurologic: Alert and oriented x 3.  Psych: Normal affect. Extremities: No clubbing or cyanosis.  HEENT: Normal.   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 ?Stress (Takotsubo-type) cardiomyopathy vs tachy-mediated CMP.  Looks euvolemic with CVP 6 and co-ox 82%. Creatinine trending down, 2.39 today. GDMT limited by AKI.  - Stop milrinone.  - No Lasix today.  - Can add Farxiga 10 mg daily.  - Need to get him back into NSR, see below.   Persistent atrial fibrillation, rate 80s.  He is on amiodarone gtt 60 mg/hr and heparin gtt.  - I will arrange for TEE-guided DCCV today.  We discussed risks/benefits and he agrees to procedure.  - Stop milrinone prior to DCCV.   Marca Ancona 09/04/2023 12:41 PM

## 2023-09-04 NOTE — CV Procedure (Signed)
 Procedure: TEE  Sedation: Per anesthesiology  Indication: Atrial fibrillation  Findings: Please see echo section for full report.  Normal LV size with severe peri-apical hypokinesis, EF 25-30%.  Normal RV size with mildly decreased systolic function.  Mild tricuspid regurgitation.  Prolapse of the posterior mitral leaflet but only trivial mitral regurgitation.  Trileaflet aortic valve with no stenosis, moderate aortic insufficiency.  Moderate left atrial enlargement, no LA appendage thrombus.  Mild right atrial enlargement.  No PFO/ASD.  Normal caliber thoracic aorta with minimal plaque.   Impression: May proceed to DCCV.  Brian Ruiz 09/04/2023 2:36 PM

## 2023-09-04 NOTE — Progress Notes (Signed)
 PHARMACY - ANTICOAGULATION CONSULT NOTE  Pharmacy Consult for heparin Indication: chest pain/ACS and atrial fibrillation  Allergies  Allergen Reactions   Nsaids Shortness Of Breath    Other reaction(s): Bronchospasm (ALLERGY/intolerance)   Oxycodone Other (See Comments)    Agitation. Per the any pain medication that has codeine in it causes that reaction. Other reaction(s): Other Agitation. Per the any pain medication that has codeine in it causes that reaction.   Tolmetin Shortness Of Breath   Tramadol Anaphylaxis   Fentanyl Other (See Comments)   Codeine Anxiety    Other reaction(s): Bronchospasm (ALLERGY/intolerance), Other (See Comments) Irritable     Patient Measurements: Height: 6' (182.9 cm) Weight: 83.6 kg (184 lb 4.9 oz) IBW/kg (Calculated) : 77.6 HEPARIN DW (KG): 82.2  Vital Signs: Temp: 98 F (36.7 C) (04/07 0814) Temp Source: Oral (04/07 0814) BP: 106/79 (04/07 0814) Pulse Rate: 88 (04/07 0814)  Labs: Recent Labs    09/01/23 1032 09/01/23 1241 09/02/23 0519 09/02/23 1818 09/03/23 0657 09/03/23 0658 09/03/23 0804 09/03/23 2014 09/04/23 0627 09/04/23 0628  HGB 14.4 14.6  14.6 13.8  --   --   --   --   --   --   --   HCT 42.4 43.0  43.0 40.4  --   --   --   --   --   --   --   PLT 207  --  207  --   --   --   --   --   --   --   HEPARINUNFRC  --   --  1.00*   < >  --    < > 0.10* 0.46  --  0.80*  CREATININE 1.81*  --  2.10*  --  2.91*  --   --   --  2.39*  --    < > = values in this interval not displayed.    Estimated Creatinine Clearance: 28.4 mL/min (A) (by C-G formula based on SCr of 2.39 mg/dL (H)).     Medications:  Medications Prior to Admission  Medication Sig Dispense Refill Last Dose/Taking   acetaminophen (TYLENOL) 500 MG tablet Take 1,000 mg by mouth every 8 (eight) hours as needed for mild pain (pain score 1-3) or moderate pain (pain score 4-6). For pain.   Past Month   Artificial Tear Ointment (DRY EYES OP) Apply 1 drop to eye  daily as needed (dry eyes).   Past Month   carvedilol (COREG) 12.5 MG tablet Take 1 tablet by mouth 2 (two) times daily.   Past Month   gabapentin (NEURONTIN) 600 MG tablet Take 600 mg by mouth 2 (two) times daily.   Past Month   HYDROmorphone (DILAUDID) 4 MG tablet Take 1 tablet by mouth every 8 (eight) hours.   Past Month   omeprazole (PRILOSEC) 40 MG capsule Take 40 mg by mouth daily.   Past Month   senna-docusate (SENOKOT-S) 8.6-50 MG tablet Take 2 tablets by mouth 2 (two) times daily as needed for moderate constipation. 30 tablet 0 Past Month   sertraline (ZOLOFT) 25 MG tablet Take 75 mg by mouth daily.   Past Month    Assessment: 78 YO male presenting with COVID-19. On 4/3 AM, patient developed Afib with RVR, T wave abnormalities and began complaining of chest pain. Patient not on [facility] PTA. Pharmacy consulted for heparin dosing.  Heparin level is supra-therapeutic this morning at 0.8 on heparin drip rate 1000 units/hr. CBC is stable as of 4/5 and  no signs of bleeding reported. ?DCCV planned today   Goal of Therapy:  Heparin level 0.3-0.7 units/ml Monitor platelets by anticoagulation protocol: Yes   Plan:  Reduce IV heparin to 950 units/hr. Check 8 hour level/CBC Monitor daily heparin level and CBC Monitor for s/sx of bleeding   Thank you for allowing pharmacy to be a part of this patient's care.   Signe Colt, PharmD 09/04/2023 10:23 AM  **Pharmacist phone directory can be found on [email or web address] listed under [facility]**

## 2023-09-04 NOTE — Anesthesia Procedure Notes (Addendum)
 Procedure Name: Intubation Date/Time: 09/04/2023 2:16 PM  Performed by: Sharyn Dross, CRNAPre-anesthesia Checklist: Patient identified, Emergency Drugs available, Suction available and Patient being monitored Patient Re-evaluated:Patient Re-evaluated prior to induction Oxygen Delivery Method: Circle system utilized Preoxygenation: Pre-oxygenation with 100% oxygen Induction Type: IV induction Ventilation: Mask ventilation without difficulty Laryngoscope Size: Mac and 4 Grade View: Grade I Tube type: Oral Tube size: 7.5 mm Number of attempts: 1 Airway Equipment and Method: Stylet and Oral airway Placement Confirmation: ETT inserted through vocal cords under direct vision, positive ETCO2 and breath sounds checked- equal and bilateral Secured at: 23 cm Tube secured with: Tape Dental Injury: Teeth and Oropharynx as per pre-operative assessment

## 2023-09-05 ENCOUNTER — Other Ambulatory Visit (HOSPITAL_COMMUNITY): Payer: Self-pay

## 2023-09-05 DIAGNOSIS — I5043 Acute on chronic combined systolic (congestive) and diastolic (congestive) heart failure: Secondary | ICD-10-CM | POA: Diagnosis not present

## 2023-09-05 DIAGNOSIS — I4891 Unspecified atrial fibrillation: Secondary | ICD-10-CM | POA: Diagnosis not present

## 2023-09-05 DIAGNOSIS — J9601 Acute respiratory failure with hypoxia: Secondary | ICD-10-CM | POA: Diagnosis not present

## 2023-09-05 LAB — BASIC METABOLIC PANEL WITH GFR
Anion gap: 10 (ref 5–15)
BUN: 58 mg/dL — ABNORMAL HIGH (ref 8–23)
CO2: 23 mmol/L (ref 22–32)
Calcium: 8.5 mg/dL — ABNORMAL LOW (ref 8.9–10.3)
Chloride: 95 mmol/L — ABNORMAL LOW (ref 98–111)
Creatinine, Ser: 1.98 mg/dL — ABNORMAL HIGH (ref 0.61–1.24)
GFR, Estimated: 34 mL/min — ABNORMAL LOW (ref >60)
Glucose, Bld: 117 mg/dL — ABNORMAL HIGH (ref 70–99)
Potassium: 4.2 mmol/L (ref 3.5–5.1)
Sodium: 128 mmol/L — ABNORMAL LOW (ref 135–145)

## 2023-09-05 LAB — COOXEMETRY PANEL
Carboxyhemoglobin: 1.2 % (ref 0.5–1.5)
Methemoglobin: <0.7 % (ref 0.0–1.5)
O2 Saturation: 65.7 %
Total hemoglobin: 12.7 g/dL (ref 12.0–16.0)

## 2023-09-05 LAB — HEPARIN LEVEL (UNFRACTIONATED)
Heparin Unfractionated: 0.7 [IU]/mL (ref 0.30–0.70)
Heparin Unfractionated: 0.63 [IU]/mL (ref 0.30–0.70)

## 2023-09-05 LAB — MAGNESIUM: Magnesium: 2.2 mg/dL (ref 1.7–2.4)

## 2023-09-05 LAB — ECHO TEE: Est EF: 30

## 2023-09-05 MED ORDER — SENNOSIDES-DOCUSATE SODIUM 8.6-50 MG PO TABS
2.0000 | ORAL_TABLET | Freq: Two times a day (BID) | ORAL | Status: DC
  Administered 2023-09-05 – 2023-09-08 (×5): 2 via ORAL
  Filled 2023-09-05 (×6): qty 2

## 2023-09-05 MED ORDER — POLYETHYLENE GLYCOL 3350 17 G PO PACK
17.0000 g | PACK | Freq: Every day | ORAL | Status: DC
  Filled 2023-09-05: qty 1

## 2023-09-05 MED ORDER — TORSEMIDE 20 MG PO TABS
20.0000 mg | ORAL_TABLET | Freq: Every day | ORAL | Status: DC
  Administered 2023-09-05 – 2023-09-08 (×4): 20 mg via ORAL
  Filled 2023-09-05 (×4): qty 1

## 2023-09-05 MED ORDER — SPIRONOLACTONE 12.5 MG HALF TABLET
12.5000 mg | ORAL_TABLET | Freq: Every day | ORAL | Status: DC
  Administered 2023-09-05 – 2023-09-06 (×2): 12.5 mg via ORAL
  Filled 2023-09-05 (×2): qty 1

## 2023-09-05 MED ORDER — AMIODARONE HCL 200 MG PO TABS
400.0000 mg | ORAL_TABLET | Freq: Two times a day (BID) | ORAL | Status: DC
  Administered 2023-09-05 – 2023-09-08 (×7): 400 mg via ORAL
  Filled 2023-09-05 (×7): qty 2

## 2023-09-05 MED ORDER — POLYETHYLENE GLYCOL 3350 17 G PO PACK
17.0000 g | PACK | Freq: Two times a day (BID) | ORAL | Status: DC
  Administered 2023-09-06 – 2023-09-08 (×3): 17 g via ORAL
  Filled 2023-09-05 (×4): qty 1

## 2023-09-05 NOTE — Progress Notes (Addendum)
 Advanced Heart Failure Team Progress Note   Primary Physician: [facility] Cardiologist:  Parke Poisson, MD  Reason for Consultation: Acute systolic CHF Interval hx:   Coox 66%. Off milrinone. sCr 2.9>2.39>1.98  Remains in SB/NSR this morning. S/p DCCV yesterday. CVP 9  Feels fine this morning. Denies CP/SOB.   Objective:    Vital Signs:   Temp:  [98 F (36.7 C)-99.2 F (37.3 C)] 98.2 F (36.8 C) (04/08 0811) Pulse Rate:  [59-70] 70 (04/08 0811) Resp:  [14-27] 17 (04/08 0811) BP: (101-141)/(65-88) 141/88 (04/08 0811) SpO2:  [91 %-98 %] 94 % (04/08 0811) Last BM Date : 09/01/23  Weight change: Filed Weights   09/01/23 0322 09/03/23 0346 09/04/23 0400  Weight: 80.1 kg 82.5 kg 83.6 kg   Intake/Output:  Intake/Output Summary (Last 24 hours) at 09/05/2023 0916 Last data filed at 09/05/2023 0600 Gross per 24 hour  Intake 651.52 ml  Output 1700 ml  Net -1048.48 ml    Physical Exam    General:  frail / weak appearing.  No respiratory difficulty Neck: supple. JVD ~6 cm.  Cor: PMI nondisplaced. Regular rate & rhythm. No rubs, gallops or murmurs. Lungs: clear Extremities: no cyanosis, clubbing, rash, trace BLE edema. PICC RUE GU: +foley Neuro: alert & oriented x 3. Moves all 4 extremities w/o difficulty. Affect pleasant.   Telemetry   SB-NSR 50s-60s (personally reviewed)  Labs   Basic Metabolic Panel: Recent Labs  Lab 08/30/23 0537 08/30/23 1738 08/31/23 0530 09/01/23 1032 09/01/23 1241 09/02/23 0519 09/03/23 0657 09/04/23 0627 09/05/23 0500  NA 134* 131*   < > 134* 134*  134* 134* 130* 129* 128*  K 3.5 3.9   < > 4.1 4.0  4.0 4.3 3.6 4.2 4.2  CL 99 96*   < > 97*  --  97* 95* 96* 95*  CO2 21* 21*   < > 21*  --  21* 21* 22 23  GLUCOSE 143* 141*   < > 112*  --  179* 172* 104* 117*  BUN 15 21   < > 47*  --  57* 74* 69* 58*  CREATININE 1.03 1.19   < > 1.81*  --  2.10* 2.91* 2.39* 1.98*  CALCIUM 9.4 9.1   < > 8.6*  --  8.4* 8.2* 8.0* 8.5*   MG 1.8 1.8   < > 2.1  --  2.3 2.2 2.2 2.2  PHOS 4.0 4.4  --   --   --   --   --   --   --    < > = values in this interval not displayed.    Liver Function Tests: Recent Labs  Lab 08/30/23 0537 08/30/23 1738 08/31/23 0530 09/01/23 1032 09/02/23 0519  AST 138*  --  136* 92* 55*  ALT 35  --  43 50* 44  ALKPHOS 49  --  41 31* 32*  BILITOT 0.9  --  0.6 0.8 0.8  PROT 7.4  --  6.2* 5.9* 5.4*  ALBUMIN 3.9 4.1 3.4* 3.2* 3.0*   No results for input(s): "LIPASE", "AMYLASE" in the last 168 hours. No results for input(s): "AMMONIA" in the last 168 hours.  CBC: Recent Labs  Lab 08/30/23 0537 08/31/23 0530 09/01/23 0438 09/01/23 1032 09/01/23 1241 09/02/23 0519  WBC 14.9* 17.3* 18.6* 21.6*  --  17.3*  NEUTROABS  --  14.4*  --  18.2*  --   --   HGB 14.2 14.3 12.6* 14.4 14.6  14.6 13.8  HCT 43.4 44.7 37.4* 42.4 43.0  43.0 40.4  MCV 94.8 96.1 93.5 91.2  --  91.6  PLT 200 199 157 207  --  207   BNP: BNP (last 3 results) Recent Labs    08/30/23 1738 08/31/23 0705  BNP 2,770.5* 1,951.7*   CBG: Recent Labs  Lab 08/30/23 0856  GLUCAP 133*   Imaging   EP STUDY Result Date: 09/04/2023 See surgical note for result.   Medications:    Current Medications:  Chlorhexidine Gluconate Cloth  6 each Topical Daily   dapagliflozin propanediol  10 mg Oral Daily   dexamethasone (DECADRON) injection  6 mg Intravenous Daily   HYDROmorphone  4 mg Oral Q8H   pantoprazole  40 mg Oral Daily   polyethylene glycol  17 g Oral Daily   senna-docusate  2 tablet Oral BID   sertraline  25 mg Oral TID   sodium chloride flush  10-40 mL Intracatheter Q12H   sodium chloride flush  3 mL Intravenous Q12H   Infusions:  amiodarone 60 mg/hr (09/05/23 0556)   heparin 700 Units/hr (09/05/23 0559)   Assessment/Plan  Acute systolic CHF with low output>>cardiogenic shock: - Etiology not certain. HS troponin > 5900. Echo with EF 20-25% and RWMA in LAD territory, severe LVH. Findings suggest Takotsubo  CM vs large LAD territory infarct - LHC with no obstructive CAD; RHC on 09/01/23 w/ CI of 1.2-1.5 with normal filling pressures. - Received IVF bolus with improvement in SCr - Coox 66%. Stable off milrinone (off 09/04/23) - GDMT limited by AKI - Volume appears mildly elevated. Weight up 3 lbs. CVP 9. Will start lasix 20 mg daily. Start 20 lasix daily.  - Repeat limited TTE later this week to re-assess LV function.   2. Type 2 MI -Reported chest pain this admission. HS troponin peaked at > 5900 as above.  -Potentially ACS vs Takotsubo -Cath as above -Heparin gtt, 81 mg aspirin daily  3. Atrial fibrillation  -Now s/p TEE/DCCV yesterday. TEE report not finalized yet but DCCV with successful conversion to NSR.  -hep gtt for 48 hrs post DCCV -Decrease amio gtt 60> 30 mg/hr  4. COVID-19 infection Acute respiratory failure  -Paxlovid for 5 day course  Complete  5. AKI -Scr baseline around 1 -sCr improved with IVF bolus 2.9>2.39>1.98. - Starting daily PO diuretic as above. Follow response.   6. Elevated LFTs -Improving  7. Chronic pain syndrome -On dilaudid 4 mg po q 8 hrs PRN  8. Memory impairment -? Dementia -Notes declining memory over several years. Watch for delirium.  Goals of care have been discussed with son. [name] and his wife currently live independently, however, need increasing levels of support. He is currently receiving palliative care at home. He has weaned off milrinone. Palliative at discharge.   Length of Stay: 7  Brian Bleacher, NP  09/05/2023, 9:16 AM  Advanced Heart Failure Team Pager [phone] (M-F; 7a - 5p)  Please contact [facility] for night-coverage after hours (4p -7a ) and weekends on [email or web address]   Patient seen with NP, I formulated the plan and agree with the above note.   TEE-guided DCCV yesterday, he remains in NSR today.  He is on amiodarone gtt and heparin gtt.  He did note some blood in his phlegm today.   Creatinine trending down, 1.98  today.  Co-ox 66% off milrinone.  JVP 7 cm on my read.   General: NAD, frail.  Neck: No JVD, no thyromegaly or thyroid nodule.  Lungs: Clear to auscultation bilaterally with normal respiratory effort. CV: Nondisplaced PMI.  Heart regular S1/S2, no S3/S4, no murmur.  No peripheral edema.   Abdomen: Soft, nontender, no hepatosplenomegaly, no distention.  Skin: Intact without lesions or rashes.  Neurologic: Alert and oriented x 3.  Psych: Normal affect. Extremities: No clubbing or cyanosis.  HEENT: Normal.   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 ?Stress (Takotsubo-type) cardiomyopathy vs tachy-mediated CMP. TEE 4/7 showed LV EF 25-30% with apical/peri-apical akinesis, mild RV dysfunction, moderate AI. He is now back in NSR.  Looks euvolemic with CVP 7 and co-ox 66% off milrinone. Creatinine trending down, 2.39 => 1.98 today. GDMT limited by AKI.  - Start torsemide 20 mg daily.  - Continue Farxiga 10 mg daily.  - Add spironolactone 12.5 daily.  - Need to keep in NSR.    Persistent atrial fibrillation, now s/p DCCV.  He remains in NSR today.  He is on amiodarone gtt 60 mg/hr and heparin gtt.  - Transition to amiodarone 400 mg bid. - Transition from heparin gtt to Eliquis tomorrow. - Small amt hemoptysis, will get CXR PA/lateral.   Frail, limited to short distances by walker at baseline.  Needs PT evaluation.  ?Need for rehab stay prior to home.   Marca Ancona 09/05/2023 11:42 AM

## 2023-09-05 NOTE — Anesthesia Postprocedure Evaluation (Signed)
 Anesthesia Post Note  Patient: Brian Ruiz  Procedure(s) Performed: TRANSESOPHAGEAL ECHOCARDIOGRAM CARDIOVERSION     Patient location during evaluation: Cath Lab Anesthesia Type: General Level of consciousness: awake and alert Pain management: pain level controlled Vital Signs Assessment: post-procedure vital signs reviewed and stable Respiratory status: spontaneous breathing, nonlabored ventilation, respiratory function stable and patient connected to nasal cannula oxygen Cardiovascular status: blood pressure returned to baseline and stable Postop Assessment: no apparent nausea or vomiting Anesthetic complications: no   There were no known notable events for this encounter.  Last Vitals:  Vitals:   09/04/23 2339 09/05/23 0400  BP: 119/79 120/72  Pulse:    Resp: 14 (!) 21  Temp:    SpO2: 91% 93%    Last Pain:  Vitals:   09/05/23 0002  TempSrc:   PainSc: 8                  Raynell Upton S

## 2023-09-05 NOTE — Progress Notes (Signed)
 PT Cancellation Note  Patient Details Name: Brian Ruiz MRN: [id number] DOB: 04/05/1946   Cancelled Treatment:    Reason Eval/Treat Not Completed: Patient at procedure or test/unavailable  Patient off the unit.    Jerolyn Center, PT Acute Rehabilitation Services  Office [phone]   Brian Ruiz 09/05/2023, 3:11 PM

## 2023-09-05 NOTE — Progress Notes (Signed)
   [facility] Liaison Note:  Notified by [facility] manager of patient/family request for [facility] Palliative services at home after discharge.   Please call with any hospice or outpatient palliative care related questions.   Thank you for the opportunity to participate in this patient's care.   Glenna Fellows, BSN, RN, OCN [employer] [phone]

## 2023-09-05 NOTE — Progress Notes (Signed)
 PHARMACY - ANTICOAGULATION CONSULT NOTE  Pharmacy Consult for heparin Indication: chest pain/ACS and atrial fibrillation  Allergies  Allergen Reactions   Nsaids Shortness Of Breath    Other reaction(s): Bronchospasm (ALLERGY/intolerance)   Oxycodone Other (See Comments)    Agitation. Per the any pain medication that has codeine in it causes that reaction. Other reaction(s): Other Agitation. Per the any pain medication that has codeine in it causes that reaction.   Tolmetin Shortness Of Breath   Tramadol Anaphylaxis   Fentanyl Other (See Comments)   Codeine Anxiety    Other reaction(s): Bronchospasm (ALLERGY/intolerance), Other (See Comments) Irritable     Patient Measurements: Height: 6' (182.9 cm) Weight: 83.5 kg (184 lb 1.4 oz) IBW/kg (Calculated) : 77.6 HEPARIN DW (KG): 82.2  Vital Signs: Temp: 98.1 F (36.7 C) (04/08 1200) Temp Source: Oral (04/08 1200) BP: 117/74 (04/08 1200) Pulse Rate: 69 (04/08 1200)  Labs: Recent Labs    09/03/23 0657 09/03/23 0658 09/04/23 0627 09/04/23 0628 09/04/23 1812 09/05/23 0500 09/05/23 1400  HEPARINUNFRC  --    < >  --    < > >1.10* 0.70 0.63  CREATININE 2.91*  --  2.39*  --   --  1.98*  --    < > = values in this interval not displayed.    Estimated Creatinine Clearance: 34.3 mL/min (A) (by C-G formula based on SCr of 1.98 mg/dL (H)).     Medications:  Medications Prior to Admission  Medication Sig Dispense Refill Last Dose/Taking   acetaminophen (TYLENOL) 500 MG tablet Take 1,000 mg by mouth every 8 (eight) hours as needed for mild pain (pain score 1-3) or moderate pain (pain score 4-6). For pain.   Past Month   Artificial Tear Ointment (DRY EYES OP) Apply 1 drop to eye daily as needed (dry eyes).   Past Month   carvedilol (COREG) 12.5 MG tablet Take 1 tablet by mouth 2 (two) times daily.   Past Month   gabapentin (NEURONTIN) 600 MG tablet Take 600 mg by mouth 2 (two) times daily.   Past Month   HYDROmorphone  (DILAUDID) 4 MG tablet Take 1 tablet by mouth every 8 (eight) hours.   Past Month   omeprazole (PRILOSEC) 40 MG capsule Take 40 mg by mouth daily.   Past Month   senna-docusate (SENOKOT-S) 8.6-50 MG tablet Take 2 tablets by mouth 2 (two) times daily as needed for moderate constipation. 30 tablet 0 Past Month   sertraline (ZOLOFT) 25 MG tablet Take 75 mg by mouth daily.   Past Month    Assessment: 78 YO male presenting with COVID-19. On 4/3 AM, patient developed Afib with RVR, T wave abnormalities and began complaining of chest pain. Patient not on [facility] PTA. Pharmacy consulted for heparin dosing.  Heparin level 0.63 therapeutic on heparin  drip rate 700 uts/hr. Running through PIV and labs drawn from PICC - identified myself.  Lab was drawn appropriately per discussion with RN.  No bleeding reported. CBc stable S/p TEE/DCCV amiodarone to po     Goal of Therapy:  Heparin level 0.3-0.7 units/ml Monitor platelets by anticoagulation protocol: Yes   Plan:  Continue heparin 700 units/hr Daily heparin level and cbc Monitor s/s bleeding  Follow up transition to DOAC   Leota Sauers Pharm.D. CPP, BCPS Clinical Pharmacist [phone] 09/05/2023 3:06 PM

## 2023-09-05 NOTE — Progress Notes (Signed)
 Occupational Therapy Treatment Patient Details Name: Brian Ruiz MRN: [id number] DOB: 1946-04-16 Today's Date: 09/05/2023   History of present illness Brian Ruiz is a 78 y.o. male presents with SOB; admitted with acute respiratory failure with hypoxia, positive for covid19. PMH: HTN, depression/anxiety, chronic pain syndrome with opioid dependence, scoliosis   OT comments  Pt in bed upon arrival and initially tried to refuse and requesting therapist to come back tomorrow but he did agree to sit EOB for ADL and sitting tolerance activity with some encouragement, tolerating ~14 minutes and was able to lateral scoot toward rail at end of session. Pt declined SPTs to [facility] due to fatigue. OT will continue to follow acutely to maximize level of function and safety      If plan is discharge home, recommend the following:  A lot of help with bathing/dressing/bathroom;Assistance with cooking/housework;Direct supervision/assist for medications management;Assist for transportation;Help with stairs or ramp for entrance;Direct supervision/assist for financial management   Equipment Recommendations   None   Recommendations for Other Services      Precautions / Restrictions Precautions Precautions: Fall Recall of Precautions/Restrictions: Intact Precaution/Restrictions Comments: airborn/contact Restrictions Weight Bearing Restrictions Per Provider Order: No       Mobility Bed Mobility Overal bed mobility: Needs Assistance Bed Mobility: Supine to Sit, Sit to Supine     Supine to sit: Min assist, HOB elevated, Used rails Sit to supine: Mod assist   General bed mobility comments: with increased time, mod A with LEs back onto bed    Transfers                   General transfer comment: deferred due to fatigue     Balance Overall balance assessment: Needs assistance Sitting-balance support: Feet supported Sitting balance-Leahy Scale: Fair                                      ADL either performed or assessed with clinical judgement   ADL Overall ADL's : Needs assistance/impaired     Grooming: Wash/dry hands;Wash/dry face;Contact guard assist;Sitting                                 General ADL Comments: pt declined SPTs to [facility] due to fatigue. pt agreeable to EOB sitting activity. P tolerated sitting EOB ~ 14 minutes    Extremity/Trunk Assessment Upper Extremity Assessment Upper Extremity Assessment: Generalized weakness   Lower Extremity Assessment Lower Extremity Assessment: Defer to PT evaluation   Cervical / Trunk Assessment Cervical / Trunk Assessment: Kyphotic    Vision Ability to See in Adequate Light: 0 Adequate Patient Visual Report: No change from baseline     Perception     Praxis     Communication Communication Factors Affecting Communication: Hearing impaired   Cognition Arousal: Alert Behavior During Therapy: WFL for tasks assessed/performed Cognition: Cognition impaired         Attention impairment (select first level of impairment): Selective attention Executive functioning impairment (select all impairments): Problem solving, Reasoning, Sequencing                   Following commands: Intact        Cueing   Cueing Techniques: Verbal cues, Gestural cues, Tactile cues  Exercises      Shoulder Instructions       General Comments  Pertinent Vitals/ Pain       Pain Assessment Pain Assessment: No/denies pain  Home Living                                          Prior Functioning/Environment              Frequency  Min 2X/week        Progress Toward Goals  OT Goals(current goals can now be found in the care plan section)  Progress towards OT goals: OT to reassess next treatment     Plan      Co-evaluation                 AM-PAC OT "6 Clicks" Daily Activity     Outcome Measure   Help from another person eating meals?: A  Little Help from another person taking care of personal grooming?: A Little Help from another person toileting, which includes using toliet, bedpan, or urinal?: A Lot Help from another person bathing (including washing, rinsing, drying)?: A Lot Help from another person to put on and taking off regular upper body clothing?: A Lot Help from another person to put on and taking off regular lower body clothing?: A Lot 6 Click Score: 14    End of Session    OT Visit Diagnosis: Unsteadiness on feet (R26.81);Other abnormalities of gait and mobility (R26.89);Muscle weakness (generalized) (M62.81)   Activity Tolerance Patient limited by fatigue   Patient Left in bed;with call bell/phone within reach;with bed alarm set   Nurse Communication          Time: 1610-9604 OT Time Calculation (min): 27 min  Charges: OT General Charges $OT Visit: 1 Visit OT Treatments $Self Care/Home Management : 8-22 mins $Therapeutic Activity: 8-22 mins  Brian Ruiz 09/05/2023, 3:15 PM

## 2023-09-05 NOTE — Progress Notes (Signed)
 PHARMACY - ANTICOAGULATION CONSULT NOTE  Pharmacy Consult for heparin Indication: chest pain/ACS and atrial fibrillation  Allergies  Allergen Reactions   Nsaids Shortness Of Breath    Other reaction(s): Bronchospasm (ALLERGY/intolerance)   Oxycodone Other (See Comments)    Agitation. Per the any pain medication that has codeine in it causes that reaction. Other reaction(s): Other Agitation. Per the any pain medication that has codeine in it causes that reaction.   Tolmetin Shortness Of Breath   Tramadol Anaphylaxis   Fentanyl Other (See Comments)   Codeine Anxiety    Other reaction(s): Bronchospasm (ALLERGY/intolerance), Other (See Comments) Irritable     Patient Measurements: Height: 6' (182.9 cm) Weight: 83.6 kg (184 lb 4.9 oz) IBW/kg (Calculated) : 77.6 HEPARIN DW (KG): 82.2  Vital Signs: Temp: 99.2 F (37.3 C) (04/07 2033) Temp Source: Oral (04/07 2033) BP: 120/72 (04/08 0400)  Labs: Recent Labs    09/03/23 0657 09/03/23 4098 09/04/23 0627 09/04/23 0628 09/04/23 1812 09/05/23 0500  HEPARINUNFRC  --    < >  --  0.80* >1.10* 0.70  CREATININE 2.91*  --  2.39*  --   --  1.98*   < > = values in this interval not displayed.    Estimated Creatinine Clearance: 34.3 mL/min (A) (by C-G formula based on SCr of 1.98 mg/dL (H)).     Medications:  Medications Prior to Admission  Medication Sig Dispense Refill Last Dose/Taking   acetaminophen (TYLENOL) 500 MG tablet Take 1,000 mg by mouth every 8 (eight) hours as needed for mild pain (pain score 1-3) or moderate pain (pain score 4-6). For pain.   Past Month   Artificial Tear Ointment (DRY EYES OP) Apply 1 drop to eye daily as needed (dry eyes).   Past Month   carvedilol (COREG) 12.5 MG tablet Take 1 tablet by mouth 2 (two) times daily.   Past Month   gabapentin (NEURONTIN) 600 MG tablet Take 600 mg by mouth 2 (two) times daily.   Past Month   HYDROmorphone (DILAUDID) 4 MG tablet Take 1 tablet by mouth every 8 (eight)  hours.   Past Month   omeprazole (PRILOSEC) 40 MG capsule Take 40 mg by mouth daily.   Past Month   senna-docusate (SENOKOT-S) 8.6-50 MG tablet Take 2 tablets by mouth 2 (two) times daily as needed for moderate constipation. 30 tablet 0 Past Month   sertraline (ZOLOFT) 25 MG tablet Take 75 mg by mouth daily.   Past Month    Assessment: 78 YO male presenting with COVID-19. On 4/3 AM, patient developed Afib with RVR, T wave abnormalities and began complaining of chest pain. Patient not on [facility] PTA. Pharmacy consulted for heparin dosing.  Heparin level is supra-therapeutic and trended up to >1.1 units/mL.  Lab was drawn appropriately per discussion with RN.  No bleeding reported.  4/8 AM update:  Heparin level therapeutic   Goal of Therapy:  Heparin level 0.3-0.7 units/ml Monitor platelets by anticoagulation protocol: Yes   Plan:  Cont heparin 700 units/hr Heparin level in 8 hours  Abran Duke, PharmD, BCPS Clinical Pharmacist Phone: [phone]

## 2023-09-05 NOTE — Progress Notes (Signed)
 Progress Note   Patient: Brian Ruiz:[id number] DOB: May 07, 1946 DOA: 08/28/2023     7 DOS: the patient was seen and examined on 09/05/2023   Brief hospital course: ALEXANDERJAMES BERG is a 78 y.o. male with past medical history significant for HTN, depression/anxiety, chronic pain syndrome with opioid dependence who presented to [facility] on 3/31 with complaints of generalized weakness and shortness of breath.  Upon evaluation in the emergency department patient was found to be positive for COVID-19 via PCR.  Patient was additionally found to be hypoxic requiring initiation of supplemental oxygen.  TRH consulted for admission.   Significant Hospital events: 3/31: Admit to [facility], COVID viral pneumonia 4/3: A-fib with RVR, elevated troponin 5919 w/ concern for NSTEMI; started on amiodarone/heparin drip; transfer to [facility] 4/4: s/p Cardiac catheterization, transferring to 2H  Assessment and Plan:  Acute metabolic encephalopathy - Multifactorial etiology.  Appears to be resolved.  Patient alert and oriented.  Acute hypoxic respiratory failure - Factorial etiology given COVID and HFrEF.  Appears to be showing improvement.  Able to be weaned down from 4 L to room air this morning.  O2 sats 92.  Will continue to monitor closely.  COVID-19 pneumonia - COVID-positive PCR on presentation.  Completed 5 days of Paxlovid.  Continue Decadron, supplemental O2.  Wean O2 as above.  NSTEMI - Chest discomfort morning of 4/3 with concurrent rapid A-fib RVR.  Initial troponin greater than 5900.  Echo noting EF 35-40% severe LV hypokinesis concerning for large LAD ischemia or toxic dose.  Cardiology following closely.  Cath performed 4/4: LHC with no obstructive CAD; RHC on 09/01/23 w/ CI of 1.2-1.5 with normal filling pressures.  Continues on heparin drip.    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  Reduced EF on initial presentation.  Repeat echo pending (TEE complete  but no report yet).  Cardiac output tenuous.  Milrinone discontinued yesterday/7.  Mild improvement in kidney function again this morning.  Continues on IV Lasix 20 mg daily.  Monitor urine output recheck BMP in AM.    Acute kidney injury - Likely secondary to cardiorenal etiology.  Creatinine peaked at 2.91, now trending down to 1.98.  Continue to monitor urine output.  Repeat BMP and mag in AM.  Atrial fibrillation with RVR - Showing improvement with amiodarone drip.  DCCV 4/7.  Digoxin discontinued.  Discussion afterwards about p.o. rhythm agents.  Weaning heparin.  Shock liver with transaminitis - Very mild.  LFTs trending downward.  Chronic pain - Patient takes opioids at baseline.  Continue current care.  Goals of care - Currently receiving palliative care at home.  It is possible that he may be discharged with palliative inotropes or even hospice.  All this depends on where his baseline lies.  Hypoxia, renal function, cardiac output all showing small improvements daily.     Subjective: Patient resting comfortably this morning.  States he feels a little better.  Denies any worsening shortness of breath, cough, fever, chills, chest pain, nausea, vomiting, abdominal pain.  Physical Exam: Vitals:   09/04/23 2339 09/05/23 0400 09/05/23 0701 09/05/23 0811  BP: 119/79 120/72  (!) 141/88  Pulse:    70  Resp: 14 (!) 21  17  Temp:    98.2 F (36.8 C)  TempSrc:    Oral  SpO2: 91% 93%  94%  Weight:   83.5 kg   Height:       GENERAL:  Alert, pleasant, no  acute distress  HEENT:  EOMI, nasal cannula CARDIOVASCULAR: Irregularly irregular RESPIRATORY: Poor air movement bilaterally GASTROINTESTINAL:  Soft, nontender, nondistended EXTREMITIES:  trace LE edema bilaterally NEURO:  No new focal deficits appreciated SKIN:  No rashes noted PSYCH:  Appropriate mood and affect   Data Reviewed:  There are no new results to review at this time.  Family Communication: None at  bedside  Disposition: Status is: Inpatient Remains inpatient appropriate because: Acute hypoxic respiratory failure  Planned Discharge Destination: Rehab    Time spent: 33 minutes  Author: Deanna Artis, DO 09/05/2023 11:47 AM  For on call review [email or web address].

## 2023-09-05 NOTE — Plan of Care (Signed)
  Problem: Education: Goal: Knowledge of risk factors and measures for prevention of condition will improve Outcome: Progressing   Problem: Coping: Goal: Psychosocial and spiritual needs will be supported Outcome: Progressing   Problem: Respiratory: Goal: Will maintain a patent airway Outcome: Progressing Goal: Complications related to the disease process, condition or treatment will be avoided or minimized Outcome: Progressing   Problem: Education: Goal: Knowledge of General Education information will improve Description: Including pain rating scale, medication(s)/side effects and non-pharmacologic comfort measures Outcome: Progressing   Problem: Health Behavior/Discharge Planning: Goal: Ability to manage health-related needs will improve Outcome: Progressing   Problem: Clinical Measurements: Goal: Ability to maintain clinical measurements within normal limits will improve Outcome: Progressing Goal: Will remain free from infection Outcome: Progressing Goal: Diagnostic test results will improve Outcome: Progressing Goal: Respiratory complications will improve Outcome: Progressing Goal: Cardiovascular complication will be avoided Outcome: Progressing   Problem: Activity: Goal: Risk for activity intolerance will decrease Outcome: Progressing   Problem: Nutrition: Goal: Adequate nutrition will be maintained Outcome: Progressing   Problem: Coping: Goal: Level of anxiety will decrease Outcome: Progressing   Problem: Elimination: Goal: Will not experience complications related to bowel motility Outcome: Progressing Goal: Will not experience complications related to urinary retention Outcome: Progressing   Problem: Pain Managment: Goal: General experience of comfort will improve and/or be controlled Outcome: Progressing   Problem: Safety: Goal: Ability to remain free from injury will improve Outcome: Progressing   Problem: Skin Integrity: Goal: Risk for impaired  skin integrity will decrease Outcome: Progressing   Problem: Activity: Goal: Ability to tolerate increased activity will improve Outcome: Progressing   Problem: Clinical Measurements: Goal: Ability to maintain a body temperature in the normal range will improve Outcome: Progressing   Problem: Respiratory: Goal: Ability to maintain adequate ventilation will improve Outcome: Progressing Goal: Ability to maintain a clear airway will improve Outcome: Progressing   Problem: Education: Goal: Understanding of CV disease, CV risk reduction, and recovery process will improve Outcome: Progressing Goal: Individualized Educational Video(s) Outcome: Progressing   Problem: Activity: Goal: Ability to return to baseline activity level will improve Outcome: Progressing   Problem: Cardiovascular: Goal: Ability to achieve and maintain adequate cardiovascular perfusion will improve Outcome: Progressing Goal: Vascular access site(s) Level 0-1 will be maintained Outcome: Progressing   Problem: Health Behavior/Discharge Planning: Goal: Ability to safely manage health-related needs after discharge will improve Outcome: Progressing

## 2023-09-06 ENCOUNTER — Inpatient Hospital Stay (HOSPITAL_COMMUNITY)

## 2023-09-06 DIAGNOSIS — J9601 Acute respiratory failure with hypoxia: Secondary | ICD-10-CM | POA: Diagnosis not present

## 2023-09-06 DIAGNOSIS — I4891 Unspecified atrial fibrillation: Secondary | ICD-10-CM | POA: Diagnosis not present

## 2023-09-06 DIAGNOSIS — I5043 Acute on chronic combined systolic (congestive) and diastolic (congestive) heart failure: Secondary | ICD-10-CM | POA: Diagnosis not present

## 2023-09-06 LAB — BASIC METABOLIC PANEL WITH GFR
Anion gap: 9 (ref 5–15)
BUN: 50 mg/dL — ABNORMAL HIGH (ref 8–23)
CO2: 25 mmol/L (ref 22–32)
Calcium: 8.5 mg/dL — ABNORMAL LOW (ref 8.9–10.3)
Chloride: 93 mmol/L — ABNORMAL LOW (ref 98–111)
Creatinine, Ser: 1.73 mg/dL — ABNORMAL HIGH (ref 0.61–1.24)
GFR, Estimated: 40 mL/min — ABNORMAL LOW (ref >60)
Glucose, Bld: 96 mg/dL (ref 70–99)
Potassium: 4.2 mmol/L (ref 3.5–5.1)
Sodium: 127 mmol/L — ABNORMAL LOW (ref 135–145)

## 2023-09-06 LAB — HEPARIN LEVEL (UNFRACTIONATED): Heparin Unfractionated: 0.36 [IU]/mL (ref 0.30–0.70)

## 2023-09-06 LAB — CBC
HCT: 38.9 % — ABNORMAL LOW (ref 39.0–52.0)
Hemoglobin: 13.2 g/dL (ref 13.0–17.0)
MCH: 30.5 pg (ref 26.0–34.0)
MCHC: 33.9 g/dL (ref 30.0–36.0)
MCV: 89.8 fL (ref 80.0–100.0)
Platelets: 179 10*3/uL (ref 150–400)
RBC: 4.33 MIL/uL (ref 4.22–5.81)
RDW: 13.2 % (ref 11.5–15.5)
WBC: 15 10*3/uL — ABNORMAL HIGH (ref 4.0–10.5)
nRBC: 0 % (ref 0.0–0.2)

## 2023-09-06 LAB — MAGNESIUM: Magnesium: 2.2 mg/dL (ref 1.7–2.4)

## 2023-09-06 LAB — COOXEMETRY PANEL
Carboxyhemoglobin: 1.8 % — ABNORMAL HIGH (ref 0.5–1.5)
Methemoglobin: <0.7 % (ref 0.0–1.5)
O2 Saturation: 65.7 %
Total hemoglobin: 13.7 g/dL (ref 12.0–16.0)

## 2023-09-06 MED ORDER — APIXABAN 5 MG PO TABS
5.0000 mg | ORAL_TABLET | Freq: Two times a day (BID) | ORAL | Status: DC
  Administered 2023-09-06 – 2023-09-08 (×5): 5 mg via ORAL
  Filled 2023-09-06 (×5): qty 1

## 2023-09-06 MED ORDER — SPIRONOLACTONE 25 MG PO TABS
25.0000 mg | ORAL_TABLET | Freq: Every day | ORAL | Status: DC
  Administered 2023-09-07 – 2023-09-08 (×2): 25 mg via ORAL
  Filled 2023-09-06 (×2): qty 1

## 2023-09-06 NOTE — Progress Notes (Addendum)
 PROGRESS NOTE    Brian Ruiz  ZHY:[id number] DOB: Aug 31, 1945 DOA: 08/28/2023 PCP: [facility]    77/M w HTN, depression/anxiety, chronic pain syndrome with opioid dependence who presented to [facility] on 3/31 with complaints of generalized weakness and shortness of breath.In ED, Hypoxic,  positive for COVID-19 admitted  3/31: Admit to [facility], COVID viral pneumonia 4/3: A-fib with RVR, elevated troponin 5919 w/ concern for NSTEMI; started on amiodarone/heparin drip; transfer to [facility] 2D echo noted EF 20-25% with R WMA in LAD territory, severe LVH with concern for low output 4/4: s/p Cardiac catheterization, transferring to 2H> cardiogenic shock -Started on milrinone -RHC noted normal filling pressures, milrinone discontinued 4/7  Subjective: -Reports generalized weakness, still has a Foley catheter  Assessment and Plan:   Acute respiratory failure with hypoxia (HCC) -Multifactorial secondary to CHF and COVID-pneumonia Improving, continue to wean O2 -Remove Foley today, PT reassess  Acute systolic CHF with low output Cardiogenic shock -Echo this admission noted EF 20-25% with R WMA in LAD territory, severe LVH -LHC with no obstructive CAD, RHC with normal filling pressures -Milrinone discontinued 4/7 -Now switched to oral torsemide, continue Farxiga, Aldactone -DC Foley, increase activity  NSTEMI, type II MI -Troponin peaked at 5900, 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 -LHC 4/4 with no obstructive CAD -Continue aspirin  Paroxysmal atrial fibrillation -Underwent TEE/DCCV 4/7 -Remains in sinus rhythm, now switched to oral amiodarone, remains on heparin gtt., likely change to DOAC today  AKI -Baseline creatinine 1, creatinine peaked at 2.9, now improving  Chronic pain Narcotic dependence -On oral Dilaudid at baseline  Memory loss, early dementia -Monitor for delirium  COVID-19 infection -Improved, completed 5-day course of  Paxlovid -Discontinue IV Decadron  Abnormal LFTs -Likely from passive hepatic congestion, improving   DVT prophylaxis:IV heparin Code Status: DNR Family Communication: none present Disposition Plan: Home vs SNF  Consultants:    Procedures:   Antimicrobials:    Objective: Vitals:   09/05/23 1912 09/05/23 2338 09/06/23 0401 09/06/23 0727  BP: 114/68 125/80 134/66 138/74  Pulse: 60 62 60 67  Resp: 14 17 17  (!) 22  Temp: 97.7 F (36.5 C) 98.3 F (36.8 C) 97.6 F (36.4 C) 97.6 F (36.4 C)  TempSrc: Oral Oral Oral Axillary  SpO2: 94% 93% 92% 95%  Weight:   81.1 kg   Height:        Intake/Output Summary (Last 24 hours) at 09/06/2023 1016 Last data filed at 09/06/2023 0852 Gross per 24 hour  Intake 450.56 ml  Output 2525 ml  Net -2074.44 ml   Filed Weights   09/04/23 0400 09/05/23 0701 09/06/23 0401  Weight: 83.6 kg 83.5 kg 81.1 kg    Examination:  General exam: Appears calm and comfortable  Respiratory system: Clear to auscultation Cardiovascular system: S1 & S2 heard, RRR.  Abd: nondistended, soft and nontender.Normal bowel sounds heard. Central nervous system: Alert and oriented. No focal neurological deficits. Extremities: no edema Skin: No rashes Psychiatry:  Mood & affect appropriate.     Data Reviewed:   CBC: Recent Labs  Lab 08/31/23 0530 09/01/23 0438 09/01/23 1032 09/01/23 1241 09/02/23 0519 09/06/23 0446  WBC 17.3* 18.6* 21.6*  --  17.3* 15.0*  NEUTROABS 14.4*  --  18.2*  --   --   --   HGB 14.3 12.6* 14.4 14.6  14.6 13.8 13.2  HCT 44.7 37.4* 42.4 43.0  43.0 40.4 38.9*  MCV 96.1 93.5 91.2  --  91.6 89.8  PLT 199 157 207  --  207 179   Basic Metabolic Panel: Recent Labs  Lab 08/30/23 1738 08/31/23 0530 09/02/23 0519 09/03/23 0657 09/04/23 0627 09/05/23 0500 09/06/23 0446  NA 131*   < > 134* 130* 129* 128* 127*  K 3.9   < > 4.3 3.6 4.2 4.2 4.2  CL 96*   < > 97* 95* 96* 95* 93*  CO2 21*   < > 21* 21* 22 23 25   GLUCOSE 141*    < > 179* 172* 104* 117* 96  BUN 21   < > 57* 74* 69* 58* 50*  CREATININE 1.19   < > 2.10* 2.91* 2.39* 1.98* 1.73*  CALCIUM 9.1   < > 8.4* 8.2* 8.0* 8.5* 8.5*  MG 1.8   < > 2.3 2.2 2.2 2.2 2.2  PHOS 4.4  --   --   --   --   --   --    < > = values in this interval not displayed.   GFR: Estimated Creatinine Clearance: 39.2 mL/min (A) (by C-G formula based on SCr of 1.73 mg/dL (H)). Liver Function Tests: Recent Labs  Lab 08/30/23 1738 08/31/23 0530 09/01/23 1032 09/02/23 0519  AST  --  136* 92* 55*  ALT  --  43 50* 44  ALKPHOS  --  41 31* 32*  BILITOT  --  0.6 0.8 0.8  PROT  --  6.2* 5.9* 5.4*  ALBUMIN 4.1 3.4* 3.2* 3.0*   No results for input(s): "LIPASE", "AMYLASE" in the last 168 hours. No results for input(s): "AMMONIA" in the last 168 hours. Coagulation Profile: No results for input(s): "INR", "PROTIME" in the last 168 hours. Cardiac Enzymes: No results for input(s): "CKTOTAL", "CKMB", "CKMBINDEX", "TROPONINI" in the last 168 hours. BNP (last 3 results) No results for input(s): "PROBNP" in the last 8760 hours. HbA1C: No results for input(s): "HGBA1C" in the last 72 hours. CBG: No results for input(s): "GLUCAP" in the last 168 hours. Lipid Profile: No results for input(s): "CHOL", "HDL", "LDLCALC", "TRIG", "CHOLHDL", "LDLDIRECT" in the last 72 hours. Thyroid Function Tests: No results for input(s): "TSH", "T4TOTAL", "FREET4", "T3FREE", "THYROIDAB" in the last 72 hours. Anemia Panel: No results for input(s): "VITAMINB12", "FOLATE", "FERRITIN", "TIBC", "IRON", "RETICCTPCT" in the last 72 hours. Urine analysis:    Component Value Date/Time   COLORURINE YELLOW 11/11/2020 0041   APPEARANCEUR CLEAR 11/11/2020 0041   LABSPEC 1.018 11/11/2020 0041   PHURINE 6.0 11/11/2020 0041   GLUCOSEU NEGATIVE 11/11/2020 0041   HGBUR SMALL (A) 11/11/2020 0041   BILIRUBINUR NEGATIVE 11/11/2020 0041   KETONESUR NEGATIVE 11/11/2020 0041   PROTEINUR NEGATIVE 11/11/2020 0041    UROBILINOGEN 0.2 05/15/2011 1151   NITRITE NEGATIVE 11/11/2020 0041   LEUKOCYTESUR NEGATIVE 11/11/2020 0041   Sepsis Labs: @LABRCNTIP (procalcitonin:4,lacticidven:4)  ) Recent Results (from the past 240 hours)  Blood Culture (routine x 2)     Status: None   Collection Time: 08/28/23  4:15 PM   Specimen: BLOOD  Result Value Ref Range Status   Specimen Description   Final    BLOOD LEFT ANTECUBITAL Performed at [facility], [address], [place], [place] [address]    Special Requests   Final    BOTTLES DRAWN AEROBIC AND ANAEROBIC Blood Culture results may not be optimal due to an inadequate volume of blood received in culture bottles Performed at [facility], [address] [place], [place] [address]    Culture   Final    NO GROWTH 5 DAYS Performed at [facility], [address] [place], [place] [address]    Report Status 09/02/2023 FINAL  Final  Blood Culture (routine x 2)     Status: None   Collection Time: 08/28/23  4:18 PM   Specimen: BLOOD LEFT WRIST  Result Value Ref Range Status   Specimen Description   Final    BLOOD LEFT WRIST Performed at [facility], [address] [place], [place] [address]    Special Requests   Final    BOTTLES DRAWN AEROBIC AND ANAEROBIC Blood Culture results may not be optimal due to an inadequate volume of blood received in culture bottles Performed at [facility], [address], [place], [place] [address]    Culture   Final    NO GROWTH 5 DAYS Performed at [facility], [address], [place], [place] [address]    Report Status 09/02/2023 FINAL  Final  Resp panel by RT-PCR (RSV, Flu A&B, Covid) Anterior Nasal Swab     Status: Abnormal   Collection Time: 08/28/23  5:04 PM   Specimen: Anterior Nasal Swab  Result Value Ref Range Status   SARS Coronavirus 2 by RT PCR POSITIVE (A) NEGATIVE Final    Comment: (NOTE) SARS-CoV-2 target nucleic acids are  DETECTED.  The SARS-CoV-2 RNA is generally detectable in upper respiratory specimens during the acute phase of infection. Positive results are indicative of the presence of the identified virus, but do not rule out bacterial infection or co-infection with other pathogens not detected by the test. Clinical correlation with patient history and other diagnostic information is necessary to determine patient infection status. The expected result is Negative.  Fact Sheet for Patients: BloggerCourse.com  Fact Sheet for Healthcare Providers: SeriousBroker.it  This test is not yet approved or cleared by the [place] FDA and  has been authorized for detection and/or diagnosis of SARS-CoV-2 by FDA under an Emergency Use Authorization (EUA).  This EUA will remain in effect (meaning this test can be used) for the duration of  the COVID-19 declaration under Section 564(b)(1) of the A ct, 21 U.S.C. section 360bbb-3(b)(1), unless the authorization is terminated or revoked sooner.     Influenza A by PCR NEGATIVE NEGATIVE Final   Influenza B by PCR NEGATIVE NEGATIVE Final    Comment: (NOTE) The Xpert Xpress SARS-CoV-2/FLU/RSV plus assay is intended as an aid in the diagnosis of influenza from Nasopharyngeal swab specimens and should not be used as a sole basis for treatment. Nasal washings and aspirates are unacceptable for Xpert Xpress SARS-CoV-2/FLU/RSV testing.  Fact Sheet for Patients: BloggerCourse.com  Fact Sheet for Healthcare Providers: SeriousBroker.it  This test is not yet approved or cleared by the [place] FDA and has been authorized for detection and/or diagnosis of SARS-CoV-2 by FDA under an Emergency Use Authorization (EUA). This EUA will remain in effect (meaning this test can be used) for the duration of the COVID-19 declaration under Section 564(b)(1) of the Act, 21  U.S.C. section 360bbb-3(b)(1), unless the authorization is terminated or revoked.     Resp Syncytial Virus by PCR NEGATIVE NEGATIVE Final    Comment: (NOTE) Fact Sheet for Patients: BloggerCourse.com  Fact Sheet for Healthcare Providers: SeriousBroker.it  This test is not yet approved or cleared by the [place] FDA and has been authorized for detection and/or diagnosis of SARS-CoV-2 by FDA under an Emergency Use Authorization (EUA). This EUA will remain in effect (meaning this test can be used) for the duration of  the COVID-19 declaration under Section 564(b)(1) of the Act, 21 U.S.C. section 360bbb-3(b)(1), unless the authorization is terminated or revoked.  Performed at [facility], [address], [place], [place] [address]   MRSA Next Gen by PCR, Nasal     Status: None   Collection Time: 08/31/23  9:20 AM   Specimen: Nasal Mucosa; Nasal Swab  Result Value Ref Range Status   MRSA by PCR Next Gen NOT DETECTED NOT DETECTED Final    Comment: (NOTE) The GeneXpert MRSA Assay (FDA approved for NASAL specimens only), is one component of a comprehensive MRSA colonization surveillance program. It is not intended to diagnose MRSA infection nor to guide or monitor treatment for MRSA infections. Test performance is not FDA approved in patients less than 72 years old. Performed at [facility], [address] [place], [place] [address]      Radiology Studies: ECHO TEE Result Date: 09/05/2023    TRANSESOPHOGEAL ECHO REPORT   Patient Name:   Brian Ruiz Date of Exam: 09/04/2023 Medical Rec #:  [id number]     Height:       72.0 in Accession #:    [id number]    Weight:       184.3 lb Date of Birth:  02-Sep-1945     BSA:          2.058 m Patient Age:    77 years      BP:           110/71 mmHg Patient Gender: M             HR:           90 bpm. Exam Location:  Inpatient Procedure: 2D Echo, Color  Doppler and Cardiac Doppler (Both Spectral and Color            Flow Doppler were utilized during procedure). Indications:     Cardioversion  History:         Patient has prior history of Echocardiogram examinations, most                  recent 08/31/2023.  Sonographer:     Harriette Bouillon RDCS Referring Phys:  [id number] [place] LEE Diagnosing Phys: Wilfred Lacy PROCEDURE: The transesophogeal probe was passed without difficulty through the esophogus of the patient. Sedation performed by different physician. The patient developed no complications during the procedure.  IMPRESSIONS  1. Left ventricular ejection fraction, by estimation, is 30%. The left ventricle has moderate to severely decreased function. The left ventricle demonstrates regional wall motion abnormalities with the function of basilar LV segments relatively preserved and mid to apical segments more hypokinetic.  2. Right ventricular systolic function is mildly reduced. The right ventricular size is normal.  3. Left atrial size was moderately dilated. No left atrial/left atrial appendage thrombus was detected.  4. No PFO or ASD by color doppler.  5. There was prolapse of the posterior leaflet without flail segment. The mitral valve is abnormal. Mild mitral valve regurgitation. No evidence of mitral stenosis.  6. The aortic valve is tricuspid. Aortic valve regurgitation is moderate. No aortic stenosis is present. FINDINGS  Left Ventricle: Left ventricular ejection fraction, by estimation, is 30%. The left ventricle has moderate to severely decreased function. The left ventricle demonstrates regional wall motion abnormalities. The left ventricular internal cavity size was normal in size. Right Ventricle: The right ventricular size is normal. No increase in right ventricular wall thickness. Right ventricular systolic function is  mildly reduced. Left Atrium: Left atrial size was moderately dilated. No left atrial/left atrial appendage thrombus was detected.  Right Atrium: Right atrial size was normal in size. Pericardium: There is no evidence of pericardial effusion. 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 The mitral valve is abnormal. Mild mitral valve regurgitation. No evidence of mitral valve stenosis. Tricuspid Valve: The tricuspid valve is normal in structure. Tricuspid valve regurgitation is mild. Aortic Valve: The aortic valve is tricuspid. Aortic valve regurgitation is moderate. No aortic stenosis is present. Pulmonic Valve: The pulmonic valve was normal in structure. Pulmonic valve regurgitation is trivial. Aorta: The aortic root is normal in size and structure. IAS/Shunts: No PFO or ASD by color doppler. Dalton McleanMD Electronically signed by Wilfred Lacy Signature Date/Time: 09/05/2023/3:35:45 PM    Final    EP STUDY Result Date: 09/04/2023 See surgical note for result.    Scheduled Meds:  amiodarone  400 mg Oral BID   Chlorhexidine Gluconate Cloth  6 each Topical Daily   dapagliflozin propanediol  10 mg Oral Daily   dexamethasone (DECADRON) injection  6 mg Intravenous Daily   HYDROmorphone  4 mg Oral Q8H   pantoprazole  40 mg Oral Daily   polyethylene glycol  17 g Oral BID   senna-docusate  2 tablet Oral BID   sertraline  25 mg Oral TID   sodium chloride flush  10-40 mL Intracatheter Q12H   sodium chloride flush  3 mL Intravenous Q12H   spironolactone  12.5 mg Oral Daily   torsemide  20 mg Oral Daily   Continuous Infusions:  heparin 700 Units/hr (09/06/23 0442)     LOS: 8 days    Time spent:    Zannie Cove, MD [facility]   09/06/2023, 10:16 AM

## 2023-09-06 NOTE — Progress Notes (Addendum)
 Advanced Heart Failure Team Progress Note   Primary Physician: [facility] Cardiologist:  Parke Poisson, MD  Reason for Consultation: Heart Failure/A Fib  Interval hx:  4/7 DC-CV -->SR   Denies SOB.  Objective:    Vital Signs:   Temp:  [97.6 F (36.4 C)-98.3 F (36.8 C)] 97.6 F (36.4 C) (04/09 0727) Pulse Rate:  [60-70] 67 (04/09 0727) Resp:  [14-22] 22 (04/09 0727) BP: (114-142)/(66-80) 138/74 (04/09 0727) SpO2:  [92 %-95 %] 95 % (04/09 0727) Weight:  [81.1 kg] 81.1 kg (04/09 0401) Last BM Date : 09/04/23  Weight change: Filed Weights   09/04/23 0400 09/05/23 0701 09/06/23 0401  Weight: 83.6 kg 83.5 kg 81.1 kg   Intake/Output:  Intake/Output Summary (Last 24 hours) at 09/06/2023 0941 Last data filed at 09/06/2023 0852 Gross per 24 hour  Intake 450.56 ml  Output 2525 ml  Net -2074.44 ml   CVP 4-5  Physical Exam  General:   No resp difficulty Neck: supple. no JVD.  Cor: PMI nondisplaced. Regular rate & rhythm. No rubs, gallops or murmurs. Lungs: clear Abdomen: soft, nontender, nondistended.  Extremities: no cyanosis, clubbing, rash, edema Neuro: alert & oriented x3   Telemetry   SR with PACs   Labs   Basic Metabolic Panel: Recent Labs  Lab 08/30/23 1738 08/31/23 0530 09/02/23 0519 09/03/23 0657 09/04/23 0627 09/05/23 0500 09/06/23 0446  NA 131*   < > 134* 130* 129* 128* 127*  K 3.9   < > 4.3 3.6 4.2 4.2 4.2  CL 96*   < > 97* 95* 96* 95* 93*  CO2 21*   < > 21* 21* 22 23 25   GLUCOSE 141*   < > 179* 172* 104* 117* 96  BUN 21   < > 57* 74* 69* 58* 50*  CREATININE 1.19   < > 2.10* 2.91* 2.39* 1.98* 1.73*  CALCIUM 9.1   < > 8.4* 8.2* 8.0* 8.5* 8.5*  MG 1.8   < > 2.3 2.2 2.2 2.2 2.2  PHOS 4.4  --   --   --   --   --   --    < > = values in this interval not displayed.    Liver Function Tests: Recent Labs  Lab 08/30/23 1738 08/31/23 0530 09/01/23 1032 09/02/23 0519  AST  --  136* 92* 55*  ALT  --  43 50* 44  ALKPHOS  --   41 31* 32*  BILITOT  --  0.6 0.8 0.8  PROT  --  6.2* 5.9* 5.4*  ALBUMIN 4.1 3.4* 3.2* 3.0*   No results for input(s): "LIPASE", "AMYLASE" in the last 168 hours. No results for input(s): "AMMONIA" in the last 168 hours.  CBC: Recent Labs  Lab 08/31/23 0530 09/01/23 0438 09/01/23 1032 09/01/23 1241 09/02/23 0519 09/06/23 0446  WBC 17.3* 18.6* 21.6*  --  17.3* 15.0*  NEUTROABS 14.4*  --  18.2*  --   --   --   HGB 14.3 12.6* 14.4 14.6  14.6 13.8 13.2  HCT 44.7 37.4* 42.4 43.0  43.0 40.4 38.9*  MCV 96.1 93.5 91.2  --  91.6 89.8  PLT 199 157 207  --  207 179   BNP: BNP (last 3 results) Recent Labs    08/30/23 1738 08/31/23 0705  BNP 2,770.5* 1,951.7*   CBG: No results for input(s): "GLUCAP" in the last 168 hours.  Imaging   No results found.   Medications:    Current Medications:  amiodarone  400 mg Oral BID   Chlorhexidine Gluconate Cloth  6 each Topical Daily   dapagliflozin propanediol  10 mg Oral Daily   dexamethasone (DECADRON) injection  6 mg Intravenous Daily   HYDROmorphone  4 mg Oral Q8H   pantoprazole  40 mg Oral Daily   polyethylene glycol  17 g Oral BID   senna-docusate  2 tablet Oral BID   sertraline  25 mg Oral TID   sodium chloride flush  10-40 mL Intracatheter Q12H   sodium chloride flush  3 mL Intravenous Q12H   spironolactone  12.5 mg Oral Daily   torsemide  20 mg Oral Daily   Infusions:  heparin 700 Units/hr (09/06/23 0442)   Assessment/Plan  Acute systolic CHF with low output>>cardiogenic shock: - Etiology not certain. HS troponin > 5900. Echo with EF 20-25% and RWMA in LAD territory, severe LVH. 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 - Received IVF bolus with improvement in SCr - CO-OX remains stable off Milrinone.  CVP 4-5. Continue torsemide 20 mg daily  - Increase spiro 25 mg daily  - Consider losartan tomorrow.  -  GDMT limited by AKI  2. Type 2 MI -Reported chest pain this admission. HS troponin peaked at > 5900 as above. LHC non obstructive CAD.   -Potentially ACS vs Takotsubo -Stop Heparin drip. Start eliquis 5 mg twice a day   3. Atrial fibrillation  -4/7 s/p TEE/DCCV--> NSR.   - Maintaining SR.  -Stop Heparin drip.  Has 500.00 deductible for Eliquis. Once that is met, Co-Pay will be 47.00. He can get 30 day free CoPay card at d/c. Discussed with him and his wife they are agreeable.  -Continue 400 mg twice a day x 7 days then 200 mg twice a day x 7 day , then 200 mg daily.   4. COVID-19 infection Acute respiratory failure  -Completed 5 day Paxlovid course  5. AKI -Scr baseline around 1 -sCr continues to improve and is down to 1.7.   6. Elevated LFTs -Improving  7. Chronic pain syndrome -On dilaudid 4 mg po q 8 hrs PRN  8. Memory impairment -? Dementia -Notes declining memory over several years. Watch for delirium.  PT working with him. ? SNF versus HH.   Goals of care have been discussed with son. Mr. Mccaskey and his wife currently live independently, however, need increasing levels of support. He is currently receiving palliative care at home.   Length of Stay: 8  Amy Clegg, NP  09/06/2023, 9:41 AM  Advanced Heart Failure Team Pager [phone] (M-F; 7a - 5p)  Please contact [facility] for night-coverage after hours (4p -7a ) and weekends on [email or web address]   Patient seen with NP, I formulated the plan and agree with the above note.   He remains in NSR after DCCV.  On heparin gtt and po amiodarone.   CXR with elevated right hemidiaphragm.    Creatinine trending down, 1.98 => 1.73 today.  Co-ox 66% off milrinone.  JVP around 6.     General: NAD, frail Neck: No JVD, no thyromegaly or thyroid nodule.  Lungs: Decreased BS on right.  CV: Nondisplaced PMI.  Heart regular S1/S2, no S3/S4, no murmur.  No peripheral edema.   Abdomen: Soft, nontender, no hepatosplenomegaly, no  distention.  Skin: Intact without lesions or rashes.  Neurologic: Alert and oriented x 3.  Psych: Normal  affect. Extremities: No clubbing or cyanosis.  HEENT: Normal.    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 ?Stress (Takotsubo-type) cardiomyopathy vs tachy-mediated CMP. TEE 4/7 showed LV EF 25-30% with apical/peri-apical akinesis, mild RV dysfunction, moderate AI. He is now back in NSR.  Looks euvolemic with CVP 6 and co-ox 66% off milrinone. Creatinine trending down, 2.39 => 1.98 => 1.73 today. GDMT limited by AKI.  - Continue torsemide 20 mg daily.  - Continue Farxiga 10 mg daily.  - Increase spironolactone to 25 mg daily.  - Need to keep in NSR.    Persistent atrial fibrillation, now s/p DCCV.  He remains in NSR today.  He is on amiodarone po and heparin gtt.  - Transition to amiodarone 400 mg bid. - Transition to Eliquis   Frail, limited to short distances by walker at baseline.  Needs PT evaluation.  ?Need for rehab stay prior to home.   Marca Ancona 09/06/2023 10:50 AM

## 2023-09-06 NOTE — Progress Notes (Signed)
 Occupational Therapy Treatment Patient Details Name: Brian Ruiz MRN: [id number] DOB: [birth year] Today's Date: 09/06/2023   History of present illness Brian Ruiz is a 78 y.o. male presents with SOB; admitted with acute respiratory failure with hypoxia, positive for covid19. Underwent cardioversion on 4/7. PMH: HTN, depression/anxiety, chronic pain syndrome with opioid dependence, scoliosis   OT comments  Pt progressing slowly toward established OT goals. Seen at request of RN who reports pt unable to get out of chair with +1 assist. Pt with continued confusion and poor insight into deficits. Pt constantly stating "what are we going to do next and how am I going to get around at home". Re-oriented to idea of need for rehabilitation and pt seems agreeable, but per chart, this has not been consistent. Pt very thankful for assist and follows one step commands. Needing step by step cues for optimal technique and sequencing during mobility as well as assist for bed mobility sequence and performance. OT to continue to follow.       If plan is discharge home, recommend the following:  A lot of help with bathing/dressing/bathroom;Assistance with cooking/housework;Direct supervision/assist for medications management;Assist for transportation;Help with stairs or ramp for entrance;Direct supervision/assist for financial management   Equipment Recommendations  None recommended by OT    Recommendations for Other Services      Precautions / Restrictions Precautions Precautions: Fall Precaution/Restrictions Comments: airborn/contact Restrictions Weight Bearing Restrictions Per Provider Order: No       Mobility Bed Mobility Overal bed mobility: Needs Assistance Bed Mobility: Sit to Supine       Sit to supine: Mod assist   General bed mobility comments: to bring BLE into bed and needing multiple cues for sequencing/    Transfers Overall transfer level: Needs assistance Equipment used:  Ambulation equipment used Transfers: Sit to/from Stand Sit to Stand: Mod assist, +2 safety/equipment, +2 physical assistance, Via lift equipment           General transfer comment: RN asking for assist to get pt up from low chair. as she has attempted x3 with stedy with no success. Facilitated appropriate positioning of pt and anterior weight shift prior to rise and pt needing up to mod A for STS up from chair with stedy. Transfer via Lift Equipment: Stedy   Balance Overall balance assessment: Needs assistance Sitting-balance support: Feet supported Sitting balance-Leahy Scale: Fair Sitting balance - Comments: with patients scoliosis and posterior pelvic tilt with increased trunk flexion   Standing balance support: Reliant on assistive device for balance, During functional activity, Bilateral upper extremity supported Standing balance-Leahy Scale: Poor                             ADL either performed or assessed with clinical judgement   ADL Overall ADL's : Needs assistance/impaired                         Toilet Transfer: Moderate assistance;+2 for physical assistance;+2 for safety/equipment Toilet Transfer Details (indicate cue type and reason): Mod A +2 up from low recliner with stedy                Extremity/Trunk Assessment Upper Extremity Assessment Upper Extremity Assessment: Generalized weakness   Lower Extremity Assessment Lower Extremity Assessment: Defer to PT evaluation        Vision   Vision Assessment?: No apparent visual deficits   Perception     Praxis  Communication Communication Communication: Impaired Factors Affecting Communication: Hearing impaired   Cognition Arousal: Alert Behavior During Therapy: WFL for tasks assessed/performed Cognition: Cognition impaired     Awareness: Online awareness impaired   Attention impairment (select first level of impairment): Selective attention Executive functioning  impairment (select all impairments): Problem solving, Reasoning, Sequencing OT - Cognition Comments: cues for safety and sequencing mobility. Needing repeated education at times                 Following commands: Intact (1-2 step commands)        Cueing   Cueing Techniques: Verbal cues, Gestural cues, Tactile cues  Exercises      Shoulder Instructions       General Comments VSS on 2L O2, however, pt RR did go up to 27 with mild DOE    Pertinent Vitals/ Pain       Pain Assessment Pain Assessment: Faces Faces Pain Scale: Hurts a little bit Pain Location: generalized Pain Descriptors / Indicators: Aching Pain Intervention(s): Limited activity within patient's tolerance, Monitored during session  Home Living                                          Prior Functioning/Environment              Frequency  Min 2X/week        Progress Toward Goals  OT Goals(current goals can now be found in the care plan section)  Progress towards OT goals: Progressing toward goals (slowly)  Acute Rehab OT Goals Patient Stated Goal: get back in bed OT Goal Formulation: With patient Time For Goal Achievement: 09/12/23 Potential to Achieve Goals: Fair ADL Goals Pt Will Perform Grooming: with modified independence;sitting Pt Will Perform Lower Body Dressing: with supervision;sit to/from stand Pt Will Transfer to Toilet: with supervision;ambulating;regular height toilet Pt Will Perform Toileting - Clothing Manipulation and hygiene: with supervision;sit to/from stand  Plan      Co-evaluation                 AM-PAC OT "6 Clicks" Daily Activity     Outcome Measure   Help from another person eating meals?: A Little Help from another person taking care of personal grooming?: A Little Help from another person toileting, which includes using toliet, bedpan, or urinal?: A Lot Help from another person bathing (including washing, rinsing, drying)?: A  Lot Help from another person to put on and taking off regular upper body clothing?: A Lot Help from another person to put on and taking off regular lower body clothing?: A Lot 6 Click Score: 14    End of Session Equipment Utilized During Treatment: Other (comment) (stedy)  OT Visit Diagnosis: Unsteadiness on feet (R26.81);Other abnormalities of gait and mobility (R26.89);Muscle weakness (generalized) (M62.81)   Activity Tolerance Patient limited by fatigue   Patient Left in bed;with call bell/phone within reach;with bed alarm set   Nurse Communication Mobility status        Time: 6578-4696 OT Time Calculation (min): 21 min  Charges: OT General Charges $OT Visit: 1 Visit OT Treatments $Therapeutic Activity: 8-22 mins  Brian Ruiz, OTR/L [facility] Acute Rehabilitation Office: [phone]   Brian Ruiz 09/06/2023, 4:55 PM

## 2023-09-06 NOTE — Care Management Important Message (Signed)
 Important Message  Patient Details  Name: Brian Ruiz MRN: [id number] Date of Birth: 1946/02/07   Important Message Given:  Yes - Medicare IM Spoke with the patient wife who ask me to send a copy of the IM to the home address for her records.     Melaya Hoselton 09/06/2023, 8:52 AM

## 2023-09-06 NOTE — Progress Notes (Signed)
 Physical Therapy Treatment Patient Details Name: Brian Ruiz MRN: [id number] DOB: 1946/02/03 Today's Date: 09/06/2023   History of Present Illness Brian Ruiz is a 78 y.o. male presents with SOB; admitted with acute respiratory failure with hypoxia, positive for covid19. Underwent cardioversion on 4/7. PMH: HTN, depression/anxiety, chronic pain syndrome with opioid dependence, scoliosis    PT Comments  Pt with noted confusion, impaired processing, impaired memory, and impaired sequencing requiring maximal multimodal cues to complete tasks. Pt requiring modA for bed mobility and maxA to stand with raised surface height. Per spouse, PTA pt has been home bound because he can not do the stairs to access town home. Pt was ambulating short distance with RW however unable at this time. Pt very deconditioned and requiring significant assist. Pt to benefit from inpatient rehab program < 3 hrs a day to achieve safe supervision level of function to return home safely with spouse. Aware pt is refusing inpatient rehab at the moment. In order for patient to safely return home pt would need a caregiver that can provide moderate to maximal assist for ADLs, transfers, and short distance ambulation. Pt would need a hospital bed and would benefit from HHPT services however I strongly feel return to home in patient current condition is not safe and would have a high risk of re-admission and injury to either patient and/or caregiver. Acute PT to cont to follow.    If plan is discharge home, recommend the following: Assist for transportation;Two people to help with walking and/or transfers;Two people to help with bathing/dressing/bathroom;Help with stairs or ramp for entrance;Supervision due to cognitive status   Can travel by private vehicle        Equipment Recommendations  None recommended by PT    Recommendations for Other Services       Precautions / Restrictions Precautions Precautions:  Fall Precaution/Restrictions Comments: airborn/contact Restrictions Weight Bearing Restrictions Per Provider Order: No     Mobility  Bed Mobility Overal bed mobility: Needs Assistance Bed Mobility: Supine to Sit     Supine to sit: HOB elevated, Used rails, Mod assist     General bed mobility comments: max multimodal directional cues, modA for LE management, minA for trunk elevation, modA to scoot to EOB, pt very internally and externally distracted requiring freq redirection    Transfers Overall transfer level: Needs assistance Equipment used: Rolling walker (2 wheels) Transfers: Sit to/from Stand, Bed to chair/wheelchair/BSC Sit to Stand: From elevated surface, Max assist (bed raised very high)   Step pivot transfers: Min assist       General transfer comment: 2 attempts to stand to achieve standing, first attempt pt c/o bilat LE cramping and "ripping" in thighs. Had pt do LAQ and marching to "warm up muscles" and raised bed higher, maxA to power up to walker, once up pt minA. modA for walker management, directional verbal cues    Ambulation/Gait               General Gait Details: pt fatigues wtih step pivot to recliner, able to clear feet with 2-3 steps over to recliner   Stairs             Wheelchair Mobility     Tilt Bed    Modified Rankin (Stroke Patients Only)       Balance Overall balance assessment: Needs assistance Sitting-balance support: Feet supported Sitting balance-Leahy Scale: Fair Sitting balance - Comments: with patients scoliosis and posterior pelvic tilt with increased trunk flexion   Standing  balance support: Reliant on assistive device for balance, During functional activity, Bilateral upper extremity supported Standing balance-Leahy Scale: Poor                              Communication Communication Communication: Impaired Factors Affecting Communication: Hearing impaired  Cognition Arousal: Alert Behavior  During Therapy: WFL for tasks assessed/performed   PT - Cognitive impairments: Problem solving, Safety/Judgement, Sequencing, Attention, Initiation, Awareness                       PT - Cognition Comments: wife present and reports pt to be confused. Pt demos confusion in addition to impaired ability to sequence,  delayed response time, impaired memory, impaired comprehension of situation and tasks asked, impaired problem solving, decreased insight to safety Following commands: Intact      Cueing Cueing Techniques: Verbal cues, Gestural cues, Tactile cues  Exercises      General Comments General comments (skin integrity, edema, etc.): VSS on 2LO2 via [place]      Pertinent Vitals/Pain Pain Assessment Pain Assessment:  (pt reports chronic pain t/o body, per wife "he has chronic pain syndrome")    Home Living                          Prior Function            PT Goals (current goals can now be found in the care plan section) Acute Rehab PT Goals PT Goal Formulation: With patient Time For Goal Achievement: 09/12/23 Potential to Achieve Goals: Good Progress towards PT goals: Not progressing toward goals - comment (regression both cognitively and functionally)    Frequency    Min 3X/week      PT Plan      Co-evaluation              AM-PAC PT "6 Clicks" Mobility   Outcome Measure  Help needed turning from your back to your side while in a flat bed without using bedrails?: A Lot Help needed moving from lying on your back to sitting on the side of a flat bed without using bedrails?: A Lot Help needed moving to and from a bed to a chair (including a wheelchair)?: A Lot Help needed standing up from a chair using your arms (e.g., wheelchair or bedside chair)?: A Lot Help needed to walk in hospital room?: A Lot Help needed climbing 3-5 steps with a railing? : Total 6 Click Score: 11    End of Session Equipment Utilized During Treatment: Gait  belt Activity Tolerance: Patient tolerated treatment well;Patient limited by fatigue Patient left: in chair;with call bell/phone within reach;with chair alarm set;with family/visitor present Nurse Communication: Mobility status PT Visit Diagnosis: Unsteadiness on feet (R26.81);Muscle weakness (generalized) (M62.81)     Time: 1610-9604 PT Time Calculation (min) (ACUTE ONLY): 31 min  Charges:    $Therapeutic Activity: 23-37 mins PT General Charges $$ ACUTE PT VISIT: 1 Visit                     Brian Ruiz, PT, DPT Acute Rehabilitation Services Secure chat preferred Office #: [phone]    Brian Ruiz 09/06/2023, 1:16 PM

## 2023-09-06 NOTE — Progress Notes (Addendum)
 PHARMACY - ANTICOAGULATION CONSULT NOTE  Pharmacy Consult for heparin > switch to Eliquis Indication: chest pain/ACS and atrial fibrillation  Allergies  Allergen Reactions   Nsaids Shortness Of Breath    Other reaction(s): Bronchospasm (ALLERGY/intolerance)   Oxycodone Other (See Comments)    Agitation. Per the any pain medication that has codeine in it causes that reaction. Other reaction(s): Other Agitation. Per the any pain medication that has codeine in it causes that reaction.   Tolmetin Shortness Of Breath   Tramadol Anaphylaxis   Fentanyl Other (See Comments)   Codeine Anxiety    Other reaction(s): Bronchospasm (ALLERGY/intolerance), Other (See Comments) Irritable     Patient Measurements: Height: 6' (182.9 cm) Weight: 81.1 kg (178 lb 12.7 oz) IBW/kg (Calculated) : 77.6 HEPARIN DW (KG): 82.2  Vital Signs: Temp: 97.6 F (36.4 C) (04/09 0727) Temp Source: Axillary (04/09 0727) BP: 138/74 (04/09 0727) Pulse Rate: 67 (04/09 0727)  Labs: Recent Labs    09/04/23 0627 09/04/23 0628 09/05/23 0500 09/05/23 1400 09/06/23 0446  HGB  --   --   --   --  13.2  HCT  --   --   --   --  38.9*  PLT  --   --   --   --  179  HEPARINUNFRC  --    < > 0.70 0.63 0.36  CREATININE 2.39*  --  1.98*  --  1.73*   < > = values in this interval not displayed.    Estimated Creatinine Clearance: 39.2 mL/min (A) (by C-G formula based on SCr of 1.73 mg/dL (H)).     Medications:  Medications Prior to Admission  Medication Sig Dispense Refill Last Dose/Taking   acetaminophen (TYLENOL) 500 MG tablet Take 1,000 mg by mouth every 8 (eight) hours as needed for mild pain (pain score 1-3) or moderate pain (pain score 4-6). For pain.   Past Month   Artificial Tear Ointment (DRY EYES OP) Apply 1 drop to eye daily as needed (dry eyes).   Past Month   carvedilol (COREG) 12.5 MG tablet Take 1 tablet by mouth 2 (two) times daily.   Past Month   gabapentin (NEURONTIN) 600 MG tablet Take 600 mg by  mouth 2 (two) times daily.   Past Month   HYDROmorphone (DILAUDID) 4 MG tablet Take 1 tablet by mouth every 8 (eight) hours.   Past Month   omeprazole (PRILOSEC) 40 MG capsule Take 40 mg by mouth daily.   Past Month   senna-docusate (SENOKOT-S) 8.6-50 MG tablet Take 2 tablets by mouth 2 (two) times daily as needed for moderate constipation. 30 tablet 0 Past Month   sertraline (ZOLOFT) 25 MG tablet Take 75 mg by mouth daily.   Past Month    Assessment: 78 YO male presenting with COVID-19. On 4/3 AM, patient developed Afib with RVR, T wave abnormalities and began complaining of chest pain. Patient not on [facility] PTA. Pharmacy consulted for heparin dosing.  Heparin level 0.36 therapeutic on heparin drip rate 700 uts/hr. Running through PIV and labs drawn from PICC.  No bleeding reported. CBC stable. S/p TEE/DCCV amiodarone to po   Goal of Therapy:  Heparin level 0.3-0.7 units/ml Monitor platelets by anticoagulation protocol: Yes   Plan:  Stop IV heparin > change to Eliquis 5 mg po BID Monitor s/s bleeding  Discussed Eliquis education with patient and wife - they confirmed they can afford $500 deductible, and then $47 per month going forward.  Reece Leader, Pharm D, BCPS, BCCP  Clinical Pharmacist  09/06/2023 9:47 AM   [facility] pharmacy phone numbers are listed on [email or web address]

## 2023-09-07 DIAGNOSIS — I5043 Acute on chronic combined systolic (congestive) and diastolic (congestive) heart failure: Secondary | ICD-10-CM | POA: Diagnosis not present

## 2023-09-07 DIAGNOSIS — J9601 Acute respiratory failure with hypoxia: Secondary | ICD-10-CM | POA: Diagnosis not present

## 2023-09-07 DIAGNOSIS — I4891 Unspecified atrial fibrillation: Secondary | ICD-10-CM | POA: Diagnosis not present

## 2023-09-07 LAB — BASIC METABOLIC PANEL WITH GFR
Anion gap: 9 (ref 5–15)
BUN: 42 mg/dL — ABNORMAL HIGH (ref 8–23)
CO2: 26 mmol/L (ref 22–32)
Calcium: 8.8 mg/dL — ABNORMAL LOW (ref 8.9–10.3)
Chloride: 91 mmol/L — ABNORMAL LOW (ref 98–111)
Creatinine, Ser: 1.69 mg/dL — ABNORMAL HIGH (ref 0.61–1.24)
GFR, Estimated: 41 mL/min — ABNORMAL LOW (ref >60)
Glucose, Bld: 80 mg/dL (ref 70–99)
Potassium: 4.3 mmol/L (ref 3.5–5.1)
Sodium: 126 mmol/L — ABNORMAL LOW (ref 135–145)

## 2023-09-07 LAB — CBC
HCT: 39 % (ref 39.0–52.0)
Hemoglobin: 13.6 g/dL (ref 13.0–17.0)
MCH: 31.3 pg (ref 26.0–34.0)
MCHC: 34.9 g/dL (ref 30.0–36.0)
MCV: 89.7 fL (ref 80.0–100.0)
Platelets: 180 10*3/uL (ref 150–400)
RBC: 4.35 MIL/uL (ref 4.22–5.81)
RDW: 12.9 % (ref 11.5–15.5)
WBC: 16.7 10*3/uL — ABNORMAL HIGH (ref 4.0–10.5)
nRBC: 0 % (ref 0.0–0.2)

## 2023-09-07 LAB — COOXEMETRY PANEL
Carboxyhemoglobin: 1.6 % — ABNORMAL HIGH (ref 0.5–1.5)
Methemoglobin: <0.7 % (ref 0.0–1.5)
O2 Saturation: 60.4 %
Total hemoglobin: 13.8 g/dL (ref 12.0–16.0)

## 2023-09-07 MED ORDER — LOSARTAN POTASSIUM 25 MG PO TABS
12.5000 mg | ORAL_TABLET | Freq: Every day | ORAL | Status: DC
  Administered 2023-09-07 – 2023-09-08 (×2): 12.5 mg via ORAL
  Filled 2023-09-07 (×2): qty 1

## 2023-09-07 MED ORDER — TAMSULOSIN HCL 0.4 MG PO CAPS
0.4000 mg | ORAL_CAPSULE | Freq: Every day | ORAL | Status: DC
  Administered 2023-09-07 – 2023-09-08 (×2): 0.4 mg via ORAL
  Filled 2023-09-07 (×2): qty 1

## 2023-09-07 NOTE — Progress Notes (Addendum)
 Advanced Heart Failure Team Progress Note   Primary Physician: [facility] Cardiologist:  Parke Poisson, MD Chief Complaint: Deconditioning Interval hx:  4/7 DC-CV -->SR   Coox stable. CVP stable. Remains in NSR.  Appears close to discharge, need to figure out a dispo plan today with patient and family.    Sitting up in bed, wife at the bedside. No SOB or pain. Slept well overnight.  Objective:    Vital Signs:   Temp:  [97.5 F (36.4 C)-97.8 F (36.6 C)] 97.7 F (36.5 C) (04/10 0736) Pulse Rate:  [64-70] 70 (04/10 0736) Resp:  [14-19] 19 (04/10 0736) BP: (99-134)/(74-88) 133/88 (04/10 0736) SpO2:  [90 %-97 %] 90 % (04/10 0736) Weight:  [81.9 kg] 81.9 kg (04/10 0400) Last BM Date : 09/04/23  Weight change: Filed Weights   09/05/23 0701 09/06/23 0401 09/07/23 0400  Weight: 83.5 kg 81.1 kg 81.9 kg   Intake/Output:  Intake/Output Summary (Last 24 hours) at 09/07/2023 0902 Last data filed at 09/07/2023 0408 Gross per 24 hour  Intake 304.12 ml  Output 1100 ml  Net -795.88 ml    Physical Exam   CVP 5 General: Appears failure to thrive. On [place] Cardiac: S1 and S2 present. No murmurs or rub. Extremities: Warm and dry.  No peripheral edema.  Neuro: Alert and oriented x3. Affect pleasant. Severe generalized weakness Lines/Devices:  LUE PICC  Telemetry   SR in 60s (personally reviewed)  Labs   Basic Metabolic Panel: Recent Labs  Lab 09/02/23 0519 09/03/23 0657 09/04/23 0627 09/05/23 0500 09/06/23 0446 09/07/23 0547  NA 134* 130* 129* 128* 127* 126*  K 4.3 3.6 4.2 4.2 4.2 4.3  CL 97* 95* 96* 95* 93* 91*  CO2 21* 21* 22 23 25 26   GLUCOSE 179* 172* 104* 117* 96 80  BUN 57* 74* 69* 58* 50* 42*  CREATININE 2.10* 2.91* 2.39* 1.98* 1.73* 1.69*  CALCIUM 8.4* 8.2* 8.0* 8.5* 8.5* 8.8*  MG 2.3 2.2 2.2 2.2 2.2  --     Liver Function Tests: Recent Labs  Lab 09/01/23 1032 09/02/23 0519  AST 92* 55*  ALT 50* 44  ALKPHOS 31* 32*  BILITOT 0.8  0.8  PROT 5.9* 5.4*  ALBUMIN 3.2* 3.0*   No results for input(s): "LIPASE", "AMYLASE" in the last 168 hours. No results for input(s): "AMMONIA" in the last 168 hours.  CBC: Recent Labs  Lab 09/01/23 0438 09/01/23 1032 09/01/23 1241 09/02/23 0519 09/06/23 0446 09/07/23 0547  WBC 18.6* 21.6*  --  17.3* 15.0* 16.7*  NEUTROABS  --  18.2*  --   --   --   --   HGB 12.6* 14.4 14.6  14.6 13.8 13.2 13.6  HCT 37.4* 42.4 43.0  43.0 40.4 38.9* 39.0  MCV 93.5 91.2  --  91.6 89.8 89.7  PLT 157 207  --  207 179 180   BNP: BNP (last 3 results) Recent Labs    08/30/23 1738 08/31/23 0705  BNP 2,770.5* 1,951.7*   CBG: No results for input(s): "GLUCAP" in the last 168 hours.  Imaging   DG Chest 2 View Result Date: 09/06/2023 CLINICAL DATA:  Fever.  Hemoptysis.  COVID-19. EXAM: CHEST - 2 VIEW COMPARISON:  Chest radiograph dated 05/15/2011. FINDINGS: Mild cardiomegaly with mild central vascular congestion. Elevation of the right hemidiaphragm. There are bibasilar atelectasis. Pneumonia is not excluded. No large pleural effusion. No pneumothorax. Atherosclerotic calcification of the aorta. No acute osseous pathology. IMPRESSION: 1. Mild cardiomegaly with mild central  vascular congestion. 2. Bibasilar atelectasis. Pneumonia is not excluded. Electronically Signed   By: Elgie Collard M.D.   On: 09/06/2023 14:01   Medications:    Current Medications:  amiodarone  400 mg Oral BID   apixaban  5 mg Oral BID   Chlorhexidine Gluconate Cloth  6 each Topical Daily   dapagliflozin propanediol  10 mg Oral Daily   HYDROmorphone  4 mg Oral Q8H   pantoprazole  40 mg Oral Daily   polyethylene glycol  17 g Oral BID   senna-docusate  2 tablet Oral BID   sertraline  25 mg Oral TID   sodium chloride flush  10-40 mL Intracatheter Q12H   sodium chloride flush  3 mL Intravenous Q12H   spironolactone  25 mg Oral Daily   tamsulosin  0.4 mg Oral QPC breakfast   torsemide  20 mg Oral Daily    Infusions:  Assessment/Plan   Acute systolic CHF - presented with cardiogenic shock, now resolved.  - Etiology not certain. HS troponin > 5900. Echo with EF 20-25% and RWMA in LAD territory, severe LVH. 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 - CVP 4-5. Continue torsemide 20 mg daily  - Continue spiro 25 mg daily  - Continue Farxiga 10 mg daily - Start Losartan 12.5 mg daily - Under palliative care services at home.  2. Type 2 MI -Reported chest pain this admission. HS troponin peaked at > 5900 as above. LHC non obstructive CAD.   -Potentially ACS vs Takotsubo -Continue eliquis 5 mg twice a day   3. Atrial fibrillation  - 4/7 s/p TEE/DCCV--> NSR.   - Maintaining SR.  - Continue Eliquis 5mg  bid. Has 500.00 deductible for Eliquis. Once that is met, Co-Pay will be 47.00. He can get 30 day free CoPay card at d/c. Discussed with him and his wife they are agreeable.  - Continue amio 400 mg twice a day x 7 days then (4/15) 200 mg twice a day x 7 day , then (4/22) 200 mg daily.   4. COVID-19 infection: competed Paxlovid Acute respiratory failure: resolved  5. AKI -Scr baseline around 1 -sCr continues to improve and is down to 1.69  6. Elevated LFTs -Improving  7. Chronic pain syndrome -On dilaudid 4 mg po q8hr (home dose)  8. Memory impairment -? Dementia - Notes declining memory over several years. Watch for delirium.  9. Urinary retention - foley catheter + flomax 0.4mg  daily  Concerns about safety due to mentation and debility with discharge to home. See PT/OT notes. Will re-discuss again with patient and wife today about considering other options, such as SNF. Patients wife is his current care giver and not able to physically assist with transfers. Currently receiving Palliative Care at home.   Length of Stay: 24  [place] Lee, NP  09/07/2023, 9:02  AM  Advanced Heart Failure Team Pager [phone] (M-F; 7a - 5p)  Please contact [facility] for night-coverage after hours (4p -7a ) and weekends on [email or web address]   Patient seen with NP, I formulated the plan and agree with the above note.   CVP 7-8 on my read, co-ox 60%.  He remains in NSR.   General: NAD Neck: No JVD, no thyromegaly or thyroid nodule.  Lungs: Clear to auscultation bilaterally with normal respiratory effort. CV: Nondisplaced PMI.  Heart regular S1/S2, no S3/S4, no murmur.  No peripheral edema.  Abdomen: Soft, nontender, no hepatosplenomegaly, no distention.  Skin: Intact without lesions or rashes.  Neurologic: Alert and oriented x 3.  Psych: Normal affect. Extremities: No clubbing or cyanosis.  HEENT: Normal.   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 ?Stress (Takotsubo-type) cardiomyopathy vs tachy-mediated CMP. TEE 4/7 showed LV EF 25-30% with apical/peri-apical akinesis, mild RV dysfunction, moderate AI. He is now back in NSR.  Looks euvolemic with CVP 7-8 and co-ox 60% off milrinone. Creatinine trending down, 2.39 => 1.98 => 1.73 => 1.69 today. GDMT limited by AKI.  - Continue torsemide 20 mg daily.  - Continue Farxiga 10 mg daily.  - Increase spironolactone to 25 mg daily.  - Add losartan 12.5 daily.  - Need to keep in NSR.    Persistent atrial fibrillation, now s/p DCCV.  He remains in NSR today.  He is on amiodarone po and heparin gtt.  - Continue amiodarone 400 mg bid, taper at discharge. - Continue Eliquis   Frail, limited to short distances by walker at baseline.  Suspect he would benefit from rehab prior to returning home.  I think he is ready for discharge but ideally to rehab.   Marca Ancona 09/07/2023 11:29 AM

## 2023-09-07 NOTE — TOC Progression Note (Signed)
 Transition of Care [facility]) - Progression Note    Patient Details  Name: Brian Ruiz MRN: [id number] Date of Birth: 10/15/1945  Transition of Care [facility]) CM/SW Contact  Nicanor Bake Phone Number: [phone] 09/07/2023, 12:10 PM  Clinical Narrative:   11:43 AM: HF CSW spoke with he patients wife about the SNF consult. CSW explained the SNF process. Patients wife was agreeable to being faxed out.   FL2 completed and SNF offers pending.   TOC will continue following.     Expected Discharge Plan: Home w Home Health Services Barriers to Discharge: Continued Medical Work up  Expected Discharge Plan and Services In-house Referral: Clinical Social Work Discharge Planning Services: CM Consult Post Acute Care Choice: Home Health Living arrangements for the past 2 months: Single Family Home                 DME Arranged: N/A DME Agency: NA       HH Arranged: PT, OT HH Agency: [facility] Date HH Agency Contacted: 09/04/23 Time HH Agency Contacted: 1212 Representative spoke with at [facility] Agency: Kandee Keen   Social Determinants of Health (SDOH) Interventions SDOH Screenings   Food Insecurity: No Food Insecurity (08/28/2023)  Housing: Low Risk  (08/28/2023)  Transportation Needs: No Transportation Needs (08/28/2023)  Utilities: Not At Risk (08/28/2023)  Financial Resource Strain: Low Risk  (02/08/2021)   Received from [facility], [facility]  Physical Activity: Inactive (02/08/2021)   Received from [facility], [facility]  Social Connections: Socially Integrated (08/28/2023)  Stress: No Stress Concern Present (02/08/2021)   Received from [facility], [facility]  Tobacco Use: Low Risk  (08/28/2023)    Readmission Risk Interventions    08/30/2023   12:30 PM  Readmission Risk Prevention Plan  Post Dischage Appt Complete  Medication Screening Complete  Transportation Screening Complete

## 2023-09-07 NOTE — Progress Notes (Addendum)
 PROGRESS NOTE    ELFORD EVILSIZER  ZOX:[id number] DOB: 03-02-1946 DOA: 08/28/2023 PCP: [facility]    78/M w HTN, depression/anxiety, chronic pain syndrome with opioid dependence who presented to [facility] on 3/31 with complaints of generalized weakness and shortness of breath.In ED, Hypoxic,  positive for COVID-19 admitted  3/31: Admit to [facility], COVID viral pneumonia 4/3: A-fib with RVR, elevated troponin 5919 w/ concern for NSTEMI; started on amiodarone/heparin drip; transfer to [facility] 2D echo noted EF 20-25% with R WMA in LAD territory, severe LVH with concern for low output 4/4: s/p Cardiac catheterization, transferring to 2H> cardiogenic shock -Started on milrinone -RHC noted normal filling pressures, milrinone discontinued 4/7  Subjective: -Urinary retention early this morning  Assessment and Plan:   Acute respiratory failure with hypoxia (HCC) -Multifactorial secondary to CHF and COVID-pneumonia Improving, continue to wean O2 -PT reeval completed, SNF recommended, TOC consulted  Acute systolic CHF with low output Cardiogenic shock -Echo this admission noted EF 20-25% with R WMA in LAD territory, severe LVH -LHC with no obstructive CAD, RHC with normal filling pressures -Milrinone discontinued 4/7 -Now switched to oral torsemide, continue Farxiga, Aldactone -Discharge planning  NSTEMI, type II MI -Troponin peaked at 5900, 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 -LHC 4/4 with no obstructive CAD -Continue aspirin  Paroxysmal atrial fibrillation -Underwent TEE/DCCV 4/7 -Remains in sinus rhythm, now switched to oral amiodarone, changed to Eliquis  AKI -Baseline creatinine 1, creatinine peaked at 2.9, now improving  Urinary retention/BPH -Foley removed yesterday morning, had retention overnight required I/O cath at 2 AM, add Flomax, Foley catheter replaced today for recurrent retention -Urology follow-up and voiding trial at SNF  Chronic  pain Narcotic dependence -On oral Dilaudid at baseline  Memory loss, early dementia -Monitor for delirium  COVID-19 infection -Improved, completed 5-day course of Paxlovid -Discontinue IV Decadron  Abnormal LFTs -Likely from passive hepatic congestion, improving   DVT prophylaxis: Eliquis Code Status: DNR Family Communication: none present Disposition Plan: Home vs SNF  Consultants:    Procedures:   Antimicrobials:    Objective: Vitals:   09/06/23 1856 09/06/23 2250 09/07/23 0400 09/07/23 0736  BP: 124/81 101/85 99/74 133/88  Pulse: 68 68 64 70  Resp: 17 16 14 19   Temp: (!) 97.5 F (36.4 C) (!) 97.5 F (36.4 C) 97.6 F (36.4 C) 97.7 F (36.5 C)  TempSrc: Oral Oral Oral Oral  SpO2: 96% 97% 91% 90%  Weight:   81.9 kg   Height:        Intake/Output Summary (Last 24 hours) at 09/07/2023 1002 Last data filed at 09/07/2023 0935 Gross per 24 hour  Intake 314.12 ml  Output 1100 ml  Net -785.88 ml   Filed Weights   09/05/23 0701 09/06/23 0401 09/07/23 0400  Weight: 83.5 kg 81.1 kg 81.9 kg    Examination:  Gen: Awake, Alert, Oriented X 3,  HEENT: no JVD Lungs: Good air movement bilaterally, CTAB CVS: S1S2/RRR Abd: soft, Non tender, non distended, BS present Extremities: No edema Skin: no new rashes on exposed skin     Data Reviewed:   CBC: Recent Labs  Lab 09/01/23 0438 09/01/23 1032 09/01/23 1241 09/02/23 0519 09/06/23 0446 09/07/23 0547  WBC 18.6* 21.6*  --  17.3* 15.0* 16.7*  NEUTROABS  --  18.2*  --   --   --   --   HGB 12.6* 14.4 14.6  14.6 13.8 13.2 13.6  HCT 37.4* 42.4 43.0  43.0 40.4 38.9* 39.0  MCV 93.5  91.2  --  91.6 89.8 89.7  PLT 157 207  --  207 179 180   Basic Metabolic Panel: Recent Labs  Lab 09/02/23 0519 09/03/23 0657 09/04/23 0627 09/05/23 0500 09/06/23 0446 09/07/23 0547  NA 134* 130* 129* 128* 127* 126*  K 4.3 3.6 4.2 4.2 4.2 4.3  CL 97* 95* 96* 95* 93* 91*  CO2 21* 21* 22 23 25 26   GLUCOSE 179* 172* 104*  117* 96 80  BUN 57* 74* 69* 58* 50* 42*  CREATININE 2.10* 2.91* 2.39* 1.98* 1.73* 1.69*  CALCIUM 8.4* 8.2* 8.0* 8.5* 8.5* 8.8*  MG 2.3 2.2 2.2 2.2 2.2  --    GFR: Estimated Creatinine Clearance: 40.2 mL/min (A) (by C-G formula based on SCr of 1.69 mg/dL (H)). Liver Function Tests: Recent Labs  Lab 09/01/23 1032 09/02/23 0519  AST 92* 55*  ALT 50* 44  ALKPHOS 31* 32*  BILITOT 0.8 0.8  PROT 5.9* 5.4*  ALBUMIN 3.2* 3.0*   No results for input(s): "LIPASE", "AMYLASE" in the last 168 hours. No results for input(s): "AMMONIA" in the last 168 hours. Coagulation Profile: No results for input(s): "INR", "PROTIME" in the last 168 hours. Cardiac Enzymes: No results for input(s): "CKTOTAL", "CKMB", "CKMBINDEX", "TROPONINI" in the last 168 hours. BNP (last 3 results) No results for input(s): "PROBNP" in the last 8760 hours. HbA1C: No results for input(s): "HGBA1C" in the last 72 hours. CBG: No results for input(s): "GLUCAP" in the last 168 hours. Lipid Profile: No results for input(s): "CHOL", "HDL", "LDLCALC", "TRIG", "CHOLHDL", "LDLDIRECT" in the last 72 hours. Thyroid Function Tests: No results for input(s): "TSH", "T4TOTAL", "FREET4", "T3FREE", "THYROIDAB" in the last 72 hours. Anemia Panel: No results for input(s): "VITAMINB12", "FOLATE", "FERRITIN", "TIBC", "IRON", "RETICCTPCT" in the last 72 hours. Urine analysis:    Component Value Date/Time   COLORURINE YELLOW 11/11/2020 0041   APPEARANCEUR CLEAR 11/11/2020 0041   LABSPEC 1.018 11/11/2020 0041   PHURINE 6.0 11/11/2020 0041   GLUCOSEU NEGATIVE 11/11/2020 0041   HGBUR SMALL (A) 11/11/2020 0041   BILIRUBINUR NEGATIVE 11/11/2020 0041   KETONESUR NEGATIVE 11/11/2020 0041   PROTEINUR NEGATIVE 11/11/2020 0041   UROBILINOGEN 0.2 05/15/2011 1151   NITRITE NEGATIVE 11/11/2020 0041   LEUKOCYTESUR NEGATIVE 11/11/2020 0041   Sepsis Labs: @LABRCNTIP (procalcitonin:4,lacticidven:4)  ) Recent Results (from the past 240 hours)   Blood Culture (routine x 2)     Status: None   Collection Time: 08/28/23  4:15 PM   Specimen: BLOOD  Result Value Ref Range Status   Specimen Description   Final    BLOOD LEFT ANTECUBITAL Performed at [facility], [address], [place], [place] [address]    Special Requests   Final    BOTTLES DRAWN AEROBIC AND ANAEROBIC Blood Culture results may not be optimal due to an inadequate volume of blood received in culture bottles Performed at [facility], [address] [place], [place] [address]    Culture   Final    NO GROWTH 5 DAYS Performed at [facility], [address], [place], [place] [address]    Report Status 09/02/2023 FINAL  Final  Blood Culture (routine x 2)     Status: None   Collection Time: 08/28/23  4:18 PM   Specimen: BLOOD LEFT WRIST  Result Value Ref Range Status   Specimen Description   Final    BLOOD LEFT WRIST Performed at [facility], [address] [place], [place] [address]  Special Requests   Final    BOTTLES DRAWN AEROBIC AND ANAEROBIC Blood Culture results may not be optimal due to an inadequate volume of blood received in culture bottles Performed at [facility] [address] [place], [place] [address]    Culture   Final    NO GROWTH 5 DAYS Performed at [facility], [address] [place], [place] [address]    Report Status 09/02/2023 FINAL  Final  Resp panel by RT-PCR (RSV, Flu A&B, Covid) Anterior Nasal Swab     Status: Abnormal   Collection Time: 08/28/23  5:04 PM   Specimen: Anterior Nasal Swab  Result Value Ref Range Status   SARS Coronavirus 2 by RT PCR POSITIVE (A) NEGATIVE Final    Comment: (NOTE) SARS-CoV-2 target nucleic acids are DETECTED.  The SARS-CoV-2 RNA is generally detectable in upper respiratory specimens during the acute phase of infection. Positive results are indicative of the presence of the identified virus, but do not rule out  bacterial infection or co-infection with other pathogens not detected by the test. Clinical correlation with patient history and other diagnostic information is necessary to determine patient infection status. The expected result is Negative.  Fact Sheet for Patients: BloggerCourse.com  Fact Sheet for Healthcare Providers: SeriousBroker.it  This test is not yet approved or cleared by the [place] FDA and  has been authorized for detection and/or diagnosis of SARS-CoV-2 by FDA under an Emergency Use Authorization (EUA).  This EUA will remain in effect (meaning this test can be used) for the duration of  the COVID-19 declaration under Section 564(b)(1) of the A ct, 21 U.S.C. section 360bbb-3(b)(1), unless the authorization is terminated or revoked sooner.     Influenza A by PCR NEGATIVE NEGATIVE Final   Influenza B by PCR NEGATIVE NEGATIVE Final    Comment: (NOTE) The Xpert Xpress SARS-CoV-2/FLU/RSV plus assay is intended as an aid in the diagnosis of influenza from Nasopharyngeal swab specimens and should not be used as a sole basis for treatment. Nasal washings and aspirates are unacceptable for Xpert Xpress SARS-CoV-2/FLU/RSV testing.  Fact Sheet for Patients: BloggerCourse.com  Fact Sheet for Healthcare Providers: SeriousBroker.it  This test is not yet approved or cleared by the [place] FDA and has been authorized for detection and/or diagnosis of SARS-CoV-2 by FDA under an Emergency Use Authorization (EUA). This EUA will remain in effect (meaning this test can be used) for the duration of the COVID-19 declaration under Section 564(b)(1) of the Act, 21 U.S.C. section 360bbb-3(b)(1), unless the authorization is terminated or revoked.     Resp Syncytial Virus by PCR NEGATIVE NEGATIVE Final    Comment: (NOTE) Fact Sheet for  Patients: BloggerCourse.com  Fact Sheet for Healthcare Providers: SeriousBroker.it  This test is not yet approved or cleared by the [place] FDA and has been authorized for detection and/or diagnosis of SARS-CoV-2 by FDA under an Emergency Use Authorization (EUA). This EUA will remain in effect (meaning this test can be used) for the duration of the COVID-19 declaration under Section 564(b)(1) of the Act, 21 U.S.C. section 360bbb-3(b)(1), unless the authorization is terminated or revoked.  Performed at [facility], [address], [place], [place] [address]   MRSA Next Gen by PCR, Nasal     Status: None   Collection Time: 08/31/23  9:20 AM   Specimen: Nasal Mucosa; Nasal Swab  Result Value Ref Range Status   MRSA by PCR Next Gen NOT DETECTED NOT DETECTED  Final    Comment: (NOTE) The GeneXpert MRSA Assay (FDA approved for NASAL specimens only), is one component of a comprehensive MRSA colonization surveillance program. It is not intended to diagnose MRSA infection nor to guide or monitor treatment for MRSA infections. Test performance is not FDA approved in patients less than 37 years old. Performed at [facility], [address], [place], [place] [address]      Radiology Studies: DG Chest 2 View Result Date: 09/06/2023 CLINICAL DATA:  Fever.  Hemoptysis.  COVID-19. EXAM: CHEST - 2 VIEW COMPARISON:  Chest radiograph dated 05/15/2011. FINDINGS: Mild cardiomegaly with mild central vascular congestion. Elevation of the right hemidiaphragm. There are bibasilar atelectasis. Pneumonia is not excluded. No large pleural effusion. No pneumothorax. Atherosclerotic calcification of the aorta. No acute osseous pathology. IMPRESSION: 1. Mild cardiomegaly with mild central vascular congestion. 2. Bibasilar atelectasis. Pneumonia is not excluded. Electronically Signed   By: Elgie Collard M.D.   On:  09/06/2023 14:01     Scheduled Meds:  amiodarone  400 mg Oral BID   apixaban  5 mg Oral BID   Chlorhexidine Gluconate Cloth  6 each Topical Daily   dapagliflozin propanediol  10 mg Oral Daily   HYDROmorphone  4 mg Oral Q8H   losartan  12.5 mg Oral Daily   pantoprazole  40 mg Oral Daily   polyethylene glycol  17 g Oral BID   senna-docusate  2 tablet Oral BID   sertraline  25 mg Oral TID   sodium chloride flush  10-40 mL Intracatheter Q12H   sodium chloride flush  3 mL Intravenous Q12H   spironolactone  25 mg Oral Daily   tamsulosin  0.4 mg Oral QPC breakfast   torsemide  20 mg Oral Daily   Continuous Infusions:     LOS: 9 days    Time spent:    Zannie Cove, MD [facility]   09/07/2023, 10:02 AM

## 2023-09-07 NOTE — Discharge Instructions (Signed)

## 2023-09-07 NOTE — NC FL2 (Signed)
 [place] MEDICAID FL2 LEVEL OF CARE FORM     IDENTIFICATION  Patient Name: Brian Ruiz Birthdate: 10/27/1945 Sex: male Admission Date (Current Location): 08/28/2023  [facility] and [place] Number:  Producer, television/film/video and Address:  [facility] [place], [address], [place], [place] [address]      Provider Number: [id number]  Attending Physician Name and Address:  Zannie Cove, MD  Relative Name and Phone Number:  Imad, Shostak) Spouse [phone]  (C) [phone]    Current Level of Care: Hospital Recommended Level of Care: Skilled Nursing Facility Prior Approval Number:    Date Approved/Denied:   PASRR Number: [id number]  Discharge Plan: SNF    Current Diagnoses: Patient Active Problem List   Diagnosis Date Noted   NSTEMI (non-ST elevated myocardial infarction) (HCC) 08/31/2023   Acute metabolic encephalopathy 08/30/2023   Acute respiratory failure with hypoxia (HCC) 08/28/2023   COVID-19 virus infection 08/28/2023   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   Depression with anxiety 09/12/2021   Palliative care encounter 08/03/2021   Physical debility 08/03/2021   Severe protein-calorie malnutrition (HCC) 08/03/2021   Acute on chronic combined systolic and diastolic CHF (congestive heart failure) (HCC) 05/16/2018   Chronic fatigue 06/14/2017   Palpitations 06/14/2017   Aortic ectasia (HCC) 11/15/2016   Age-related osteoporosis without current pathological fracture 10/27/2016   Screening for AAA (abdominal aortic aneurysm) 10/27/2016   Screening for prostate cancer 10/27/2016   Need for hepatitis C screening test 10/27/2016   Encounter for general adult medical examination without abnormal findings 11/26/2013   Screening for osteoporosis 11/26/2013   Kyphoscoliosis deformity of spine 07/04/2013   Enlarged prostate with lower urinary tract symptoms (LUTS) 04/12/2013   Screen for colon cancer 04/12/2013   Undiagnosed  cardiac murmurs 04/12/2013   Backache 04/12/2013   White coat syndrome with hypertension 04/12/2013   Lumbar spinal stenosis 10/29/2012   DDD (degenerative disc disease), lumbosacral 03/16/2012   Lumbosacral radiculopathy 03/16/2012   Generalized anxiety disorder 03/14/2012   Renal pain 12/16/2011   Sacroiliitis, not elsewhere classified (HCC) 08/11/2011   Sacroiliac joint pain 07/27/2011   Bilateral hip pain 07/07/2011   Chronic pain syndrome 07/07/2011   Spondylolisthesis of lumbosacral region 07/07/2011   Hereditary and idiopathic peripheral neuropathy 05/27/2011   Accidental drug overdose 05/15/2011   Guilty feelings 05/15/2011   Essential hypertension 05/15/2011   Hypotension 05/15/2011   Bradycardia 05/15/2011   Chronic back pain 05/15/2011   Vitamin D deficiency 03/08/2011    Orientation RESPIRATION BLADDER Height & Weight     Self, Time, Situation, Place  O2 Continent Weight: 180 lb 8.9 oz (81.9 kg) Height:  6' (182.9 cm)  BEHAVIORAL SYMPTOMS/MOOD NEUROLOGICAL BOWEL NUTRITION STATUS      Continent Diet (See dc summary)  AMBULATORY STATUS COMMUNICATION OF NEEDS Skin   Extensive Assist Verbally Normal                       Personal Care Assistance Level of Assistance  Bathing, Feeding, Dressing, Total care Bathing Assistance: Maximum assistance Feeding assistance: Limited assistance Dressing Assistance: Maximum assistance Total Care Assistance: Maximum assistance   Functional Limitations Info  Sight, Hearing, Speech Sight Info: Impaired Hearing Info: Impaired Speech Info: Adequate    SPECIAL CARE FACTORS FREQUENCY  PT (By licensed PT), OT (By licensed OT)     PT Frequency: 5x weekly OT Frequency: 5x weekly            Contractures  Additional Factors Info  Code Status, Allergies, Isolation Precautions Code Status Info: DNR-limited Allergies Info: Nsaids;Oxycodone;Tolmetin;Tramadol;Fentanyl;Codeine     Isolation Precautions Info:  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)     Current Medications (09/07/2023):  This is the current hospital active medication list Current Facility-Administered Medications  Medication Dose Route Frequency Provider Last Rate Last Admin   acetaminophen (TYLENOL) tablet 650 mg  650 mg Oral Q6H PRN Charlsie Quest, MD   650 mg at 09/02/23 2120   Or   acetaminophen (TYLENOL) suppository 650 mg  650 mg Rectal Q6H PRN Charlsie Quest, MD       amiodarone (PACERONE) tablet 400 mg  400 mg Oral BID Laurey Morale, MD   400 mg at 09/07/23 0932   apixaban (ELIQUIS) tablet 5 mg  5 mg Oral BID Clegg, Amy D, NP   5 mg at 09/07/23 8295   Chlorhexidine Gluconate Cloth 2 % PADS 6 each  6 each Topical Daily Shalhoub, Deno Lunger, MD   6 each at 09/07/23 0935   dapagliflozin propanediol (FARXIGA) tablet 10 mg  10 mg Oral Daily Laurey Morale, MD   10 mg at 09/07/23 0933   gabapentin (NEURONTIN) capsule 600 mg  600 mg Oral BID PRN Marinda Elk, MD   600 mg at 09/02/23 2120   guaiFENesin (MUCINEX) 12 hr tablet 600 mg  600 mg Oral BID PRN Charlsie Quest, MD   600 mg at 08/30/23 2153   HYDROmorphone (DILAUDID) tablet 4 mg  4 mg Oral Q8H Brynda Peon L, NP   4 mg at 09/07/23 0934   ipratropium-albuterol (DUONEB) 0.5-2.5 (3) MG/3ML nebulizer solution 3 mL  3 mL Nebulization Q4H PRN Marinda Elk, MD   3 mL at 08/31/23 0802   losartan (COZAAR) tablet 12.5 mg  12.5 mg Oral Daily Lee, [place], NP   12.5 mg at 09/07/23 6213   menthol-cetylpyridinium (CEPACOL) lozenge 3 mg  1 lozenge Oral PRN Marinda Elk, MD   3 mg at 09/03/23 1509   nitroGLYCERIN (NITROSTAT) SL tablet 0.4 mg  0.4 mg Sublingual Q5 min PRN Anthoney Harada, NP       ondansetron (ZOFRAN) tablet 4 mg  4 mg Oral Q6H PRN Charlsie Quest, MD       Or   ondansetron (ZOFRAN) injection 4 mg  4 mg Intravenous Q6H PRN Charlsie Quest, MD       Oral care mouth rinse  15 mL Mouth Rinse PRN Luiz Iron, NP        pantoprazole (PROTONIX) EC tablet 40 mg  40 mg Oral Daily Marinda Elk, MD   40 mg at 09/07/23 0933   polyethylene glycol (MIRALAX / GLYCOLAX) packet 17 g  17 g Oral BID Laurey Morale, MD   17 g at 09/07/23 0932   senna-docusate (Senokot-S) tablet 2 tablet  2 tablet Oral BID Laurey Morale, MD   2 tablet at 09/07/23 0933   sertraline (ZOLOFT) tablet 25 mg  25 mg Oral TID Danford Bad, RPH   25 mg at 09/07/23 0934   sodium chloride flush (NS) 0.9 % injection 10-40 mL  10-40 mL Intracatheter Q12H [place], Alvira Philips, DO   10 mL at 09/07/23 0935   sodium chloride flush (NS) 0.9 % injection 10-40 mL  10-40 mL Intracatheter PRN [place], Alvira Philips, DO       sodium chloride flush (NS) 0.9 % injection 3 mL  3 mL Intravenous Q12H Allena Katz,  Vishal R, MD   3 mL at 09/07/23 0935   spironolactone (ALDACTONE) tablet 25 mg  25 mg Oral Daily Clegg, Amy D, NP   25 mg at 09/07/23 0932   tamsulosin (FLOMAX) capsule 0.4 mg  0.4 mg Oral QPC breakfast Zannie Cove, MD   0.4 mg at 09/07/23 0932   torsemide (DEMADEX) tablet 20 mg  20 mg Oral Daily Laurey Morale, MD   20 mg at 09/07/23 4098     Discharge Medications: Please see discharge summary for a list of discharge medications.  Relevant Imaging Results:  Relevant Lab Results:   Additional Information SSN: [id number]  Reva Bores, LCSWA

## 2023-09-07 NOTE — Progress Notes (Signed)
 Mobility Specialist Progress Note;   09/07/23 0915  Mobility  Activity  (Bed mobility)  Range of Motion/Exercises Active Assistive;Right leg;Left leg  Activity Response Tolerated well  Mobility Referral Yes  Mobility visit 1 Mobility  Mobility Specialist Start Time (ACUTE ONLY) 0915  Mobility Specialist Stop Time (ACUTE ONLY) 0924  Mobility Specialist Time Calculation (min) (ACUTE ONLY) 9 min   RN requested not to transfer pt today d/t weakness, however believed pt would benefit from bed level exercises. Pt eager for exercises. Actively assisted pt in BLE exercises. Pt states it relieved his back pain. VSS throughout. Pt left in bed with all needs met. RN notified.   Brian Ruiz Mobility Specialist Please contact via SecureChat or [employer] [phone]

## 2023-09-08 DIAGNOSIS — J9601 Acute respiratory failure with hypoxia: Secondary | ICD-10-CM | POA: Diagnosis not present

## 2023-09-08 DIAGNOSIS — I4891 Unspecified atrial fibrillation: Secondary | ICD-10-CM | POA: Diagnosis not present

## 2023-09-08 DIAGNOSIS — I5043 Acute on chronic combined systolic (congestive) and diastolic (congestive) heart failure: Secondary | ICD-10-CM | POA: Diagnosis not present

## 2023-09-08 LAB — BASIC METABOLIC PANEL WITH GFR
Anion gap: 12 (ref 5–15)
BUN: 44 mg/dL — ABNORMAL HIGH (ref 8–23)
CO2: 24 mmol/L (ref 22–32)
Calcium: 8.6 mg/dL — ABNORMAL LOW (ref 8.9–10.3)
Chloride: 90 mmol/L — ABNORMAL LOW (ref 98–111)
Creatinine, Ser: 1.87 mg/dL — ABNORMAL HIGH (ref 0.61–1.24)
GFR, Estimated: 37 mL/min — ABNORMAL LOW (ref >60)
Glucose, Bld: 89 mg/dL (ref 70–99)
Potassium: 4.3 mmol/L (ref 3.5–5.1)
Sodium: 126 mmol/L — ABNORMAL LOW (ref 135–145)

## 2023-09-08 LAB — COOXEMETRY PANEL
Carboxyhemoglobin: 2.1 % — ABNORMAL HIGH (ref 0.5–1.5)
Methemoglobin: <0.7 % (ref 0.0–1.5)
O2 Saturation: 73.9 %
Total hemoglobin: 12.8 g/dL (ref 12.0–16.0)

## 2023-09-08 LAB — CBC
HCT: 38.2 % — ABNORMAL LOW (ref 39.0–52.0)
Hemoglobin: 12.6 g/dL — ABNORMAL LOW (ref 13.0–17.0)
MCH: 30.1 pg (ref 26.0–34.0)
MCHC: 33 g/dL (ref 30.0–36.0)
MCV: 91.2 fL (ref 80.0–100.0)
Platelets: 185 10*3/uL (ref 150–400)
RBC: 4.19 MIL/uL — ABNORMAL LOW (ref 4.22–5.81)
RDW: 13 % (ref 11.5–15.5)
WBC: 14.3 10*3/uL — ABNORMAL HIGH (ref 4.0–10.5)
nRBC: 0 % (ref 0.0–0.2)

## 2023-09-08 MED ORDER — TAMSULOSIN HCL 0.4 MG PO CAPS: 0.4000 mg | ORAL_CAPSULE | Freq: Every day | ORAL | Status: DC

## 2023-09-08 MED ORDER — TORSEMIDE 20 MG PO TABS: 20.0000 mg | ORAL_TABLET | Freq: Every day | ORAL | Status: DC

## 2023-09-08 MED ORDER — DAPAGLIFLOZIN PROPANEDIOL 10 MG PO TABS: 10.0000 mg | ORAL_TABLET | Freq: Every day | ORAL | Status: DC

## 2023-09-08 MED ORDER — AMIODARONE HCL 400 MG PO TABS: ORAL_TABLET | ORAL | Status: DC

## 2023-09-08 MED ORDER — APIXABAN 5 MG PO TABS: 5.0000 mg | ORAL_TABLET | Freq: Two times a day (BID) | ORAL | Status: DC

## 2023-09-08 MED ORDER — SPIRONOLACTONE 25 MG PO TABS: 25.0000 mg | ORAL_TABLET | Freq: Every day | ORAL | Status: DC

## 2023-09-08 MED ORDER — GABAPENTIN 400 MG PO TABS: 400.0000 mg | ORAL_TABLET | Freq: Every day | ORAL | Status: DC

## 2023-09-08 MED ORDER — HYDROMORPHONE HCL 4 MG PO TABS: 4.0000 mg | ORAL_TABLET | Freq: Three times a day (TID) | ORAL | 0 refills | Status: DC

## 2023-09-08 NOTE — Progress Notes (Signed)
 Physical Therapy Treatment Patient Details Name: Brian Ruiz MRN: [id number] DOB: 07/18/1945 Today's Date: 09/08/2023   History of Present Illness Brian Ruiz is a 78 y.o. male presents with SOB; admitted with acute respiratory failure with hypoxia, positive for covid19. Underwent cardioversion on 4/7. PMH: HTN, depression/anxiety, chronic pain syndrome with opioid dependence, scoliosis    PT Comments  Pt in bed upon arrival and agreeable to PT session. Worked on transfers and LE strength in today's session. Pt fatigues easily and required ModAx2 to stand from the EOB with a RW. When attempting second stand, pt was unable to maintain an upright posture and needed more physical assist. Pt also had increased difficulty lifting LLE during exercises. Pt is progressing towards goals. Pt would continue to benefit from <3hrs post acute rehab. Acute PT to follow.      If plan is discharge home, recommend the following: Assist for transportation;Two people to help with walking and/or transfers;Two people to help with bathing/dressing/bathroom;Help with stairs or ramp for entrance;Supervision due to cognitive status   Can travel by private vehicle     No  Equipment Recommendations  None recommended by PT       Precautions / Restrictions Precautions Precautions: Fall Recall of Precautions/Restrictions: Impaired Precaution/Restrictions Comments: airborn/contact Restrictions Weight Bearing Restrictions Per Provider Order: No     Mobility  Bed Mobility Overal bed mobility: Needs Assistance Bed Mobility: Supine to Sit, Sit to Supine    Supine to sit: HOB elevated, Used rails, Mod assist Sit to supine: Mod assist, HOB elevated   General bed mobility comments: ModA to finish bringing LE's off EOB and raise trunk. Multiple cues for sequencing and technique. ModA to return to bed for LE mangement    Transfers Overall transfer level: Needs assistance Equipment used: Rolling walker (2  wheels) Transfers: Sit to/from Stand Sit to Stand: Mod assist, +2 physical assistance, +2 safety/equipment, Max assist      General transfer comment: ModAx2 to boost-up and steady, cues for hand placement with poor follow-through. MaxAx2 for second stand with pt unable to maintain upright posture    Ambulation/Gait     General Gait Details: unable this date      Balance Overall balance assessment: Needs assistance Sitting-balance support: Feet supported Sitting balance-Leahy Scale: Fair     Standing balance support: Reliant on assistive device for balance Standing balance-Leahy Scale: Poor       Communication Communication Communication: No apparent difficulties Factors Affecting Communication: Hearing impaired  Cognition Arousal: Alert Behavior During Therapy: WFL for tasks assessed/performed   PT - Cognitive impairments: Problem solving, Safety/Judgement, Sequencing, Attention, Initiation, Awareness  Following commands: Intact      Cueing Cueing Techniques: Verbal cues, Gestural cues, Tactile cues  Exercises General Exercises - Lower Extremity Ankle Circles/Pumps: AROM, Both, 10 reps, Supine Quad Sets: AROM, Both, 5 reps, Supine Straight Leg Raises: AROM, Both, 5 reps, Supine Other Exercises Other Exercises: x2 STS with increased time and frequent cues        Pertinent Vitals/Pain Pain Assessment Pain Assessment: Faces Faces Pain Scale: Hurts little more Pain Location: generalized Pain Descriptors / Indicators: Aching, Discomfort Pain Intervention(s): Limited activity within patient's tolerance, Monitored during session, Repositioned     PT Goals (current goals can now be found in the care plan section) Acute Rehab PT Goals PT Goal Formulation: With patient Time For Goal Achievement: 09/12/23 Potential to Achieve Goals: Good Progress towards PT goals: Progressing toward goals    Frequency    Min  2X/week       AM-PAC PT "6 Clicks" Mobility    Outcome Measure  Help needed turning from your back to your side while in a flat bed without using bedrails?: A Lot Help needed moving from lying on your back to sitting on the side of a flat bed without using bedrails?: A Lot Help needed moving to and from a bed to a chair (including a wheelchair)?: Total Help needed standing up from a chair using your arms (e.g., wheelchair or bedside chair)?: Total Help needed to walk in hospital room?: Total Help needed climbing 3-5 steps with a railing? : Total 6 Click Score: 8    End of Session Equipment Utilized During Treatment: Gait belt Activity Tolerance: Patient tolerated treatment well;Patient limited by fatigue Patient left: in bed;with call bell/phone within reach Nurse Communication: Mobility status PT Visit Diagnosis: Unsteadiness on feet (R26.81);Muscle weakness (generalized) (M62.81)     Time: 1610-9604 PT Time Calculation (min) (ACUTE ONLY): 23 min  Charges:    $Therapeutic Exercise: 8-22 mins $Therapeutic Activity: 8-22 mins PT General Charges $$ ACUTE PT VISIT: 1 Visit                     Hilton Cork, PT, DPT Secure Chat Preferred  Rehab Office [phone]   Arturo Morton Brion Aliment 09/08/2023, 3:16 PM

## 2023-09-08 NOTE — Discharge Summary (Addendum)
 Physician Discharge Summary  Brian Ruiz ZOX:[id number] DOB: 1945-12-09 DOA: 08/28/2023  PCP: [facility]  Admit date: 08/28/2023 Discharge date: 09/08/2023  Time spent: 45 minutes  Recommendations for Outpatient Follow-up:  SNF for short-term rehab Outpatient palliative care Follow-up with advanced heart failure clinic Routine Foley catheter care, attempt voiding trial in 1 week, and urology follow-up in few weeks  Discharge Diagnoses:  Principal Problem:   Acute respiratory failure with hypoxia (HCC)   COVID-19 virus infection   Essential hypertension   Chronic back pain   Acute on chronic combined systolic and diastolic CHF (congestive heart failure) (HCC)   Cardiogenic shock   Urinary retention/BPH   Depression with anxiety   Acute metabolic encephalopathy   NSTEMI (non-ST elevated myocardial infarction) [facility])   Discharge Condition: Improved  Diet recommendation: Low-sodium, heart healthy  Filed Weights   09/06/23 0401 09/07/23 0400 09/08/23 0516  Weight: 81.1 kg 81.9 kg 83.9 kg    History of present illness:   77/M w HTN, depression/anxiety, chronic pain syndrome with opioid dependence who presented to [facility] on 3/31 with complaints of generalized weakness and shortness of breath.In ED, Hypoxic,  positive for COVID-19 admitted  3/31: Admit to [facility], COVID viral pneumonia 4/3: A-fib with RVR, elevated troponin 5919 w/ concern for NSTEMI; started on amiodarone/heparin drip; transfer to [facility] 2D echo noted EF 20-25% with R WMA in LAD territory, severe LVH with concern for low output 4/4: s/p Cardiac catheterization, transferring to 2H> cardiogenic shock -Started on milrinone -RHC noted normal filling pressures, milrinone discontinued 4/7    Hospital Course:   Acute respiratory failure with hypoxia (HCC) -Multifactorial secondary to CHF and COVID-pneumonia Improving, continue to wean O2 at SNF, now down to 2 L -PT re-eval  completed, SNF recommended for short-term rehab   Acute systolic CHF with low output Cardiogenic shock -Echo this admission noted EF 20-25% with R WMA in LAD territory, severe LVH -LHC with no obstructive CAD, RHC with normal filling pressures -Milrinone discontinued 4/7 -Now switched to oral torsemide, continue Aldactone -Poor candidate for SGLT2i with frequent urinary retention and Foley -He will discharge to SNF for short-term rehab, overall prognosis is not very good, recommend close palliative care follow-up as well   NSTEMI, type II MI -Troponin peaked at 5900, 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 -LHC 4/4 with no obstructive CAD -Continue aspirin   Paroxysmal atrial fibrillation -Underwent TEE/DCCV 4/7 -Remains in sinus rhythm, now switched to oral amiodarone, changed to Eliquis   AKI -Baseline creatinine 1, creatinine peaked at 2.9, now improving   Urinary retention/BPH -Foley removed yesterday morning, had retention overnight required I/O cath at 2 AM, add Flomax, Foley catheter replaced today for recurrent retention -Urology follow-up and voiding trial at SNF   Chronic pain Narcotic dependence -On oral Dilaudid at baseline   Memory loss, early dementia -At risk of delirium   COVID-19 infection -Improved, completed 5-day course of Paxlovid -Discontinue IV Decadron   Abnormal LFTs -Likely from passive hepatic congestion, improving  Discharge Exam: Vitals:   09/07/23 2343 09/08/23 0516  BP: 100/67 92/67  Pulse: 68 64  Resp: 20 16  Temp: 98 F (36.7 C) 98.1 F (36.7 C)  SpO2: 92% 96%   Gen: Awake, Alert, Oriented X 2, frail cachectic, chronically ill HEENT: no JVD Lungs: Good air movement bilaterally, CTAB CVS: S1S2/RRR Abd: soft, Non tender, non distended, BS present, GU with Foley catheter Extremities: No edema Skin: no new rashes on exposed skin   Discharge  Instructions    Allergies as of 09/08/2023       Reactions   Nsaids Shortness Of Breath    Other reaction(s): Bronchospasm (ALLERGY/intolerance)   Oxycodone Other (See Comments)   Agitation. Per the any pain medication that has codeine in it causes that reaction. Other reaction(s): Other Agitation. Per the any pain medication that has codeine in it causes that reaction.   Tolmetin Shortness Of Breath   Tramadol Anaphylaxis   Fentanyl Other (See Comments)   Codeine Anxiety   Other reaction(s): Bronchospasm (ALLERGY/intolerance), Other (See Comments) Irritable        Medication List     STOP taking these medications    carvedilol 12.5 MG tablet Commonly known as: COREG       TAKE these medications    acetaminophen 500 MG tablet Commonly known as: TYLENOL Take 1,000 mg by mouth every 8 (eight) hours as needed for mild pain (pain score 1-3) or moderate pain (pain score 4-6). For pain.   amiodarone 400 MG tablet Commonly known as: PACERONE Take 400 Mg twice a day for 4 more days followed by 200 Mg twice a day for 7 days followed by 200 Mg daily   apixaban 5 MG Tabs tablet Commonly known as: ELIQUIS Take 1 tablet (5 mg total) by mouth 2 (two) times daily.   dapagliflozin propanediol 10 MG Tabs tablet Commonly known as: FARXIGA Take 1 tablet (10 mg total) by mouth daily. Start taking on: September 09, 2023   DRY EYES OP Apply 1 drop to eye daily as needed (dry eyes).   gabapentin 400 MG tablet Commonly known as: NEURONTIN Take 1 tablet (400 mg total) by mouth at bedtime. What changed:  medication strength how much to take when to take this   HYDROmorphone 4 MG tablet Commonly known as: DILAUDID Take 1 tablet (4 mg total) by mouth every 8 (eight) hours.   omeprazole 40 MG capsule Commonly known as: PRILOSEC Take 40 mg by mouth daily.   senna-docusate 8.6-50 MG tablet Commonly known as: Senokot-S Take 2 tablets by mouth 2 (two) times daily as needed for moderate constipation.   sertraline 25 MG tablet Commonly known as: ZOLOFT Take 75 mg by mouth  daily.   spironolactone 25 MG tablet Commonly known as: ALDACTONE Take 1 tablet (25 mg total) by mouth daily. Start taking on: September 09, 2023   tamsulosin 0.4 MG Caps capsule Commonly known as: FLOMAX Take 1 capsule (0.4 mg total) by mouth daily after breakfast. Start taking on: September 09, 2023   torsemide 20 MG tablet Commonly known as: DEMADEX Take 1 tablet (20 mg total) by mouth daily. Start taking on: September 09, 2023               Durable Medical Equipment  (From admission, onward)           Start     Ordered   09/05/23 1136  Heart failure home health orders  (Heart failure home health orders / Face to face)  Once       Comments: Heart Failure Follow-up Care:  Verify follow-up appointments per Patient Discharge Instructions. Confirm transportation arranged. Reconcile home medications with discharge medication list. Remove discontinued medications from use. Assist patient/caregiver to manage medications using pill box. Reinforce low sodium food selection Assessments: Vital signs and oxygen saturation at each visit. Assess home environment for safety concerns, caregiver support and availability of low-sodium foods. Consult Child psychotherapist, PT/OT, Dietitian, and CNA based on assessments. Perform comprehensive  cardiopulmonary assessment. Notify MD for any change in condition or weight gain of 3 pounds in one day or 5 pounds in one week with symptoms. Daily Weights and Symptom Monitoring: Ensure patient has access to scales. Teach patient/caregiver to weigh daily before breakfast and after voiding using same scale and record.    Teach patient/caregiver to track weight and symptoms and when to notify Provider. Activity: Develop individualized activity plan with patient/caregiver.   Question Answer Comment  Heart Failure Follow-up Care Or per Doctor (see comments)   Heart Failure Follow-up Care Advanced Heart Failure (AHF) Clinic at [phone]   Obtain the following  labs Basic Metabolic Panel   Lab frequency Weekly   Fax lab results to AHF Clinic at [phone]   Diet Low Sodium Heart Healthy   Fluid restrictions: 1800 mL Fluid      09/05/23 1137           Allergies  Allergen Reactions   Nsaids Shortness Of Breath    Other reaction(s): Bronchospasm (ALLERGY/intolerance)   Oxycodone Other (See Comments)    Agitation. Per the any pain medication that has codeine in it causes that reaction. Other reaction(s): Other Agitation. Per the any pain medication that has codeine in it causes that reaction.   Tolmetin Shortness Of Breath   Tramadol Anaphylaxis   Fentanyl Other (See Comments)   Codeine Anxiety    Other reaction(s): Bronchospasm (ALLERGY/intolerance), Other (See Comments) Irritable     Follow-up Information     [facility] Follow up.   Specialty: Home Health Services Why: Frances Furbish will follow up with you at discharge to provide home health services Contact information: [address] [place] [address] [phone]         [facility] Follow up on 09/25/2023.   Specialty: Cardiology Why: at 2:40 pm with Dr. Elwyn Lade [facility] Entr[facility] C Contact information: [address] [place] [address] [phone]                 The results of significant diagnostics from this hospitalization (including imaging, microbiology, ancillary and laboratory) are listed below for reference.    Significant Diagnostic Studies: DG Chest 2 View Result Date: 09/06/2023 CLINICAL DATA:  Fever.  Hemoptysis.  COVID-19. EXAM: CHEST - 2 VIEW COMPARISON:  Chest radiograph dated 05/15/2011. FINDINGS: Mild cardiomegaly with mild central vascular congestion. Elevation of the right hemidiaphragm. There are bibasilar atelectasis. Pneumonia is not excluded. No large pleural effusion. No pneumothorax. Atherosclerotic calcification of the aorta. No  acute osseous pathology. IMPRESSION: 1. Mild cardiomegaly with mild central vascular congestion. 2. Bibasilar atelectasis. Pneumonia is not excluded. Electronically Signed   By: Elgie Collard M.D.   On: 09/06/2023 14:01   ECHO TEE Result Date: 09/05/2023    TRANSESOPHOGEAL ECHO REPORT   Patient Name:   Braylee RAYNALDO FALCO Date of Exam: 09/04/2023 Medical Rec #:  [id number]     Height:       72.0 in Accession #:    [id number]    Weight:       184.3 lb Date of Birth:  February 05, 1946     BSA:          2.058 m Patient Age:    77 years      BP:           110/71 mmHg Patient Gender: M             HR:  90 bpm. Exam Location:  Inpatient Procedure: 2D Echo, Color Doppler and Cardiac Doppler (Both Spectral and Color            Flow Doppler were utilized during procedure). Indications:     Cardioversion  History:         Patient has prior history of Echocardiogram examinations, most                  recent 08/31/2023.  Sonographer:     Harriette Bouillon RDCS Referring Phys:  [id number] [place] LEE Diagnosing Phys: Wilfred Lacy PROCEDURE: The transesophogeal probe was passed without difficulty through the esophogus of the patient. Sedation performed by different physician. The patient developed no complications during the procedure.  IMPRESSIONS  1. Left ventricular ejection fraction, by estimation, is 30%. The left ventricle has moderate to severely decreased function. The left ventricle demonstrates regional wall motion abnormalities with the function of basilar LV segments relatively preserved and mid to apical segments more hypokinetic.  2. Right ventricular systolic function is mildly reduced. The right ventricular size is normal.  3. Left atrial size was moderately dilated. No left atrial/left atrial appendage thrombus was detected.  4. No PFO or ASD by color doppler.  5. There was prolapse of the posterior leaflet without flail segment. The mitral valve is abnormal. Mild mitral valve regurgitation. No evidence of mitral  stenosis.  6. The aortic valve is tricuspid. Aortic valve regurgitation is moderate. No aortic stenosis is present. FINDINGS  Left Ventricle: Left ventricular ejection fraction, by estimation, is 30%. The left ventricle has moderate to severely decreased function. The left ventricle demonstrates regional wall motion abnormalities. The left ventricular internal cavity size was normal in size. Right Ventricle: The right ventricular size is normal. No increase in right ventricular wall thickness. Right ventricular systolic function is mildly reduced. Left Atrium: Left atrial size was moderately dilated. No left atrial/left atrial appendage thrombus was detected. Right Atrium: Right atrial size was normal in size. Pericardium: There is no evidence of pericardial effusion. 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 The mitral valve is abnormal. Mild mitral valve regurgitation. No evidence of mitral valve stenosis. Tricuspid Valve: The tricuspid valve is normal in structure. Tricuspid valve regurgitation is mild. Aortic Valve: The aortic valve is tricuspid. Aortic valve regurgitation is moderate. No aortic stenosis is present. Pulmonic Valve: The pulmonic valve was normal in structure. Pulmonic valve regurgitation is trivial. Aorta: The aortic root is normal in size and structure. IAS/Shunts: No PFO or ASD by color doppler. Dalton McleanMD Electronically signed by Wilfred Lacy Signature Date/Time: 09/05/2023/3:35:45 PM    Final    EP STUDY Result Date: 09/04/2023 See surgical note for result.  CARDIAC CATHETERIZATION Result Date: 09/01/2023 HEMODYNAMICS: RA:       7 mmHg (mean) RV:       25/3, 6 mmHg PA:       27/15 mmHg (21 mean) PCWP: 12 mmHg (mean)    Estimated Fick CO/CI   3.07L/min, 1.52L/min/m2 Thermodilution CO/CI   2.4L/min, 1.19L/min/m2    PAPi  1.7  IMPRESSION: Right heart catheterization for worsening shock. Normal coronary angiography with significant aortic  tortuosity Normal biventricular filling pressures Severely reduced cardiac output by thermodilution RECOMMENDATIONS: Patient with underlying dementia and poor functional status. Not a good candidate for MCS. Will attempt to temporize with inotropes, potential cardioversion. Family updated on critically ill status. Patient transferred to the CCU.   DG Chest Port 1 View Result Date: 09/01/2023 CLINICAL  DATA:  PICC placement. EXAM: PORTABLE CHEST 1 VIEW COMPARISON:  August 31, 2023 FINDINGS: Stable cardiomediastinal silhouette. Right-sided PICC line is noted with distal tip in expected position of SVC. Left perihilar opacity is noted concerning for pneumonia or edema. Stable elevated right hemidiaphragm with minimal right basilar sub small liked assist. Bony thorax is unremarkable. IMPRESSION: Interval placement of right-sided PICC line with distal tip in expected position of SVC. Electronically Signed   By: Lupita Raider M.D.   On: 09/01/2023 10:15   CT HEAD WO CONTRAST ( ) Result Date: 08/31/2023 CLINICAL DATA:  Fall with memory loss EXAM: CT HEAD WITHOUT CONTRAST TECHNIQUE: Contiguous axial images were obtained from the base of the skull through the vertex without intravenous contrast. RADIATION DOSE REDUCTION: This exam was performed according to the departmental dose-optimization program which includes automated exposure control, adjustment of the mA and/or kV according to patient size and/or use of iterative reconstruction technique. COMPARISON:  None Available. FINDINGS: Brain: There is no mass, hemorrhage or extra-axial collection. The size and configuration of the ventricles and extra-axial CSF spaces are normal. There is hypoattenuation of the white matter, most commonly indicating chronic small vessel disease. Vascular: No hyperdense vessel or unexpected vascular calcification. Skull: The visualized skull base, calvarium and extracranial soft tissues are normal. Sinuses/Orbits: No fluid levels or  advanced mucosal thickening of the visualized paranasal sinuses. No mastoid or middle ear effusion. Normal orbits. Other: None. IMPRESSION: 1. No acute intracranial abnormality. 2. Chronic small vessel disease. Electronically Signed   By: Deatra Robinson M.D.   On: 08/31/2023 21:16   DG Chest 1 View Result Date: 08/31/2023 CLINICAL DATA:  Respiratory distress EXAM: CHEST  1 VIEW COMPARISON:  Chest x-ray 08/30/2023 FINDINGS: There stable moderate elevation of the right hemidiaphragm. There some diffuse hazy airspace opacities in the left upper lobe which have increased. The heart is enlarged, unchanged. There are atherosclerotic calcifications of the aorta. There is no pleural effusion or pneumothorax. Degenerative changes affect the left shoulder. IMPRESSION: 1. Increasing hazy airspace opacities in the left upper lobe, concerning for pneumonia. 2. Stable cardiomegaly. 3. Stable moderate elevation of the right hemidiaphragm. Electronically Signed   By: Darliss Cheney M.D.   On: 08/31/2023 17:57   [place] EKG SITE RITE Result Date: 08/31/2023 If Site Rite image not attached, placement could not be confirmed due to current cardiac rhythm.  ECHOCARDIOGRAM COMPLETE Result Date: 08/31/2023    ECHOCARDIOGRAM REPORT   Patient Name:   Dyllan YEHONATAN GRANDISON Date of Exam: 08/31/2023 Medical Rec #:  [id number]     Height:       72.0 in Accession #:    [id number]    Weight:       186.6 lb Date of Birth:  03/13/1946     BSA:          2.069 m Patient Age:    77 years      BP:           109/81 mmHg Patient Gender: M             HR:           142 bpm. Exam Location:  Inpatient Procedure: 2D Echo, Color Doppler, Cardiac Doppler and Intracardiac            Opacification Agent (Both Spectral and Color Flow Doppler were            utilized during procedure). Indications:    CHF-Acute Systolic  History:  Patient has prior history of Echocardiogram examinations.                 Arrythmias:Bradycardia; Signs/Symptoms:Hypotension.  Sonographer:     Lamont Snowball Referring Phys: [id number] Marinda Elk  Sonographer Comments: Technically difficult study due to poor echo windows. IMPRESSIONS  1. No apical thrombus with Definity contrast. Left ventricular ejection fraction, by estimation, is 35 to 40%. Left ventricular ejection fraction by 2D MOD biplane is 37.1 %. The left ventricle has moderately decreased function. The left ventricle demonstrates regional wall motion abnormalities (see scoring diagram/findings for description). There is severe concentric left ventricular hypertrophy. Left ventricular diastolic function could not be evaluated. There is severe hypokinesis of the left ventricular, mid-apical apical segment, anterior wall, lateral wall and inferior segment. The base of the heart is hyperdynamic. Findings suggestive of Takatsubo cardiomyopathy, although, large LAD territory ischemic could not be excluded.  2. Right ventricular systolic function is normal. The right ventricular size is normal.  3. Left atrial size was moderately dilated.  4. The mitral valve is grossly normal. Trivial mitral valve regurgitation.  5. The aortic valve is tricuspid. Aortic valve regurgitation is not visualized. Aortic valve sclerosis is present, with no evidence of aortic valve stenosis.  6. Aortic dilatation noted. There is borderline dilatation of the ascending aorta, measuring 39 mm.  7. Rhythm strip during this exam demonstrates atrial fibrillation and with RVR. Comparison(s): Changes from prior study are noted. 08/14/2017: LVEF 55-60%. FINDINGS  Left Ventricle: No apical thrombus with Definity contrast. Left ventricular ejection fraction, by estimation, is 35 to 40%. Left ventricular ejection fraction by 2D MOD biplane is 37.1 %. The left ventricle has moderately decreased function. The left ventricle demonstrates regional wall motion abnormalities. Severe hypokinesis of the left ventricular, mid-apical apical segment, anterior wall, lateral wall and inferior  segment. The left ventricular internal cavity size was normal in size. There is severe concentric left ventricular hypertrophy. Left ventricular diastolic function could not be evaluated due to atrial fibrillation. Left ventricular diastolic function could not be evaluated.  LV Wall Scoring: The mid and distal anterior wall, mid and distal lateral wall, mid and distal anterior septum, entire apex, mid and distal inferior wall, mid anterolateral segment, and mid inferoseptal segment are hypokinetic. The base of the heart is hyperdynamic. Findings suggestive of Takatsubo cardiomyopathy, although, large LAD territory ischemic could not be excluded. Right Ventricle: The right ventricular size is normal. No increase in right ventricular wall thickness. Right ventricular systolic function is normal. Left Atrium: Left atrial size was moderately dilated. Right Atrium: Right atrial size was normal in size. Pericardium: There is no evidence of pericardial effusion. Mitral Valve: The mitral valve is grossly normal. Trivial mitral valve regurgitation. MV peak gradient, 2.5 mmHg. The mean mitral valve gradient is 1.0 mmHg. Tricuspid Valve: The tricuspid valve is grossly normal. Tricuspid valve regurgitation is mild. Aortic Valve: The aortic valve is tricuspid. Aortic valve regurgitation is not visualized. Aortic valve sclerosis is present, with no evidence of aortic valve stenosis. Aortic valve peak gradient measures 2.8 mmHg. Pulmonic Valve: The pulmonic valve was not well visualized. Pulmonic valve regurgitation is not visualized. Aorta: Aortic dilatation noted. There is borderline dilatation of the ascending aorta, measuring 39 mm. IAS/Shunts: The interatrial septum was not well visualized. EKG: Rhythm strip during this exam demonstrates atrial fibrillation and with RVR.  LEFT VENTRICLE PLAX 2D  Biplane EF (MOD) LVIDd:         4.00 cm         LV Biplane EF:   Left LVIDs:         3.00 cm                           ventricular LV PW:         1.70 cm                          ejection LV IVS:        1.60 cm                          fraction by LVOT diam:     2.20 cm                          2D MOD LV SV:         40                               biplane is LV SV Index:   19                               37.1 %. LVOT Area:     3.80 cm  LV Volumes (MOD) LV vol d, MOD    67.6 ml A2C: LV vol d, MOD    110.3 ml A4C: LV vol s, MOD    41.0 ml A2C: LV vol s, MOD    69.0 ml A4C: LV SV MOD A2C:   26.5 ml LV SV MOD A4C:   110.3 ml LV SV MOD BP:    33.0 ml LEFT ATRIUM             Index        RIGHT ATRIUM           Index LA Vol (A2C):   82.9 ml 40.07 ml/m  RA Area:     15.80 cm LA Vol (A4C):   89.2 ml 43.12 ml/m  RA Volume:   38.80 ml  18.76 ml/m LA Biplane Vol: 90.7 ml 43.84 ml/m  AORTIC VALVE AV Area (Vmax): 3.92 cm AV Vmax:        84.20 cm/s AV Peak Grad:   2.8 mmHg LVOT Vmax:      86.80 cm/s LVOT Vmean:     55.800 cm/s LVOT VTI:       0.105 m  AORTA Ao Root diam: 3.80 cm Ao Asc diam:  3.90 cm MITRAL VALVE               TRICUSPID VALVE MV Area (PHT): 10.25 cm   TR Peak grad:   18.8 mmHg MV Area VTI:   4.49 cm    TR Vmax:        217.00 cm/s MV Peak grad:  2.5 mmHg MV Mean grad:  1.0 mmHg    SHUNTS MV Vmax:       0.80 m/s    Systemic VTI:  0.10 m MV Vmean:      49.0 cm/s   Systemic Diam: 2.20 cm MV Decel Time: 74 msec MV E velocity: 30.40 cm/s MV A velocity: 65.10 cm/s MV E/A ratio:  0.47 Zoila Shutter MD Electronically signed by Zoila Shutter MD Signature Date/Time: 08/31/2023/1:09:55 PM    Final    DG Chest 1 View Result Date: 08/30/2023 CLINICAL DATA:  Pneumonia and fever EXAM: CHEST  1 VIEW COMPARISON:  Chest x-ray 08/28/2023 FINDINGS: There stable moderate elevation of the right hemidiaphragm. Heart is enlarged. There central pulmonary vascular congestion and patchy perihilar opacities which have increased. No pneumothorax or acute fracture. IMPRESSION: Cardiomegaly with central pulmonary vascular congestion and patchy  perihilar opacities which have increased. Findings may be due to pulmonary edema or infection. Follow-up PA and lateral chest x-ray recommended in 4-6 weeks to confirm resolution. Electronically Signed   By: Darliss Cheney M.D.   On: 08/30/2023 16:55   DG Chest Port 1 View Result Date: 08/28/2023 CLINICAL DATA:  Possible sepsis. EXAM: PORTABLE CHEST 1 VIEW COMPARISON:  11/10/2020.  Abdomen and pelvis CT dated 11/10/2020. FINDINGS: Stable markedly elevated right hemidiaphragm with adjacent right basilar atelectasis. Interval small amount of left basilar atelectasis. No airspace consolidation suspicious for pneumonia. Normal-sized heart. Tortuous and partially calcified thoracic aorta. Diffuse osteopenia. IMPRESSION: 1. Interval small amount of left basilar atelectasis. 2. Stable markedly elevated right hemidiaphragm with adjacent right basilar atelectasis. 3. No evidence of pneumonia. Electronically Signed   By: Beckie Salts M.D.   On: 08/28/2023 16:41    Microbiology: Recent Results (from the past 240 hours)  MRSA Next Gen by PCR, Nasal     Status: None   Collection Time: 08/31/23  9:20 AM   Specimen: Nasal Mucosa; Nasal Swab  Result Value Ref Range Status   MRSA by PCR Next Gen NOT DETECTED NOT DETECTED Final    Comment: (NOTE) The GeneXpert MRSA Assay (FDA approved for NASAL specimens only), is one component of a comprehensive MRSA colonization surveillance program. It is not intended to diagnose MRSA infection nor to guide or monitor treatment for MRSA infections. Test performance is not FDA approved in patients less than 45 years old. Performed at [facility], [address] [place], [place] [address]      Labs: Basic Metabolic Panel: Recent Labs  Lab 09/02/23 0519 09/03/23 0657 09/04/23 0627 09/05/23 0500 09/06/23 0446 09/07/23 0547 09/08/23 0500  NA 134* 130* 129* 128* 127* 126* 126*  K 4.3 3.6 4.2 4.2 4.2 4.3 4.3  CL 97* 95* 96* 95* 93* 91* 90*  CO2  21* 21* 22 23 25 26 24   GLUCOSE 179* 172* 104* 117* 96 80 89  BUN 57* 74* 69* 58* 50* 42* 44*  CREATININE 2.10* 2.91* 2.39* 1.98* 1.73* 1.69* 1.87*  CALCIUM 8.4* 8.2* 8.0* 8.5* 8.5* 8.8* 8.6*  MG 2.3 2.2 2.2 2.2 2.2  --   --    Liver Function Tests: Recent Labs  Lab 09/01/23 1032 09/02/23 0519  AST 92* 55*  ALT 50* 44  ALKPHOS 31* 32*  BILITOT 0.8 0.8  PROT 5.9* 5.4*  ALBUMIN 3.2* 3.0*   No results for input(s): "LIPASE", "AMYLASE" in the last 168 hours. No results for input(s): "AMMONIA" in the last 168 hours. CBC: Recent Labs  Lab 09/01/23 1032 09/01/23 1241 09/02/23 0519 09/06/23 0446 09/07/23 0547 09/08/23 0500  WBC 21.6*  --  17.3* 15.0* 16.7* 14.3*  NEUTROABS 18.2*  --   --   --   --   --   HGB 14.4 14.6  14.6 13.8 13.2 13.6 12.6*  HCT 42.4 43.0  43.0 40.4 38.9* 39.0 38.2*  MCV 91.2  --  91.6 89.8 89.7 91.2  PLT 207  --  207 179 180 185   Cardiac Enzymes: No results for input(s): "CKTOTAL", "CKMB", "CKMBINDEX", "TROPONINI" in the last 168 hours. BNP: BNP (last 3 results) Recent Labs    08/30/23 1738 08/31/23 0705  BNP 2,770.5* 1,951.7*    ProBNP (last 3 results) No results for input(s): "PROBNP" in the last 8760 hours.  CBG: No results for input(s): "GLUCAP" in the last 168 hours.     Signed:  Zannie Cove MD.  [facility] 09/08/2023, 10:03 AM

## 2023-09-08 NOTE — Plan of Care (Signed)
  Problem: Respiratory: Goal: Will maintain a patent airway Outcome: Progressing   Problem: Education: Goal: Knowledge of General Education information will improve Description: Including pain rating scale, medication(s)/side effects and non-pharmacologic comfort measures Outcome: Progressing   Problem: Clinical Measurements: Goal: Will remain free from infection Outcome: Progressing Goal: Diagnostic test results will improve Outcome: Progressing   Problem: Nutrition: Goal: Adequate nutrition will be maintained Outcome: Progressing   Problem: Coping: Goal: Level of anxiety will decrease Outcome: Progressing

## 2023-09-08 NOTE — Progress Notes (Addendum)
 Advanced Heart Failure Team Progress Note   Primary Physician: [facility] Cardiologist:  Parke Poisson, MD Chief Complaint: Deconditioning Interval hx:   CVP 7-8 sCr up from 1.69>1.87. SBP 80-90s overnight.   Objective:    Vital Signs:   Temp:  [97.6 F (36.4 C)-98.1 F (36.7 C)] 98.1 F (36.7 C) (04/11 0516) Pulse Rate:  [63-69] 64 (04/11 0516) Resp:  [11-20] 16 (04/11 0516) BP: (84-103)/(56-67) 92/67 (04/11 0516) SpO2:  [92 %-96 %] 96 % (04/11 0516) Weight:  [83.9 kg] 83.9 kg (04/11 0516) Last BM Date : 09/04/23  Weight change: Filed Weights   09/06/23 0401 09/07/23 0400 09/08/23 0516  Weight: 81.1 kg 81.9 kg 83.9 kg   Intake/Output:  Intake/Output Summary (Last 24 hours) at 09/08/2023 0845 Last data filed at 09/07/2023 2100 Gross per 24 hour  Intake 250 ml  Output 1350 ml  Net -1100 ml    Physical Exam   CVP 7-8 General: Well appearing. No distress on [place] Cardiac: JVP ~7cm. S1 and S2 present. No murmurs or rub. Extremities: Warm and dry.  1+ edema in b/l feet and ankles.  Neuro: Alert and oriented x3. Affect pleasant. Generalized weakness. Lines/Devices:  RUE PICC  Telemetry   SR in 60s (personally reviewed)  Labs   Basic Metabolic Panel: Recent Labs  Lab 09/02/23 0519 09/03/23 0657 09/04/23 0627 09/05/23 0500 09/06/23 0446 09/07/23 0547 09/08/23 0500  NA 134* 130* 129* 128* 127* 126* 126*  K 4.3 3.6 4.2 4.2 4.2 4.3 4.3  CL 97* 95* 96* 95* 93* 91* 90*  CO2 21* 21* 22 23 25 26 24   GLUCOSE 179* 172* 104* 117* 96 80 89  BUN 57* 74* 69* 58* 50* 42* 44*  CREATININE 2.10* 2.91* 2.39* 1.98* 1.73* 1.69* 1.87*  CALCIUM 8.4* 8.2* 8.0* 8.5* 8.5* 8.8* 8.6*  MG 2.3 2.2 2.2 2.2 2.2  --   --    Liver Function Tests: Recent Labs  Lab 09/01/23 1032 09/02/23 0519  AST 92* 55*  ALT 50* 44  ALKPHOS 31* 32*  BILITOT 0.8 0.8  PROT 5.9* 5.4*  ALBUMIN 3.2* 3.0*   No results for input(s): "LIPASE", "AMYLASE" in the last 168 hours. No  results for input(s): "AMMONIA" in the last 168 hours.  CBC: Recent Labs  Lab 09/01/23 1032 09/01/23 1241 09/02/23 0519 09/06/23 0446 09/07/23 0547 09/08/23 0500  WBC 21.6*  --  17.3* 15.0* 16.7* 14.3*  NEUTROABS 18.2*  --   --   --   --   --   HGB 14.4 14.6  14.6 13.8 13.2 13.6 12.6*  HCT 42.4 43.0  43.0 40.4 38.9* 39.0 38.2*  MCV 91.2  --  91.6 89.8 89.7 91.2  PLT 207  --  207 179 180 185   BNP: BNP (last 3 results) Recent Labs    08/30/23 1738 08/31/23 0705  BNP 2,770.5* 1,951.7*   CBG: No results for input(s): "GLUCAP" in the last 168 hours.  Imaging   No results found.  Medications:    Current Medications:  amiodarone  400 mg Oral BID   apixaban  5 mg Oral BID   Chlorhexidine Gluconate Cloth  6 each Topical Daily   dapagliflozin propanediol  10 mg Oral Daily   HYDROmorphone  4 mg Oral Q8H   losartan  12.5 mg Oral Daily   pantoprazole  40 mg Oral Daily   polyethylene glycol  17 g Oral BID   senna-docusate  2 tablet Oral BID   sertraline  25 mg Oral TID   sodium chloride flush  10-40 mL Intracatheter Q12H   sodium chloride flush  3 mL Intravenous Q12H   spironolactone  25 mg Oral Daily   tamsulosin  0.4 mg Oral QPC breakfast   torsemide  20 mg Oral Daily   Infusions:  Assessment/Plan   Acute systolic CHF - presented with cardiogenic shock, now resolved.  - Etiology not certain. HS troponin > 5900. Echo with EF 20-25% and RWMA in LAD territory, severe LVH. 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 - CVP 7-8. Continue torsemide 20 mg daily  - Continue spiro 25 mg daily  - Stop Marcelline Deist with urinary retention and immobility - Hold losartan for hypotension and bump in sCr. - Under palliative care services at home.  2. Type 2 MI - Reported chest pain this admission. HS troponin peaked at > 5900 as above. LHC non obstructive CAD.   -  Potentially ACS vs Takotsubo - Continue eliquis 5 mg bid  3. Atrial fibrillation  - 4/7 s/p TEE/DCCV--> NSR.   - Maintaining SR.  - Continue Eliquis 5mg  bid. Has 500.00 deductible for Eliquis. Once that is met, Co-Pay will be 47.00. He can get 30 day free CoPay card at d/c. Discussed with him and his wife they are agreeable.  - Continue amio 400 mg twice a day x 7 days then (4/15) 200 mg twice a day x 7 day , then (4/22) 200 mg daily.   4. COVID-19 infection: competed Paxlovid Acute respiratory failure: resolved  5. AKI - sCr baseline around 1 - sCr improved to 1.69, but slightly up today 1.87. Suspect with hypotension.   6. Elevated LFTs: resolved  7. Chronic pain syndrome: On dilaudid 4 mg po q8hr (home dose)  8. Memory impairment -? Dementia - Notes declining memory over several years. Watch for delirium.  9. Urinary retention: foley catheter + flomax 0.4mg  daily - with fluctuant mental status and urinary retention, possible he may have UTI. WBC improving.  - stop SSGLT2i  Awaiting SNF offers.   Length of Stay: 10  Brian Lee, NP  09/08/2023, 8:45 AM  Advanced Heart Failure Team Pager [phone] (M-F; 7a - 5p)  Please contact [facility] for night-coverage after hours (4p -7a ) and weekends on [email or web address]   Patient seen with NP, I formulated the plan and agree with the above note.   Creatinine higher today 1.69 => 1.87, CVP 7-8.  SBP 80s-100s.   He remains in NSR on po amiodarone.   General: NAD Neck: No JVD, no thyromegaly or thyroid nodule.  Lungs: Clear to auscultation bilaterally with normal respiratory effort. CV: Nondisplaced PMI.  Heart regular S1/S2, no S3/S4, no murmur.  No peripheral edema.   Abdomen: Soft, nontender, no hepatosplenomegaly, no distention.  Skin: Intact without lesions or rashes.  Neurologic: Alert and oriented x 3.  Psych: Normal affect. Extremities: No clubbing or cyanosis.  HEENT: Normal.   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 ?Stress (Takotsubo-type) cardiomyopathy vs tachy-mediated CMP. TEE 4/7 showed LV EF 25-30% with apical/peri-apical akinesis, mild RV dysfunction, moderate AI. He is now back in NSR.  Looks euvolemic with CVP 8 on my read and co-ox 74% off milrinone. Creatinine trending back up 2.39 => 1.98 => 1.73 => 1.69 => 1.87 today. GDMT limited by AKI.  - Continue torsemide 20 mg  daily.  - Decided to hold off on Farxiga due to history of urinary retention and immobility.  - Continue spironolactone 25 mg daily.  - Stop losartan with rising creatinine and soft BP.  - Need to keep in NSR.    Persistent atrial fibrillation, now s/p DCCV.  He remains in NSR today.    - Continue amiodarone 400 mg bid, taper as outpatient (400 mg bid total of 1 week, then 200 mg bid x 1 week then 200 mg daily).  - Continue Eliquis   Frail, limited to short distances by walker at baseline.  Plan for rehab stay prior to home.  He is stable for discharge from cardiology perspective.   Marca Ancona 09/08/2023 12:20 PM

## 2023-09-08 NOTE — Progress Notes (Signed)
 Tried calling SNF to give report twice, secretary answered both times and tried transferring to RN receiving patient, first time the phone just kept ringing with no answer, the second time someone finally answered and asked the name of the patient and said that they would let nurse know, asked if they wanted my phone number to call back and person then hung up phone.

## 2023-09-08 NOTE — TOC Progression Note (Signed)
 Transition of Care [facility]) - Progression Note    Patient Details  Name: Brian Ruiz MRN: [id number] Date of Birth: July 31, 1945  Transition of [facility]) CM/SW Contact  Reva Bores, LCSWA Phone Number: 09/08/2023, 2:39 PM  Clinical Narrative:   HF CSW met with patients wife outside of the room to present Medicare.gov bed list accepted bed offers. Patients wife stated that she was not pleased with the bed offers presented. Patients wife inquired about [facility] SNF. CSW explained that facility declined.   Patients wife called CSW and stated that she spoke with family and they suggested [facility] SNF. CSW explained that bed availibility needed to be confirmed.   12:42 PM- HF CSW reached out to [facility] to confirm bed availability. Star stated that they do have a bed to offer.   CSW called and updated the patients wife and stated that the patient was medically ready for dc today. Patients wife asked if PTAR could be called at a later time so she could go home to grab the patient some clothes to be transferred in.    TOC will continue following.    Expected Discharge Plan: Home w Home Health Services Barriers to Discharge: Continued Medical Work up  Expected Discharge Plan and Services In-house Referral: Clinical Social Work Discharge Planning Services: CM Consult Post Acute Care Choice: Home Health Living arrangements for the past 2 months: Single Family Home Expected Discharge Date: 09/08/23               DME Arranged: N/A DME Agency: NA       HH Arranged: PT, OT HH Agency: [facility] Date [facility] Agency Contacted: 09/04/23 Time HH Agency Contacted: 1212 Representative spoke with at [facility] Agency: Kandee Keen   Social Determinants of Health (SDOH) Interventions SDOH Screenings   Food Insecurity: No Food Insecurity (08/28/2023)  Housing: Low Risk  (08/28/2023)  Transportation Needs: No Transportation Needs (08/28/2023)  Utilities: Not At Risk (08/28/2023)  Financial  Resource Strain: Low Risk  (02/08/2021)   Received from [facility], [facility]  Physical Activity: Inactive (02/08/2021)   Received from [facility], [facility]  Social Connections: Socially Integrated (08/28/2023)  Stress: No Stress Concern Present (02/08/2021)   Received from [facility], [facility]  Tobacco Use: Low Risk  (08/28/2023)    Readmission Risk Interventions    08/30/2023   12:30 PM  Readmission Risk Prevention Plan  Post Dischage Appt Complete  Medication Screening Complete  Transportation Screening Complete

## 2023-09-08 NOTE — TOC Transition Note (Signed)
 Transition of Care [facility]) - Discharge Note   Patient Details  Name: Brian Ruiz MRN: [id number] Date of Birth: Jan 29, 1946  Transition [facility]) CM/SW Contact:  Nicanor Bake Phone Number: [phone] 09/08/2023, 3:00 PM   Clinical Narrative:  HF CSW notified bedside RN with the number to call for report [phone]. Room number 908P. Wife asked for some time to run and grab him some clothes and come back.    CSW left PTAR documents at bedside.   3:25 PM- HF CSW called PTAR.   TOC will continue following.     Final next level of care: Home w Home Health Services Barriers to Discharge: Continued Medical Work up   Patient Goals and CMS Choice Patient states their goals for this hospitalization and ongoing recovery are:: To return home CMS Medicare.gov Compare Post Acute Care list provided to:: Patient Represenative (must comment) Choice offered to / list presented to : Spouse      Discharge Placement                       Discharge Plan and Services Additional resources added to the After Visit Summary for   In-house Referral: Clinical Social Work Discharge Planning Services: CM Consult Post Acute Care Choice: Home Health          DME Arranged: N/A DME Agency: NA       HH Arranged: PT, OT HH Agency: [facility] Date [facility] Agency Contacted: 09/04/23 Time HH Agency Contacted: 1212 Representative spoke with at [facility] Agency: Kandee Keen  Social Drivers of Health (SDOH) Interventions SDOH Screenings   Food Insecurity: No Food Insecurity (08/28/2023)  Housing: Low Risk  (08/28/2023)  Transportation Needs: No Transportation Needs (08/28/2023)  Utilities: Not At Risk (08/28/2023)  Financial Resource Strain: Low Risk  (02/08/2021)   Received from [facility], [facility]  Physical Activity: Inactive (02/08/2021)   Received from [facility], [facility]  Social Connections: Socially Integrated (08/28/2023)  Stress: No Stress Concern Present  (02/08/2021)   [facility], [facility]  Tobacco Use: Low Risk  (08/28/2023)     Readmission Risk Interventions    08/30/2023   12:30 PM  Readmission Risk Prevention Plan  Post Dischage Appt Complete  Medication Screening Complete  Transportation Screening Complete

## 2023-09-08 NOTE — Progress Notes (Signed)
 Patients iv removed, belongings gathered, AVS/DNR form and prescription given to PTAR. Tried twice calling to give report with no success.

## 2023-09-08 NOTE — Progress Notes (Signed)
 Occupational Therapy Treatment Patient Details Name: Brian Ruiz MRN: [id number] DOB: 04-23-1946 Today's Date: 09/08/2023   History of present illness Brian Ruiz is a 78 y.o. male presents with SOB; admitted with acute respiratory failure with hypoxia, positive for covid19. Underwent cardioversion on 4/7. PMH: HTN, depression/anxiety, chronic pain syndrome with opioid dependence, scoliosis   OT comments  Patient with fair progress toward patient focused goals.  Still needing Max A for ADL completion, but able to perform sit to stand at [facility] level.  Mod A for pivotal steps to recliner.  Mod VC's for any transition.  OT to continue efforts in the acute setting to address deficits, and Patient will benefit from continued inpatient follow up therapy, <3 hours/day.      If plan is discharge home, recommend the following:  A lot of help with bathing/dressing/bathroom;Assistance with cooking/housework;Direct supervision/assist for medications management;Assist for transportation;Help with stairs or ramp for entrance;Direct supervision/assist for financial management   Equipment Recommendations  None recommended by OT    Recommendations for Other Services      Precautions / Restrictions Precautions Precautions: Fall Recall of Precautions/Restrictions: Impaired Precaution/Restrictions Comments: airborn/contact Restrictions Weight Bearing Restrictions Per Provider Order: No       Mobility Bed Mobility Overal bed mobility: Needs Assistance Bed Mobility: Supine to Sit     Supine to sit: HOB elevated, Used rails, Mod assist       Patient Response: Cooperative  Transfers Overall transfer level: Needs assistance Equipment used: Rolling walker (2 wheels) Transfers: Sit to/from Stand, Bed to chair/wheelchair/BSC Sit to Stand: Mod assist     Step pivot transfers: Mod assist           Balance Overall balance assessment: Needs assistance Sitting-balance support: Feet  supported Sitting balance-Leahy Scale: Fair     Standing balance support: Reliant on assistive device for balance Standing balance-Leahy Scale: Poor                             ADL either performed or assessed with clinical judgement   ADL               Lower Body Bathing: Maximal assistance;Sit to/from stand   Upper Body Dressing : Sitting;Minimal assistance       Toilet Transfer: Moderate assistance;Rolling walker (2 wheels);BSC/3in1                  Extremity/Trunk Assessment Upper Extremity Assessment Upper Extremity Assessment: Generalized weakness   Lower Extremity Assessment Lower Extremity Assessment: Defer to PT evaluation   Cervical / Trunk Assessment Cervical / Trunk Assessment: Kyphotic    Vision Patient Visual Report: No change from baseline     Perception Perception Perception: Not tested   Praxis Praxis Praxis: Not tested   Communication Communication Communication: No apparent difficulties Factors Affecting Communication: Hearing impaired   Cognition Arousal: Alert Behavior During Therapy: WFL for tasks assessed/performed Cognition: History of cognitive impairments       Memory impairment (select all impairments): Short-term memory                       Following commands: Intact        Cueing   Cueing Techniques: Verbal cues, Gestural cues, Tactile cues  Exercises      Shoulder Instructions       General Comments      Pertinent Vitals/ Pain       Pain Assessment  Faces Pain Scale: No hurt Pain Intervention(s): Monitored during session                                                          Frequency  Min 2X/week        Progress Toward Goals  OT Goals(current goals can now be found in the care plan section)  Progress towards OT goals: Progressing toward goals  Acute Rehab OT Goals OT Goal Formulation: With patient Time For Goal Achievement:  09/12/23 Potential to Achieve Goals: Fair  Plan      Co-evaluation                 AM-PAC OT "6 Clicks" Daily Activity     Outcome Measure   Help from another person eating meals?: A Little Help from another person taking care of personal grooming?: A Little Help from another person toileting, which includes using toliet, bedpan, or urinal?: A Lot Help from another person bathing (including washing, rinsing, drying)?: A Lot Help from another person to put on and taking off regular upper body clothing?: A Little Help from another person to put on and taking off regular lower body clothing?: A Lot 6 Click Score: 15    End of Session Equipment Utilized During Treatment: Rolling walker (2 wheels);Gait belt;Oxygen  OT Visit Diagnosis: Unsteadiness on feet (R26.81);Other abnormalities of gait and mobility (R26.89);Muscle weakness (generalized) (M62.81)   Activity Tolerance Patient tolerated treatment well   Patient Left in chair;with call bell/phone within reach;with family/visitor present   Nurse Communication Mobility status        Time: 1191-4782 OT Time Calculation (min): 30 min  Charges: OT General Charges $OT Visit: 1 Visit OT Treatments $Self Care/Home Management : 8-22 mins $Therapeutic Activity: 8-22 mins  09/08/2023  RP, OTR/L  Acute Rehabilitation Services  Office:  [phone]   Suzanna Obey 09/08/2023, 9:57 AM

## 2023-09-25 ENCOUNTER — Encounter (HOSPITAL_COMMUNITY): Admitting: Cardiology

## 2023-09-25 ENCOUNTER — Telehealth (HOSPITAL_COMMUNITY): Payer: Self-pay | Admitting: Internal Medicine

## 2023-09-25 NOTE — Telephone Encounter (Signed)
 Called to confirm/remind patient of their appointment at the [facility] on 09/25/23.   Appointment:   [x] Confirmed  [] Left mess   [] No answer/No voice mail  [] VM Full/unable to leave message  [] Phone not in service  Patient reminded to bring all medications and/or complete list.  Confirmed patient has transportation. Gave directions, instructed to utilize valet parking.

## 2023-09-26 ENCOUNTER — Ambulatory Visit (HOSPITAL_BASED_OUTPATIENT_CLINIC_OR_DEPARTMENT_OTHER)
Admission: RE | Admit: 2023-09-26 | Discharge: 2023-09-26 | Disposition: A | Discharge status: Home / Self Care | Source: Ambulatory Visit | Attending: Cardiology | Admitting: Cardiology

## 2023-09-26 VITALS — BP 110/60 | HR 70

## 2023-09-26 DIAGNOSIS — Z7409 Other reduced mobility: Secondary | ICD-10-CM | POA: Insufficient documentation

## 2023-09-26 DIAGNOSIS — I214 Non-ST elevation (NSTEMI) myocardial infarction: Secondary | ICD-10-CM

## 2023-09-26 DIAGNOSIS — Z79899 Other long term (current) drug therapy: Secondary | ICD-10-CM | POA: Insufficient documentation

## 2023-09-26 DIAGNOSIS — I5032 Chronic diastolic (congestive) heart failure: Secondary | ICD-10-CM | POA: Insufficient documentation

## 2023-09-26 DIAGNOSIS — I10 Essential (primary) hypertension: Secondary | ICD-10-CM | POA: Diagnosis not present

## 2023-09-26 DIAGNOSIS — F0390 Unspecified dementia without behavioral disturbance: Secondary | ICD-10-CM | POA: Insufficient documentation

## 2023-09-26 DIAGNOSIS — Z79890 Hormone replacement therapy (postmenopausal): Secondary | ICD-10-CM | POA: Insufficient documentation

## 2023-09-26 DIAGNOSIS — I5043 Acute on chronic combined systolic (congestive) and diastolic (congestive) heart failure: Secondary | ICD-10-CM

## 2023-09-26 DIAGNOSIS — K626 Ulcer of anus and rectum: Secondary | ICD-10-CM | POA: Diagnosis not present

## 2023-09-26 DIAGNOSIS — F32A Depression, unspecified: Secondary | ICD-10-CM | POA: Insufficient documentation

## 2023-09-26 DIAGNOSIS — I4891 Unspecified atrial fibrillation: Secondary | ICD-10-CM | POA: Insufficient documentation

## 2023-09-26 DIAGNOSIS — G894 Chronic pain syndrome: Secondary | ICD-10-CM | POA: Insufficient documentation

## 2023-09-26 DIAGNOSIS — K625 Hemorrhage of anus and rectum: Secondary | ICD-10-CM | POA: Diagnosis not present

## 2023-09-26 NOTE — Progress Notes (Signed)
   ADVANCED HEART FAILURE FOLLOW UP CLINIC NOTE  Referring Physician: [facility]  Primary Care: [facility] Primary Cardiologist:  HPI: Brian Ruiz is a 78 y.o. male who presents for follow up of of chronic systolic heart failure.      Had a previous medical history of depression, chronic pain syndrome with opiate dependence, and early dementia who presented in March 2025 with shortness of breath, hypoxia, in the setting of COVID-19 infection.  He was found to be in atrial fibrillation with RVR and cardiogenic shock.  He underwent right and left heart catheterization with clean coronary arteries, near normal filling pressures, and severely reduced cardiac index.  He was placed on IV milrinone  and placed in the ICU where he improved with supportive care.  He underwent TEE/DCCV prior to discharge, discharged to a skilled nursing facility given his debility.     SUBJECTIVE:  Patient overall reports that he has been doing fairly well.  He was discharged with a Foley catheter and just had this removed yesterday.  He had reportedly been taking his Farxiga  as this was prescribed at discharge.  He denies any worsening lower extremity swelling, orthopnea, cough, shortness of breath, chest pain.  He was relatively immobile prior to his hospitalization, his wife reporting that he has not left the house in about 3 years.  He has been more mobile and is hopeful to get home in the near future.  PMH, current medications, allergies, social history, and family history reviewed in epic.  PHYSICAL EXAM: Vitals:   09/26/23 1456  BP: 110/60  Pulse: 70  SpO2: 95%   GENERAL: Chronically ill-appearing, malnourished, cachectic PULM:  Normal work of breathing, clear to auscultation bilaterally. Respirations are unlabored.  CARDIAC:  JVP: Flat         Normal rate with regular rhythm. No murmurs, rubs or gallops.  No lower extremity edema. Warm and well perfused extremities. ABDOMEN:  Soft, non-tender, non-distended. NEUROLOGIC: Patient is oriented x3 with no focal or lateralizing neurologic deficits.    DATA REVIEW    ECHO: 09/27/23: LVEF 35 to 40%, severe concentric LVH, wall motion abnormalities consistent with Takotsubo cardiomyopathy, normal RV size and function  CATH: 09/01/2023: Normal coronary arteriography.  Normal filling pressures.  Severely reduced cardiac index and output, 1.2 by thermodilution   ASSESSMENT & PLAN:  Chronic systolic heart failure: Likely due to Takotsubo cardiomyopathy versus tachyarrhythmia based.  NYHA class IIIb symptoms, extremely poor functional status at baseline. - Stop SGLT2 given recent indwelling Foley - Continue torsemide  20 mg daily - Continue spironolactone  25 mg daily - No metoprolol  with recent decompensation - Would not be overly aggressive given dementia, extremely poor functional status  Atrial fibrillation: In the setting of COVID-19 infection and new systolic heart failure. - Currently in normal sinus rhythm - Continue amiodarone  200 mg daily, apixaban  5 mg twice daily - Check creatinine at next visit, may need dose reduction  Chronic pain: - Takes 4 mg Dilaudid  every 8 hours  Dementia:  - Follow up with PCP  Follow up in 6 months  Arta Lark, MD Advanced Heart Failure Mechanical Circulatory Support 09/26/23

## 2023-09-26 NOTE — Patient Instructions (Signed)
 STOP Farxiga    Your physician recommends that you schedule a follow-up appointment in: 6 months (October) ** PLEASE CALL THE OFFICE IN [place] TO ARRANGE YOUR FOLLOW UP APPOINTMENT.**  If you have any questions or concerns before your next appointment please send us  a message through Echo or call our office at [phone].    TO LEAVE A MESSAGE FOR THE NURSE SELECT OPTION 2, PLEASE LEAVE A MESSAGE INCLUDING: YOUR NAME DATE OF BIRTH CALL BACK NUMBER REASON FOR CALL**this is important as we prioritize the call backs  YOU WILL RECEIVE A CALL BACK THE SAME DAY AS LONG AS YOU CALL BEFORE 4:00 PM  At the [facility], you and your health needs are our priority. As part of our continuing mission to provide you with exceptional heart care, we have created designated Provider Care Teams. These Care Teams include your primary Cardiologist (physician) and Advanced Practice Providers (APPs- Physician Assistants and Nurse Practitioners) who all work together to provide you with the care you need, when you need it.   You may see any of the following providers on your designated Care Team at your next follow up: Dr Jules Oar Dr Peder Bourdon Dr. Alwin Baars Dr. Arta Lark Amy Marijane Shoulders, NP Ruddy Corral, [place] [facility] [place], [place] Dennise Fitz, NP [place] Lee, NP Shawnee Dellen, NP Luster Salters, PharmD Bevely Brush, PharmD   Please be sure to bring in all your medications bottles to every appointment.    Thank you for choosing [facility]

## 2023-09-27 ENCOUNTER — Encounter (HOSPITAL_COMMUNITY): Payer: Self-pay

## 2023-09-27 ENCOUNTER — Other Ambulatory Visit: Payer: Self-pay

## 2023-09-27 ENCOUNTER — Inpatient Hospital Stay (HOSPITAL_COMMUNITY)
Admission: EM | Admit: 2023-09-27 | Discharge: 2023-10-03 | DRG: 393 | Disposition: A | Discharge status: Skilled Nursing Facility | Source: Skilled Nursing Facility | Attending: Internal Medicine | Admitting: Internal Medicine

## 2023-09-27 DIAGNOSIS — D62 Acute posthemorrhagic anemia: Secondary | ICD-10-CM | POA: Diagnosis present

## 2023-09-27 DIAGNOSIS — F05 Delirium due to known physiological condition: Secondary | ICD-10-CM | POA: Diagnosis not present

## 2023-09-27 DIAGNOSIS — F0393 Unspecified dementia, unspecified severity, with mood disturbance: Secondary | ICD-10-CM | POA: Diagnosis present

## 2023-09-27 DIAGNOSIS — J690 Pneumonitis due to inhalation of food and vomit: Secondary | ICD-10-CM | POA: Diagnosis not present

## 2023-09-27 DIAGNOSIS — D509 Iron deficiency anemia, unspecified: Secondary | ICD-10-CM | POA: Diagnosis present

## 2023-09-27 DIAGNOSIS — K626 Ulcer of anus and rectum: Secondary | ICD-10-CM | POA: Diagnosis present

## 2023-09-27 DIAGNOSIS — K219 Gastro-esophageal reflux disease without esophagitis: Secondary | ICD-10-CM | POA: Diagnosis present

## 2023-09-27 DIAGNOSIS — R1310 Dysphagia, unspecified: Secondary | ICD-10-CM | POA: Diagnosis not present

## 2023-09-27 DIAGNOSIS — F32A Depression, unspecified: Secondary | ICD-10-CM | POA: Diagnosis present

## 2023-09-27 DIAGNOSIS — B370 Candidal stomatitis: Secondary | ICD-10-CM | POA: Diagnosis not present

## 2023-09-27 DIAGNOSIS — Z7409 Other reduced mobility: Secondary | ICD-10-CM | POA: Diagnosis present

## 2023-09-27 DIAGNOSIS — I4891 Unspecified atrial fibrillation: Secondary | ICD-10-CM

## 2023-09-27 DIAGNOSIS — Z9049 Acquired absence of other specified parts of digestive tract: Secondary | ICD-10-CM

## 2023-09-27 DIAGNOSIS — Z6824 Body mass index (BMI) 24.0-24.9, adult: Secondary | ICD-10-CM

## 2023-09-27 DIAGNOSIS — N1330 Unspecified hydronephrosis: Secondary | ICD-10-CM

## 2023-09-27 DIAGNOSIS — E1122 Type 2 diabetes mellitus with diabetic chronic kidney disease: Secondary | ICD-10-CM | POA: Diagnosis present

## 2023-09-27 DIAGNOSIS — Z8719 Personal history of other diseases of the digestive system: Secondary | ICD-10-CM

## 2023-09-27 DIAGNOSIS — Z8711 Personal history of peptic ulcer disease: Secondary | ICD-10-CM

## 2023-09-27 DIAGNOSIS — K5730 Diverticulosis of large intestine without perforation or abscess without bleeding: Secondary | ICD-10-CM | POA: Diagnosis not present

## 2023-09-27 DIAGNOSIS — D649 Anemia, unspecified: Secondary | ICD-10-CM | POA: Diagnosis not present

## 2023-09-27 DIAGNOSIS — E43 Unspecified severe protein-calorie malnutrition: Secondary | ICD-10-CM

## 2023-09-27 DIAGNOSIS — D125 Benign neoplasm of sigmoid colon: Secondary | ICD-10-CM | POA: Diagnosis present

## 2023-09-27 DIAGNOSIS — J9621 Acute and chronic respiratory failure with hypoxia: Secondary | ICD-10-CM | POA: Diagnosis not present

## 2023-09-27 DIAGNOSIS — N1831 Chronic kidney disease, stage 3a: Secondary | ICD-10-CM | POA: Diagnosis present

## 2023-09-27 DIAGNOSIS — F0394 Unspecified dementia, unspecified severity, with anxiety: Secondary | ICD-10-CM | POA: Diagnosis present

## 2023-09-27 DIAGNOSIS — K633 Ulcer of intestine: Secondary | ICD-10-CM

## 2023-09-27 DIAGNOSIS — M1990 Unspecified osteoarthritis, unspecified site: Secondary | ICD-10-CM | POA: Diagnosis present

## 2023-09-27 DIAGNOSIS — Z515 Encounter for palliative care: Secondary | ICD-10-CM

## 2023-09-27 DIAGNOSIS — G629 Polyneuropathy, unspecified: Secondary | ICD-10-CM | POA: Diagnosis present

## 2023-09-27 DIAGNOSIS — Z888 Allergy status to other drugs, medicaments and biological substances status: Secondary | ICD-10-CM

## 2023-09-27 DIAGNOSIS — Z9089 Acquired absence of other organs: Secondary | ICD-10-CM

## 2023-09-27 DIAGNOSIS — R5381 Other malaise: Secondary | ICD-10-CM | POA: Diagnosis present

## 2023-09-27 DIAGNOSIS — G894 Chronic pain syndrome: Secondary | ICD-10-CM | POA: Diagnosis present

## 2023-09-27 DIAGNOSIS — Z9889 Other specified postprocedural states: Secondary | ICD-10-CM

## 2023-09-27 DIAGNOSIS — I130 Hypertensive heart and chronic kidney disease with heart failure and stage 1 through stage 4 chronic kidney disease, or unspecified chronic kidney disease: Secondary | ICD-10-CM | POA: Diagnosis present

## 2023-09-27 DIAGNOSIS — I5022 Chronic systolic (congestive) heart failure: Secondary | ICD-10-CM | POA: Diagnosis present

## 2023-09-27 DIAGNOSIS — K5731 Diverticulosis of large intestine without perforation or abscess with bleeding: Secondary | ICD-10-CM | POA: Diagnosis present

## 2023-09-27 DIAGNOSIS — Z8616 Personal history of COVID-19: Secondary | ICD-10-CM

## 2023-09-27 DIAGNOSIS — D122 Benign neoplasm of ascending colon: Secondary | ICD-10-CM | POA: Diagnosis present

## 2023-09-27 DIAGNOSIS — K625 Hemorrhage of anus and rectum: Principal | ICD-10-CM

## 2023-09-27 DIAGNOSIS — I351 Nonrheumatic aortic (valve) insufficiency: Secondary | ICD-10-CM | POA: Diagnosis present

## 2023-09-27 DIAGNOSIS — K529 Noninfective gastroenteritis and colitis, unspecified: Secondary | ICD-10-CM | POA: Diagnosis present

## 2023-09-27 DIAGNOSIS — I480 Paroxysmal atrial fibrillation: Secondary | ICD-10-CM | POA: Diagnosis present

## 2023-09-27 DIAGNOSIS — K648 Other hemorrhoids: Secondary | ICD-10-CM | POA: Diagnosis not present

## 2023-09-27 DIAGNOSIS — Z885 Allergy status to narcotic agent status: Secondary | ICD-10-CM

## 2023-09-27 DIAGNOSIS — Z8679 Personal history of other diseases of the circulatory system: Secondary | ICD-10-CM

## 2023-09-27 DIAGNOSIS — Z539 Procedure and treatment not carried out, unspecified reason: Secondary | ICD-10-CM | POA: Diagnosis not present

## 2023-09-27 DIAGNOSIS — R64 Cachexia: Secondary | ICD-10-CM | POA: Diagnosis present

## 2023-09-27 DIAGNOSIS — E890 Postprocedural hypothyroidism: Secondary | ICD-10-CM | POA: Diagnosis present

## 2023-09-27 DIAGNOSIS — Z7901 Long term (current) use of anticoagulants: Secondary | ICD-10-CM | POA: Diagnosis not present

## 2023-09-27 DIAGNOSIS — Z79899 Other long term (current) drug therapy: Secondary | ICD-10-CM

## 2023-09-27 DIAGNOSIS — Z886 Allergy status to analgesic agent status: Secondary | ICD-10-CM

## 2023-09-27 DIAGNOSIS — Z66 Do not resuscitate: Secondary | ICD-10-CM | POA: Diagnosis present

## 2023-09-27 DIAGNOSIS — I2510 Atherosclerotic heart disease of native coronary artery without angina pectoris: Secondary | ICD-10-CM | POA: Diagnosis present

## 2023-09-27 DIAGNOSIS — K6289 Other specified diseases of anus and rectum: Secondary | ICD-10-CM | POA: Diagnosis not present

## 2023-09-27 DIAGNOSIS — E871 Hypo-osmolality and hyponatremia: Secondary | ICD-10-CM | POA: Diagnosis present

## 2023-09-27 DIAGNOSIS — R011 Cardiac murmur, unspecified: Secondary | ICD-10-CM | POA: Diagnosis present

## 2023-09-27 DIAGNOSIS — Z8249 Family history of ischemic heart disease and other diseases of the circulatory system: Secondary | ICD-10-CM

## 2023-09-27 DIAGNOSIS — E876 Hypokalemia: Secondary | ICD-10-CM | POA: Diagnosis present

## 2023-09-27 DIAGNOSIS — I252 Old myocardial infarction: Secondary | ICD-10-CM

## 2023-09-27 DIAGNOSIS — M419 Scoliosis, unspecified: Secondary | ICD-10-CM | POA: Diagnosis present

## 2023-09-27 DIAGNOSIS — M549 Dorsalgia, unspecified: Secondary | ICD-10-CM | POA: Diagnosis present

## 2023-09-27 DIAGNOSIS — F1120 Opioid dependence, uncomplicated: Secondary | ICD-10-CM | POA: Diagnosis present

## 2023-09-27 DIAGNOSIS — L24A0 Irritant contact dermatitis due to friction or contact with body fluids, unspecified: Secondary | ICD-10-CM | POA: Diagnosis present

## 2023-09-27 DIAGNOSIS — Z87892 Personal history of anaphylaxis: Secondary | ICD-10-CM

## 2023-09-27 DIAGNOSIS — Z8701 Personal history of pneumonia (recurrent): Secondary | ICD-10-CM

## 2023-09-27 DIAGNOSIS — K5900 Constipation, unspecified: Secondary | ICD-10-CM | POA: Diagnosis present

## 2023-09-27 LAB — CBC WITH DIFFERENTIAL/PLATELET
Abs Immature Granulocytes: 0.03 10*3/uL (ref 0.00–0.07)
Basophils Absolute: 0.1 10*3/uL (ref 0.0–0.1)
Basophils Relative: 1 %
Eosinophils Absolute: 0.6 10*3/uL — ABNORMAL HIGH (ref 0.0–0.5)
Eosinophils Relative: 7 %
HCT: 32.9 % — ABNORMAL LOW (ref 39.0–52.0)
Hemoglobin: 11 g/dL — ABNORMAL LOW (ref 13.0–17.0)
Immature Granulocytes: 0 %
Lymphocytes Relative: 5 %
Lymphs Abs: 0.5 10*3/uL — ABNORMAL LOW (ref 0.7–4.0)
MCH: 30.9 pg (ref 26.0–34.0)
MCHC: 33.4 g/dL (ref 30.0–36.0)
MCV: 92.4 fL (ref 80.0–100.0)
Monocytes Absolute: 0.9 10*3/uL (ref 0.1–1.0)
Monocytes Relative: 11 %
Neutro Abs: 6.7 10*3/uL (ref 1.7–7.7)
Neutrophils Relative %: 76 %
Platelets: 335 10*3/uL (ref 150–400)
RBC: 3.56 MIL/uL — ABNORMAL LOW (ref 4.22–5.81)
RDW: 13 % (ref 11.5–15.5)
WBC: 8.8 10*3/uL (ref 4.0–10.5)
nRBC: 0 % (ref 0.0–0.2)

## 2023-09-27 LAB — COMPREHENSIVE METABOLIC PANEL WITH GFR
ALT: 21 U/L (ref 0–44)
AST: 24 U/L (ref 15–41)
Albumin: 3.3 g/dL — ABNORMAL LOW (ref 3.5–5.0)
Alkaline Phosphatase: 64 U/L (ref 38–126)
Anion gap: 10 (ref 5–15)
BUN: 15 mg/dL (ref 8–23)
CO2: 28 mmol/L (ref 22–32)
Calcium: 8.8 mg/dL — ABNORMAL LOW (ref 8.9–10.3)
Chloride: 88 mmol/L — ABNORMAL LOW (ref 98–111)
Creatinine, Ser: 1.56 mg/dL — ABNORMAL HIGH (ref 0.61–1.24)
GFR, Estimated: 45 mL/min — ABNORMAL LOW (ref >60)
Glucose, Bld: 87 mg/dL (ref 70–99)
Potassium: 3.1 mmol/L — ABNORMAL LOW (ref 3.5–5.1)
Sodium: 126 mmol/L — ABNORMAL LOW (ref 135–145)
Total Bilirubin: 0.8 mg/dL (ref 0.0–1.2)
Total Protein: 6.2 g/dL — ABNORMAL LOW (ref 6.5–8.1)

## 2023-09-27 LAB — PROTIME-INR
INR: 1.5 — ABNORMAL HIGH (ref 0.8–1.2)
Prothrombin Time: 18.2 s — ABNORMAL HIGH (ref 11.4–15.2)

## 2023-09-27 LAB — POC OCCULT BLOOD, ED: Fecal Occult Bld: POSITIVE — AB

## 2023-09-27 LAB — HEMOGLOBIN AND HEMATOCRIT, BLOOD
HCT: 30 % — ABNORMAL LOW (ref 39.0–52.0)
HCT: 30.7 % — ABNORMAL LOW (ref 39.0–52.0)
HCT: 29.7 % — ABNORMAL LOW (ref 39.0–52.0)
Hemoglobin: 9.9 g/dL — ABNORMAL LOW (ref 13.0–17.0)
Hemoglobin: 10.4 g/dL — ABNORMAL LOW (ref 13.0–17.0)
Hemoglobin: 10.2 g/dL — ABNORMAL LOW (ref 13.0–17.0)

## 2023-09-27 LAB — TYPE AND SCREEN
ABO/RH(D): A POS
Antibody Screen: NEGATIVE

## 2023-09-27 LAB — ABO/RH: ABO/RH(D): A POS

## 2023-09-27 MED ORDER — ONDANSETRON HCL 4 MG/2ML IJ SOLN: 4.0000 mg | Freq: Four times a day (QID) | INTRAMUSCULAR | Status: DC | PRN

## 2023-09-27 MED ORDER — ACETAMINOPHEN 650 MG RE SUPP: 650.0000 mg | Freq: Four times a day (QID) | RECTAL | Status: DC | PRN

## 2023-09-27 MED ORDER — ONDANSETRON HCL 4 MG PO TABS: 4.0000 mg | ORAL_TABLET | Freq: Four times a day (QID) | ORAL | Status: DC | PRN

## 2023-09-27 MED ORDER — SERTRALINE HCL 50 MG PO TABS
75.0000 mg | ORAL_TABLET | Freq: Every day | ORAL | Status: DC
  Administered 2023-09-27 – 2023-10-01 (×5): 75 mg via ORAL
  Filled 2023-09-27 (×5): qty 2

## 2023-09-27 MED ORDER — AMIODARONE HCL 200 MG PO TABS
200.0000 mg | ORAL_TABLET | Freq: Every day | ORAL | Status: DC
  Administered 2023-09-27 – 2023-10-03 (×7): 200 mg via ORAL
  Filled 2023-09-27 (×8): qty 1

## 2023-09-27 MED ORDER — ACETAMINOPHEN 325 MG PO TABS: 650.0000 mg | ORAL_TABLET | Freq: Four times a day (QID) | ORAL | Status: DC | PRN

## 2023-09-27 MED ORDER — SODIUM CHLORIDE 0.9% FLUSH: 3.0000 mL | INTRAVENOUS | Status: DC | PRN

## 2023-09-27 MED ORDER — TAMSULOSIN HCL 0.4 MG PO CAPS
0.4000 mg | ORAL_CAPSULE | Freq: Every day | ORAL | Status: DC
  Administered 2023-09-28 – 2023-10-03 (×6): 0.4 mg via ORAL
  Filled 2023-09-27 (×6): qty 1

## 2023-09-27 MED ORDER — SODIUM CHLORIDE 0.9% FLUSH
3.0000 mL | Freq: Two times a day (BID) | INTRAVENOUS | Status: DC
  Administered 2023-09-27 – 2023-09-28 (×2): 10 mL via INTRAVENOUS
  Administered 2023-09-28: 3 mL via INTRAVENOUS
  Administered 2023-09-29 (×2): 10 mL via INTRAVENOUS
  Administered 2023-09-30: 3 mL via INTRAVENOUS
  Administered 2023-10-01 – 2023-10-03 (×5): 10 mL via INTRAVENOUS

## 2023-09-27 NOTE — Consult Note (Signed)
 Consultation Note   Referring Provider:  [facility] PCP: [facility] Primary Gastroenterologist::  Dr. Danella Dunn - Jayson Michael      Reason for Consultation: Lower GI bleed DOA: 09/27/2023         Hospital Day: 1   ASSESSMENT    78 y.o. 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  No family in room at the time of my visit .  Presenting hemoglobin 11, down from baseline of 13.6 though it was 12.6 on the 11th of this month while hospitalized for heart failure / NSTEMI and COVID. On exam there is red blood perianally.  On DRE I extracted some soft stool with orange hue but also red blood.  Rule out hemorrhoidal bleed, diverticular bleed, bleeding from polyp vr other  Atrial fibrillation. S/p cardioversion 09/04/23. Chronic Eliquis   Recent admission for acute systolic heart failure /  cardiogenic shock possibly related to Takotsubo cardiomyopathy versus a large LAD territorial infarct  Dementia  See PMH for additional history     PLAN:   --Monitor H&H --Home Eliquis  is on hold --Patient at increased risk for procedures with cardiac history.  However, I did talk with him about the possibility of a colonoscopy which he declines though I am not sure he makes his own medical decisions. --CT angio if has brisk bleeding --Clear liquid diet okay for now from GI standpoint   HPI   Patient has history of dementia, history comes from chart.  He was hospitalized earlier this month with COVID-19 , A-fib status post  TEE / DCCV and acute systolic heart failure presenting with cardiogenic shock .  Etiology was not clear but may be  Takotsubo versus a large LAD territory infarct .  He was discharged to [facility] on 09/08/2023  Patient was brought to ED from [facility] today for rectal bleeding.  He has a history of dementia, can only give a limited history.  He does tell me that he was told that he has been having blood coming out of his rectum for about 2 days.  He denies abdominal pain.  No rectal pain.  He tells me this is never happened to him before.   He is unable to tell me if he was constipated prior to the onset of rectal bleeding. He does not know what medications he takes at home nor when the last time he took home medications.  Patient tells me he has had colonoscopies in the past but cannot remember where when or where they were performed.   Workup thus far: Hemodynamically stable in the ED. hemoglobin 11, down from 12.6 on 09/08/2023 with a baseline around 13.  FOBT positive WBC normal. Sodium 126, potassium 3.1, BUN 15, creatinine 1.5 (baseline 1.7).  INR 1.5 (on Eliquis )  Previous GI Studies   Appears he had colonoscopy many years ago ([facility]) but unable to see the results.  It also appears he had an EGD in  2021 with Dr. Katopes for iron deficiency anemia and melena but results are not visible   Labs and Imaging:  Recent Labs    09/27/23 1007  WBC 8.8  HGB 11.0*  HCT 32.9*  MCV 92.4  PLT 335   No results for input(s): "FOLATE", "VITAMINB12", "FERRITIN", "TIBC", "IRONPCTSAT" in the last 72 hours. Recent Labs    09/27/23 1007  NA 126*  K 3.1*  CL 88*  CO2 28  GLUCOSE 87  BUN 15  CREATININE 1.56*  CALCIUM 8.8*   Recent Labs    09/27/23 1007  PROT 6.2*  ALBUMIN  3.3*  AST 24  ALT 21  ALKPHOS 64  BILITOT 0.8   Recent Labs    09/27/23 1007  INR 1.5*   No results for input(s): "AFPTUMOR" in the last 72 hours.  DG Chest 2 View CLINICAL DATA:  Fever.  Hemoptysis.  COVID-19.  EXAM: CHEST - 2 VIEW  COMPARISON:  Chest radiograph dated 05/15/2011.  FINDINGS: Mild  cardiomegaly with mild central vascular congestion. Elevation of the right hemidiaphragm. There are bibasilar atelectasis. Pneumonia is not excluded. No large pleural effusion. No pneumothorax. Atherosclerotic calcification of the aorta. No acute osseous pathology.  IMPRESSION: 1. Mild cardiomegaly with mild central vascular congestion. 2. Bibasilar atelectasis. Pneumonia is not excluded.  Electronically Signed   By: Angus Bark M.D.   On: 09/06/2023 14:01    Past Medical History:  Diagnosis Date   Arthritis    Back pain, chronic    intolerant to narcotics, avoids NSAIDs due to vioxx related bleed,  sees pain clinic as [facility], prior eval by [facility] neuro in [place]   Cholecystitis    Choledocholithiasis    Chronic diastolic CHF (congestive heart failure) (HCC) 05/16/2018   Per Dr Letta Raw note in 2019, pt did not have heart failure but hypertensive heart disease.  Azzie Bollman FNP-C   Encephalopathy acute 05/15/2011   Heart murmur    Hypertension    Inguinal hernia    Polyneuropathy    S/P thyroidectomy 1967   Scoliosis    Urinary retention     Past Surgical History:  Procedure Laterality Date   BACK SURGERY  1975   [id number]   CARDIOVERSION N/A 09/04/2023   Procedure: CARDIOVERSION;  Surgeon: Darlis Eisenmenger, MD;  Location: [facility] INVASIVE CV LAB;  Service: Cardiovascular;  Laterality: N/A;   CHOLECYSTECTOMY N/A 11/13/2020   Procedure: LAPAROSCOPIC CHOLECYSTECTOMY WITH INTRAOPERATIVE CHOLANGIOGRAM;  Surgeon: Dorena Gander, MD;  Location: [facility] OR;  Service: General;  Laterality: N/A;   COLONOSCOPY     FOOT SURGERY  1985, 2000   due to arthritis   HAND SURGERY  2005   left - due to arthritis   INGUINAL HERNIA REPAIR  04/30/2012   Procedure: HERNIA REPAIR INGUINAL ADULT;  Surgeon: Darcella Earnest, MD;  Location: [facility];  Service: General;  Laterality: Left;  repair left inguinal hernia   RIGHT/LEFT HEART CATH AND CORONARY ANGIOGRAPHY N/A 09/01/2023    Procedure: RIGHT/LEFT HEART CATH AND CORONARY ANGIOGRAPHY;  Surgeon: Lauralee Poll, MD;  Location: [facility] INVASIVE CV LAB;  Service: Cardiovascular;  Laterality: N/A;   THYROIDECTOMY  1967   TONSILLECTOMY     TRANSESOPHAGEAL ECHOCARDIOGRAM (CATH LAB) N/A 09/04/2023   Procedure: TRANSESOPHAGEAL ECHOCARDIOGRAM;  Surgeon: Darlis Eisenmenger, MD;  Location: [facility] INVASIVE CV LAB;  Service: Cardiovascular;  Laterality: N/A;    Family History  Problem Relation Age of Onset   Heart disease Father     Prior to  Admission medications   Medication Sig Start Date End Date Taking? Authorizing Provider  acetaminophen  (TYLENOL ) 500 MG tablet Take 1,000 mg by mouth every 8 (eight) hours as needed for mild pain (pain score 1-3) or moderate pain (pain score 4-6). For pain.   Yes [provider]  amiodarone  (PACERONE ) 200 MG tablet Take 200 mg by mouth daily.   Yes [provider]  apixaban  (ELIQUIS ) 5 MG TABS tablet Take 1 tablet (5 mg total) by mouth 2 (two) times daily. 09/08/23  Yes Deforest Fast, MD  gabapentin  (NEURONTIN ) 400 MG capsule Take 400 mg by mouth at bedtime as needed (back pain).   Yes [provider]  HYDROmorphone  (DILAUDID ) 4 MG tablet Take 1 tablet (4 mg total) by mouth every 8 (eight) hours. 09/08/23  Yes Deforest Fast, MD  Naphazoline HCl (CLEAR EYES OP) Place 1 drop into both eyes daily as needed (dry eye).   Yes [provider]  omeprazole (PRILOSEC) 40 MG capsule Take 40 mg by mouth daily. 09/07/20  Yes [provider]  oxymetazoline (AFRIN) 0.05 % nasal spray Place 1 spray into both nostrils as needed (nose bleeds).   Yes [provider]  sertraline  (ZOLOFT ) 25 MG tablet Take 75 mg by mouth daily.   Yes [provider]  spironolactone  (ALDACTONE ) 25 MG tablet Take 1 tablet (25 mg total) by mouth daily. 09/09/23  Yes Deforest Fast, MD  tamsulosin  (FLOMAX ) 0.4 MG CAPS capsule Take 1 capsule (0.4 mg total) by mouth daily after  breakfast. 09/09/23  Yes Deforest Fast, MD  torsemide  (DEMADEX ) 20 MG tablet Take 1 tablet (20 mg total) by mouth daily. 09/09/23  Yes Deforest Fast, MD  amiodarone  (PACERONE ) 400 MG tablet Take 400 Mg twice a day for 4 more days followed by 200 Mg twice a day for 7 days followed by 200 Mg daily Patient not taking: Reported on 09/27/2023 09/08/23   Deforest Fast, MD  Artificial Tear Ointment (DRY EYES OP) Apply 1 drop to eye daily as needed (dry eyes). Patient not taking: Reported on 09/27/2023    [provider]  dapagliflozin  propanediol (FARXIGA ) 10 MG TABS tablet Take 10 mg by mouth daily. Patient not taking: Reported on 09/27/2023    [provider]  senna-docusate (SENOKOT-S) 8.6-50 MG tablet Take 2 tablets by mouth 2 (two) times daily as needed for moderate constipation. 11/15/20   Haydee Lipa, MD    Current Facility-Administered Medications  Medication Dose Route Frequency Provider Last Rate Last Admin   acetaminophen  (TYLENOL ) tablet 650 mg  650 mg Oral Q6H PRN Patel, Ekta V, MD       Or   acetaminophen  (TYLENOL ) suppository 650 mg  650 mg Rectal Q6H PRN Patel, Ekta V, MD       amiodarone  (PACERONE ) tablet 200 mg  200 mg Oral Daily Patel, Ekta V, MD       ondansetron  (ZOFRAN ) tablet 4 mg  4 mg Oral Q6H PRN Lavanda Porter, MD       Or   ondansetron  (ZOFRAN ) injection 4 mg  4 mg Intravenous Q6H PRN Lavanda Porter, MD       sertraline  (ZOLOFT ) tablet 75 mg  75 mg Oral Daily Patel, Ekta V, MD       sodium chloride  flush (NS) 0.9 % injection 3-10 mL  3-10 mL Intravenous Q12H Lavanda Porter, MD       sodium chloride  flush (NS) 0.9 % injection 3-10 mL  3-10 mL Intravenous PRN  Lavanda Porter, MD       [START ON 09/28/2023] tamsulosin  (FLOMAX ) capsule 0.4 mg  0.4 mg Oral QPC breakfast Lavanda Porter, MD       Current Outpatient Medications  Medication Sig Dispense Refill   acetaminophen  (TYLENOL ) 500 MG tablet Take 1,000 mg by mouth every 8 (eight) hours as needed for  mild pain (pain score 1-3) or moderate pain (pain score 4-6). For pain.     amiodarone  (PACERONE ) 200 MG tablet Take 200 mg by mouth daily.     apixaban  (ELIQUIS ) 5 MG TABS tablet Take 1 tablet (5 mg total) by mouth 2 (two) times daily.     gabapentin  (NEURONTIN ) 400 MG capsule Take 400 mg by mouth at bedtime as needed (back pain).     HYDROmorphone  (DILAUDID ) 4 MG tablet Take 1 tablet (4 mg total) by mouth every 8 (eight) hours. 10 tablet 0   Naphazoline HCl (CLEAR EYES OP) Place 1 drop into both eyes daily as needed (dry eye).     omeprazole (PRILOSEC) 40 MG capsule Take 40 mg by mouth daily.     oxymetazoline (AFRIN) 0.05 % nasal spray Place 1 spray into both nostrils as needed (nose bleeds).     sertraline  (ZOLOFT ) 25 MG tablet Take 75 mg by mouth daily.     spironolactone  (ALDACTONE ) 25 MG tablet Take 1 tablet (25 mg total) by mouth daily.     tamsulosin  (FLOMAX ) 0.4 MG CAPS capsule Take 1 capsule (0.4 mg total) by mouth daily after breakfast.     torsemide  (DEMADEX ) 20 MG tablet Take 1 tablet (20 mg total) by mouth daily.     amiodarone  (PACERONE ) 400 MG tablet Take 400 Mg twice a day for 4 more days followed by 200 Mg twice a day for 7 days followed by 200 Mg daily (Patient not taking: Reported on 09/27/2023)     Artificial Tear Ointment (DRY EYES OP) Apply 1 drop to eye daily as needed (dry eyes). (Patient not taking: Reported on 09/27/2023)     dapagliflozin  propanediol (FARXIGA ) 10 MG TABS tablet Take 10 mg by mouth daily. (Patient not taking: Reported on 09/27/2023)     senna-docusate (SENOKOT-S) 8.6-50 MG tablet Take 2 tablets by mouth 2 (two) times daily as needed for moderate constipation. 30 tablet 0    Allergies as of 09/27/2023 - Review Complete 09/27/2023  Allergen Reaction Noted   Nsaids Shortness Of Breath 06/30/2011   Oxycodone  Other (See Comments) 05/15/2011   Tolmetin Shortness Of Breath 04/23/2012   Tramadol Anaphylaxis 11/26/2013   Fentanyl  Other (See Comments)  08/29/2023   Codeine Anxiety 06/30/2011    Social History   Socioeconomic History   Marital status: Married    Spouse name: Not on file   Number of children: Not on file   Years of education: Not on file   Highest education level: Not on file  Occupational History   Not on file  Tobacco Use   Smoking status: Never    Passive exposure: Never   Smokeless tobacco: Never  Vaping Use   Vaping status: Never Used  Substance and Sexual Activity   Alcohol use: No   Drug use: No   Sexual activity: Yes    Partners: Female  Other Topics Concern   Not on file  Social History Narrative   Quit ETOH > 20years ago   Disabled due to back pain   Non-smoker   Lives at home   Wife Winton   Son +  Social Drivers of Corporate investment banker Strain: Low Risk  (02/08/2021)   Received from [facility], [facility]   Overall Financial Resource Strain (CARDIA)    Difficulty of Paying Living Expenses: Not hard at all  Food Insecurity: No Food Insecurity (08/28/2023)   Hunger Vital Sign    Worried About Running Out of Food in the Last Year: Never true    Ran Out of Food in the Last Year: Never true  Transportation Needs: No Transportation Needs (08/28/2023)   PRAPARE - Administrator, Civil Service (Medical): No    Lack of Transportation (Non-Medical): No  Physical Activity: Inactive (02/08/2021)   Received from [facility], [facility]   Exercise Vital Sign    Days of Exercise per Week: 0 days    Minutes of Exercise per Session: 0 min  Stress: No Stress Concern Present (02/08/2021)   Received from [facility], [facility] of Occupational Health - Occupational Stress Questionnaire    Feeling of Stress : Not at all  Social Connections: Socially Integrated (08/28/2023)   Social Connection and Isolation Panel [NHANES]    Frequency of Communication with Friends and Family: Three times a week    Frequency of Social Gatherings with Friends and  Family: Three times a week    Attends Religious Services: More than 4 times per year    Active Member of Clubs or Organizations: Yes    Attends Banker Meetings: 1 to 4 times per year    Marital Status: Married  Catering manager Violence: Not At Risk (08/28/2023)   Humiliation, Afraid, Rape, and Kick questionnaire    Fear of Current or Ex-Partner: No    Emotionally Abused: No    Physically Abused: No    Sexually Abused: No     Code Status   Code Status: Limited: Do not attempt resuscitation (DNR) -DNR-LIMITED -Do Not Intubate/DNI   Review of Systems: All systems reviewed and negative except where noted in HPI.  Physical Exam: Vital signs in last 24 hours: Temp:  [98.3 F (36.8 C)] 98.3 F (36.8 C) (04/30 1005) Pulse Rate:  [67-70] 67 (04/30 1300) Resp:  [12-19] 19 (04/30 1300) BP: (110-127)/(60-86) 114/77 (04/30 1300) SpO2:  [95 %-100 %] 100 % (04/30 1300)    General:  Pleasant male in NAD Psych:  Cooperative. Normal mood and affect Eyes: Pupils equal Ears:  Normal auditory acuity Nose: No deformity, discharge or lesions Neck:  Supple, no masses felt Lungs:  Clear to auscultation.  Heart:  Regular rate, regular rhythm.  Abdomen:  Soft, nondistended, nontender, active bowel sounds, no masses felt Rectal : Red blood found perirectally.  On DRE there was a small amount of soft stool which had an orange hue but also some visible red blood .  Msk: Symmetrical without gross deformities.  Neurologic:  Alert, appropriate answers to questions though answers not necessarily accurate Skin:  Intact without significant lesions.    Intake/Output from previous day: No intake/output data recorded. Intake/Output this shift:  No intake/output data recorded.   Mai Schwalbe, NP-C   09/27/2023, 1:56 PM

## 2023-09-27 NOTE — H&P (Signed)
 History and Physical    Patient: Brian Ruiz:[id number] DOB: [birth year] DOA: 09/27/2023 DOS: the patient was seen and examined on 09/27/2023 PCP: [facility]  Patient coming from: Home Chief complaint: Chief Complaint  Patient presents with   Rectal Bleeding   HPI:  Brian Ruiz is a 78 y.o. male with past medical history  of essential hypertension, depression anxiety, pain syndrome was admitted at [facility] on 331 with generalized weakness shortness of breath patient was also positive for COVID at that time diagnosed with COVID-pneumonia along with respiratory failure.  Presented patient was in A-fib RVR and because of an NSTEMI with elevated troponins of 1919 patient was started on heparin  and amiodarone  drip cardiology was consulted and patient underwent cardiac catheter on 01 September 2023 patient had left heart cath was in cardiogenic shock management PCCM and cardiology.  Patient underwent left and right heart cath and was told it was within normal limits.  Patient was started on amiodarone  and Eliquis .  Patient comes in today with complaints of rectal bleeding that is bright red blood from his nursing facility.  Vitals are stable.  Blood pressure 120/88 heart rate is 60 O2 sats 93% on 2 L nasal cannula.   ED Course: Pt in ed at bedside  is alert awake oriented and is amenable to complete treatment as the most tomorrow and is DO NOT RESUSCITATE otherwise. Vital signs in the ED were notable for the following:  Vitals:   09/27/23 1005 09/27/23 1256  BP: 127/86 120/76  Pulse: 70 69  Temp: 98.3 F (36.8 C)   Resp: 18 12  SpO2: 100% 100%  TempSrc: Oral    >>ED evaluation thus far shows: EKG ordered and pending. CMP shows hypokalemia 3.1 CKD at 1.56 with a EGFR of 45, hyponatremia 126 with a chloride of 88, LFTs within normal limits.  09/07/23 05:47 09/08/23 05:00 09/27/23 10:07  Hemoglobin 13.6 12.6 (L) 11.0 (L)  (L): Data is abnormally lowCBC today shows anemia  with a hemoglobin of 11 see trend . ED MD and has contacted [facility] for consult.  >>While in the ED patient received the following: Medications - No data to display  Review of Systems  Gastrointestinal:  Positive for blood in stool.   Past Medical History:  Diagnosis Date   Arthritis    Back pain, chronic    intolerant to narcotics, avoids NSAIDs due to vioxx related bleed,  sees pain clinic as [facility], prior eval by [facility] neuro in [place]   Cholecystitis    Choledocholithiasis    Chronic diastolic CHF (congestive heart failure) (HCC) 05/16/2018   Per Dr Letta Raw note in 2019, pt did not have heart failure but hypertensive heart disease.  Azzie Bollman FNP-C   Encephalopathy acute 05/15/2011   Heart murmur    Hypertension    Inguinal hernia    Polyneuropathy    S/P thyroidectomy 1967   Scoliosis    Urinary retention    Past Surgical History:  Procedure Laterality Date   BACK SURGERY  1975   [id number]   CARDIOVERSION N/A 09/04/2023   Procedure: CARDIOVERSION;  Surgeon: Darlis Eisenmenger, MD;  Location: [facility] INVASIVE CV LAB;  Service: Cardiovascular;  Laterality: N/A;   CHOLECYSTECTOMY N/A 11/13/2020   Procedure: LAPAROSCOPIC CHOLECYSTECTOMY WITH INTRAOPERATIVE CHOLANGIOGRAM;  Surgeon: Dorena Gander, MD;  Location: [facility] OR;  Service: General;  Laterality: N/A;   COLONOSCOPY     FOOT SURGERY  1985, 2000   due to arthritis   HAND  SURGERY  2005   left - due to arthritis   INGUINAL HERNIA REPAIR  04/30/2012   Procedure: HERNIA REPAIR INGUINAL ADULT;  Surgeon: Darcella Earnest, MD;  Location:  SURGERY CENTER;  Service: General;  Laterality: Left;  repair left inguinal hernia   RIGHT/LEFT HEART CATH AND CORONARY ANGIOGRAPHY N/A 09/01/2023   Procedure: RIGHT/LEFT HEART CATH AND CORONARY ANGIOGRAPHY;  Surgeon: Lauralee Poll, MD;  Location: [facility] INVASIVE CV LAB;  Service: Cardiovascular;  Laterality: N/A;   THYROIDECTOMY  1967   TONSILLECTOMY     TRANSESOPHAGEAL ECHOCARDIOGRAM  (CATH LAB) N/A 09/04/2023   Procedure: TRANSESOPHAGEAL ECHOCARDIOGRAM;  Surgeon: Darlis Eisenmenger, MD;  Location: [facility] INVASIVE CV LAB;  Service: Cardiovascular;  Laterality: N/A;    reports that he has never smoked. He has never been exposed to tobacco smoke. He has never used smokeless tobacco. He reports that he does not drink alcohol and does not use drugs. Allergies  Allergen Reactions   Nsaids Shortness Of Breath    Other reaction(s): Bronchospasm (ALLERGY/intolerance)   Oxycodone  Other (See Comments)    Agitation. Per the any pain medication that has codeine in it causes that reaction. Other reaction(s): Other Agitation. Per the any pain medication that has codeine in it causes that reaction.   Tolmetin Shortness Of Breath   Tramadol Anaphylaxis   Fentanyl  Other (See Comments)   Codeine Anxiety    Other reaction(s): Bronchospasm (ALLERGY/intolerance), Other (See Comments) Irritable    Family History  Problem Relation Age of Onset   Heart disease Father    Prior to Admission medications   Medication Sig Start Date End Date Taking? Authorizing Provider  acetaminophen  (TYLENOL ) 500 MG tablet Take 1,000 mg by mouth every 8 (eight) hours as needed for mild pain (pain score 1-3) or moderate pain (pain score 4-6). For pain.   Yes [provider]  amiodarone  (PACERONE ) 200 MG tablet Take 200 mg by mouth daily.   Yes [provider]  apixaban  (ELIQUIS ) 5 MG TABS tablet Take 1 tablet (5 mg total) by mouth 2 (two) times daily. 09/08/23  Yes Deforest Fast, MD  gabapentin  (NEURONTIN ) 400 MG capsule Take 400 mg by mouth at bedtime as needed (back pain).   Yes [provider]  HYDROmorphone  (DILAUDID ) 4 MG tablet Take 1 tablet (4 mg total) by mouth every 8 (eight) hours. 09/08/23  Yes Deforest Fast, MD  Naphazoline HCl (CLEAR EYES OP) Place 1 drop into both eyes daily as needed (dry eye).   Yes [provider]  omeprazole (PRILOSEC) 40 MG capsule Take 40 mg  by mouth daily. 09/07/20  Yes [provider]  oxymetazoline (AFRIN) 0.05 % nasal spray Place 1 spray into both nostrils as needed (nose bleeds).   Yes [provider]  sertraline  (ZOLOFT ) 25 MG tablet Take 75 mg by mouth daily.   Yes [provider]  spironolactone  (ALDACTONE ) 25 MG tablet Take 1 tablet (25 mg total) by mouth daily. 09/09/23  Yes Deforest Fast, MD  tamsulosin  (FLOMAX ) 0.4 MG CAPS capsule Take 1 capsule (0.4 mg total) by mouth daily after breakfast. 09/09/23  Yes Deforest Fast, MD  torsemide  (DEMADEX ) 20 MG tablet Take 1 tablet (20 mg total) by mouth daily. 09/09/23  Yes Deforest Fast, MD  amiodarone  (PACERONE ) 400 MG tablet Take 400 Mg twice a day for 4 more days followed by 200 Mg twice a day for 7 days followed by 200 Mg daily Patient not taking: Reported on 09/27/2023 09/08/23  Deforest Fast, MD  Artificial Tear Ointment (DRY EYES OP) Apply 1 drop to eye daily as needed (dry eyes). Patient not taking: Reported on 09/27/2023    [provider]  dapagliflozin  propanediol (FARXIGA ) 10 MG TABS tablet Take 10 mg by mouth daily. Patient not taking: Reported on 09/27/2023    [provider]  senna-docusate (SENOKOT-S) 8.6-50 MG tablet Take 2 tablets by mouth 2 (two) times daily as needed for moderate constipation. 11/15/20   Haydee Lipa, MD                                                                                 Vitals:   09/27/23 1005 09/27/23 1256  BP: 127/86 120/76  Pulse: 70 69  Resp: 18 12  Temp: 98.3 F (36.8 C)   TempSrc: Oral   SpO2: 100% 100%   Physical Exam Vitals and nursing note reviewed.  Constitutional:      General: He is not in acute distress. HENT:     Head: Normocephalic and atraumatic.     Right Ear: Hearing normal.     Left Ear: Hearing normal.     Nose: Nose normal. No nasal deformity.     Mouth/Throat:     Lips: Pink.     Tongue: No lesions.     Pharynx: Oropharynx is clear.  Eyes:      General: Lids are normal.     Extraocular Movements: Extraocular movements intact.  Cardiovascular:     Rate and Rhythm: Normal rate and regular rhythm.     Heart sounds: Normal heart sounds.  Pulmonary:     Effort: Pulmonary effort is normal.     Breath sounds: Normal breath sounds.  Abdominal:     General: Bowel sounds are normal. There is no distension.     Palpations: Abdomen is soft. There is no mass.     Tenderness: There is no abdominal tenderness.  Musculoskeletal:     Right lower leg: No edema.     Left lower leg: No edema.  Skin:    General: Skin is warm.  Neurological:     General: No focal deficit present.     Mental Status: He is alert and oriented to person, place, and time.     Cranial Nerves: Cranial nerves 2-12 are intact.  Psychiatric:        Attention and Perception: Attention normal.        Mood and Affect: Mood normal.        Speech: Speech normal.        Behavior: Behavior normal. Behavior is cooperative.     Labs on Admission: I have personally reviewed following labs and imaging studies CBC: Recent Labs  Lab 09/27/23 1007  WBC 8.8  NEUTROABS 6.7  HGB 11.0*  HCT 32.9*  MCV 92.4  PLT 335   Basic Metabolic Panel: Recent Labs  Lab 09/27/23 1007  NA 126*  K 3.1*  CL 88*  CO2 28  GLUCOSE 87  BUN 15  CREATININE 1.56*  CALCIUM 8.8*   GFR: CrCl cannot be calculated (Unknown ideal weight.). Liver Function Tests: Recent Labs  Lab 09/27/23 1007  AST 24  ALT 21  ALKPHOS 64  BILITOT 0.8  PROT 6.2*  ALBUMIN  3.3*   No results for input(s): "LIPASE", "AMYLASE" in the last 168 hours. No results for input(s): "AMMONIA" in the last 168 hours. Coagulation Profile: Recent Labs  Lab 09/27/23 1007  INR 1.5*   Cardiac Enzymes: No results for input(s): "CKTOTAL", "CKMB", "CKMBINDEX", "TROPONINI" in the last 168 hours. BNP (last 3 results) No results for input(s): "PROBNP" in the last 8760 hours. HbA1C: No results for input(s): "HGBA1C"  in the last 72 hours. CBG: No results for input(s): "GLUCAP" in the last 168 hours. Lipid Profile: No results for input(s): "CHOL", "HDL", "LDLCALC", "TRIG", "CHOLHDL", "LDLDIRECT" in the last 72 hours. Thyroid Function Tests: No results for input(s): "TSH", "T4TOTAL", "FREET4", "T3FREE", "THYROIDAB" in the last 72 hours. Anemia Panel: No results for input(s): "VITAMINB12", "FOLATE", "FERRITIN", "TIBC", "IRON", "RETICCTPCT" in the last 72 hours. Urine analysis:    Component Value Date/Time   COLORURINE YELLOW 11/11/2020 0041   APPEARANCEUR CLEAR 11/11/2020 0041   LABSPEC 1.018 11/11/2020 0041   PHURINE 6.0 11/11/2020 0041   GLUCOSEU NEGATIVE 11/11/2020 0041   HGBUR SMALL (A) 11/11/2020 0041   BILIRUBINUR NEGATIVE 11/11/2020 0041   KETONESUR NEGATIVE 11/11/2020 0041   PROTEINUR NEGATIVE 11/11/2020 0041   UROBILINOGEN 0.2 05/15/2011 1151   NITRITE NEGATIVE 11/11/2020 0041   LEUKOCYTESUR NEGATIVE 11/11/2020 0041   Radiological Exams on Admission: No results found. Data Reviewed: Relevant notes from primary care and specialist visits, past discharge summaries as available in EHR, including Care Everywhere. Prior diagnostic testing as pertinent to current admission diagnoses, Updated medications and problem lists for reconciliation ED course, including vitals, labs, imaging, treatment and response to treatment,Triage notes, nursing and pharmacy notes and ED provider's notes Notable results as noted in HPI.Discussed case with EDMD/ ED APP/ or Specialty MD on call and as needed.  Assessment & Plan  >> Rectal bleeding: Tiny granuloma IV PPI, hold Eliquis , GI consult. >> Acute blood loss anemia :  >> Atrial fibrillation: EKG pending. Will continue patient on amiodarone .   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 Left ventricular ejection fraction, by estimation, is 30% . The left ventricle has moderate to severely decreased  function. The left ventricle demonstrates regional wall motion abnormalities with the function of basilar LV segments relatively preserved and mid to apical segments more hypokinetic. 2. Right ventricular systolic function is mildly reduced. The right ventricular size is normal. 3. Left atrial size was moderately dilated. No left atrial/ left atrial appendage thrombus was detected. 4. No PFO or ASD by color doppler. 5. There was prolapse of the posterior leaflet without flail segment. The mitral valve is abnormal. Mild mitral valve regurgitation. No evidence of mitral stenosis. 6. The aortic valve is tricuspid. Aortic valve regurgitation is moderate. No aortic stenosis is present.   >> CKD stage IIIa: Avoid contrast renally dose medications.   >> CAD/history of NSTEMI: Will monitor for chest pain or any concerning symptoms of unstable angina  or ischemia.    DVT prophylaxis:  SCDs Consults:  [facility] Advance Care Planning:    Code Status: Limited: Do not attempt resuscitation (DNR) -DNR-LIMITED -Do Not Intubate/DNI    Family Communication:  None Disposition Plan:  SNF Severity of Illness: The appropriate patient status for this patient is INPATIENT. Inpatient status is judged to be reasonable and necessary in order to provide the required intensity of service to ensure the patient's safety. The patient's presenting symptoms, physical exam findings, and  initial radiographic and laboratory data in the context of their chronic comorbidities is felt to place them at high risk for further clinical deterioration. Furthermore, it is not anticipated that the patient will be medically stable for discharge from the hospital within 2 midnights of admission.   * I certify that at the point of admission it is my clinical judgment that the patient will require inpatient hospital care spanning beyond 2 midnights from the point of admission due to high intensity of service, high risk for further  deterioration and high frequency of surveillance required.*  Unresulted Labs (From admission, onward)     Start     Ordered   09/28/23 0500  Comprehensive metabolic panel  Tomorrow morning,   R        09/27/23 1308   09/28/23 0500  CBC  Tomorrow morning,   R        09/27/23 1308   09/27/23 1309  Hemoglobin and hematocrit, blood  Now then every 4 hours,   R (with TIMED occurrences)      09/27/23 1308   09/27/23 1030  ABO/Rh  Once,   R        09/27/23 1030            Orders Placed This Encounter  Procedures   Comprehensive metabolic panel   CBC with Differential   Protime-INR   Comprehensive metabolic panel   CBC   Hemoglobin and hematocrit, blood   Diet NPO time specified Except for: Sips with Meds, Ice Chips   SCDs   Cardiac Monitoring Continuous x 24 hours Indications for use: Other; other indications for use: Monitor for ischemia   Vital signs   Notify physician (specify)   Mobility Protocol: No Restrictions RN to initiate protocols based on patient's level of care   Refer to Sidebar Report Refer to ICU, Med-Surg, Progressive, and Step-Down Mobility Protocol Sidebars   Initiate Adult Central Line Maintenance and Catheter Protocol for patients with central line (CVC, PICC, Port, Hemodialysis, Trialysis)   If patient diabetic or glucose greater than 140 notify physician for Sliding Scale Insulin Orders   Intake and Output   Do not place and if present remove PureWick   Initiate Oral Care Protocol   Initiate Carrier Fluid Protocol   RN may order General Admission PRN Orders utilizing "General Admission PRN medications" (through manage orders) for the following patient needs: allergy symptoms (Claritin), cold sores (Carmex), cough (Robitussin DM), eye irritation (Liquifilm Tears), hemorrhoids (Tucks), indigestion (Maalox), minor skin irritation (Hydrocortisone Cream), muscle pain Lovena Rubinstein Gay), nose irritation (saline nasal spray) and sore throat (Chloraseptic spray).   Place and  maintain sequential compression device   Strict intake and output   Do not attempt resuscitation (DNR)- Limited -Do Not Intubate (DNI)   Consult to hospitalist   Pulse oximetry check with vital signs   Oxygen therapy Mode or (Route): Nasal cannula; Liters Per Minute: 2; Keep O2 saturation between: greater than 92 %   POC occult blood, ED   Type and screen [facility]   ABO/Rh   Saline lock IV   Admit to Inpatient (patient's expected length of stay will be greater than 2 midnights or inpatient only procedure)   Aspiration precautions    Author: Lavanda Porter, MD 12 pm -8 pm. 09/27/2023 1:08 PM >>Please note for any concern,or critical results after hours past 8pm please contact the [facility] hospitalist [facility] floor coverage provider from 7 PM- 7 AM. For on call review [email or web address],  username [email or web address] and PW: your phone number<<

## 2023-09-27 NOTE — Progress Notes (Signed)
 [facility] [address] 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   Liaisons will continue to follow for discharge disposition.   Please call with any Integrated Health Services related questions or concerns.   Thank you, Ardine Beckwith, [facility] liaison  [phone]

## 2023-09-27 NOTE — ED Triage Notes (Signed)
 Pt BIB [facility] from [facility] with c/o rectal bleeding. Staff at facility noticed this morning flecks of bright red blood. VSS with BP 120/88 and HR 60. Pt is on 2LNC at baseline, 93% on 2L. Aox4 and bedbound at baseline.

## 2023-09-27 NOTE — Plan of Care (Signed)
  Problem: Clinical Measurements: Goal: Ability to maintain clinical measurements within normal limits will improve Outcome: Progressing Goal: Will remain free from infection Outcome: Progressing Goal: Diagnostic test results will improve Outcome: Progressing Goal: Respiratory complications will improve Outcome: Progressing Goal: Cardiovascular complication will be avoided Outcome: Progressing   Problem: Activity: Goal: Risk for activity intolerance will decrease Outcome: Progressing   Problem: Nutrition: Goal: Adequate nutrition will be maintained Outcome: Progressing   Problem: Coping: Goal: Level of anxiety will decrease Outcome: Progressing   Problem: Elimination: Goal: Will not experience complications related to bowel motility Outcome: Progressing Goal: Will not experience complications related to urinary retention Outcome: Progressing   Problem: Pain Managment: Goal: General experience of comfort will improve and/or be controlled Outcome: Progressing

## 2023-09-27 NOTE — ED Provider Notes (Signed)
 Emergency Department Provider Note   I have reviewed the triage vital signs and the nursing notes.   HISTORY  Chief Complaint Rectal Bleeding   HPI Brian Ruiz is a 78 y.o. male with past history reviewed below including CHF, A-fib on Eliquis  presents to the emergency department with bright red blood per rectum noticed by staff at [facility].  He is there recovering after recent admission with discharge on 4/11.  He apparently went into A-fib with RVR while admitted and started on Eliquis  in that setting.  He states he has been feeling okay but when staff noticed the blood they called EMS for transport. Denies any CP or Abd pain.    Past Medical History:  Diagnosis Date   Arthritis    Back pain, chronic    intolerant to narcotics, avoids NSAIDs due to vioxx related bleed,  sees pain clinic as [place], prior eval by [facility] neuro in [place]   Cholecystitis    Choledocholithiasis    Chronic diastolic CHF (congestive heart failure) (HCC) 05/16/2018   Per Dr Letta Raw note in 2019, pt did not have heart failure but hypertensive heart disease.  Azzie Bollman FNP-C   Encephalopathy acute 05/15/2011   Heart murmur    Hypertension    Inguinal hernia    Polyneuropathy    S/P thyroidectomy 1967   Scoliosis    Urinary retention     Review of Systems  Constitutional: No fever/chills Cardiovascular: Denies chest pain. Respiratory: Denies shortness of breath. Gastrointestinal: No abdominal pain.  No nausea, no vomiting. Positive BRBPR.  Skin: Negative for rash. Neurological: Negative for headaches.  ____________________________________________   PHYSICAL EXAM:  VITAL SIGNS: ED Triage Vitals [09/27/23 1005]  Encounter Vitals Group     BP 127/86     Pulse Rate 70     Resp 18     Temp 98.3 F (36.8 C)     Temp Source Oral     SpO2 100 %   Constitutional: Alert. Well appearing and in no acute distress. Eyes: Conjunctivae are normal.  Head: Atraumatic. Nose: No  congestion/rhinnorhea. Mouth/Throat: Mucous membranes are moist.   Neck: No stridor.   Cardiovascular: Normal rate, regular rhythm. Good peripheral circulation. Grossly normal heart sounds.   Respiratory: Normal respiratory effort.  No retractions. Lungs CTAB. Gastrointestinal: Soft and nontender. No distention.  Rectal exam performed with nurse chaperone at bedside.  No fissures or hemorrhoids visible.  Patient does have bright red blood covering the finger on DRE. No melena.  Musculoskeletal: No gross deformities of extremities. Neurologic:  Normal speech and language.  Skin:  Skin is warm, dry and intact. No rash noted.  ____________________________________________   LABS (all labs ordered are listed, but only abnormal results are displayed)  Labs Reviewed  COMPREHENSIVE METABOLIC PANEL WITH GFR - Abnormal; Notable for the following components:      Result Value   Sodium 126 (*)    Potassium 3.1 (*)    Chloride 88 (*)    Creatinine, Ser 1.56 (*)    Calcium 8.8 (*)    Total Protein 6.2 (*)    Albumin  3.3 (*)    GFR, Estimated 45 (*)    All other components within normal limits  CBC WITH DIFFERENTIAL/PLATELET - Abnormal; Notable for the following components:   RBC 3.56 (*)    Hemoglobin 11.0 (*)    HCT 32.9 (*)    Lymphs Abs 0.5 (*)    Eosinophils Absolute 0.6 (*)    All other  components within normal limits  PROTIME-INR - Abnormal; Notable for the following components:   Prothrombin Time 18.2 (*)    INR 1.5 (*)    All other components within normal limits  HEMOGLOBIN AND HEMATOCRIT, BLOOD - Abnormal; Notable for the following components:   Hemoglobin 9.9 (*)    HCT 30.0 (*)    All other components within normal limits  HEMOGLOBIN AND HEMATOCRIT, BLOOD - Abnormal; Notable for the following components:   Hemoglobin 10.4 (*)    HCT 30.7 (*)    All other components within normal limits  HEMOGLOBIN AND HEMATOCRIT, BLOOD - Abnormal; Notable for the following components:    Hemoglobin 10.2 (*)    HCT 29.7 (*)    All other components within normal limits  COMPREHENSIVE METABOLIC PANEL WITH GFR - Abnormal; Notable for the following components:   Sodium 130 (*)    Potassium 3.2 (*)    Chloride 91 (*)    Creatinine, Ser 1.42 (*)    Total Protein 5.7 (*)    Albumin  3.0 (*)    GFR, Estimated 51 (*)    All other components within normal limits  CBC - Abnormal; Notable for the following components:   RBC 3.24 (*)    Hemoglobin 10.2 (*)    HCT 29.7 (*)    All other components within normal limits  POC OCCULT BLOOD, ED - Abnormal; Notable for the following components:   Fecal Occult Bld POSITIVE (*)    All other components within normal limits  TYPE AND SCREEN  ABO/RH   ____________________________________________   PROCEDURES  Procedure(s) performed:   Procedures  None  ____________________________________________   INITIAL IMPRESSION / ASSESSMENT AND PLAN / ED COURSE  Pertinent labs & imaging results that were available during my care of the patient were reviewed by me and considered in my medical decision making (see chart for details).   This patient is Presenting for Evaluation of GI bleed, which does require a range of treatment options, and is a complaint that involves a high risk of morbidity and mortality.  The Differential Diagnoses include lower GI bleed, internal hemorrhoids, colitis, AVM, etc.  Critical Interventions-    Medications  sertraline  (ZOLOFT ) tablet 75 mg (75 mg Oral Given 09/27/23 1448)  tamsulosin  (FLOMAX ) capsule 0.4 mg (has no administration in time range)  amiodarone  (PACERONE ) tablet 200 mg (200 mg Oral Given 09/27/23 1448)  acetaminophen  (TYLENOL ) tablet 650 mg (has no administration in time range)    Or  acetaminophen  (TYLENOL ) suppository 650 mg (has no administration in time range)  ondansetron  (ZOFRAN ) tablet 4 mg (has no administration in time range)    Or  ondansetron  (ZOFRAN ) injection 4 mg (has no  administration in time range)  sodium chloride  flush (NS) 0.9 % injection 3-10 mL (10 mLs Intravenous Given 09/27/23 2212)  sodium chloride  flush (NS) 0.9 % injection 3-10 mL (has no administration in time range)    Reassessment after intervention:  no large volume bleeding.   I decided to review pertinent External Data, and in summary patient appears to be on Eliquis  since recent admit with A fib.   Clinical Laboratory Tests Ordered, included fecal occult positive.  Creatinine 1.56 similar to prior.  Anemia to 11.0 down from 12.6.  Radiologic Tests: Considered CT abdomen pelvis but patient without focal tenderness or heavy/large-volume bleeding to prompt bleeding scan.  Cardiac Monitor Tracing which shows NSR.    Social Determinants of Health Risk patient is a non-smoker.   Consult complete  with [facility]. They will consult.   TRH. Plan for admit.   Medical Decision Making: Summary:  The patient presents emergency department for evaluation of bright red blood per rectum noticed this morning by staff.  No symptoms currently.  Nontender exam.  No brisk bleeding to strongly suspect diverticular bleed.  Patient is newly started on Eliquis  which may be contributing.  Plan for screening blood work.  No hypotension requiring massive transfusion or emergent GI consultation.  Reevaluation with update and discussion with patient. Plan for admit. They are in agreement.   Patient's presentation is most consistent with acute presentation with potential threat to life or bodily function.   Disposition: admit  ____________________________________________  FINAL CLINICAL IMPRESSION(S) / ED DIAGNOSES  Final diagnoses:  Rectal bleeding    Note:  This document was prepared using Dragon voice recognition software and may include unintentional dictation errors.  Abby Hocking, MD, [facility] Emergency Medicine    Ramal Eckhardt, Shereen Dike, MD 09/28/23 [phone]

## 2023-09-28 DIAGNOSIS — D62 Acute posthemorrhagic anemia: Secondary | ICD-10-CM

## 2023-09-28 DIAGNOSIS — K625 Hemorrhage of anus and rectum: Secondary | ICD-10-CM | POA: Diagnosis not present

## 2023-09-28 DIAGNOSIS — Z7901 Long term (current) use of anticoagulants: Secondary | ICD-10-CM | POA: Diagnosis not present

## 2023-09-28 DIAGNOSIS — D649 Anemia, unspecified: Secondary | ICD-10-CM | POA: Diagnosis not present

## 2023-09-28 DIAGNOSIS — I4891 Unspecified atrial fibrillation: Secondary | ICD-10-CM | POA: Diagnosis not present

## 2023-09-28 LAB — CBC
HCT: 29.7 % — ABNORMAL LOW (ref 39.0–52.0)
HCT: 31.2 % — ABNORMAL LOW (ref 39.0–52.0)
Hemoglobin: 10.2 g/dL — ABNORMAL LOW (ref 13.0–17.0)
Hemoglobin: 10.4 g/dL — ABNORMAL LOW (ref 13.0–17.0)
MCH: 31.5 pg (ref 26.0–34.0)
MCH: 30.9 pg (ref 26.0–34.0)
MCHC: 34.3 g/dL (ref 30.0–36.0)
MCHC: 33.3 g/dL (ref 30.0–36.0)
MCV: 91.7 fL (ref 80.0–100.0)
MCV: 92.6 fL (ref 80.0–100.0)
Platelets: 337 10*3/uL (ref 150–400)
Platelets: 340 10*3/uL (ref 150–400)
RBC: 3.24 MIL/uL — ABNORMAL LOW (ref 4.22–5.81)
RBC: 3.37 MIL/uL — ABNORMAL LOW (ref 4.22–5.81)
RDW: 13.2 % (ref 11.5–15.5)
RDW: 13.1 % (ref 11.5–15.5)
WBC: 9.1 10*3/uL (ref 4.0–10.5)
WBC: 8.2 10*3/uL (ref 4.0–10.5)
nRBC: 0 % (ref 0.0–0.2)
nRBC: 0 % (ref 0.0–0.2)

## 2023-09-28 LAB — COMPREHENSIVE METABOLIC PANEL WITH GFR
ALT: 16 U/L (ref 0–44)
AST: 17 U/L (ref 15–41)
Albumin: 3 g/dL — ABNORMAL LOW (ref 3.5–5.0)
Alkaline Phosphatase: 56 U/L (ref 38–126)
Anion gap: 13 (ref 5–15)
BUN: 14 mg/dL (ref 8–23)
CO2: 26 mmol/L (ref 22–32)
Calcium: 8.9 mg/dL (ref 8.9–10.3)
Chloride: 91 mmol/L — ABNORMAL LOW (ref 98–111)
Creatinine, Ser: 1.42 mg/dL — ABNORMAL HIGH (ref 0.61–1.24)
GFR, Estimated: 51 mL/min — ABNORMAL LOW (ref >60)
Glucose, Bld: 77 mg/dL (ref 70–99)
Potassium: 3.2 mmol/L — ABNORMAL LOW (ref 3.5–5.1)
Sodium: 130 mmol/L — ABNORMAL LOW (ref 135–145)
Total Bilirubin: 1 mg/dL (ref 0.0–1.2)
Total Protein: 5.7 g/dL — ABNORMAL LOW (ref 6.5–8.1)

## 2023-09-28 LAB — HEMOGLOBIN AND HEMATOCRIT, BLOOD
HCT: 30.1 % — ABNORMAL LOW (ref 39.0–52.0)
Hemoglobin: 9.9 g/dL — ABNORMAL LOW (ref 13.0–17.0)

## 2023-09-28 MED ORDER — SIMETHICONE 80 MG PO CHEW
240.0000 mg | CHEWABLE_TABLET | Freq: Once | ORAL | Status: AC
  Administered 2023-09-28: 240 mg via ORAL
  Filled 2023-09-28: qty 3

## 2023-09-28 MED ORDER — HYDROMORPHONE HCL 2 MG PO TABS
4.0000 mg | ORAL_TABLET | Freq: Three times a day (TID) | ORAL | Status: DC | PRN
  Administered 2023-10-01: 4 mg via ORAL
  Filled 2023-09-28: qty 2

## 2023-09-28 MED ORDER — BOOST / RESOURCE BREEZE PO LIQD CUSTOM
1.0000 | Freq: Three times a day (TID) | ORAL | Status: DC
  Administered 2023-09-28 – 2023-10-03 (×9): 1 via ORAL

## 2023-09-28 MED ORDER — NA SULFATE-K SULFATE-MG SULF 17.5-3.13-1.6 GM/177ML PO SOLN
0.5000 | Freq: Once | ORAL | Status: AC
  Administered 2023-09-28: 177 mL via ORAL
  Filled 2023-09-28: qty 1

## 2023-09-28 MED ORDER — NA SULFATE-K SULFATE-MG SULF 17.5-3.13-1.6 GM/177ML PO SOLN
0.5000 | Freq: Once | ORAL | Status: AC
  Administered 2023-09-28: 177 mL via ORAL

## 2023-09-28 MED ORDER — POTASSIUM CHLORIDE CRYS ER 20 MEQ PO TBCR
40.0000 meq | EXTENDED_RELEASE_TABLET | Freq: Two times a day (BID) | ORAL | Status: AC
  Administered 2023-09-28 (×2): 40 meq via ORAL
  Filled 2023-09-28 (×2): qty 2

## 2023-09-28 MED ORDER — SPIRONOLACTONE 25 MG PO TABS
25.0000 mg | ORAL_TABLET | Freq: Every day | ORAL | Status: DC
  Administered 2023-09-28: 25 mg via ORAL
  Filled 2023-09-28: qty 1

## 2023-09-28 MED ORDER — TORSEMIDE 20 MG PO TABS
20.0000 mg | ORAL_TABLET | Freq: Every day | ORAL | Status: DC
  Administered 2023-09-28: 20 mg via ORAL
  Filled 2023-09-28: qty 1

## 2023-09-28 NOTE — Plan of Care (Signed)

## 2023-09-28 NOTE — NC FL2 (Signed)
 [place]  MEDICAID FL2 LEVEL OF CARE FORM     IDENTIFICATION  Patient Name: Brian Ruiz Birthdate: [birth year] Sex: male Admission Date (Current Location): 09/27/2023  [place] and [place] Number:  Producer, television/film/video and Address:  [facility] [place], [address] [place], [place] [address]      Provider Number: [id number]  Attending Physician Name and Address:  Deforest Fast, MD  Relative Name and Phone Number:  Willetta Harpin (spouse) [phone]    Current Level of Care: Hospital Recommended Level of Care: Skilled Nursing Facility Prior Approval Number:    Date Approved/Denied:   PASRR Number: [id number]  Discharge Plan: SNF    Current Diagnoses: Patient Active Problem List   Diagnosis Date Noted   Gastroesophageal reflux disease 09/27/2023   Rectal bleeding 09/27/2023   NSTEMI (non-ST elevated myocardial infarction) (HCC) 08/31/2023   Acute metabolic encephalopathy 08/30/2023   Acute respiratory failure with hypoxia (HCC) 08/28/2023   COVID-19 virus infection 08/28/2023   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   Depression with anxiety 09/12/2021   Palliative care encounter 08/03/2021   Physical debility 08/03/2021   Severe protein-calorie malnutrition (HCC) 08/03/2021   Gastrointestinal hemorrhage with melena 06/13/2019   Acute on chronic combined systolic and diastolic CHF (congestive heart failure) (HCC) 05/16/2018   Chronic fatigue 06/14/2017   Palpitations 06/14/2017   Aortic ectasia (HCC) 11/15/2016   Age-related osteoporosis without current pathological fracture 10/27/2016   Screening for AAA (abdominal aortic aneurysm) 10/27/2016   Screening for prostate cancer 10/27/2016   Need for hepatitis C screening test 10/27/2016   Encounter for general adult medical examination without abnormal findings 11/26/2013   Screening for osteoporosis 11/26/2013   Kyphoscoliosis deformity of spine 07/04/2013   Enlarged prostate  with lower urinary tract symptoms (LUTS) 04/12/2013   Screen for colon cancer 04/12/2013   Undiagnosed cardiac murmurs 04/12/2013   Backache 04/12/2013   White coat syndrome with hypertension 04/12/2013   Lumbar spinal stenosis 10/29/2012   DDD (degenerative disc disease), lumbosacral 03/16/2012   Lumbosacral radiculopathy 03/16/2012   Generalized anxiety disorder 03/14/2012   Renal pain 12/16/2011   Sacroiliitis, not elsewhere classified (HCC) 08/11/2011   Sacroiliac joint pain 07/27/2011   Bilateral hip pain 07/07/2011   Chronic pain syndrome 07/07/2011   Spondylolisthesis of lumbosacral region 07/07/2011   Hereditary and idiopathic peripheral neuropathy 05/27/2011   Accidental drug overdose 05/15/2011   Guilty feelings 05/15/2011   Essential hypertension 05/15/2011   Hypotension 05/15/2011   Bradycardia 05/15/2011   Chronic back pain 05/15/2011   Vitamin D deficiency 03/08/2011    Orientation RESPIRATION BLADDER Height & Weight     Self, Time  O2 (Nasal Cannula 2 liters) Continent Weight: 177 lb 0.5 oz (80.3 kg) Height:  5\' 11"  (180.3 cm)  BEHAVIORAL SYMPTOMS/MOOD NEUROLOGICAL BOWEL NUTRITION STATUS      Incontinent Diet (Please see discharge summary)  AMBULATORY STATUS COMMUNICATION OF NEEDS Skin   Extensive Assist Verbally Other (Comment) (Blister,heel,L,Ecchymosis,arm,Bil.,Erythema,perineum,sacrum,Bil.Wound/Incision LDAs,Wound/Incision open or dehisced Irritant dermatitis,MASD perineum,sacrum,Bil.,Wound/Incision open or dehisced heel,L)                       Personal Care Assistance Level of Assistance    Bathing Assistance: Maximum assistance Feeding assistance: Limited assistance Dressing Assistance: Maximum assistance Total Care Assistance: Maximum assistance   Functional Limitations Info    Sight Info: Impaired Hearing Info: Impaired Speech Info: Adequate    SPECIAL CARE FACTORS FREQUENCY  PT (By licensed PT), OT (By licensed  OT)     PT Frequency: 5x  min weekly OT Frequency: 5x min weekly            Contractures Contractures Info: Not present    Additional Factors Info  Code Status, Allergies, Psychotropic Code Status Info: DNR Allergies Info: Nsaids,Oxycodone ,Tolmetin,Tramadol,Fentanyl ,Codeine Psychotropic Info: sertraline  (ZOLOFT ) tablet 75 mg daily         Current Medications (09/28/2023):  This is the current hospital active medication list Current Facility-Administered Medications  Medication Dose Route Frequency Provider Last Rate Last Admin   acetaminophen  (TYLENOL ) tablet 650 mg  650 mg Oral Q6H PRN Patel, Ekta V, MD       Or   acetaminophen  (TYLENOL ) suppository 650 mg  650 mg Rectal Q6H PRN Patel, Ekta V, MD       amiodarone  (PACERONE ) tablet 200 mg  200 mg Oral Daily Patel, Ekta V, MD   200 mg at 09/28/23 1100   feeding supplement (BOOST / RESOURCE BREEZE) liquid 1 Container  1 Container Oral TID BM Acharya, Gayatri A, MD       HYDROmorphone  (DILAUDID ) tablet 4 mg  4 mg Oral Q8H PRN Deforest Fast, MD       ondansetron  (ZOFRAN ) tablet 4 mg  4 mg Oral Q6H PRN Patel, Ekta V, MD       Or   ondansetron  (ZOFRAN ) injection 4 mg  4 mg Intravenous Q6H PRN Patel, Ekta V, MD       potassium chloride  SA (KLOR-CON  M) CR tablet 40 mEq  40 mEq Oral BID Deforest Fast, MD   40 mEq at 09/28/23 1100   sertraline  (ZOLOFT ) tablet 75 mg  75 mg Oral Daily Patel, Ekta V, MD   75 mg at 09/28/23 1100   sodium chloride  flush (NS) 0.9 % injection 3-10 mL  3-10 mL Intravenous Q12H Brunilda Capra V, MD   10 mL at 09/28/23 1100   sodium chloride  flush (NS) 0.9 % injection 3-10 mL  3-10 mL Intravenous PRN Lavanda Porter, MD       spironolactone  (ALDACTONE ) tablet 25 mg  25 mg Oral Daily Joseph, Preetha, MD   25 mg at 09/28/23 1100   tamsulosin  (FLOMAX ) capsule 0.4 mg  0.4 mg Oral QPC breakfast Brunilda Capra V, MD   0.4 mg at 09/28/23 1100   torsemide  (DEMADEX ) tablet 20 mg  20 mg Oral Daily Joseph, Preetha, MD   20 mg at 09/28/23 1100      Discharge Medications: Please see discharge summary for a list of discharge medications.  Relevant Imaging Results:  Relevant Lab Results:   Additional Information [id number]  Carmon Christen, LCSWA

## 2023-09-28 NOTE — TOC Progression Note (Signed)
 Transition of Care [facility]) - Progression Note    Patient Details  Name: Brian Ruiz MRN: [id number] Date of Birth: Sep 04, 1945  [facility]) CM/SW Contact  Carmon Christen, LCSWA Phone Number: 09/28/2023, 1:35 PM  Clinical Narrative:     CSW faxed [facility] to [facility] for SN[facility] placement for patient. CSW spoke with Star with [facility] who confirmed SNF bed for patient. CSW will continue to follow and assist with patients dc planning needs.  Expected Discharge Plan: Skilled Nursing Facility Barriers to Discharge: Continued Medical Work up  Expected Discharge Plan and Services In-house Referral: Clinical Social Work Discharge Planning Services: CM Consult Post Acute Care Choice: Skilled Nursing Facility, Resumption of Svcs/PTA Provider Living arrangements for the past 2 months: Skilled Nursing Facility Psychiatric nurse)                                       Social Determinants of Health (SDOH) Interventions SDOH Screenings   Food Insecurity: No Food Insecurity (09/28/2023)  Housing: Low Risk  (09/28/2023)  Transportation Needs: No Transportation Needs (09/28/2023)  Utilities: Not At Risk (09/28/2023)  Financial Resource Strain: Low Risk  (02/08/2021)   Received from [facility], [facility]  Physical Activity: Inactive (02/08/2021)   Received from [facility], [facility]  Social Connections: Moderately Isolated (09/28/2023)  Stress: No Stress Concern Present (02/08/2021)   [facility]  Tobacco Use: Low Risk  (09/27/2023)    Readmission Risk Interventions    08/30/2023   12:30 PM  Readmission Risk Prevention Plan  Post Dischage Appt Complete  Medication Screening Complete  Transportation Screening Complete

## 2023-09-28 NOTE — TOC Initial Note (Addendum)
 Transition of Care [facility]) - Initial/Assessment Note    Patient Details  Name: Brian Ruiz MRN: [id number] Date of Birth: Dec 21, 1945  [facility]) CM/SW Contact:    Cosimo Diones, RN Phone Number: 09/28/2023, 12:49 PM  Clinical Narrative: Patient presented for GI Bleed. PTA patient was from [facility]- will need PT/OT consult for the patient to return. Patient and spouse agreeable for CSW to fax out so patient can return. Spouse at the bedside during the visit and she hopes the patient can get his bed back at [facility]. Patient has DME rolling walker in the home. Patient's PCP is Dr. Asenso and he does home visits. Spouse gets medications from [facility] on [address]. CSW to continue to follow for transition of care needs as the patient progresses.                  Expected Discharge Plan: Skilled Nursing Facility Barriers to Discharge: Continued Medical Work up  Expected Discharge Plan and Services In-house Referral: Clinical Social Work Discharge Planning Services: CM Consult Post Acute Care Choice: Skilled Nursing Facility, Resumption of Svcs/PTA Provider Living arrangements for the past 2 months: Skilled Nursing Facility Psychiatric nurse)  Prior Living Arrangements/Services Living arrangements for the past 2 months: Skilled Nursing Facility Psychiatric nurse) Lives with:: Facility Resident Patient language and need for interpreter reviewed:: Yes Do you feel safe going back to the place where you live?: Yes      Need for Family Participation in Patient Care: Yes (Comment) Care giver support system in place?: Yes (comment) Current home services: DME ([facility], [facility]) Criminal Activity/Legal Involvement Pertinent to Current Situation/Hospitalization: No - Comment as needed  Activities of Daily Living   ADL Screening (condition at time of admission) Does the patient have a NEW difficulty with getting in/out of bed, walking, or climbing stairs that is expected to  last >3 days?: No Does the patient have a NEW difficulty with communication that is expected to last >3 days?: No Does the patient have difficulty seeing, even when wearing glasses/contacts?: No (Simultaneous filing. User may not have seen previous data.)  Permission Sought/Granted Permission sought to share information with : Family Supports, Case Manager Permission granted to share information with : Yes, Verbal Permission Granted   Emotional Assessment Appearance:: Appears stated age Attitude/Demeanor/Rapport: Engaged Affect (typically observed): Appropriate Orientation: : Oriented to Self, Oriented to Place, Oriented to  Time, Oriented to Situation Alcohol / Substance Use: Not Applicable Psych Involvement: No (comment)  Admission diagnosis:  Rectal bleeding [K62.5] Patient Active Problem List   Diagnosis Date Noted   Gastroesophageal reflux disease 09/27/2023   Rectal bleeding 09/27/2023   NSTEMI (non-ST elevated myocardial infarction) (HCC) 08/31/2023   Acute metabolic encephalopathy 08/30/2023   Acute respiratory failure with hypoxia (HCC) 08/28/2023   COVID-19 virus infection 08/28/2023   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   Depression with anxiety 09/12/2021   Palliative care encounter 08/03/2021   Physical debility 08/03/2021   Severe protein-calorie malnutrition (HCC) 08/03/2021   Gastrointestinal hemorrhage with melena 06/13/2019   Acute on chronic combined systolic and diastolic CHF (congestive heart failure) (HCC) 05/16/2018   Chronic fatigue 06/14/2017   Palpitations 06/14/2017   Aortic ectasia (HCC) 11/15/2016   Age-related osteoporosis without current pathological fracture 10/27/2016   Screening for AAA (abdominal aortic aneurysm) 10/27/2016   Screening for prostate cancer 10/27/2016   Need for hepatitis C screening test 10/27/2016   Encounter for general adult medical examination without abnormal findings 11/26/2013  Screening for  osteoporosis 11/26/2013   Kyphoscoliosis deformity of spine 07/04/2013   Enlarged prostate with lower urinary tract symptoms (LUTS) 04/12/2013   Screen for colon cancer 04/12/2013   Undiagnosed cardiac murmurs 04/12/2013   Backache 04/12/2013   White coat syndrome with hypertension 04/12/2013   Lumbar spinal stenosis 10/29/2012   DDD (degenerative disc disease), lumbosacral 03/16/2012   Lumbosacral radiculopathy 03/16/2012   Generalized anxiety disorder 03/14/2012   Renal pain 12/16/2011   Sacroiliitis, not elsewhere classified (HCC) 08/11/2011   Sacroiliac joint pain 07/27/2011   Bilateral hip pain 07/07/2011   Chronic pain syndrome 07/07/2011   Spondylolisthesis of lumbosacral region 07/07/2011   Hereditary and idiopathic peripheral neuropathy 05/27/2011   Accidental drug overdose 05/15/2011   Guilty feelings 05/15/2011   Essential hypertension 05/15/2011   Hypotension 05/15/2011   Bradycardia 05/15/2011   Chronic back pain 05/15/2011   Vitamin D deficiency 03/08/2011   PCP:  Financial risk analyst, [facility]:   [facility] [address] [place] [address] Phone: [phone] Fax: [phone]  [facility] [address] [place] [address] Phone: [phone] Fax: [phone]  Social Drivers of Health (SDOH) Social History: SDOH Screenings   Food Insecurity: No Food Insecurity (09/28/2023)  Housing: Low Risk  (09/28/2023)  Transportation Needs: No Transportation Needs (09/28/2023)  Utilities: Not At Risk (09/28/2023)  Financial Resource Strain: Low Risk  (02/08/2021)   Received from [facility], [facility]  Physical Activity: Inactive (02/08/2021)   Received from [facility], [facility]  Social Connections: Moderately Isolated (09/28/2023)  Stress: No Stress Concern Present (02/08/2021)   [facility]  Tobacco Use: Low Risk  (09/27/2023)   Readmission Risk  Interventions    08/30/2023   12:30 PM  Readmission Risk Prevention Plan  Post Dischage Appt Complete  Medication Screening Complete  Transportation Screening Complete

## 2023-09-28 NOTE — Progress Notes (Addendum)
 Daily Progress Note  DOA: 09/27/2023 Hospital Day: 2   Chief Complaint: Lower GI bleeding  ASSESSMENT    78 y.o. year old male with hypertension, DM2,  PUD, dementia , chronic back pain on opioids, pancreatitis (2022), chronic systolic heart failure, atrial fibrillation   Hemodynamically stable GI bleed ( presumably lower) [place] anemia  Presenting hemoglobin 11, down from baseline of 13.6 though it was 12.6 on the 11th of this month while hospitalized for heart failure / NSTEMI and COVID.  Today : Further decline in hemoglobin from 11 to 10.2 since admission but hgb stable overnight.  He is currently having lower GI bleeding on exam   Atrial fibrillation. S/p cardioversion 09/04/23. Chronic Eliquis    Recent admission for acute systolic heart failure /  cardiogenic shock possibly related to Takotsubo cardiomyopathy versus a large LAD territorial infarct   Dementia  PLAN   --Home Eliquis  is on hold --Monitor H/H. Will change daily CBC to H/H Q 8 hours --I spoke with wife Willetta Harpin on the phone. Explained that patient has continued to have lower GI bleeding. She is agreeable to colonoscopy as is Mr. Wampler. Schedule for a colonoscopy to be done tomorrow. The risks and benefits of colonoscopy with possible polypectomy / biopsies were discussed with the wife to agrees to sign consent.  --CT angio in interim if brisk bleeding ( I do not think he bleeding briskly enough at the moment).   Subjective   No complaints.  No abdominal pain.  He does not know if he has had a bowel movement today or if there has been any recurrent bleeding   Objective    Recent Labs    09/27/23 1007 09/27/23 1450 09/27/23 1849 09/27/23 2102 09/28/23 0435  WBC 8.8  --   --   --  9.1  HGB 11.0*   < > 10.4* 10.2* 10.2*  HCT 32.9*   < > 30.7* 29.7* 29.7*  MCV 92.4  --   --   --  91.7  PLT 335  --   --   --  337   < > = values in this interval not displayed.   No results for input(s): "FOLATE",  "VITAMINB12", "FERRITIN", "TIBC", "IRONPCTSAT" in the last 72 hours. Recent Labs    09/27/23 1007 09/28/23 0435  NA 126* 130*  K 3.1* 3.2*  CL 88* 91*  CO2 28 26  GLUCOSE 87 77  BUN 15 14  CREATININE 1.56* 1.42*  CALCIUM 8.8* 8.9   Recent Labs    09/27/23 1007 09/28/23 0435  PROT 6.2* 5.7*  ALBUMIN  3.3* 3.0*  AST 24 17  ALT 21 16  ALKPHOS 64 56  BILITOT 0.8 1.0   Recent Labs    09/27/23 1007  INR 1.5*   No results for input(s): "AFPTUMOR" in the last 72 hours.   No results for input(s): "ANA" in the last 72 hours.  Imaging:  DG Chest 2 View CLINICAL DATA:  Fever.  Hemoptysis.  COVID-19.  EXAM: CHEST - 2 VIEW  COMPARISON:  Chest radiograph dated 05/15/2011.  FINDINGS: Mild cardiomegaly with mild central vascular congestion. Elevation of the right hemidiaphragm. There are bibasilar atelectasis. Pneumonia is not excluded. No large pleural effusion. No pneumothorax. Atherosclerotic calcification of the aorta. No acute osseous pathology.  IMPRESSION: 1. Mild cardiomegaly with mild central vascular congestion. 2. Bibasilar atelectasis. Pneumonia is not excluded.  Electronically Signed   By: Angus Bark M.D.   On: 09/06/2023 14:01  Scheduled inpatient medications:   amiodarone   200 mg Oral Daily   feeding supplement  1 Container Oral TID BM   potassium chloride   40 mEq Oral BID   sertraline   75 mg Oral Daily   sodium chloride  flush  3-10 mL Intravenous Q12H   spironolactone   25 mg Oral Daily   tamsulosin   0.4 mg Oral QPC breakfast   torsemide   20 mg Oral Daily   Continuous inpatient infusions:  PRN inpatient medications: acetaminophen  **OR** acetaminophen , HYDROmorphone , ondansetron  **OR** ondansetron  (ZOFRAN ) IV, sodium chloride  flush  Vital signs in last 24 hours: Temp:  [97.9 F (36.6 C)-98.7 F (37.1 C)] 97.9 F (36.6 C) (05/01 0751) Pulse Rate:  [64-96] 71 (05/01 0751) Resp:  [11-20] 20 (05/01 0751) BP: (97-139)/(63-98) 131/76  (05/01 0751) SpO2:  [98 %-100 %] 99 % (05/01 0751) Weight:  [80.3 kg] 80.3 kg (04/30 1708) Last BM Date : 09/27/23  Intake/Output Summary (Last 24 hours) at 09/28/2023 1310 Last data filed at 09/28/2023 0534 Gross per 24 hour  Intake 0 ml  Output 350 ml  Net -350 ml    Intake/Output from previous day: 04/30 0701 - 05/01 0700 In: 0  Out: 350 [Urine:350] Intake/Output this shift: No intake/output data recorded.   Physical Exam:  General: Alert male in NAD Heart:  Regular rate.  Pulmonary: Normal respiratory effort Abdomen: Soft, nondistended, nontender. Normal bowel sounds. Rectal: Sitting in approximately 50 cc of red blood.  Perianally there is red blood present Extremities: No lower extremity edema  Neurologic: Alert and oriented.  Answers questions appropriately but not necessarily accurately Psych: Pleasant. Cooperative     LOS: 1 day   Mai Schwalbe ,NP 09/28/2023, 1:10 PM

## 2023-09-28 NOTE — H&P (View-Only) (Signed)
 Daily Progress Note  DOA: 09/27/2023 Hospital Day: 2   Chief Complaint: Lower GI bleeding  ASSESSMENT    78 y.o. year old male with hypertension, DM2,  PUD, dementia , chronic back pain on opioids, pancreatitis (2022), chronic systolic heart failure, atrial fibrillation   Hemodynamically stable GI bleed ( presumably lower) [place] anemia  Presenting hemoglobin 11, down from baseline of 13.6 though it was 12.6 on the 11th of this month while hospitalized for heart failure / NSTEMI and COVID.  Today : Further decline in hemoglobin from 11 to 10.2 since admission but hgb stable overnight.  He is currently having lower GI bleeding on exam   Atrial fibrillation. S/p cardioversion 09/04/23. Chronic Eliquis    Recent admission for acute systolic heart failure /  cardiogenic shock possibly related to Takotsubo cardiomyopathy versus a large LAD territorial infarct   Dementia  PLAN   --Home Eliquis  is on hold --Monitor H/H. Will change daily CBC to H/H Q 8 hours --I spoke with wife Willetta Harpin on the phone. Explained that patient has continued to have lower GI bleeding. She is agreeable to colonoscopy as is Mr. Wampler. Schedule for a colonoscopy to be done tomorrow. The risks and benefits of colonoscopy with possible polypectomy / biopsies were discussed with the wife to agrees to sign consent.  --CT angio in interim if brisk bleeding ( I do not think he bleeding briskly enough at the moment).   Subjective   No complaints.  No abdominal pain.  He does not know if he has had a bowel movement today or if there has been any recurrent bleeding   Objective    Recent Labs    09/27/23 1007 09/27/23 1450 09/27/23 1849 09/27/23 2102 09/28/23 0435  WBC 8.8  --   --   --  9.1  HGB 11.0*   < > 10.4* 10.2* 10.2*  HCT 32.9*   < > 30.7* 29.7* 29.7*  MCV 92.4  --   --   --  91.7  PLT 335  --   --   --  337   < > = values in this interval not displayed.   No results for input(s): "FOLATE",  "VITAMINB12", "FERRITIN", "TIBC", "IRONPCTSAT" in the last 72 hours. Recent Labs    09/27/23 1007 09/28/23 0435  NA 126* 130*  K 3.1* 3.2*  CL 88* 91*  CO2 28 26  GLUCOSE 87 77  BUN 15 14  CREATININE 1.56* 1.42*  CALCIUM 8.8* 8.9   Recent Labs    09/27/23 1007 09/28/23 0435  PROT 6.2* 5.7*  ALBUMIN  3.3* 3.0*  AST 24 17  ALT 21 16  ALKPHOS 64 56  BILITOT 0.8 1.0   Recent Labs    09/27/23 1007  INR 1.5*   No results for input(s): "AFPTUMOR" in the last 72 hours.   No results for input(s): "ANA" in the last 72 hours.  Imaging:  DG Chest 2 View CLINICAL DATA:  Fever.  Hemoptysis.  COVID-19.  EXAM: CHEST - 2 VIEW  COMPARISON:  Chest radiograph dated 05/15/2011.  FINDINGS: Mild cardiomegaly with mild central vascular congestion. Elevation of the right hemidiaphragm. There are bibasilar atelectasis. Pneumonia is not excluded. No large pleural effusion. No pneumothorax. Atherosclerotic calcification of the aorta. No acute osseous pathology.  IMPRESSION: 1. Mild cardiomegaly with mild central vascular congestion. 2. Bibasilar atelectasis. Pneumonia is not excluded.  Electronically Signed   By: Angus Bark M.D.   On: 09/06/2023 14:01  Scheduled inpatient medications:   amiodarone   200 mg Oral Daily   feeding supplement  1 Container Oral TID BM   potassium chloride   40 mEq Oral BID   sertraline   75 mg Oral Daily   sodium chloride  flush  3-10 mL Intravenous Q12H   spironolactone   25 mg Oral Daily   tamsulosin   0.4 mg Oral QPC breakfast   torsemide   20 mg Oral Daily   Continuous inpatient infusions:  PRN inpatient medications: acetaminophen  **OR** acetaminophen , HYDROmorphone , ondansetron  **OR** ondansetron  (ZOFRAN ) IV, sodium chloride  flush  Vital signs in last 24 hours: Temp:  [97.9 F (36.6 C)-98.7 F (37.1 C)] 97.9 F (36.6 C) (05/01 0751) Pulse Rate:  [64-96] 71 (05/01 0751) Resp:  [11-20] 20 (05/01 0751) BP: (97-139)/(63-98) 131/76  (05/01 0751) SpO2:  [98 %-100 %] 99 % (05/01 0751) Weight:  [80.3 kg] 80.3 kg (04/30 1708) Last BM Date : 09/27/23  Intake/Output Summary (Last 24 hours) at 09/28/2023 1310 Last data filed at 09/28/2023 0534 Gross per 24 hour  Intake 0 ml  Output 350 ml  Net -350 ml    Intake/Output from previous day: 04/30 0701 - 05/01 0700 In: 0  Out: 350 [Urine:350] Intake/Output this shift: No intake/output data recorded.   Physical Exam:  General: Alert male in NAD Heart:  Regular rate.  Pulmonary: Normal respiratory effort Abdomen: Soft, nondistended, nontender. Normal bowel sounds. Rectal: Sitting in approximately 50 cc of red blood.  Perianally there is red blood present Extremities: No lower extremity edema  Neurologic: Alert and oriented.  Answers questions appropriately but not necessarily accurately Psych: Pleasant. Cooperative     LOS: 1 day   Mai Schwalbe ,NP 09/28/2023, 1:10 PM

## 2023-09-28 NOTE — Progress Notes (Signed)
   09/28/23 1458  Spiritual Encounters  Type of Visit Initial  Care provided to: Patient  Reason for visit Routine spiritual support   Chaplain responding to Spiritual Consult with stated reason for consult as "Trauma".  Chaplain reviewed chart prior to Pt visit and could find no reason for or evidence of "trauma" but decided to let the Pt bring it out. Upon arrival to the room and stating who I was, Pt declined to speak with me.  Pt was not rude, just did not feel that talking with a chaplain was necessary.  No further spiritual interventions necessary at this time.  Chaplain services remain available by Spiritual Consult or for emergent cases, paging [phone]  Chaplain Dewitte Foreman, MDiv [email or web address] [phone]

## 2023-09-28 NOTE — Progress Notes (Signed)
 PROGRESS NOTE    Brian Ruiz  ZOX:[id number] DOB: April 04, 1946 DOA: 09/27/2023 PCP: [facility]  77/M with systolic CHF, A-fib on Eliquis  memory, cognitive deficits, depression, anxiety, chronic pain, opioid dependence was recently hospitalized with hypoxic respiratory failure from CHF and COVID, complicated by cardiogenic shock, low output failure treated with milrinone  subsequently discharged to SNF with palliative care on 4/11 back in the ED 4/30 with rectal bleeding at SNF.  -VSS, labs noted mild downtrend in hemoglobin from 12.6 at discharge to 10.2 range now  Subjective: -Feels okay, no events overnight, denies dyspnea, denies further bleeding episodes so far  Assessment and Plan:  Rectal bleeding,?  Hematochezia - Unreliable historian, mild downtrend in hemoglobin - Gastroenterology consulting, Eliquis  on hold, recommended CTA abdomen if he has recurrence - Monitor hemoglobin  Chronic systolic CHF -Last echo with EF 35-40%, severe LVH, concern for Takotsubo cardiomyopathy - Recent hospitalization with low output failure requiring milrinone  - Clinically appears relatively euvolemic, restart torsemide  and Aldactone   Hypokalemia Replace  CKD 3a - Stable, monitor  Paroxysmal A-fib - Rate controlled, in sinus rhythm, continue amiodarone , Eliquis  held as above  Chronic pain, narcotic dependence - On oral Dilaudid  at baseline  Dementia, cognitive deficits   DVT prophylaxis: SCDs Code Status: DNR Family Communication: None present Disposition Plan: SNF likely 48 hours  Consultants: GI   Objective: Vitals:   09/27/23 2016 09/27/23 2343 09/28/23 0520 09/28/23 0751  BP: 120/75 97/63 124/69 131/76  Pulse: 73 64 70 71  Resp: 20 16 18 20   Temp: 98.1 F (36.7 C) 98.3 F (36.8 C) 98.4 F (36.9 C) 97.9 F (36.6 C)  TempSrc: Oral Oral Oral Oral  SpO2: 100% 98% 100% 99%  Weight:      Height:        Intake/Output Summary (Last 24 hours) at 09/28/2023  1002 Last data filed at 09/28/2023 0534 Gross per 24 hour  Intake 0 ml  Output 350 ml  Net -350 ml   Filed Weights   09/27/23 1708  Weight: 80.3 kg    Examination:  General exam: Appears calm and comfortable, AAO x 2, mild cognitive deficits HEENT: No JVD Respiratory system: Clear to auscultation Cardiovascular system: S1 & S2 heard, RRR.  Abd: nondistended, soft and nontender.Normal bowel sounds heard. Extremities: Trace edema Skin: No rashes Psychiatry:  Mood & affect appropriate.     Data Reviewed:   CBC: Recent Labs  Lab 09/27/23 1007 09/27/23 1450 09/27/23 1849 09/27/23 2102 09/28/23 0435  WBC 8.8  --   --   --  9.1  NEUTROABS 6.7  --   --   --   --   HGB 11.0* 9.9* 10.4* 10.2* 10.2*  HCT 32.9* 30.0* 30.7* 29.7* 29.7*  MCV 92.4  --   --   --  91.7  PLT 335  --   --   --  337   Basic Metabolic Panel: Recent Labs  Lab 09/27/23 1007 09/28/23 0435  NA 126* 130*  K 3.1* 3.2*  CL 88* 91*  CO2 28 26  GLUCOSE 87 77  BUN 15 14  CREATININE 1.56* 1.42*  CALCIUM 8.8* 8.9   GFR: Estimated Creatinine Clearance: 46.4 mL/min (A) (by C-G formula based on SCr of 1.42 mg/dL (H)). Liver Function Tests: Recent Labs  Lab 09/27/23 1007 09/28/23 0435  AST 24 17  ALT 21 16  ALKPHOS 64 56  BILITOT 0.8 1.0  PROT 6.2* 5.7*  ALBUMIN  3.3* 3.0*   No results for input(s): "LIPASE", "  AMYLASE" in the last 168 hours. No results for input(s): "AMMONIA" in the last 168 hours. Coagulation Profile: Recent Labs  Lab 09/27/23 1007  INR 1.5*   Cardiac Enzymes: No results for input(s): "CKTOTAL", "CKMB", "CKMBINDEX", "TROPONINI" in the last 168 hours. BNP (last 3 results) No results for input(s): "PROBNP" in the last 8760 hours. HbA1C: No results for input(s): "HGBA1C" in the last 72 hours. CBG: No results for input(s): "GLUCAP" in the last 168 hours. Lipid Profile: No results for input(s): "CHOL", "HDL", "LDLCALC", "TRIG", "CHOLHDL", "LDLDIRECT" in the last 72  hours. Thyroid Function Tests: No results for input(s): "TSH", "T4TOTAL", "FREET4", "T3FREE", "THYROIDAB" in the last 72 hours. Anemia Panel: No results for input(s): "VITAMINB12", "FOLATE", "FERRITIN", "TIBC", "IRON", "RETICCTPCT" in the last 72 hours. Urine analysis:    Component Value Date/Time   COLORURINE YELLOW 11/11/2020 0041   APPEARANCEUR CLEAR 11/11/2020 0041   LABSPEC 1.018 11/11/2020 0041   PHURINE 6.0 11/11/2020 0041   GLUCOSEU NEGATIVE 11/11/2020 0041   HGBUR SMALL (A) 11/11/2020 0041   BILIRUBINUR NEGATIVE 11/11/2020 0041   KETONESUR NEGATIVE 11/11/2020 0041   PROTEINUR NEGATIVE 11/11/2020 0041   UROBILINOGEN 0.2 05/15/2011 1151   NITRITE NEGATIVE 11/11/2020 0041   LEUKOCYTESUR NEGATIVE 11/11/2020 0041   Sepsis Labs: @LABRCNTIP (procalcitonin:4,lacticidven:4)  )No results found for this or any previous visit (from the past 240 hours).   Radiology Studies: No results found.   Scheduled Meds:  amiodarone   200 mg Oral Daily   sertraline   75 mg Oral Daily   sodium chloride  flush  3-10 mL Intravenous Q12H   tamsulosin   0.4 mg Oral QPC breakfast   Continuous Infusions:   LOS: 1 day    Time spent:    Deforest Fast, MD [facility]   09/28/2023, 10:02 AM

## 2023-09-29 ENCOUNTER — Encounter (HOSPITAL_COMMUNITY)
Admission: EM | Disposition: A | Discharge status: Skilled Nursing Facility | Payer: Self-pay | Source: Home / Self Care | Attending: Internal Medicine

## 2023-09-29 ENCOUNTER — Encounter (HOSPITAL_COMMUNITY): Payer: Self-pay | Admitting: Internal Medicine

## 2023-09-29 ENCOUNTER — Inpatient Hospital Stay (HOSPITAL_COMMUNITY): Payer: Self-pay | Admitting: Certified Registered"

## 2023-09-29 DIAGNOSIS — K625 Hemorrhage of anus and rectum: Secondary | ICD-10-CM

## 2023-09-29 LAB — POCT I-STAT, CHEM 8
BUN: 24 mg/dL — ABNORMAL HIGH (ref 8–23)
Calcium, Ion: 1.13 mmol/L — ABNORMAL LOW (ref 1.15–1.40)
Chloride: 99 mmol/L (ref 98–111)
Creatinine, Ser: 1.5 mg/dL — ABNORMAL HIGH (ref 0.61–1.24)
Glucose, Bld: 112 mg/dL — ABNORMAL HIGH (ref 70–99)
HCT: 30 % — ABNORMAL LOW (ref 39.0–52.0)
Hemoglobin: 10.2 g/dL — ABNORMAL LOW (ref 13.0–17.0)
Potassium: 3.1 mmol/L — ABNORMAL LOW (ref 3.5–5.1)
Sodium: 138 mmol/L (ref 135–145)
TCO2: 27 mmol/L (ref 22–32)

## 2023-09-29 LAB — BASIC METABOLIC PANEL WITH GFR
Anion gap: 17 — ABNORMAL HIGH (ref 5–15)
Anion gap: 15 (ref 5–15)
Anion gap: 17 — ABNORMAL HIGH (ref 5–15)
BUN: 17 mg/dL (ref 8–23)
BUN: 18 mg/dL (ref 8–23)
BUN: 19 mg/dL (ref 8–23)
CO2: 24 mmol/L (ref 22–32)
CO2: 26 mmol/L (ref 22–32)
CO2: 25 mmol/L (ref 22–32)
Calcium: 8.7 mg/dL — ABNORMAL LOW (ref 8.9–10.3)
Calcium: 9 mg/dL (ref 8.9–10.3)
Calcium: 8.9 mg/dL (ref 8.9–10.3)
Chloride: 98 mmol/L (ref 98–111)
Chloride: 98 mmol/L (ref 98–111)
Chloride: 99 mmol/L (ref 98–111)
Creatinine, Ser: 1.83 mg/dL — ABNORMAL HIGH (ref 0.61–1.24)
Creatinine, Ser: 1.77 mg/dL — ABNORMAL HIGH (ref 0.61–1.24)
Creatinine, Ser: 1.75 mg/dL — ABNORMAL HIGH (ref 0.61–1.24)
GFR, Estimated: 38 mL/min — ABNORMAL LOW (ref >60)
GFR, Estimated: 39 mL/min — ABNORMAL LOW (ref >60)
GFR, Estimated: 40 mL/min — ABNORMAL LOW (ref >60)
Glucose, Bld: 128 mg/dL — ABNORMAL HIGH (ref 70–99)
Glucose, Bld: 119 mg/dL — ABNORMAL HIGH (ref 70–99)
Glucose, Bld: 109 mg/dL — ABNORMAL HIGH (ref 70–99)
Potassium: 2.5 mmol/L — CL (ref 3.5–5.1)
Potassium: 2.9 mmol/L — ABNORMAL LOW (ref 3.5–5.1)
Potassium: 3.2 mmol/L — ABNORMAL LOW (ref 3.5–5.1)
Sodium: 139 mmol/L (ref 135–145)
Sodium: 139 mmol/L (ref 135–145)
Sodium: 141 mmol/L (ref 135–145)

## 2023-09-29 LAB — HEMOGLOBIN AND HEMATOCRIT, BLOOD
HCT: 29.9 % — ABNORMAL LOW (ref 39.0–52.0)
HCT: 30.7 % — ABNORMAL LOW (ref 39.0–52.0)
Hemoglobin: 10.3 g/dL — ABNORMAL LOW (ref 13.0–17.0)
Hemoglobin: 10.2 g/dL — ABNORMAL LOW (ref 13.0–17.0)

## 2023-09-29 LAB — MAGNESIUM: Magnesium: 2.3 mg/dL (ref 1.7–2.4)

## 2023-09-29 SURGERY — COLONOSCOPY
Anesthesia: Monitor Anesthesia Care

## 2023-09-29 MED ORDER — SODIUM CHLORIDE 0.9 % IV SOLN: INTRAVENOUS | Status: DC | PRN

## 2023-09-29 MED ORDER — SODIUM CHLORIDE 0.9 % IV SOLN
INTRAVENOUS | Status: AC
  Administered 2023-09-29: 20 mL/h via INTRAVENOUS

## 2023-09-29 MED ORDER — POTASSIUM CHLORIDE CRYS ER 20 MEQ PO TBCR
40.0000 meq | EXTENDED_RELEASE_TABLET | Freq: Once | ORAL | Status: AC
Start: 2023-09-29 — End: 2023-09-29
  Administered 2023-09-29: 40 meq via ORAL
  Filled 2023-09-29: qty 2

## 2023-09-29 MED ORDER — LIDOCAINE 2% (20 MG/ML) 5 ML SYRINGE
INTRAMUSCULAR | Status: DC | PRN
  Administered 2023-09-29: 40 mg via INTRAVENOUS

## 2023-09-29 MED ORDER — PROPOFOL 500 MG/50ML IV EMUL
INTRAVENOUS | Status: DC | PRN
  Administered 2023-09-29: 75 ug/kg/min via INTRAVENOUS

## 2023-09-29 MED ORDER — POTASSIUM CHLORIDE 10 MEQ/100ML IV SOLN
10.0000 meq | INTRAVENOUS | Status: AC
  Administered 2023-09-29 (×4): 10 meq via INTRAVENOUS
  Filled 2023-09-29 (×4): qty 100

## 2023-09-29 MED ORDER — PROPOFOL 10 MG/ML IV BOLUS
INTRAVENOUS | Status: DC | PRN
  Administered 2023-09-29: 30 mg via INTRAVENOUS

## 2023-09-29 MED ORDER — NA SULFATE-K SULFATE-MG SULF 17.5-3.13-1.6 GM/177ML PO SOLN
1.0000 | Freq: Once | ORAL | Status: AC
  Administered 2023-09-29: 354 mL
  Filled 2023-09-29: qty 1

## 2023-09-29 MED ORDER — PHENYLEPHRINE 80 MCG/ML (10ML) SYRINGE FOR IV PUSH (FOR BLOOD PRESSURE SUPPORT)
PREFILLED_SYRINGE | INTRAVENOUS | Status: DC | PRN
  Administered 2023-09-29 (×3): 160 ug via INTRAVENOUS

## 2023-09-29 NOTE — Progress Notes (Signed)
 PROGRESS NOTE    GEREMY DRESSEL  JYN:[id number] DOB: 1945-12-12 DOA: 09/27/2023 PCP: [facility]  77/M with systolic CHF, A-fib on Eliquis  memory, cognitive deficits, depression, anxiety, chronic pain, opioid dependence was recently hospitalized with hypoxic respiratory failure from CHF and COVID, complicated by cardiogenic shock, low output failure treated with milrinone  subsequently discharged to SNF with palliative care on 4/11 back in the ED 4/30 with rectal bleeding at SNF.  -VSS, labs noted mild downtrend in hemoglobin from 12.6 at discharge to 10.2 range now  Subjective: He is alert, no new complaints. Feels weak, had multiples BM   Assessment and Plan: Rectal bleeding,?  Hematochezia - Unreliable historian, mild downtrend in hemoglobin - Gastroenterology consulting, Eliquis  on hold, recommended CTA abdomen if he has recurrence - Monitor hemoglobin -plan for colonoscopy today  Chronic systolic CHF -Last echo with EF 35-40%, severe LVH, concern for Takotsubo cardiomyopathy - Recent hospitalization with low output failure requiring milrinone  - Clinically appears relatively euvolemic, hold  torsemide  and Aldactone  today, he is NPO, increased cr to 1.8   Hypokalemia Replace IV and orally.   CKD 3a - Previous creatinine 1.3--- 1.8 (4 weeks ago) - Creatinine increased to 1.8--- from 1.6 Plan to hold torsemide  and spironolactone  today due to n.p.o. status  Paroxysmal A-fib - Rate controlled, in sinus rhythm, continue amiodarone , Eliquis  held as above  Chronic pain, narcotic dependence - On oral Dilaudid  at baseline  Dementia, cognitive deficits Delirium precaution  Hyponatremia; resolving in the setting of heart failure   DVT prophylaxis: SCDs Code Status: DNR Family Communication:  Disposition Plan: SNF likely 48 hours  Consultants: GI   Objective: Vitals:   09/28/23 1608 09/28/23 2021 09/28/23 2338 09/29/23 0457  BP: 111/86 129/82 (!) 143/74 125/73   Pulse: 70 86 79 77  Resp: 15 16 20 16   Temp: 98.4 F (36.9 C) 98.6 F (37 C) 98.3 F (36.8 C) 98.3 F (36.8 C)  TempSrc: Oral Oral Oral Oral  SpO2: 99% 90% 94% 93%  Weight:      Height:        Intake/Output Summary (Last 24 hours) at 09/29/2023 0756 Last data filed at 09/29/2023 0519 Gross per 24 hour  Intake 240 ml  Output 1500 ml  Net -1260 ml   Filed Weights   09/27/23 1708  Weight: 80.3 kg    Examination:  General exam: NAD Respiratory system: BL air movement,. Crackles bases.  Cardiovascular system: S 1, S 2 RRR Abd: BS present, soft, nt Extremities:    Data Reviewed:   CBC: Recent Labs  Lab 09/27/23 1007 09/27/23 1450 09/27/23 2102 09/28/23 0435 09/28/23 1304 09/28/23 1733 09/29/23 0109  WBC 8.8  --   --  9.1 8.2  --   --   NEUTROABS 6.7  --   --   --   --   --   --   HGB 11.0*   < > 10.2* 10.2* 10.4* 9.9* 10.3*  HCT 32.9*   < > 29.7* 29.7* 31.2* 30.1* 29.9*  MCV 92.4  --   --  91.7 92.6  --   --   PLT 335  --   --  337 340  --   --    < > = values in this interval not displayed.   Basic Metabolic Panel: Recent Labs  Lab 09/27/23 1007 09/28/23 0435  NA 126* 130*  K 3.1* 3.2*  CL 88* 91*  CO2 28 26  GLUCOSE 87 77  BUN 15 14  CREATININE 1.56* 1.42*  CALCIUM 8.8* 8.9   GFR: Estimated Creatinine Clearance: 46.4 mL/min (A) (by C-G formula based on SCr of 1.42 mg/dL (H)). Liver Function Tests: Recent Labs  Lab 09/27/23 1007 09/28/23 0435  AST 24 17  ALT 21 16  ALKPHOS 64 56  BILITOT 0.8 1.0  PROT 6.2* 5.7*  ALBUMIN  3.3* 3.0*   No results for input(s): "LIPASE", "AMYLASE" in the last 168 hours. No results for input(s): "AMMONIA" in the last 168 hours. Coagulation Profile: Recent Labs  Lab 09/27/23 1007  INR 1.5*   Cardiac Enzymes: No results for input(s): "CKTOTAL", "CKMB", "CKMBINDEX", "TROPONINI" in the last 168 hours. BNP (last 3 results) No results for input(s): "PROBNP" in the last 8760 hours. HbA1C: No results for  input(s): "HGBA1C" in the last 72 hours. CBG: No results for input(s): "GLUCAP" in the last 168 hours. Lipid Profile: No results for input(s): "CHOL", "HDL", "LDLCALC", "TRIG", "CHOLHDL", "LDLDIRECT" in the last 72 hours. Thyroid Function Tests: No results for input(s): "TSH", "T4TOTAL", "FREET4", "T3FREE", "THYROIDAB" in the last 72 hours. Anemia Panel: No results for input(s): "VITAMINB12", "FOLATE", "FERRITIN", "TIBC", "IRON", "RETICCTPCT" in the last 72 hours. Urine analysis:    Component Value Date/Time   COLORURINE YELLOW 11/11/2020 0041   APPEARANCEUR CLEAR 11/11/2020 0041   LABSPEC 1.018 11/11/2020 0041   PHURINE 6.0 11/11/2020 0041   GLUCOSEU NEGATIVE 11/11/2020 0041   HGBUR SMALL (A) 11/11/2020 0041   BILIRUBINUR NEGATIVE 11/11/2020 0041   KETONESUR NEGATIVE 11/11/2020 0041   PROTEINUR NEGATIVE 11/11/2020 0041   UROBILINOGEN 0.2 05/15/2011 1151   NITRITE NEGATIVE 11/11/2020 0041   LEUKOCYTESUR NEGATIVE 11/11/2020 0041   Sepsis Labs: @LABRCNTIP (procalcitonin:4,lacticidven:4)  )No results found for this or any previous visit (from the past 240 hours).   Radiology Studies: No results found.   Scheduled Meds:  amiodarone   200 mg Oral Daily   feeding supplement  1 Container Oral TID BM   sertraline   75 mg Oral Daily   sodium chloride  flush  3-10 mL Intravenous Q12H   spironolactone   25 mg Oral Daily   tamsulosin   0.4 mg Oral QPC breakfast   torsemide   20 mg Oral Daily   Continuous Infusions:   LOS: 2 days    Time spent:    Catharine Clock , MD [facility]   09/29/2023, 7:56 AM

## 2023-09-29 NOTE — Evaluation (Signed)
 Physical Therapy Evaluation Patient Details Name: Brian Ruiz MRN: [id number] DOB: [birth year] Today's Date: 09/29/2023  History of Present Illness  Pt is a 78 y.o. male admitted 4/30 from [facility] with rectal bleeding. He was at [facility] for rehab following 3/31-4/11 hospitalization for covid. PMH: HTN, depression/anxiety, chronic pain syndrome with opiod dependence, scoliosis   Clinical Impression  Pt admitted with above diagnosis. PTA pt was at [facility] for rehab (following 3/31-4/11 hospitalization). Prior to that hospital stay, pt lived at home with wife mod I household amb with RW.  Pt currently with functional limitations due to the deficits listed below (see PT Problem List). On eval, he required max assist bed mobility. Pt very limited by pain and fatigue. He was NPO at time of eval awaiting colonoscopy. Pt will benefit from acute skilled PT to increase their independence and safety with mobility to allow discharge. Upon d/c, plan is for return to [facility] for further therapy.         If plan is discharge home, recommend the following: Assist for transportation;Two people to help with walking and/or transfers;Two people to help with bathing/dressing/bathroom;Help with stairs or ramp for entrance;Supervision due to cognitive status   Can travel by private vehicle   No    Equipment Recommendations None recommended by PT  Recommendations for Other Services       Functional Status Assessment Patient has had a recent decline in their functional status and demonstrates the ability to make significant improvements in function in a reasonable and predictable amount of time.     Precautions / Restrictions Precautions Precautions: Fall Recall of Precautions/Restrictions: Impaired      Mobility  Bed Mobility Overal bed mobility: Needs Assistance Bed Mobility: Supine to Sit, Sit to Supine     Supine to sit: HOB elevated, Used rails, Max assist Sit to supine: Max  assist, Used rails, HOB elevated   General bed mobility comments: attempted transition to EOB. After trunk elevated from bed, pt stating "no, no, I can't do this" and pt assisted back to supine in bed.    Transfers                   General transfer comment: unable due to pain and weakness    Ambulation/Gait                  Stairs            Wheelchair Mobility     Tilt Bed    Modified Rankin (Stroke Patients Only)       Balance                                             Pertinent Vitals/Pain Pain Assessment Pain Assessment: Faces Faces Pain Scale: Hurts little more Pain Location: generalized Pain Descriptors / Indicators: Aching, Discomfort, Sore Pain Intervention(s): Monitored during session, Limited activity within patient's tolerance    Home Living Family/patient expects to be discharged to:: Skilled nursing facility Living Arrangements: Other (Comment);Spouse/significant other (Short-term SNF, then home with wife) Available Help at Discharge: Family;Available 24 hours/day Type of Home: House Home Access: Level entry       Home Layout: One level Home Equipment: Agricultural consultant (2 wheels);Cane - single point      Prior Function Prior Level of Function : Independent/Modified Independent  Mobility Comments: mod I household amb with RW prior to 3/31 hospitalization       Extremity/Trunk Assessment   Upper Extremity Assessment Upper Extremity Assessment: Generalized weakness    Lower Extremity Assessment Lower Extremity Assessment: Generalized weakness    Cervical / Trunk Assessment Cervical / Trunk Assessment: Kyphotic  Communication   Communication Communication: No apparent difficulties    Cognition Arousal: Alert Behavior During Therapy: Flat affect   PT - Cognitive impairments: Problem solving, Safety/Judgement, Sequencing, Attention, Initiation, Awareness, Memory                        PT - Cognition Comments: Pt unable to recall what he has been working on with therapy at [facility]. Following commands: Impaired Following commands impaired: Follows one step commands with increased time     Cueing Cueing Techniques: Verbal cues, Gestural cues, Tactile cues     General Comments General comments (skin integrity, edema, etc.): VSS on 0.5L O2. Pt NPO at time of PT eval, scheduled for colonoscopy this PM.    Exercises General Exercises - Lower Extremity Ankle Circles/Pumps: AROM, Both, 10 reps, Supine   Assessment/Plan    PT Assessment Patient needs continued PT services  PT Problem List Decreased strength;Decreased range of motion;Decreased activity tolerance;Decreased balance;Decreased mobility;Decreased cognition;Decreased knowledge of use of DME;Decreased safety awareness;Cardiopulmonary status limiting activity;Pain       PT Treatment Interventions DME instruction;Gait training;Functional mobility training;Therapeutic activities;Therapeutic exercise;Balance training;Cognitive remediation;Patient/family education    PT Goals (Current goals can be found in the Care Plan section)  Acute Rehab PT Goals Patient Stated Goal: "I want to feel better" PT Goal Formulation: With patient Time For Goal Achievement: 10/13/23 Potential to Achieve Goals: Good    Frequency Min 2X/week     Co-evaluation               AM-PAC PT "6 Clicks" Mobility  Outcome Measure Help needed turning from your back to your side while in a flat bed without using bedrails?: A Lot Help needed moving from lying on your back to sitting on the side of a flat bed without using bedrails?: A Lot Help needed moving to and from a bed to a chair (including a wheelchair)?: Total Help needed standing up from a chair using your arms (e.g., wheelchair or bedside chair)?: Total Help needed to walk in hospital room?: Total Help needed climbing 3-5 steps with a railing? : Total 6  Click Score: 8    End of Session Equipment Utilized During Treatment: Oxygen Activity Tolerance: Patient limited by pain;Patient limited by fatigue Patient left: in bed;with call bell/phone within reach;with bed alarm set Nurse Communication: Mobility status PT Visit Diagnosis: Unsteadiness on feet (R26.81);Muscle weakness (generalized) (M62.81)    Time: 1610-9604 PT Time Calculation (min) (ACUTE ONLY): 12 min   Charges:   PT Evaluation $PT Eval Moderate Complexity: 1 Mod   PT General Charges $$ ACUTE PT VISIT: 1 Visit         Dorothye Gathers., PT  Office # [phone]   Guadelupe Leech 09/29/2023, 10:24 AM

## 2023-09-29 NOTE — Progress Notes (Signed)
 OT Cancellation Note  Patient Details Name: AUDLEY ROSEL MRN: [id number] DOB: 01-May-1946   Cancelled Treatment:    Reason Eval/Treat Not Completed: Fatigue/lethargy limiting ability to participate (Pt with very limited ability to participate in PT eval, impending colonoscopy with pt NPO. Will follow.)  Jonette Nestle 09/29/2023, 10:32 AM Avanell Leigh, OTR/L Acute Rehabilitation Services Office: [phone]

## 2023-09-29 NOTE — Transfer of Care (Signed)
 Immediate Anesthesia Transfer of Care Note  Patient: Brian Ruiz  Procedure(s) Performed: COLONOSCOPY  Patient Location: PACU and Endoscopy Unit  Anesthesia Type:MAC  Level of Consciousness: awake  Airway & Oxygen Therapy: Patient Spontanous Breathing and Patient connected to nasal cannula oxygen  Post-op Assessment: Report given to RN and Post -op Vital signs reviewed and stable  Post vital signs: Reviewed and stable  Last Vitals:  Vitals Value Taken Time  BP    Temp    Pulse 76 09/29/23 1537  Resp 25 09/29/23 1537  SpO2 93 % 09/29/23 1537  Vitals shown include unfiled device data.  Last Pain:  Vitals:   09/29/23 1427  TempSrc: Temporal  PainSc: 0-No pain         Complications: No notable events documented.

## 2023-09-29 NOTE — TOC Progression Note (Signed)
 Transition of Care [facility]) - Progression Note    Patient Details  Name: Brian Ruiz MRN: [id number] Date of Birth: [birth year]  [facility] of [facility] CM/SW Contact  Carmon Christen, LCSWA Phone Number: 09/29/2023, 11:07 AM  Clinical Narrative:     Patient has SNF bed at [facility] when medically stable. CSW will continue to follow and assist with patients dc planning needs.  Expected Discharge Plan: Skilled Nursing Facility Barriers to Discharge: Continued Medical Work up  Expected Discharge Plan and Services In-house Referral: Clinical Social Work Discharge Planning Services: CM Consult Post Acute Care Choice: Skilled Nursing Facility, Resumption of Svcs/PTA Provider Living arrangements for the past 2 months: Skilled Nursing Facility Psychiatric nurse)                                       Social Determinants of Health (SDOH) Interventions SDOH Screenings   Food Insecurity: No Food Insecurity (09/28/2023)  Housing: Low Risk  (09/28/2023)  Transportation Needs: No Transportation Needs (09/28/2023)  Utilities: Not At Risk (09/28/2023)  Financial Resource Strain: Low Risk  (02/08/2021)   Received from [facility]  Physical Activity: Inactive (02/08/2021)   Received from [facility], [facility]  Social Connections: Moderately Isolated (09/28/2023)  Stress: No Stress Concern Present (02/08/2021)   [facility]  Tobacco Use: Low Risk  (09/27/2023)    Readmission Risk Interventions    08/30/2023   12:30 PM  Readmission Risk Prevention Plan  Post Dischage Appt Complete  Medication Screening Complete  Transportation Screening Complete

## 2023-09-29 NOTE — Anesthesia Postprocedure Evaluation (Signed)
 Anesthesia Post Note  Patient: Brian Ruiz  Procedure(s) Performed: COLONOSCOPY     Patient location during evaluation: PACU Anesthesia Type: MAC Level of consciousness: awake and alert Pain management: pain level controlled Vital Signs Assessment: post-procedure vital signs reviewed and stable Respiratory status: spontaneous breathing, nonlabored ventilation and respiratory function stable Cardiovascular status: blood pressure returned to baseline Postop Assessment: no apparent nausea or vomiting Anesthetic complications: no   No notable events documented.  Last Vitals:  Vitals:   09/29/23 1600 09/29/23 1617  BP: 127/71 124/72  Pulse: 73 72  Resp: (!) 22 20  Temp:  36.8 C  SpO2: 94% 96%    Last Pain:  Vitals:   09/29/23 1617  TempSrc: Oral  PainSc:                  Rayfield Cairo

## 2023-09-29 NOTE — Op Note (Signed)
 [facility] Patient Name: Brian Ruiz Procedure Date : 09/29/2023 MRN: [id number] Attending MD: Nannette Babe , MD, [id number] Date of Birth: [birth year] CSN: [id number] Age: 78 Admit Type: Inpatient Procedure:                Colonoscopy Indications:              Rectal bleeding Providers:                Amber Bail. Bridgett Camps, MD, Ila Malay, RN, Nohemi Batters,                            Technician Referring MD:             [facility] Medicines:                Monitored Anesthesia Care Complications:            No immediate complications. Estimated Blood Loss:     Estimated blood loss: none. Procedure:                Pre-Anesthesia Assessment:                           - Prior to the procedure, a History and Physical                            was performed, and patient medications and                            allergies were reviewed. The patient's tolerance of                            previous anesthesia was also reviewed. The risks                            and benefits of the procedure and the sedation                            options and risks were discussed with the patient.                            All questions were answered, and informed consent                            was obtained. Prior Anticoagulants: The patient has                            taken Eliquis  (apixaban ), last dose was 2 days                            prior to procedure. ASA Grade Assessment: III - A                            patient with severe systemic disease. After  reviewing the risks and benefits, the patient was                            deemed in satisfactory condition to undergo the                            procedure.                           After obtaining informed consent, the colonoscope                            was passed under direct vision. Throughout the                            procedure, the patient's blood pressure, pulse,  and                            oxygen saturations were monitored continuously. The                            CF-HQ190L ([id number] Olympus coloscope was                            introduced through the anus with the intention of                            advancing to the cecum. The scope was advanced to                            the rectum before the procedure was aborted.                            Medications were given. The colonoscopy was                            performed without difficulty. The patient tolerated                            the procedure well. The quality of the bowel                            preparation was poor. Scope In: 3:23:10 PM Scope Out: 3:26:37 PM Total Procedure Duration: 0 hours 3 minutes 27 seconds  Findings:      The digital rectal exam was normal.      A large amount of stool was found in the rectum, precluding       visualization. Impression:               - Preparation of the colon was poor.                           - Stool in the rectum.                           -  No specimens collected. Moderate Sedation:      N/A Recommendation:           - Return patient to hospital ward for ongoing care.                           - Clear liquid diet.                           - Reattempt prep tonight for colonoscopy tomorrow,                            perhaps via NG tube. Procedure Code(s):        --- Professional ---                           [phone], 53, Colonoscopy, flexible; diagnostic,                            including collection of specimen(s) by brushing or                            washing, when performed (separate procedure) Diagnosis Code(s):        --- Professional ---                           K62.5, Hemorrhage of anus and rectum CPT copyright 2022 American Medical Association. All rights reserved. The codes documented in this report are preliminary and upon coder review may  be revised to meet current compliance requirements. Nannette Babe, MD 09/29/2023 3:28:16 PM This report has been signed electronically. Number of Addenda: 0

## 2023-09-29 NOTE — Anesthesia Preprocedure Evaluation (Signed)
 Anesthesia Evaluation  Patient identified by MRN, date of birth, ID band Patient awake    Reviewed: Allergy & Precautions, NPO status , Patient's Chart, lab work & pertinent test results  Airway Mallampati: II  TM Distance: >3 FB Neck ROM: Full    Dental no notable dental hx.    Pulmonary neg pulmonary ROS   Pulmonary exam normal        Cardiovascular hypertension, +CHF  Normal cardiovascular exam     Neuro/Psych  PSYCHIATRIC DISORDERS Anxiety Depression     Neuromuscular disease    GI/Hepatic Bowel prep,GERD  Medicated and Controlled,,(+)     substance abuse    Endo/Other  negative endocrine ROS    Renal/GU Renal InsufficiencyRenal disease     Musculoskeletal  (+) Arthritis ,  narcotic dependentBack pain, chronic   Abdominal   Peds  Hematology  (+) Blood dyscrasia (Eliquis ), anemia INR: 1.5   Anesthesia Other Findings Lower GI bleeding  Reproductive/Obstetrics                             Anesthesia Physical Anesthesia Plan  ASA: 3  Anesthesia Plan: MAC   Post-op Pain Management:    Induction:   PONV Risk Score and Plan: 1 and Propofol  infusion and Treatment may vary due to age or medical condition  Airway Management Planned: Nasal Cannula  Additional Equipment:   Intra-op Plan:   Post-operative Plan:   Informed Consent: I have reviewed the patients History and Physical, chart, labs and discussed the procedure including the risks, benefits and alternatives for the proposed anesthesia with the patient or authorized representative who has indicated his/her understanding and acceptance.   Patient has DNR.  Discussed DNR with patient and Suspend DNR.   Dental advisory given  Plan Discussed with: CRNA  Anesthesia Plan Comments:        Anesthesia Quick Evaluation

## 2023-09-29 NOTE — Anesthesia Preprocedure Evaluation (Signed)
 Anesthesia Evaluation  Patient identified by MRN, date of birth, ID band Patient awake    Reviewed: Allergy & Precautions, NPO status , Patient's Chart, lab work & pertinent test results  History of Anesthesia Complications Negative for: history of anesthetic complications  Airway Mallampati: II  TM Distance: >3 FB Neck ROM: Full    Dental  (+) Dental Advisory Given   Pulmonary neg pulmonary ROS   Pulmonary exam normal        Cardiovascular hypertension, + Past MI and +CHF  Normal cardiovascular exam+ Valvular Problems/Murmurs AI    '25 TEE - EF 30%. The left ventricle demonstrates regional wall motion abnormalities with the function of basilar LV segments relatively preserved and mid to apical segments more hypokinetic. Right ventricular systolic function is mildly reduced. Left atrial size was moderately dilated. Mild mitral valve regurgitation. Aortic valve regurgitation is moderate.     Neuro/Psych  PSYCHIATRIC DISORDERS Anxiety Depression     Neuromuscular disease    GI/Hepatic Neg liver ROS,GERD  Medicated and Controlled,,  Endo/Other  negative endocrine ROS   K 3.1   Renal/GU Renal InsufficiencyRenal disease     Musculoskeletal  (+) Arthritis ,    Abdominal   Peds  Hematology  (+) Blood dyscrasia, anemia  On eliquis     Anesthesia Other Findings Chronic pain   Reproductive/Obstetrics                             Anesthesia Physical Anesthesia Plan  ASA: 4  Anesthesia Plan: MAC   Post-op Pain Management: Minimal or no pain anticipated   Induction:   PONV Risk Score and Plan: 1 and Propofol  infusion and Treatment may vary due to age or medical condition  Airway Management Planned: Natural Airway and Simple Face Mask  Additional Equipment: None  Intra-op Plan:   Post-operative Plan:   Informed Consent: I have reviewed the patients History and Physical, chart, labs and  discussed the procedure including the risks, benefits and alternatives for the proposed anesthesia with the patient or authorized representative who has indicated his/her understanding and acceptance.   Patient has DNR.  Discussed DNR with patient and Suspend DNR.     Plan Discussed with: CRNA and Anesthesiologist  Anesthesia Plan Comments:         Anesthesia Quick Evaluation

## 2023-09-29 NOTE — Interval H&P Note (Signed)
 History and Physical Interval Note: Colonoscopy to evaluate rectal bleeding The nature of the procedure, as well as the risks, benefits, and alternatives were carefully and thoroughly reviewed with the patient and his wife. Ample time for discussion and questions allowed. The patient and his wife understood, was satisfied, and agreed to proceed.      Latest Ref Rng & Units 09/29/2023    2:42 PM 09/29/2023   10:07 AM 09/29/2023    1:09 AM  CBC  Hemoglobin 13.0 - 17.0 g/dL 25.3  66.4  40.3   Hematocrit 39.0 - 52.0 % 30.0  30.7  29.9      09/29/2023 3:03 PM  Tucker Gails  has presented today for surgery, with the diagnosis of Lower GI bleeding.  The various methods of treatment have been discussed with the patient and family. After consideration of risks, benefits and other options for treatment, the patient has consented to  Procedure(s): COLONOSCOPY (N/A) as a surgical intervention.  The patient's history has been reviewed, patient examined, no change in status, stable for surgery.  I have reviewed the patient's chart and labs.  Questions were answered to the patient's satisfaction.     Amber Bail Hanson Medeiros

## 2023-09-30 ENCOUNTER — Encounter (HOSPITAL_COMMUNITY): Payer: Self-pay | Admitting: Internal Medicine

## 2023-09-30 ENCOUNTER — Inpatient Hospital Stay (HOSPITAL_COMMUNITY): Payer: Self-pay | Admitting: Anesthesiology

## 2023-09-30 ENCOUNTER — Encounter (HOSPITAL_COMMUNITY)
Admission: EM | Disposition: A | Discharge status: Skilled Nursing Facility | Payer: Self-pay | Source: Home / Self Care | Attending: Internal Medicine

## 2023-09-30 DIAGNOSIS — K5730 Diverticulosis of large intestine without perforation or abscess without bleeding: Secondary | ICD-10-CM | POA: Diagnosis not present

## 2023-09-30 DIAGNOSIS — K633 Ulcer of intestine: Secondary | ICD-10-CM | POA: Diagnosis not present

## 2023-09-30 DIAGNOSIS — K625 Hemorrhage of anus and rectum: Secondary | ICD-10-CM | POA: Diagnosis not present

## 2023-09-30 DIAGNOSIS — K6289 Other specified diseases of anus and rectum: Secondary | ICD-10-CM

## 2023-09-30 DIAGNOSIS — D125 Benign neoplasm of sigmoid colon: Secondary | ICD-10-CM

## 2023-09-30 DIAGNOSIS — D62 Acute posthemorrhagic anemia: Secondary | ICD-10-CM | POA: Diagnosis not present

## 2023-09-30 DIAGNOSIS — D122 Benign neoplasm of ascending colon: Secondary | ICD-10-CM

## 2023-09-30 DIAGNOSIS — K648 Other hemorrhoids: Secondary | ICD-10-CM

## 2023-09-30 LAB — BASIC METABOLIC PANEL WITH GFR
Anion gap: 15 (ref 5–15)
BUN: 17 mg/dL (ref 8–23)
CO2: 27 mmol/L (ref 22–32)
Calcium: 9.1 mg/dL (ref 8.9–10.3)
Chloride: 104 mmol/L (ref 98–111)
Creatinine, Ser: 1.65 mg/dL — ABNORMAL HIGH (ref 0.61–1.24)
GFR, Estimated: 43 mL/min — ABNORMAL LOW (ref >60)
Glucose, Bld: 133 mg/dL — ABNORMAL HIGH (ref 70–99)
Potassium: 2.7 mmol/L — CL (ref 3.5–5.1)
Sodium: 146 mmol/L — ABNORMAL HIGH (ref 135–145)

## 2023-09-30 LAB — POCT I-STAT, CHEM 8
BUN: 22 mg/dL (ref 8–23)
Calcium, Ion: 1.07 mmol/L — ABNORMAL LOW (ref 1.15–1.40)
Chloride: 108 mmol/L (ref 98–111)
Creatinine, Ser: 1.4 mg/dL — ABNORMAL HIGH (ref 0.61–1.24)
Glucose, Bld: 110 mg/dL — ABNORMAL HIGH (ref 70–99)
HCT: 31 % — ABNORMAL LOW (ref 39.0–52.0)
Hemoglobin: 10.5 g/dL — ABNORMAL LOW (ref 13.0–17.0)
Potassium: 3.7 mmol/L (ref 3.5–5.1)
Sodium: 145 mmol/L (ref 135–145)
TCO2: 28 mmol/L (ref 22–32)

## 2023-09-30 LAB — CBC
HCT: 31.9 % — ABNORMAL LOW (ref 39.0–52.0)
Hemoglobin: 10.5 g/dL — ABNORMAL LOW (ref 13.0–17.0)
MCH: 30.6 pg (ref 26.0–34.0)
MCHC: 32.9 g/dL (ref 30.0–36.0)
MCV: 93 fL (ref 80.0–100.0)
Platelets: 371 10*3/uL (ref 150–400)
RBC: 3.43 MIL/uL — ABNORMAL LOW (ref 4.22–5.81)
RDW: 13.4 % (ref 11.5–15.5)
WBC: 9.5 10*3/uL (ref 4.0–10.5)
nRBC: 0 % (ref 0.0–0.2)

## 2023-09-30 SURGERY — COLONOSCOPY
Anesthesia: Monitor Anesthesia Care

## 2023-09-30 MED ORDER — POTASSIUM CHLORIDE 10 MEQ/100ML IV SOLN
INTRAVENOUS | Status: AC
  Administered 2023-09-30: 10 meq via INTRAVENOUS
  Filled 2023-09-30: qty 100

## 2023-09-30 MED ORDER — POTASSIUM CHLORIDE 10 MEQ/100ML IV SOLN: 10.0000 meq | Freq: Once | INTRAVENOUS | Status: AC

## 2023-09-30 MED ORDER — LIDOCAINE 2% (20 MG/ML) 5 ML SYRINGE
INTRAMUSCULAR | Status: DC | PRN
  Administered 2023-09-30: 20 mg via INTRAVENOUS

## 2023-09-30 MED ORDER — POLYETHYLENE GLYCOL 3350 17 G PO PACK
17.0000 g | PACK | Freq: Every day | ORAL | Status: DC
  Administered 2023-10-01 – 2023-10-03 (×3): 17 g via ORAL
  Filled 2023-09-30 (×3): qty 1

## 2023-09-30 MED ORDER — POTASSIUM CHLORIDE 10 MEQ/100ML IV SOLN
10.0000 meq | INTRAVENOUS | Status: AC
  Administered 2023-09-30 (×4): 10 meq via INTRAVENOUS
  Filled 2023-09-30 (×4): qty 100

## 2023-09-30 MED ORDER — PROPOFOL 10 MG/ML IV BOLUS
INTRAVENOUS | Status: DC | PRN
  Administered 2023-09-30 (×7): 20 mg via INTRAVENOUS

## 2023-09-30 NOTE — Transfer of Care (Signed)
 Immediate Anesthesia Transfer of Care Note  Patient: Brian Ruiz  Procedure(s) Performed: COLONOSCOPY POLYPECTOMY, INTESTINE BIOPSY, GI  Patient Location: PACU  Anesthesia Type:MAC  Level of Consciousness: sedated  Airway & Oxygen Therapy: Patient Spontanous Breathing  Post-op Assessment: Report given to RN  Post vital signs: Reviewed and stable  Last Vitals:  Vitals Value Taken Time  BP 96/66 09/30/23 1305  Temp    Pulse 85 09/30/23 1307  Resp 19 09/30/23 1307  SpO2 92 % 09/30/23 1307  Vitals shown include unfiled device data.  Last Pain:  Vitals:   09/30/23 1134  TempSrc: Tympanic  PainSc: 7          Complications: No notable events documented.

## 2023-09-30 NOTE — Op Note (Signed)
 [facility] Patient Name: Brian Ruiz Procedure Date : 09/30/2023 MRN: [id number] Attending MD: Nannette Babe , MD, [id number] Date of Birth: [birth year] CSN: [id number] Age: 78 Admit Type: Inpatient Procedure:                Colonoscopy Indications:              Hematochezia, Acute post hemorrhagic anemia Providers:                Amber Bail. Bridgett Camps, MD, Perla Bradford, RN, Adam Adam,                            Technician Referring MD:             [facility] Medicines:                Monitored Anesthesia Care Complications:            No immediate complications. Estimated Blood Loss:     Estimated blood loss was minimal. Procedure:                Pre-Anesthesia Assessment:                           - Prior to the procedure, a History and Physical                            was performed, and patient medications and                            allergies were reviewed. The patient's tolerance of                            previous anesthesia was also reviewed. The risks                            and benefits of the procedure and the sedation                            options and risks were discussed with the patient.                            All questions were answered, and informed consent                            was obtained. Prior Anticoagulants: The patient has                            taken Eliquis  (apixaban ), last dose was 4 days                            prior to procedure. ASA Grade Assessment: III - A                            patient with severe systemic disease. After  reviewing the risks and benefits, the patient was                            deemed in satisfactory condition to undergo the                            procedure.                           After obtaining informed consent, the colonoscope                            was passed under direct vision. Throughout the                            procedure,  the patient's blood pressure, pulse, and                            oxygen saturations were monitored continuously. The                            CF-HQ190L ([id number] Olympus colonoscope was                            introduced through the anus and advanced to the                            cecum, identified by appendiceal orifice and                            ileocecal valve. The colonoscopy was performed                            without difficulty. The patient tolerated the                            procedure well. The quality of the bowel                            preparation was good (after 2 day prep). The                            ileocecal valve, appendiceal orifice, and rectum                            were photographed. Scope In: 12:42:18 PM Scope Out: 12:59:47 PM Scope Withdrawal Time: 0 hours 13 minutes 21 seconds  Total Procedure Duration: 0 hours 17 minutes 29 seconds  Findings:      The digital rectal exam was normal.      Segmental inflammation characterized by erythema and ulcerations was       found in the proximal rectum and in the recto-sigmoid colon. Biopsies       were taken with a cold forceps for histology. Query ischemic or       stercoral related.  A 3 mm polyp was found in the ascending colon. The polyp was sessile.       The polyp was removed with a cold snare. Resection and retrieval were       complete.      A 5 mm polyp was found in the sigmoid colon. The polyp was sessile. The       polyp was removed with a cold snare. Resection and retrieval were       complete.      Multiple medium-mouthed and small-mouthed diverticula were found in the       sigmoid colon, hepatic flexure, ascending colon and cecum.      Internal hemorrhoids were found during retroflexion. The hemorrhoids       were small. Impression:               - Ulcerated mucosa was found in the proximal rectum                            and in the recto-sigmoid colon, rule out  ischemic                            colitis/stercoral changes (less likely IBD or                            infectious). Biopsied.                           - One 3 mm polyp in the ascending colon, removed                            with a cold snare. Resected and retrieved.                           - One 5 mm polyp in the sigmoid colon, removed with                            a cold snare. Resected and retrieved.                           - Moderate diverticulosis in the sigmoid colon, at                            the hepatic flexure, in the ascending colon and in                            the cecum.                           - Small internal hemorrhoids. Moderate Sedation:      N/A Recommendation:           - Return patient to hospital ward for ongoing care.                           - Advance diet as tolerated.                           -  Continue present medications.                           - Would hold DOAC another 5 days to allow further                            healing time for colonic ulcers.                           - Would recommend MiraLax  17 g once to twice daily                            to ensure no constipation.                           - Await pathology results. Procedure Code(s):        --- Professional ---                           [phone], Colonoscopy, flexible; with removal of                            tumor(s), polyp(s), or other lesion(s) by snare                            technique Diagnosis Code(s):        --- Professional ---                           K62.89, Other specified diseases of anus and rectum                           K52.9, Noninfective gastroenteritis and colitis,                            unspecified                           D12.2, Benign neoplasm of ascending colon                           D12.5, Benign neoplasm of sigmoid colon                           K64.8, Other hemorrhoids                           K92.1, Melena (includes  Hematochezia)                           D62, Acute posthemorrhagic anemia                           K57.30, Diverticulosis of large intestine without                            perforation or abscess without bleeding CPT copyright 2022  American Medical Association. All rights reserved. The codes documented in this report are preliminary and upon coder review may  be revised to meet current compliance requirements. Nannette Babe, MD 09/30/2023 1:28:00 PM This report has been signed electronically. Number of Addenda: 0

## 2023-09-30 NOTE — Progress Notes (Signed)
 PROGRESS NOTE    Brian Ruiz  ONG:[id number] DOB: 06/24/1945 DOA: 09/27/2023 PCP: [facility]  77/M with systolic CHF, A-fib on Eliquis  memory, cognitive deficits, depression, anxiety, chronic pain, opioid dependence was recently hospitalized with hypoxic respiratory failure from CHF and COVID, complicated by cardiogenic shock, low output failure treated with milrinone  subsequently discharged to SNF with palliative care on 4/11 back in the ED 4/30 with rectal bleeding at SNF.  -VSS, labs noted mild downtrend in hemoglobin from 12.6 at discharge to 10.2 range now  Subjective: He is alert, no new complaints. Feels weak, had multiples BM   Assessment and Plan: Rectal bleeding,?  Hematochezia - Unreliable historian, mild downtrend in hemoglobin - Gastroenterology consulting, Eliquis  on hold, recommended CTA abdomen if he has recurrence - Monitor hemoglobin - Underwent repeated colonoscopy 5/3, prior colonoscopy with poor prep.  Patient found to have ulcerated mucosa in the proximal rectum and in the rectosigmoid colon, rule out ischemic colitis, stercoral changes less likely IBD.  Biopsies were obtained.  GI recommended MiraLAX  daily.  Patient has been having issues with constipation. - hb  stable Per GI resume anticoagulation in 5 days  Chronic systolic CHF -Last echo with EF 35-40%, severe LVH, concern for Takotsubo cardiomyopathy - Recent hospitalization with low output failure requiring milrinone  - Clinically appears relatively euvolemic, hold  torsemide  and Aldactone  today, he is NPO, increased cr to 1.8 Continue to hold diuretics today, resume tomorrow.  Natremia: Improved   Hypokalemia Placed today again with IV  CKD 3a - Previous creatinine 1.3--- 1.8 (4 weeks ago) - Creatinine increased to 1.8--- from 1.6 Plan to hold torsemide  and spironolactone  today due to n.p.o. status Renal function stable today  Paroxysmal A-fib - Rate controlled, in sinus rhythm,  continue amiodarone , Eliquis  held as above  Chronic pain, narcotic dependence - On oral Dilaudid  at baseline  Dementia, cognitive deficits Delirium precaution  Hyponatremia; resolving in the setting of heart failure   DVT prophylaxis: SCDs Code Status: DNR Family Communication:  Disposition Plan: SNF likely in 24 hours  Consultants: GI  Subjective: He is alert feeling better, just came from colonoscopy.  Denies dyspnea or chest pain. Objective: Vitals:   09/29/23 1617 09/29/23 2002 09/29/23 2329 09/30/23 0641  BP: 124/72 130/73 (!) 151/74 (!) 167/79  Pulse: 72 79 75 79  Resp: 20 19 18 16   Temp: 98.2 F (36.8 C) 98.7 F (37.1 C) 98.7 F (37.1 C) 98.1 F (36.7 C)  TempSrc: Oral Oral Oral Oral  SpO2: 96% 94% 97% 99%  Weight:      Height:        Intake/Output Summary (Last 24 hours) at 09/30/2023 0708 Last data filed at 09/30/2023 4401 Gross per 24 hour  Intake 200 ml  Output 1500 ml  Net -1300 ml   Filed Weights   09/27/23 1708  Weight: 80.3 kg    Examination:  General exam: NAD Respiratory system: Bilateral air movement, Clear to auscultation Cardiovascular system: S1-S2 regular rhythm or rate Abd: BS present, soft, nt Extremities: no edema   Data Reviewed:   CBC: Recent Labs  Lab 09/27/23 1007 09/27/23 1450 09/28/23 0435 09/28/23 1304 09/28/23 1733 09/29/23 0109 09/29/23 1007 09/29/23 1442 09/30/23 0413  WBC 8.8  --  9.1 8.2  --   --   --   --  9.5  NEUTROABS 6.7  --   --   --   --   --   --   --   --   HGB  11.0*   < > 10.2* 10.4* 9.9* 10.3* 10.2* 10.2* 10.5*  HCT 32.9*   < > 29.7* 31.2* 30.1* 29.9* 30.7* 30.0* 31.9*  MCV 92.4  --  91.7 92.6  --   --   --   --  93.0  PLT 335  --  337 340  --   --   --   --  371   < > = values in this interval not displayed.   Basic Metabolic Panel: Recent Labs  Lab 09/28/23 0435 09/29/23 0613 09/29/23 1007 09/29/23 1054 09/29/23 1420 09/29/23 1442 09/30/23 0413  NA 130* 139  --  139 141 138 146*  K  3.2* 2.5*  --  2.9* 3.2* 3.1* 2.7*  CL 91* 98  --  98 99 99 104  CO2 26 24  --  26 25  --  27  GLUCOSE 77 128*  --  119* 109* 112* 133*  BUN 14 17  --  18 19 24* 17  CREATININE 1.42* 1.83*  --  1.77* 1.75* 1.50* 1.65*  CALCIUM 8.9 8.7*  --  9.0 8.9  --  9.1  MG  --   --  2.3  --   --   --   --    GFR: Estimated Creatinine Clearance: 39.9 mL/min (A) (by C-G formula based on SCr of 1.65 mg/dL (H)). Liver Function Tests: Recent Labs  Lab 09/27/23 1007 09/28/23 0435  AST 24 17  ALT 21 16  ALKPHOS 64 56  BILITOT 0.8 1.0  PROT 6.2* 5.7*  ALBUMIN  3.3* 3.0*   No results for input(s): "LIPASE", "AMYLASE" in the last 168 hours. No results for input(s): "AMMONIA" in the last 168 hours. Coagulation Profile: Recent Labs  Lab 09/27/23 1007  INR 1.5*   Cardiac Enzymes: No results for input(s): "CKTOTAL", "CKMB", "CKMBINDEX", "TROPONINI" in the last 168 hours. BNP (last 3 results) No results for input(s): "PROBNP" in the last 8760 hours. HbA1C: No results for input(s): "HGBA1C" in the last 72 hours. CBG: No results for input(s): "GLUCAP" in the last 168 hours. Lipid Profile: No results for input(s): "CHOL", "HDL", "LDLCALC", "TRIG", "CHOLHDL", "LDLDIRECT" in the last 72 hours. Thyroid Function Tests: No results for input(s): "TSH", "T4TOTAL", "FREET4", "T3FREE", "THYROIDAB" in the last 72 hours. Anemia Panel: No results for input(s): "VITAMINB12", "FOLATE", "FERRITIN", "TIBC", "IRON", "RETICCTPCT" in the last 72 hours. Urine analysis:    Component Value Date/Time   COLORURINE YELLOW 11/11/2020 0041   APPEARANCEUR CLEAR 11/11/2020 0041   LABSPEC 1.018 11/11/2020 0041   PHURINE 6.0 11/11/2020 0041   GLUCOSEU NEGATIVE 11/11/2020 0041   HGBUR SMALL (A) 11/11/2020 0041   BILIRUBINUR NEGATIVE 11/11/2020 0041   KETONESUR NEGATIVE 11/11/2020 0041   PROTEINUR NEGATIVE 11/11/2020 0041   UROBILINOGEN 0.2 05/15/2011 1151   NITRITE NEGATIVE 11/11/2020 0041   LEUKOCYTESUR NEGATIVE  11/11/2020 0041   Sepsis Labs: @LABRCNTIP (procalcitonin:4,lacticidven:4)  )No results found for this or any previous visit (from the past 240 hours).   Radiology Studies: No results found.   Scheduled Meds:  amiodarone   200 mg Oral Daily   feeding supplement  1 Container Oral TID BM   sertraline   75 mg Oral Daily   sodium chloride  flush  3-10 mL Intravenous Q12H   tamsulosin   0.4 mg Oral QPC breakfast   Continuous Infusions:  sodium chloride  20 mL/hr (09/29/23 1825)   potassium chloride  10 mEq (09/30/23 0607)     LOS: 3 days    Time spent:  Catharine Clock , MD [facility]   09/30/2023, 7:08 AM

## 2023-09-30 NOTE — Plan of Care (Signed)
  Problem: Education: Goal: Knowledge of General Education information will improve Description: Including pain rating scale, medication(s)/side effects and non-pharmacologic comfort measures Outcome: Progressing   Problem: Clinical Measurements: Goal: Ability to maintain clinical measurements within normal limits will improve Outcome: Progressing Goal: Will remain free from infection Outcome: Progressing Goal: Respiratory complications will improve Outcome: Progressing Goal: Cardiovascular complication will be avoided Outcome: Progressing   Problem: Coping: Goal: Level of anxiety will decrease Outcome: Progressing   Problem: Elimination: Goal: Will not experience complications related to urinary retention Outcome: Progressing   Problem: Pain Managment: Goal: General experience of comfort will improve and/or be controlled Outcome: Progressing   Problem: Safety: Goal: Ability to remain free from injury will improve Outcome: Progressing   Problem: Health Behavior/Discharge Planning: Goal: Ability to manage health-related needs will improve Outcome: Not Progressing   Problem: Clinical Measurements: Goal: Diagnostic test results will improve Outcome: Not Progressing   Problem: Activity: Goal: Risk for activity intolerance will decrease Outcome: Not Progressing   Problem: Nutrition: Goal: Adequate nutrition will be maintained Outcome: Not Progressing   Problem: Elimination: Goal: Will not experience complications related to bowel motility Outcome: Not Progressing   Problem: Skin Integrity: Goal: Risk for impaired skin integrity will decrease Outcome: Not Progressing

## 2023-09-30 NOTE — Interval H&P Note (Signed)
 History and Physical Interval Note: Attempt at repeat colonoscopy after additional bowel preparation overnight Per nursing and patient's stools are clear Colonoscopy to evaluate recent rectal bleeding  The nature of the procedure, as well as the risks, benefits, and alternatives were carefully and thoroughly reviewed with the patient. Ample time for discussion and questions allowed. The patient understood, was satisfied, and agreed to proceed.      Latest Ref Rng & Units 09/30/2023   11:54 AM 09/30/2023    4:13 AM 09/29/2023    2:42 PM  CBC  WBC 4.0 - 10.5 K/uL  9.5    Hemoglobin 13.0 - 17.0 g/dL 52.8  41.3  24.4   Hematocrit 39.0 - 52.0 % 31.0  31.9  30.0   Platelets 150 - 400 K/uL  371        09/30/2023 12:07 PM  Edyth Grana Kamau  has presented today for surgery, with the diagnosis of Rectal bleeding.  The various methods of treatment have been discussed with the patient and family. After consideration of risks, benefits and other options for treatment, the patient has consented to  Procedure(s): COLONOSCOPY (N/A) as a surgical intervention.  The patient's history has been reviewed, patient examined, no change in status, stable for surgery.  I have reviewed the patient's chart and labs.  Questions were answered to the patient's satisfaction.     Amber Bail Adelei Scobey

## 2023-09-30 NOTE — Anesthesia Postprocedure Evaluation (Signed)
 Anesthesia Post Note  Patient: OIVA BARROWMAN  Procedure(s) Performed: COLONOSCOPY POLYPECTOMY, INTESTINE BIOPSY, GI     Patient location during evaluation: PACU Anesthesia Type: MAC Level of consciousness: awake and alert Pain management: pain level controlled Vital Signs Assessment: post-procedure vital signs reviewed and stable Respiratory status: spontaneous breathing, nonlabored ventilation and respiratory function stable Cardiovascular status: stable and blood pressure returned to baseline Anesthetic complications: no  No notable events documented.  Last Vitals:  Vitals:   09/30/23 1330 09/30/23 1355  BP: 134/68 (!) 144/76  Pulse: 79 75  Resp: 20 19  Temp:  36.7 C  SpO2: 96% 100%    Last Pain:  Vitals:   09/30/23 1355  TempSrc: Oral  PainSc:                  Juventino Oppenheim

## 2023-09-30 NOTE — Evaluation (Signed)
 Occupational Therapy Evaluation Patient Details Name: Brian Ruiz MRN: [id number] DOB: 11-Aug-1945 Today's Date: 09/30/2023   History of Present Illness   Pt is a 78 y.o. male admitted 4/30 from [facility] with rectal bleeding. He was at [facility] SNF for rehab following 3/31-4/11 hospitalization for covid. PMH: HTN, depression/anxiety, chronic pain syndrome with opiod dependence, scoliosis     Clinical Impressions Pt presents with decline in function and safety with ADLs and ADL mobility with impaired strength, balance and endurance. PTA pt was at [facility] for ST rehab after recent hospitalization 07/3123-09/08/23. Prior to that pt was at home with his wife and reports that his wife assisted him with LB ADLs, he was Ind with UB ADLs, grooming and toileting, used RW for mobility. Pt currently requires max - total A with ADLs and  with max A to elevate trunk and LE mgt to attempt siting EOB. however pt began to adamantly decline full transition to sit EOB and assisted back to supine with total A to scoot to [facility] with bed controls to assist. OT will follow acutely to maximize level of function and safety with ADLs and ADL mobility and plans to return to SNF for ST rehab after acute care stay     If plan is discharge home, recommend the following:   A lot of help with bathing/dressing/bathroom;Assistance with cooking/housework;Direct supervision/assist for medications management;Assist for transportation;Help with stairs or ramp for entrance;Direct supervision/assist for financial management     Functional Status Assessment   Patient has had a recent decline in their functional status and demonstrates the ability to make significant improvements in function in a reasonable and predictable amount of time.     Equipment Recommendations   Other (comment) (defer)     Recommendations for Other Services         Precautions/Restrictions   Precautions Precautions: Fall Recall of  Precautions/Restrictions: Impaired Restrictions Weight Bearing Restrictions Per Provider Order: No     Mobility Bed Mobility Overal bed mobility: Needs Assistance Bed Mobility: Supine to Sit, Sit to Supine     Supine to sit: HOB elevated, Used rails, Max assist Sit to supine: Max assist, Used rails, HOB elevated   General bed mobility comments: attempted sitting EOB with max A to elevate trunk and LE mgt, however pt began to adamantly decline full transition to sit EOB and assisted back to supine with total A to scoot to [facility] with bed controls to assist.    Transfers                          Balance Overall balance assessment: Needs assistance Sitting-balance support: Feet supported Sitting balance-Leahy Scale: Poor                                     ADL either performed or assessed with clinical judgement   ADL Overall ADL's : Needs assistance/impaired Eating/Feeding: Supervision/ safety;Sitting;Bed level   Grooming: Supervision/safety;Bed level   Upper Body Bathing: Maximal assistance   Lower Body Bathing: Total assistance   Upper Body Dressing : Maximal assistance   Lower Body Dressing: Total assistance     Toilet Transfer Details (indicate cue type and reason): unable Toileting- Clothing Manipulation and Hygiene: Bed level;Total assistance         General ADL Comments: pt declined sitting EOB or SPTs to [facility] due to generalized pain and  fatigue     Vision Ability to See in Adequate Light: 0 Adequate Patient Visual Report: No change from baseline       Perception         Praxis         Pertinent Vitals/Pain Pain Assessment Pain Assessment: Faces Faces Pain Scale: Hurts little more Pain Location: generalized Pain Descriptors / Indicators: Aching, Discomfort Pain Intervention(s): Monitored during session, Limited activity within patient's tolerance, Repositioned     Extremity/Trunk Assessment Upper Extremity  Assessment Upper Extremity Assessment: Generalized weakness   Lower Extremity Assessment Lower Extremity Assessment: Defer to PT evaluation   Cervical / Trunk Assessment Cervical / Trunk Assessment: Kyphotic   Communication Communication Communication: No apparent difficulties Factors Affecting Communication: Hearing impaired   Cognition Arousal: Alert Behavior During Therapy: Flat affect Cognition: History of cognitive impairments       Memory impairment (select all impairments): Short-term memory   Executive functioning impairment (select all impairments): Problem solving, Reasoning, Sequencing OT - Cognition Comments: cues for safety and sequencing mobility. Needing repeated education at times                 Following commands: Impaired Following commands impaired: Follows one step commands with increased time     Cueing  General Comments   Cueing Techniques: Verbal cues;Gestural cues;Tactile cues      Exercises     Shoulder Instructions      Home Living Family/patient expects to be discharged to:: Skilled nursing facility Living Arrangements: Other (Comment);Spouse/significant other Available Help at Discharge: Family;Available 24 hours/day Type of Home: House Home Access: Level entry     Home Layout: One level     Bathroom Shower/Tub: Producer, television/film/video: Handicapped height     Home Equipment: Agricultural consultant (2 wheels);Cane - single point          Prior Functioning/Environment Prior Level of Function : Independent/Modified Independent             Mobility Comments: mod I household amb with RW prior to 3/31 hospitalization ADLs Comments: Pt reports that his wife assisted him with LB ADLs, he was Ind with UB ADLs, grooming and toileting    OT Problem List: Impaired balance (sitting and/or standing);Decreased range of motion;Decreased safety awareness;Cardiopulmonary status limiting activity;Decreased knowledge of  precautions;Decreased activity tolerance;Decreased knowledge of use of DME or AE   OT Treatment/Interventions: Self-care/ADL training;DME and/or AE instruction;Therapeutic activities;Balance training;Therapeutic exercise;Neuromuscular education;Energy conservation;Patient/family education      OT Goals(Current goals can be found in the care plan section)   Acute Rehab OT Goals Patient Stated Goal: none stated OT Goal Formulation: With patient Time For Goal Achievement: 10/14/23 Potential to Achieve Goals: Fair ADL Goals Pt Will Perform Grooming: with min assist;sitting Pt Will Perform Upper Body Bathing: with mod assist;sitting Pt Will Perform Upper Body Dressing: with mod assist;sitting Pt Will Transfer to Toilet: with max assist;with mod assist;stand pivot transfer;bedside commode Additional ADL Goal #1: Pt will compete bed mobility mod A to sit EOB in prep for functiona sitting tolerance tasks   OT Frequency:  Min 2X/week    Co-evaluation              AM-PAC OT "6 Clicks" Daily Activity     Outcome Measure Help from another person eating meals?: A Little Help from another person taking care of personal grooming?: A Little Help from another person toileting, which includes using toliet, bedpan, or urinal?: Total Help from another person bathing (including washing,  rinsing, drying)?: A Lot Help from another person to put on and taking off regular upper body clothing?: A Lot Help from another person to put on and taking off regular lower body clothing?: Total 6 Click Score: 12   End of Session    Activity Tolerance: Patient limited by fatigue;Patient limited by pain Patient left: in bed;with call bell/phone within reach  OT Visit Diagnosis: Other abnormalities of gait and mobility (R26.89);Muscle weakness (generalized) (M62.81);Pain Pain - part of body:  (generalized)                Time: 1006-1030 OT Time Calculation (min): 24 min Charges:  OT General Charges $OT  Visit: 1 Visit OT Evaluation $OT Eval Moderate Complexity: 1 Mod OT Treatments $Therapeutic Activity: 8-22 mins    Alfred Ann 09/30/2023, 1:24 PM

## 2023-10-01 ENCOUNTER — Inpatient Hospital Stay (HOSPITAL_COMMUNITY)

## 2023-10-01 DIAGNOSIS — K625 Hemorrhage of anus and rectum: Secondary | ICD-10-CM | POA: Diagnosis not present

## 2023-10-01 LAB — BASIC METABOLIC PANEL WITH GFR
Anion gap: 10 (ref 5–15)
BUN: 13 mg/dL (ref 8–23)
CO2: 27 mmol/L (ref 22–32)
Calcium: 9 mg/dL (ref 8.9–10.3)
Chloride: 103 mmol/L (ref 98–111)
Creatinine, Ser: 1.43 mg/dL — ABNORMAL HIGH (ref 0.61–1.24)
GFR, Estimated: 50 mL/min — ABNORMAL LOW (ref >60)
Glucose, Bld: 109 mg/dL — ABNORMAL HIGH (ref 70–99)
Potassium: 2.8 mmol/L — ABNORMAL LOW (ref 3.5–5.1)
Sodium: 140 mmol/L (ref 135–145)

## 2023-10-01 LAB — URINALYSIS, ROUTINE W REFLEX MICROSCOPIC
Bilirubin Urine: NEGATIVE
Glucose, UA: >=500 mg/dL — AB
Ketones, ur: NEGATIVE mg/dL
Nitrite: NEGATIVE
Protein, ur: 30 mg/dL — AB
RBC / HPF: >50 RBC/hpf (ref 0–5)
Specific Gravity, Urine: 1.009 (ref 1.005–1.030)
WBC, UA: >50 WBC/hpf (ref 0–5)
pH: 6 (ref 5.0–8.0)

## 2023-10-01 LAB — CBC
HCT: 31.1 % — ABNORMAL LOW (ref 39.0–52.0)
Hemoglobin: 10 g/dL — ABNORMAL LOW (ref 13.0–17.0)
MCH: 30.5 pg (ref 26.0–34.0)
MCHC: 32.2 g/dL (ref 30.0–36.0)
MCV: 94.8 fL (ref 80.0–100.0)
Platelets: 365 10*3/uL (ref 150–400)
RBC: 3.28 MIL/uL — ABNORMAL LOW (ref 4.22–5.81)
RDW: 13.5 % (ref 11.5–15.5)
WBC: 15 10*3/uL — ABNORMAL HIGH (ref 4.0–10.5)
nRBC: 0 % (ref 0.0–0.2)

## 2023-10-01 MED ORDER — SERTRALINE HCL 50 MG PO TABS
25.0000 mg | ORAL_TABLET | Freq: Three times a day (TID) | ORAL | Status: DC
  Administered 2023-10-02 – 2023-10-03 (×4): 25 mg via ORAL
  Filled 2023-10-01 (×4): qty 1

## 2023-10-01 MED ORDER — NYSTATIN 100000 UNIT/ML MT SUSP
5.0000 mL | Freq: Four times a day (QID) | OROMUCOSAL | Status: DC
  Administered 2023-10-01 – 2023-10-02 (×7): 500000 [IU] via ORAL
  Filled 2023-10-01 (×8): qty 5

## 2023-10-01 MED ORDER — POTASSIUM CHLORIDE 20 MEQ PO PACK: 40.0000 meq | PACK | Freq: Once | ORAL | Status: DC

## 2023-10-01 MED ORDER — SPIRONOLACTONE 25 MG PO TABS
25.0000 mg | ORAL_TABLET | Freq: Every day | ORAL | Status: DC
  Administered 2023-10-01 – 2023-10-03 (×3): 25 mg via ORAL
  Filled 2023-10-01 (×3): qty 1

## 2023-10-01 MED ORDER — POTASSIUM CHLORIDE 10 MEQ/100ML IV SOLN
10.0000 meq | INTRAVENOUS | Status: AC
  Administered 2023-10-01 (×4): 10 meq via INTRAVENOUS
  Filled 2023-10-01 (×4): qty 100

## 2023-10-01 MED ORDER — PANTOPRAZOLE SODIUM 40 MG IV SOLR
40.0000 mg | Freq: Two times a day (BID) | INTRAVENOUS | Status: DC
  Administered 2023-10-01 – 2023-10-02 (×3): 40 mg via INTRAVENOUS
  Filled 2023-10-01 (×3): qty 10

## 2023-10-01 MED ORDER — TORSEMIDE 20 MG PO TABS
20.0000 mg | ORAL_TABLET | Freq: Every day | ORAL | Status: DC
  Administered 2023-10-02 – 2023-10-03 (×2): 20 mg via ORAL
  Filled 2023-10-01 (×2): qty 1

## 2023-10-01 MED ORDER — POTASSIUM CHLORIDE CRYS ER 20 MEQ PO TBCR
40.0000 meq | EXTENDED_RELEASE_TABLET | Freq: Once | ORAL | Status: DC
  Filled 2023-10-01: qty 2

## 2023-10-01 NOTE — Evaluation (Signed)
 Clinical/Bedside Swallow Evaluation Patient Details  Name: Brian Ruiz MRN: [id number] Date of Birth: 1946-02-16  Today's Date: 10/01/2023 Time: SLP Start Time (ACUTE ONLY): 1333 SLP Stop Time (ACUTE ONLY): 1342 SLP Time Calculation (min) (ACUTE ONLY): 9 min  Past Medical History:  Past Medical History:  Diagnosis Date   Arthritis    Back pain, chronic    intolerant to narcotics, avoids NSAIDs due to vioxx related bleed,  sees pain clinic as [place], prior eval by [facility] neuro in [place]   Cholecystitis    Choledocholithiasis    Chronic diastolic CHF (congestive heart failure) (HCC) 05/16/2018   Per Dr Letta Raw note in 2019, pt did not have heart failure but hypertensive heart disease.  Azzie Bollman FNP-C   Encephalopathy acute 05/15/2011   Heart murmur    Hypertension    Inguinal hernia    Polyneuropathy    S/P thyroidectomy 1967   Scoliosis    Urinary retention    Past Surgical History:  Past Surgical History:  Procedure Laterality Date   BACK SURGERY  1975   [id number]   CARDIOVERSION N/A 09/04/2023   Procedure: CARDIOVERSION;  Surgeon: Darlis Eisenmenger, MD;  Location: [facility] INVASIVE CV LAB;  Service: Cardiovascular;  Laterality: N/A;   CHOLECYSTECTOMY N/A 11/13/2020   Procedure: LAPAROSCOPIC CHOLECYSTECTOMY WITH INTRAOPERATIVE CHOLANGIOGRAM;  Surgeon: Dorena Gander, MD;  Location: [facility] OR;  Service: General;  Laterality: N/A;   COLONOSCOPY     FOOT SURGERY  1985, 2000   due to arthritis   HAND SURGERY  2005   left - due to arthritis   INGUINAL HERNIA REPAIR  04/30/2012   Procedure: HERNIA REPAIR INGUINAL ADULT;  Surgeon: Darcella Earnest, MD;  Location: [facility];  Service: General;  Laterality: Left;  repair left inguinal hernia   RIGHT/LEFT HEART CATH AND CORONARY ANGIOGRAPHY N/A 09/01/2023   Procedure: RIGHT/LEFT HEART CATH AND CORONARY ANGIOGRAPHY;  Surgeon: Lauralee Poll, MD;  Location: [facility] INVASIVE CV LAB;  Service: Cardiovascular;  Laterality: N/A;    THYROIDECTOMY  1967   TONSILLECTOMY     TRANSESOPHAGEAL ECHOCARDIOGRAM (CATH LAB) N/A 09/04/2023   Procedure: TRANSESOPHAGEAL ECHOCARDIOGRAM;  Surgeon: Darlis Eisenmenger, MD;  Location: [facility] INVASIVE CV LAB;  Service: Cardiovascular;  Laterality: N/A;   HPI:  [name] is a 78 yo M who presented to the ED 4/30 with rectal bleeding at SNF. Colonoscopy completed and pt to adv as tolerated. CXR 5/4 results pending.  Pt with systolic CHF, A-fib on Eliquis  memory, cognitive deficits, depression, anxiety, chronic pain, opioid dependence was recently hospitalized with hypoxic respiratory failure from CHF and COVID, complicated by cardiogenic shock, low output failure treated with milrinone  subsequently discharged to SNF with palliative care on 4/11.    Assessment / Plan / Recommendation  Clinical Impression  Pt presents with functional swallowing as assessed clinically.  Pt tolerated all consistencies trialed, including serial straw sips of thin liquid, with no clinical s/s of aspiration and he exhibited good oral clearance of solids with trace-mild oral residue coating structures.  Pt c/o difficulty swallowing.  He denies coughing, feeling things go down the wrong way; endorses feeling of stasis.  There was throat clearing delayed to PO intake. Pt's symptoms suggest primary esophageal dysphagia.  Pt takes omeprozole for reflux at baseline and protonix  is now ordered.  If symptoms persist, consider further esophageal assessment.  Discussed diet preference.  Pt/family prefer to continue current diet choosing softer foods as needed.  Briefly reviewed strategies for esophageal dysphagia.  Recommend continuing regular texture diet with thin liquids.    SLP Visit Diagnosis: Dysphagia, unspecified (R13.10)    Aspiration Risk  No limitations    Diet Recommendation Regular;Thin liquid    Medication Administration:  (As tolerated.  Crush larger meds if needed and mix with puree) Compensations: Slow rate;Small  sips/bites Postural Changes: Seated upright at 90 degrees;Remain upright for at least 30 minutes after po intake    Other  Recommendations Recommended Consults: Consider esophageal assessment (if symptoms persist following treatment of GERD) Oral Care Recommendations: Oral care BID    Recommendations for follow up therapy are one component of a multi-disciplinary discharge planning process, led by the attending physician.  Recommendations may be updated based on patient status, additional functional criteria and insurance authorization.  Follow up Recommendations No SLP follow up      Assistance Recommended at Discharge  N/A  Functional Status Assessment Patient has not had a recent decline in their functional status  Frequency and Duration  (N/A)          Prognosis Prognosis for improved oropharyngeal function:  (N/A)      Swallow Study   General Date of Onset: 09/27/23 HPI: [name] is a 78 yo M who presented to the ED 4/30 with rectal bleeding at SNF. Colonoscopy completed and pt to adv as tolerated. CXR 5/4 results pending.  Pt with systolic CHF, A-fib on Eliquis  memory, cognitive deficits, depression, anxiety, chronic pain, opioid dependence was recently hospitalized with hypoxic respiratory failure from CHF and COVID, complicated by cardiogenic shock, low output failure treated with milrinone  subsequently discharged to SNF with palliative care on 4/11. Type of Study: Bedside Swallow Evaluation Previous Swallow Assessment: None Diet Prior to this Study: Regular;Thin liquids (Level 0) Temperature Spikes Noted: No Respiratory Status: Nasal cannula History of Recent Intubation: No Behavior/Cognition: Alert;Cooperative;Pleasant mood Oral Cavity Assessment: Within Functional Limits (c/o dry mouth) Oral Care Completed by SLP: No Oral Cavity - Dentition: Adequate natural dentition Patient Positioning: Upright in bed Baseline Vocal Quality: Normal Volitional Cough:  Strong Volitional Swallow: Able to elicit    Oral/Motor/Sensory Function Overall Oral Motor/Sensory Function: Mild impairment Facial ROM: Within Functional Limits Facial Symmetry: Within Functional Limits Lingual ROM: Within Functional Limits Lingual Symmetry: Within Functional Limits Lingual Strength: Reduced Velum: Within Functional Limits Mandible: Within Functional Limits   Ice Chips Ice chips: Not tested   Thin Liquid Thin Liquid: Within functional limits Presentation: Straw    Nectar Thick Nectar Thick Liquid: Not tested   Honey Thick Honey Thick Liquid: Not tested   Puree Puree: Within functional limits Presentation: Spoon   Solid     Solid: Within functional limits Presentation: Edger Goody, MA, CCC-SLP Acute Rehabilitation Services Office: [phone] 10/01/2023,2:04 PM

## 2023-10-01 NOTE — Plan of Care (Signed)
  Problem: Activity: Goal: Risk for activity intolerance will decrease Outcome: Progressing   Problem: Elimination: Goal: Will not experience complications related to bowel motility Outcome: Progressing   Problem: Coping: Goal: Level of anxiety will decrease Outcome: Progressing   Problem: Nutrition: Goal: Adequate nutrition will be maintained Outcome: Progressing

## 2023-10-01 NOTE — Progress Notes (Signed)
 PROGRESS NOTE    REDGE HARTT  WUJ:[id number] DOB: [birth year] DOA: 09/27/2023 PCP: [facility]  77/M with systolic CHF, A-fib on Eliquis  memory, cognitive deficits, depression, anxiety, chronic pain, opioid dependence was recently hospitalized with hypoxic respiratory failure from CHF and COVID, complicated by cardiogenic shock, low output failure treated with milrinone  subsequently discharged to SNF with palliative care on 4/11 back in the ED 4/30 with rectal bleeding at SNF.  -VSS, labs noted mild downtrend in hemoglobin from 12.6 at discharge to 10.2 range now  Subjective: Wife at bedside, report he is more confuse today.  Also patient report episode of Dysphagia, last night difficulty swallowing meat.  No cough.   Assessment and Plan: Rectal bleeding,?  Hematochezia - Unreliable historian, mild downtrend in hemoglobin - Gastroenterology consulting, Eliquis  on hold, recommended CTA abdomen if he has recurrence - Monitor hemoglobin - Underwent repeated colonoscopy 5/3, prior colonoscopy with poor prep.  Patient found to have ulcerated mucosa in the proximal rectum and in the rectosigmoid colon, rule out ischemic colitis, stercoral changes less likely IBD.  Biopsies were obtained.  GI recommended MiraLAX  daily.  Patient has been having issues with constipation. - hb  stable Per GI resume anticoagulation in 5 days  Chronic systolic CHF -Last echo with EF 35-40%, severe LVH, concern for Takotsubo cardiomyopathy - Recent hospitalization with low output failure requiring milrinone  -K low 2.8. will resume spironolactone . Will resume torsemide  tomorrow.   Dysphagia:  Start PPI Speech consulted.  Oral thrush, start Nystatin.   Hypokalemia Placed today again with IV and orally.  CKD 3a - Previous creatinine 1.3--- 1.8 (4 weeks ago) - Creatinine increased to 1.8--- from 1.6 Plan to hold torsemide  and spironolactone  today due to n.p.o. status Renal function stable    Paroxysmal A-fib - Rate controlled, in sinus rhythm, continue amiodarone , Eliquis  held as above  Chronic pain, narcotic dependence - On oral Dilaudid  at baseline  Dementia, cognitive deficits Delirium precaution Worsening confusion today.  Urine dark, check UA.   Leukocytosis;  Check UA, and Chest x ray   Hyponatremia; resolving in the setting of heart failure   DVT prophylaxis: SCDs Code Status: DNR Family Communication:  Disposition Plan: SNF likely in 24 hours  Consultants: GI   Objective: Vitals:   09/30/23 2337 10/01/23 0504 10/01/23 0803 10/01/23 1220  BP: 136/86 (!) 150/78 137/75 137/74  Pulse: 79 76 81 79  Resp: 16 16 17 17   Temp: 98.2 F (36.8 C) 98.1 F (36.7 C) 98.5 F (36.9 C) 98 F (36.7 C)  TempSrc: Oral Oral Oral Oral  SpO2: 96% 97%    Weight:  74.8 kg    Height:        Intake/Output Summary (Last 24 hours) at 10/01/2023 1417 Last data filed at 10/01/2023 1300 Gross per 24 hour  Intake 1280 ml  Output 1000 ml  Net 280 ml   Filed Weights   09/27/23 1708 10/01/23 0504  Weight: 80.3 kg 74.8 kg    Examination:  General exam: NAD Respiratory system: BL air movement no wheezing Cardiovascular system: S 1, S 2 RRR Abd: BS present, soft, nt Extremities: No edema   Data Reviewed:   CBC: Recent Labs  Lab 09/27/23 1007 09/27/23 1450 09/28/23 0435 09/28/23 1304 09/28/23 1733 09/29/23 1007 09/29/23 1442 09/30/23 0413 09/30/23 1154 10/01/23 0843  WBC 8.8  --  9.1 8.2  --   --   --  9.5  --  15.0*  NEUTROABS 6.7  --   --   --   --   --   --   --   --   --  HGB 11.0*   < > 10.2* 10.4*   < > 10.2* 10.2* 10.5* 10.5* 10.0*  HCT 32.9*   < > 29.7* 31.2*   < > 30.7* 30.0* 31.9* 31.0* 31.1*  MCV 92.4  --  91.7 92.6  --   --   --  93.0  --  94.8  PLT 335  --  337 340  --   --   --  371  --  365   < > = values in this interval not displayed.   Basic Metabolic Panel: Recent Labs  Lab 09/29/23 0613 09/29/23 1007 09/29/23 1054  09/29/23 1420 09/29/23 1442 09/30/23 0413 09/30/23 1154 10/01/23 0843  NA 139  --  139 141 138 146* 145 140  K 2.5*  --  2.9* 3.2* 3.1* 2.7* 3.7 2.8*  CL 98  --  98 99 99 104 108 103  CO2 24  --  26 25  --  27  --  27  GLUCOSE 128*  --  119* 109* 112* 133* 110* 109*  BUN 17  --  18 19 24* 17 22 13   CREATININE 1.83*  --  1.77* 1.75* 1.50* 1.65* 1.40* 1.43*  CALCIUM 8.7*  --  9.0 8.9  --  9.1  --  9.0  MG  --  2.3  --   --   --   --   --   --    GFR: Estimated Creatinine Clearance: 45.8 mL/min (A) (by C-G formula based on SCr of 1.43 mg/dL (H)). Liver Function Tests: Recent Labs  Lab 09/27/23 1007 09/28/23 0435  AST 24 17  ALT 21 16  ALKPHOS 64 56  BILITOT 0.8 1.0  PROT 6.2* 5.7*  ALBUMIN  3.3* 3.0*   No results for input(s): "LIPASE", "AMYLASE" in the last 168 hours. No results for input(s): "AMMONIA" in the last 168 hours. Coagulation Profile: Recent Labs  Lab 09/27/23 1007  INR 1.5*   Cardiac Enzymes: No results for input(s): "CKTOTAL", "CKMB", "CKMBINDEX", "TROPONINI" in the last 168 hours. BNP (last 3 results) No results for input(s): "PROBNP" in the last 8760 hours. HbA1C: No results for input(s): "HGBA1C" in the last 72 hours. CBG: No results for input(s): "GLUCAP" in the last 168 hours. Lipid Profile: No results for input(s): "CHOL", "HDL", "LDLCALC", "TRIG", "CHOLHDL", "LDLDIRECT" in the last 72 hours. Thyroid Function Tests: No results for input(s): "TSH", "T4TOTAL", "FREET4", "T3FREE", "THYROIDAB" in the last 72 hours. Anemia Panel: No results for input(s): "VITAMINB12", "FOLATE", "FERRITIN", "TIBC", "IRON", "RETICCTPCT" in the last 72 hours. Urine analysis:    Component Value Date/Time   COLORURINE YELLOW 11/11/2020 0041   APPEARANCEUR CLEAR 11/11/2020 0041   LABSPEC 1.018 11/11/2020 0041   PHURINE 6.0 11/11/2020 0041   GLUCOSEU NEGATIVE 11/11/2020 0041   HGBUR SMALL (A) 11/11/2020 0041   BILIRUBINUR NEGATIVE 11/11/2020 0041   KETONESUR NEGATIVE  11/11/2020 0041   PROTEINUR NEGATIVE 11/11/2020 0041   UROBILINOGEN 0.2 05/15/2011 1151   NITRITE NEGATIVE 11/11/2020 0041   LEUKOCYTESUR NEGATIVE 11/11/2020 0041   Sepsis Labs: @LABRCNTIP (procalcitonin:4,lacticidven:4)  )No results found for this or any previous visit (from the past 240 hours).   Radiology Studies: DG CHEST PORT 1 VIEW Result Date: 10/01/2023 CLINICAL DATA:  Leukocytosis. EXAM: PORTABLE CHEST 1 VIEW COMPARISON:  Chest radiograph dated 09/06/2023 FINDINGS: Stable eventration of the right hemidiaphragm. There is mild cardiomegaly and mild vascular congestion. No new consolidation, pleural effusion or pneumothorax. Atherosclerotic calcification of the aorta. No acute osseous pathology. IMPRESSION: Mild cardiomegaly and  mild vascular congestion. No new consolidation. Electronically Signed   By: Angus Bark M.D.   On: 10/01/2023 14:08     Scheduled Meds:  amiodarone   200 mg Oral Daily   feeding supplement  1 Container Oral TID BM   nystatin  5 mL Oral QID   pantoprazole  (PROTONIX ) IV  40 mg Intravenous Q12H   polyethylene glycol  17 g Oral Daily   potassium chloride   40 mEq Oral Once   [START ON 10/02/2023] sertraline   25 mg Oral TID   sodium chloride  flush  3-10 mL Intravenous Q12H   spironolactone   25 mg Oral Daily   tamsulosin   0.4 mg Oral QPC breakfast   Continuous Infusions:  potassium chloride  10 mEq (10/01/23 1403)     LOS: 4 days    Time spent:    Catharine Clock , MD [facility]   10/01/2023, 2:17 PM

## 2023-10-02 ENCOUNTER — Inpatient Hospital Stay (HOSPITAL_COMMUNITY)

## 2023-10-02 ENCOUNTER — Encounter (HOSPITAL_COMMUNITY): Payer: Self-pay | Admitting: Internal Medicine

## 2023-10-02 DIAGNOSIS — K625 Hemorrhage of anus and rectum: Secondary | ICD-10-CM | POA: Diagnosis not present

## 2023-10-02 LAB — BASIC METABOLIC PANEL WITH GFR
Anion gap: 9 (ref 5–15)
BUN: 13 mg/dL (ref 8–23)
CO2: 25 mmol/L (ref 22–32)
Calcium: 8.7 mg/dL — ABNORMAL LOW (ref 8.9–10.3)
Chloride: 101 mmol/L (ref 98–111)
Creatinine, Ser: 1.16 mg/dL (ref 0.61–1.24)
GFR, Estimated: >60 mL/min (ref >60)
Glucose, Bld: 93 mg/dL (ref 70–99)
Potassium: 3.3 mmol/L — ABNORMAL LOW (ref 3.5–5.1)
Sodium: 135 mmol/L (ref 135–145)

## 2023-10-02 LAB — CBC
HCT: 30.1 % — ABNORMAL LOW (ref 39.0–52.0)
Hemoglobin: 9.7 g/dL — ABNORMAL LOW (ref 13.0–17.0)
MCH: 30.1 pg (ref 26.0–34.0)
MCHC: 32.2 g/dL (ref 30.0–36.0)
MCV: 93.5 fL (ref 80.0–100.0)
Platelets: 299 10*3/uL (ref 150–400)
RBC: 3.22 MIL/uL — ABNORMAL LOW (ref 4.22–5.81)
RDW: 13.4 % (ref 11.5–15.5)
WBC: 15.8 10*3/uL — ABNORMAL HIGH (ref 4.0–10.5)
nRBC: 0 % (ref 0.0–0.2)

## 2023-10-02 LAB — MAGNESIUM: Magnesium: 2 mg/dL (ref 1.7–2.4)

## 2023-10-02 MED ORDER — IPRATROPIUM-ALBUTEROL 0.5-2.5 (3) MG/3ML IN SOLN
3.0000 mL | RESPIRATORY_TRACT | Status: DC
  Filled 2023-10-02: qty 3

## 2023-10-02 MED ORDER — PANTOPRAZOLE SODIUM 40 MG PO TBEC
40.0000 mg | DELAYED_RELEASE_TABLET | Freq: Two times a day (BID) | ORAL | Status: DC
  Administered 2023-10-02 – 2023-10-03 (×2): 40 mg via ORAL
  Filled 2023-10-02 (×2): qty 1

## 2023-10-02 MED ORDER — IOHEXOL 350 MG/ML SOLN
75.0000 mL | Freq: Once | INTRAVENOUS | Status: AC | PRN
  Administered 2023-10-02: 75 mL via INTRAVENOUS

## 2023-10-02 MED ORDER — LEVALBUTEROL HCL 0.63 MG/3ML IN NEBU
0.6300 mg | INHALATION_SOLUTION | Freq: Four times a day (QID) | RESPIRATORY_TRACT | Status: DC | PRN
  Administered 2023-10-02 (×2): 0.63 mg via RESPIRATORY_TRACT
  Filled 2023-10-02 (×2): qty 3

## 2023-10-02 MED ORDER — AMOXICILLIN-POT CLAVULANATE 875-125 MG PO TABS
1.0000 | ORAL_TABLET | Freq: Two times a day (BID) | ORAL | Status: DC
  Administered 2023-10-02 – 2023-10-03 (×3): 1 via ORAL
  Filled 2023-10-02 (×3): qty 1

## 2023-10-02 MED ORDER — IPRATROPIUM BROMIDE 0.02 % IN SOLN: 0.5000 mg | Freq: Four times a day (QID) | RESPIRATORY_TRACT | Status: DC | PRN

## 2023-10-02 MED ORDER — POTASSIUM CHLORIDE CRYS ER 20 MEQ PO TBCR
40.0000 meq | EXTENDED_RELEASE_TABLET | Freq: Once | ORAL | Status: AC
  Administered 2023-10-02: 40 meq via ORAL
  Filled 2023-10-02: qty 2

## 2023-10-02 MED ORDER — BENZONATATE 100 MG PO CAPS
200.0000 mg | ORAL_CAPSULE | Freq: Three times a day (TID) | ORAL | Status: DC | PRN
  Administered 2023-10-02 – 2023-10-03 (×2): 200 mg via ORAL
  Filled 2023-10-02 (×2): qty 2

## 2023-10-02 NOTE — TOC Progression Note (Signed)
 Transition of Care [facility]) - Progression Note    Patient Details  Name: Brian Ruiz MRN: [id number] Date of Birth: 16-Jan-1946  Transition of [facility]) CM/SW Contact  Carmon Christen, LCSWA Phone Number: 10/02/2023, 11:46 AM  Clinical Narrative:     Patient has SNF bed at [facility] when medically stable for dc. CSW will continue to follow and assist with patients dc planning needs.   Expected Discharge Plan: Skilled Nursing Facility Barriers to Discharge: Continued Medical Work up  Expected Discharge Plan and Services In-house Referral: Clinical Social Work Discharge Planning Services: CM Consult Post Acute Care Choice: Skilled Nursing Facility, Resumption of Svcs/PTA Provider Living arrangements for the past 2 months: Skilled Nursing Facility Psychiatric nurse)                                       Social Determinants of Health (SDOH) Interventions SDOH Screenings   Food Insecurity: No Food Insecurity (09/28/2023)  Housing: Low Risk  (09/28/2023)  Transportation Needs: No Transportation Needs (09/28/2023)  Utilities: Not At Risk (09/28/2023)  Financial Resource Strain: Low Risk  (02/08/2021)   Received from [facility]  Physical Activity: Inactive (02/08/2021)   [facility]  Social Connections: Moderately Isolated (09/28/2023)  Stress: No Stress Concern Present (02/08/2021)   [facility]  Tobacco Use: Low Risk  (09/30/2023)    Readmission Risk Interventions    08/30/2023   12:30 PM  Readmission Risk Prevention Plan  Post Dischage Appt Complete  Medication Screening Complete  Transportation Screening Complete

## 2023-10-02 NOTE — Plan of Care (Signed)
  Problem: Clinical Measurements: Goal: Ability to maintain clinical measurements within normal limits will improve Outcome: Progressing   Problem: Clinical Measurements: Goal: Will remain free from infection Outcome: Progressing   Problem: Clinical Measurements: Goal: Diagnostic test results will improve Outcome: Progressing   Problem: Clinical Measurements: Goal: Respiratory complications will improve Outcome: Progressing   Problem: Clinical Measurements: Goal: Cardiovascular complication will be avoided Outcome: Progressing   Problem: Activity: Goal: Risk for activity intolerance will decrease Outcome: Progressing   Problem: Nutrition: Goal: Adequate nutrition will be maintained Outcome: Progressing   Problem: Pain Managment: Goal: General experience of comfort will improve and/or be controlled Outcome: Progressing

## 2023-10-02 NOTE — Progress Notes (Signed)
 Patient oxygen saturation on 2L 85-88%.  Pt self reports coughing up brown sputum.  Lungs with bilateral wheezing on auscultation.  Oxygen increased to 4L humidified with saturations 91-92%.  Dr. Del Favia notified with orders for duoneb.  Upon offering duoneb to patient, he declined twice and stated the oxygen made him feel better.

## 2023-10-02 NOTE — Progress Notes (Signed)
 PROGRESS NOTE    Brian Ruiz  ZHY:[id number] DOB: December 02, 1945 DOA: 09/27/2023 PCP: [facility]  77/M with systolic CHF, A-fib on Eliquis  memory, cognitive deficits, depression, anxiety, chronic pain, opioid dependence was recently hospitalized with hypoxic respiratory failure from CHF and COVID, complicated by cardiogenic shock, low output failure treated with milrinone  subsequently discharged to SNF with palliative care on 4/11 back in the ED 4/30 with rectal bleeding at SNF.  -VSS, labs noted mild downtrend in hemoglobin from 12.6 at discharge to 10.2 range now  Subjective: Reports cough and shortness of breath.  Reports back pain and chest pain.  Requiring more oxygen early this morning.  Assessment and Plan: Rectal bleeding,?  Hematochezia - Unreliable historian, mild downtrend in hemoglobin - Gastroenterology consulting, Eliquis  on hold, recommended CTA abdomen if he has recurrence - Monitor hemoglobin - Underwent repeated colonoscopy 5/3, prior colonoscopy with poor prep.  Patient found to have ulcerated mucosa in the proximal rectum and in the rectosigmoid colon, rule out ischemic colitis, stercoral changes less likely IBD.  Biopsies were obtained.  GI recommended MiraLAX  daily.  Patient has been having issues with constipation. - hb  stable Per GI resume anticoagulation in 5 days  Chronic systolic CHF -Last echo with EF 35-40%, severe LVH, concern for Takotsubo cardiomyopathy - Recent hospitalization with low output failure requiring milrinone  Continue torsemide  and Aldactone .  Shortness of breath with acute on chronic hypoxic respiratory failure. Was on 2 LPM.  Currently requiring 4 LPM. CT PE protocol negative for PE. Possible pneumonia is reported on the CT scan. Currently on antibiotic.  Monitor.  Dysphagia:  Start PPI Speech consulted.  Oral thrush, start Nystatin.   Hypokalemia Treated.  CKD 3a Renal function stable.  Will monitor.  Paroxysmal  A-fib - Rate controlled, in sinus rhythm, continue amiodarone , Eliquis  held as above  Chronic pain, narcotic dependence - On oral Dilaudid  at baseline  Dementia, cognitive deficits Delirium precaution Worsening confusion today.  Urine dark, check UA.   Leukocytosis;  On antibiotic.  Hyponatremia; resolving in the setting of heart failure   DVT prophylaxis: SCDs Code Status: DNR Family Communication:  Disposition Plan: SNF likely in 24 hours  Consultants: GI   Objective: Vitals:   10/02/23 0846 10/02/23 1300 10/02/23 1610 10/02/23 1719  BP: 132/78  103/68   Pulse: 78 83 86   Resp: 17 18 18    Temp: 97.8 F (36.6 C) 98.2 F (36.8 C) 97.9 F (36.6 C)   TempSrc: Oral Oral Oral   SpO2:    93%  Weight:      Height:        Intake/Output Summary (Last 24 hours) at 10/02/2023 2028 Last data filed at 10/02/2023 1650 Gross per 24 hour  Intake 1080 ml  Output 1900 ml  Net -820 ml   Filed Weights   09/27/23 1708 10/01/23 0504  Weight: 80.3 kg 74.8 kg    Examination:  In severe distress. S1-S2 present.  Aortic systolic murmur. Clear to auscultation anteriorly.  Bilateral basal crackles. Bowel sound present.  Nontender. No edema.  Data Reviewed:   CBC: Recent Labs  Lab 09/27/23 1007 09/27/23 1450 09/28/23 0435 09/28/23 1304 09/28/23 1733 09/29/23 1442 09/30/23 0413 09/30/23 1154 10/01/23 0843 10/02/23 0601  WBC 8.8  --  9.1 8.2  --   --  9.5  --  15.0* 15.8*  NEUTROABS 6.7  --   --   --   --   --   --   --   --   --  HGB 11.0*   < > 10.2* 10.4*   < > 10.2* 10.5* 10.5* 10.0* 9.7*  HCT 32.9*   < > 29.7* 31.2*   < > 30.0* 31.9* 31.0* 31.1* 30.1*  MCV 92.4  --  91.7 92.6  --   --  93.0  --  94.8 93.5  PLT 335  --  337 340  --   --  371  --  365 299   < > = values in this interval not displayed.   Basic Metabolic Panel: Recent Labs  Lab 09/29/23 1007 09/29/23 1054 09/29/23 1420 09/29/23 1442 09/30/23 0413 09/30/23 1154 10/01/23 0843 10/02/23 0601   NA  --  139 141 138 146* 145 140 135  K  --  2.9* 3.2* 3.1* 2.7* 3.7 2.8* 3.3*  CL  --  98 99 99 104 108 103 101  CO2  --  26 25  --  27  --  27 25  GLUCOSE  --  119* 109* 112* 133* 110* 109* 93  BUN  --  18 19 24* 17 22 13 13   CREATININE  --  1.77* 1.75* 1.50* 1.65* 1.40* 1.43* 1.16  CALCIUM  --  9.0 8.9  --  9.1  --  9.0 8.7*  MG 2.3  --   --   --   --   --   --  2.0   GFR: Estimated Creatinine Clearance: 56.4 mL/min (by C-G formula based on SCr of 1.16 mg/dL). Liver Function Tests: Recent Labs  Lab 09/27/23 1007 09/28/23 0435  AST 24 17  ALT 21 16  ALKPHOS 64 56  BILITOT 0.8 1.0  PROT 6.2* 5.7*  ALBUMIN  3.3* 3.0*   No results for input(s): "LIPASE", "AMYLASE" in the last 168 hours. No results for input(s): "AMMONIA" in the last 168 hours. Coagulation Profile: Recent Labs  Lab 09/27/23 1007  INR 1.5*   Cardiac Enzymes: No results for input(s): "CKTOTAL", "CKMB", "CKMBINDEX", "TROPONINI" in the last 168 hours. BNP (last 3 results) No results for input(s): "PROBNP" in the last 8760 hours. HbA1C: No results for input(s): "HGBA1C" in the last 72 hours. CBG: No results for input(s): "GLUCAP" in the last 168 hours. Lipid Profile: No results for input(s): "CHOL", "HDL", "LDLCALC", "TRIG", "CHOLHDL", "LDLDIRECT" in the last 72 hours. Thyroid Function Tests: No results for input(s): "TSH", "T4TOTAL", "FREET4", "T3FREE", "THYROIDAB" in the last 72 hours. Anemia Panel: No results for input(s): "VITAMINB12", "FOLATE", "FERRITIN", "TIBC", "IRON", "RETICCTPCT" in the last 72 hours. Urine analysis:    Component Value Date/Time   COLORURINE YELLOW 10/01/2023 1541   APPEARANCEUR CLOUDY (A) 10/01/2023 1541   LABSPEC 1.009 10/01/2023 1541   PHURINE 6.0 10/01/2023 1541   GLUCOSEU >=500 (A) 10/01/2023 1541   HGBUR SMALL (A) 10/01/2023 1541   BILIRUBINUR NEGATIVE 10/01/2023 1541   KETONESUR NEGATIVE 10/01/2023 1541   PROTEINUR 30 (A) 10/01/2023 1541   UROBILINOGEN 0.2  05/15/2011 1151   NITRITE NEGATIVE 10/01/2023 1541   LEUKOCYTESUR LARGE (A) 10/01/2023 1541   Sepsis Labs: @LABRCNTIP (procalcitonin:4,lacticidven:4)  )No results found for this or any previous visit (from the past 240 hours).   Radiology Studies: CT Angio Chest Pulmonary Embolism (PE) W or WO Contrast Result Date: 10/02/2023 CLINICAL DATA:  Pulmonary embolism EXAM: CT ANGIOGRAPHY CHEST WITH CONTRAST TECHNIQUE: Multidetector CT imaging of the chest was performed using the standard protocol during bolus administration of intravenous contrast. Multiplanar CT image reconstructions and MIPs were obtained to evaluate the vascular anatomy. RADIATION DOSE REDUCTION: This exam was  performed according to the departmental dose-optimization program which includes automated exposure control, adjustment of the mA and/or kV according to patient size and/or use of iterative reconstruction technique. CONTRAST:  75mL OMNIPAQUE  IOHEXOL  350 MG/ML SOLN COMPARISON:  None Available. FINDINGS: Cardiovascular: Satisfactory opacification of the pulmonary arteries to the segmental level. No evidence of pulmonary embolism. Normal heart size. No pericardial effusion. Coronary artery calcifications Mediastinum/Nodes: No enlarged mediastinal, hilar, or axillary lymph nodes. Thyroid gland, trachea, and esophagus demonstrate no significant findings. Lungs/Pleura: Ill-defined infiltrates and consolidative changes of the left upper lobe ill-defined partially consolidating infiltrates and atelectasis of the right upper lobe. Chronic elevation of the right hemidiaphragm. 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 IMPRESSION: *No evidence of pulmonary embolism. *Ill-defined infiltrates and consolidative changes of the left upper lobe and  ill-defined partially consolidating infiltrates and atelectasis of the right upper lobe. *Left hydronephrosis, correlate with CT abdomen as clinically needed. 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 Electronically Signed   By: Fredrich Jefferson M.D.   On: 10/02/2023 13:24   DG CHEST PORT 1 VIEW Result Date: 10/01/2023 CLINICAL DATA:  Leukocytosis. EXAM: PORTABLE CHEST 1 VIEW COMPARISON:  Chest radiograph dated 09/06/2023 FINDINGS: Stable eventration of the right hemidiaphragm. There is mild cardiomegaly and mild vascular congestion. No new consolidation, pleural effusion or pneumothorax. Atherosclerotic calcification of the aorta. No acute osseous pathology. IMPRESSION: Mild cardiomegaly and mild vascular congestion. No new consolidation. Electronically Signed   By: Angus Bark M.D.   On: 10/01/2023 14:08     Scheduled Meds:  amiodarone   200 mg Oral Daily   amoxicillin-clavulanate  1 tablet Oral Q12H   feeding supplement  1 Container Oral TID BM   nystatin  5 mL Oral QID   pantoprazole   40 mg Oral BID AC   polyethylene glycol  17 g Oral Daily   sertraline   25 mg Oral TID   sodium chloride  flush  3-10 mL Intravenous Q12H   spironolactone   25 mg Oral Daily   tamsulosin   0.4 mg Oral QPC breakfast   torsemide   20 mg Oral Daily   Continuous Infusions:     LOS: 5 days    Time spent:    Electronically signed: Charlean Congress, MD [facility] 10/02/2023 8:30 PM

## 2023-10-03 DIAGNOSIS — K625 Hemorrhage of anus and rectum: Secondary | ICD-10-CM | POA: Diagnosis not present

## 2023-10-03 LAB — BASIC METABOLIC PANEL WITH GFR
Anion gap: 10 (ref 5–15)
BUN: 17 mg/dL (ref 8–23)
CO2: 26 mmol/L (ref 22–32)
Calcium: 8.4 mg/dL — ABNORMAL LOW (ref 8.9–10.3)
Chloride: 97 mmol/L — ABNORMAL LOW (ref 98–111)
Creatinine, Ser: 1.35 mg/dL — ABNORMAL HIGH (ref 0.61–1.24)
GFR, Estimated: 54 mL/min — ABNORMAL LOW (ref >60)
Glucose, Bld: 99 mg/dL (ref 70–99)
Potassium: 3.5 mmol/L (ref 3.5–5.1)
Sodium: 133 mmol/L — ABNORMAL LOW (ref 135–145)

## 2023-10-03 LAB — CBC
HCT: 30 % — ABNORMAL LOW (ref 39.0–52.0)
Hemoglobin: 9.9 g/dL — ABNORMAL LOW (ref 13.0–17.0)
MCH: 30.4 pg (ref 26.0–34.0)
MCHC: 33 g/dL (ref 30.0–36.0)
MCV: 92 fL (ref 80.0–100.0)
Platelets: 250 10*3/uL (ref 150–400)
RBC: 3.26 MIL/uL — ABNORMAL LOW (ref 4.22–5.81)
RDW: 13.5 % (ref 11.5–15.5)
WBC: 18.3 10*3/uL — ABNORMAL HIGH (ref 4.0–10.5)
nRBC: 0 % (ref 0.0–0.2)

## 2023-10-03 LAB — SURGICAL PATHOLOGY

## 2023-10-03 MED ORDER — APIXABAN 5 MG PO TABS: 5.0000 mg | ORAL_TABLET | Freq: Two times a day (BID) | ORAL | Status: DC

## 2023-10-03 MED ORDER — NYSTATIN 100000 UNIT/ML MT SUSP: 5.0000 mL | Freq: Four times a day (QID) | OROMUCOSAL | 0 refills | Status: DC

## 2023-10-03 MED ORDER — IPRATROPIUM BROMIDE 0.02 % IN SOLN: 0.5000 mg | Freq: Four times a day (QID) | RESPIRATORY_TRACT | 12 refills | Status: DC | PRN

## 2023-10-03 MED ORDER — GUAIFENESIN ER 600 MG PO TB12
1200.0000 mg | ORAL_TABLET | Freq: Two times a day (BID) | ORAL | Status: DC
  Administered 2023-10-03: 1200 mg via ORAL
  Filled 2023-10-03: qty 2

## 2023-10-03 MED ORDER — BENZONATATE 100 MG PO CAPS: 100.0000 mg | ORAL_CAPSULE | Freq: Three times a day (TID) | ORAL | 0 refills | Status: DC

## 2023-10-03 MED ORDER — HYDROMORPHONE HCL 4 MG PO TABS: 4.0000 mg | ORAL_TABLET | Freq: Three times a day (TID) | ORAL | 0 refills | Status: DC

## 2023-10-03 MED ORDER — BENZONATATE 100 MG PO CAPS: 100.0000 mg | ORAL_CAPSULE | Freq: Three times a day (TID) | ORAL | Status: DC

## 2023-10-03 MED ORDER — LEVALBUTEROL HCL 0.63 MG/3ML IN NEBU: 0.6300 mg | INHALATION_SOLUTION | Freq: Four times a day (QID) | RESPIRATORY_TRACT | 12 refills | Status: DC | PRN

## 2023-10-03 MED ORDER — DEXTROMETHORPHAN POLISTIREX ER 30 MG/5ML PO SUER
15.0000 mg | Freq: Two times a day (BID) | ORAL | Status: DC
  Administered 2023-10-03: 15 mg via ORAL
  Filled 2023-10-03 (×2): qty 5

## 2023-10-03 MED ORDER — GUAIFENESIN ER 600 MG PO TB12: 1200.0000 mg | ORAL_TABLET | Freq: Two times a day (BID) | ORAL | 0 refills | Status: AC

## 2023-10-03 MED ORDER — AMOXICILLIN-POT CLAVULANATE 875-125 MG PO TABS: 1.0000 | ORAL_TABLET | Freq: Two times a day (BID) | ORAL | 0 refills | Status: AC

## 2023-10-03 MED ORDER — DEXTROMETHORPHAN POLISTIREX ER 30 MG/5ML PO SUER: 15.0000 mg | Freq: Two times a day (BID) | ORAL | 0 refills | Status: DC

## 2023-10-03 MED ORDER — POLYETHYLENE GLYCOL 3350 17 G PO PACK: 17.0000 g | PACK | Freq: Every day | ORAL | 0 refills | Status: DC

## 2023-10-03 NOTE — TOC Progression Note (Signed)
 Transition of Care [facility]) - Progression Note    Patient Details  Name: SAPAN HOIT MRN: [id number] Date of Birth: 1945/08/14  [facility]) CM/SW Contact  Carmon Christen, LCSWA Phone Number: 10/03/2023, 11:18 AM  Clinical Narrative:     Patient has SNF bed at [facility]. Facility confirmed they can accept patient today if medically stable. CSW informed MD. CSW will continue to follow.  Expected Discharge Plan: Skilled Nursing Facility Barriers to Discharge: Continued Medical Work up  Expected Discharge Plan and Services In-house Referral: Clinical Social Work Discharge Planning Services: CM Consult Post Acute Care Choice: Skilled Nursing Facility, Resumption of Svcs/PTA Provider Living arrangements for the past 2 months: Skilled Nursing Facility Psychiatric nurse)                                       Social Determinants of Health (SDOH) Interventions SDOH Screenings   Food Insecurity: No Food Insecurity (09/28/2023)  Housing: Low Risk  (09/28/2023)  Transportation Needs: No Transportation Needs (09/28/2023)  Utilities: Not At Risk (09/28/2023)  Financial Resource Strain: Low Risk  (02/08/2021)   Received from [facility], [facility]  Physical Activity: Inactive (02/08/2021)   [facility], [facility]  Social Connections: Moderately Isolated (09/28/2023)  Stress: No Stress Concern Present (02/08/2021)   [facility]  Tobacco Use: Low Risk  (09/30/2023)    Readmission Risk Interventions    08/30/2023   12:30 PM  Readmission Risk Prevention Plan  Post Dischage Appt Complete  Medication Screening Complete  Transportation Screening Complete

## 2023-10-03 NOTE — Plan of Care (Signed)
 Discharged.  Problem: Education: Goal: Knowledge of General Education information will improve Description: Including pain rating scale, medication(s)/side effects and non-pharmacologic comfort measures Outcome: Completed/Met   Problem: Health Behavior/Discharge Planning: Goal: Ability to manage health-related needs will improve Outcome: Completed/Met   Problem: Clinical Measurements: Goal: Ability to maintain clinical measurements within normal limits will improve Outcome: Completed/Met Goal: Will remain free from infection Outcome: Completed/Met Goal: Diagnostic test results will improve Outcome: Completed/Met Goal: Respiratory complications will improve Outcome: Completed/Met Goal: Cardiovascular complication will be avoided Outcome: Completed/Met   Problem: Activity: Goal: Risk for activity intolerance will decrease Outcome: Completed/Met   Problem: Nutrition: Goal: Adequate nutrition will be maintained Outcome: Completed/Met   Problem: Coping: Goal: Level of anxiety will decrease Outcome: Completed/Met   Problem: Elimination: Goal: Will not experience complications related to bowel motility Outcome: Completed/Met Goal: Will not experience complications related to urinary retention Outcome: Completed/Met   Problem: Pain Managment: Goal: General experience of comfort will improve and/or be controlled Outcome: Completed/Met   Problem: Safety: Goal: Ability to remain free from injury will improve Outcome: Completed/Met   Problem: Skin Integrity: Goal: Risk for impaired skin integrity will decrease Outcome: Completed/Met

## 2023-10-03 NOTE — TOC Transition Note (Signed)
 Transition of Care [facility]) - Discharge Note   Patient Details  Name: Brian Ruiz MRN: [id number] Date of Birth: Jan 05, 1946  Transition of Care [facility]) CM/SW Contact:  Carmon Christen, LCSWA Phone Number: 10/03/2023, 1:29 PM   Clinical Narrative:     Patient will DC to: [facility]   Anticipated DC date: 10/03/2023  Family notified: Willetta Harpin  Transport by: Lyna Sandhoff ?  Per MD patient ready for DC to [facility] with palliative services to follow. RN, patient, patient's family,Shawn with [facility], and facility notified of DC. Discharge Summary sent to facility. RN given number for report [phone] RM# [id number]. DC packet on chart. DNR signed by MD attached to patients DC packet.Ambulance transport requested for patient.  CSW signing off.   Final next level of care: Skilled Nursing Facility Barriers to Discharge: No Barriers Identified   Patient Goals and CMS Choice Patient states their goals for this hospitalization and ongoing recovery are:: SNF   Choice offered to / list presented to : Patient, Spouse      Discharge Placement              Patient chooses bed at: [facility] Patient to be transferred to facility by: [facility] Name of family member notified: Willetta Harpin Patient and family notified of of transfer: 10/03/23  Discharge Plan and Services Additional resources added to the After Visit Summary for   In-house Referral: Clinical Social Work Discharge Planning Services: CM Consult Post Acute Care Choice: Skilled Nursing Facility, Resumption of Svcs/PTA Provider                               Social Drivers of Health (SDOH) Interventions SDOH Screenings   Food Insecurity: No Food Insecurity (09/28/2023)  Housing: Low Risk  (09/28/2023)  Transportation Needs: No Transportation Needs (09/28/2023)  Utilities: Not At Risk (09/28/2023)  Financial Resource Strain: Low Risk  (02/08/2021)   Received from [facility], [facility]  Physical Activity: Inactive (02/08/2021)    [facility], [facility]  Social Connections: Moderately Isolated (09/28/2023)  Stress: No Stress Concern Present (02/08/2021)   [facility]  Tobacco Use: Low Risk  (09/30/2023)     Readmission Risk Interventions    08/30/2023   12:30 PM  Readmission Risk Prevention Plan  Post Dischage Appt Complete  Medication Screening Complete  Transportation Screening Complete

## 2023-10-03 NOTE — Discharge Summary (Addendum)
 Physician Discharge Summary   Patient: Brian Ruiz MRN: [id number] DOB: 01-Aug-1945  Admit date:     09/27/2023  Discharge date: 10/03/23  Discharge Physician: Charlean Congress  PCP: [facility]  Recommendations at discharge: Follow-up with PCP in 1 week with a CBC and BMP. Establish care with outpatient palliative care for goals of care conversation. Follow-up with advanced heart failure clinic.  Can discuss Farxiga  at that visit. Establish and follow-up care with [facility] for left-sided hydronephrosis.  Repeat ultrasound in 2 weeks. Resuming Eliquis  on 10/07/2023 per discussion with GI. Repeat chest x-ray in 1 month.   Follow-up Information     [facility]. Schedule an appointment as soon as possible for a visit in 1 week(s).   Contact information: [address] [place] [address] [phone]         [facility]. Schedule an appointment as soon as possible for a visit in 1 week(s).   Specialty: Cardiology Contact information: [address] [place]  [address] [phone]        [facility] Follow up.   Why: To Establish Care Contact information: [address] [facility] [place]  [address]        [facility]. Call.   Specialty: Gastroenterology Why: As needed Contact information: [address] [place]  [address] [phone]        [facility]. Schedule an appointment as soon as possible for a visit in 2 week(s).   Why: To Establish Care Contact information: [address] [place]   [address] [phone]                Discharge Diagnoses: Principal Problem:   Rectal bleeding Active Problems:   ABLA (acute blood loss anemia)   Benign neoplasm of sigmoid colon   Benign neoplasm of ascending colon   Colon ulcer  Hospital Course: 78/M with  systolic CHF, A-fib on Eliquis  memory, cognitive deficits, depression, anxiety, chronic pain, opioid dependence was recently hospitalized with hypoxic respiratory failure from CHF and COVID, complicated by cardiogenic shock, low output failure treated with milrinone  subsequently discharged to SNF with palliative care on 4/11 back in the ED 4/30 with rectal bleeding at SNF.  -VSS, labs noted mild downtrend in hemoglobin from 12.6 at discharge to 10.2 range now   Subjective: Reports cough and shortness of breath.  Reports back pain and chest pain.  Requiring more oxygen early this morning.   Assessment and Plan: Rectal ulceration with BRBPR and hematochezia. Likely stercoral ulceration based on the colonoscopy. Presented with low hemoglobin and BRBPR. GI was consulted but Patient was on Eliquis  which was on hold. Underwent colonoscopy on 5 3. Patient found to have ulcerated mucosa in the proximal rectum and in the rectosigmoid colon, rule out ischemic colitis, stercoral changes less likely IBD.  Biopsies were obtained.  GI recommended MiraLAX  daily.  Patient has been having issues with constipation. hb  stable after colonoscopy with stopping anticoagulation.  Per GI resume anticoagulation in 5 days.  Can directly start taking Eliquis  per my conversation with GI.   Chronic systolic CHF Last echo with EF 35-40%, severe LVH, concern for Takotsubo cardiomyopathy Recent hospitalization with low output failure requiring milrinone  Continue torsemide  and Aldactone . Outpatient follow-up with advanced heart failure clinic.  Likely aspiration pneumonia.  Versus aspiration pneumonitis. Developed shortness of breath on 5/4. Concern for aspiration event. SLP was consulted although patient passed a swallow evaluation at  bedside. No further aspiration reported. A CT PE protocol was performed which was negative for PE. Shows evidence of extensive pneumonia on the left side. Concerning for an aspiration  event versus pneumonitis. Will be initiating antibiotic for 1 week. Patient is feeling better on 5/6.  Wants to go to SNF. Repeat chest x-ray in 1 month.   Dysphagia:  Continue PPI.  Concern for oral thrush Started on nystatin. Currently thrush appears to be clearing. Will continue for total 10 days.   Hypokalemia Treated.   CKD 3a Baseline serum creatinine appears to be around 1.1-0.9. On admission serum creatinine was 1.5.  Improving from recent hospitalization where his serum creatinine was 1.87. Currently fluctuating between 1.1 and 1.4. Outpatient follow-up with PCP with a BMP recommended. Patient did receive IV contrast to rule out PE, recommend to recheck BMP in 1 week.  Currently not on any nephrotoxic medication.  Patient will require diuretics given his advanced heart failure.  Left-sided hydronephrosis. Underwent CT PE protocol. Incidentally was found to have left-sided hydronephrosis. Ultrasound renal was performed.  That was negative for any obstruction or stone.  Recommend outpatient follow-up with urology with repeat ultrasound.   Paroxysmal A-fib Rate controlled, in sinus rhythm, continue amiodarone , Eliquis  held as above Resume in 5/10.   Chronic pain, narcotic dependence On oral Dilaudid  at baseline Continue the same for now.     Dementia, cognitive deficits Delirium precaution Mentation better.   Leukocytosis;  On antibiotic.   Hyponatremia;  Resolving In the setting of heart failure  Goals of care conversation. Recently hospitalized.  Was recommended to establish care with palliative care outpatient.  I have placed a consult to [facility] palliative care.  Prognosis is poor.  I offered  if the patient wants me to discuss with his family with regards to medical plan, patient currently denied.  Pain control - [place]  Controlled Substance Reporting System database was reviewed. and patient was instructed, not to drive, operate heavy  machinery, perform activities at heights, swimming or participation in water activities or provide baby-sitting services while on Pain, Sleep and Anxiety Medications; until their outpatient Physician has advised to do so again. Also recommended to not to take more than prescribed Pain, Sleep and Anxiety Medications.  Consultants:  GI  Procedures performed:  Colonoscopy  DISCHARGE MEDICATION: Allergies as of 10/03/2023       Reactions   Nsaids Shortness Of Breath   Other reaction(s): Bronchospasm (ALLERGY/intolerance)   Oxycodone  Other (See Comments)   Agitation. Per the any pain medication that has codeine in it causes that reaction. Other reaction(s): Other Agitation. Per the any pain medication that has codeine in it causes that reaction.   Tolmetin Shortness Of Breath   Tramadol Anaphylaxis   Fentanyl  Other (See Comments)   Codeine Anxiety   Other reaction(s): Bronchospasm (ALLERGY/intolerance), Other (See Comments) Irritable        Medication List     PAUSE taking these medications    dapagliflozin  propanediol 10 MG Tabs tablet Wait to take this until your doctor or other care provider tells you to start again. Commonly known as: FARXIGA  Take 10 mg by mouth daily.       TAKE these medications    acetaminophen  500 MG tablet Commonly known as: TYLENOL  Take 1,000 mg by mouth every 8 (eight) hours as needed for mild pain (pain score 1-3) or moderate pain (pain score 4-6). For pain.   amiodarone  200 MG tablet Commonly known as: PACERONE  Take 200 mg by mouth  daily. What changed: Another medication with the same name was removed. Continue taking this medication, and follow the directions you see here.   amoxicillin-clavulanate 875-125 MG tablet Commonly known as: AUGMENTIN Take 1 tablet by mouth 2 (two) times daily for 7 days.   apixaban  5 MG Tabs tablet Commonly known as: ELIQUIS  Take 1 tablet (5 mg total) by mouth 2 (two) times daily. Start taking on: Oct 07, 2023 What changed: These instructions start on Oct 07, 2023. If you are unsure what to do until then, ask your doctor or other care provider.   benzonatate 100 MG capsule Commonly known as: TESSALON Take 1 capsule (100 mg total) by mouth 3 (three) times daily.   CLEAR EYES OP Place 1 drop into both eyes daily as needed (dry eye).   dextromethorphan 30 MG/5ML liquid Commonly known as: DELSYM Take 2.5 mLs (15 mg total) by mouth 2 (two) times daily.   DRY EYES OP Apply 1 drop to eye daily as needed (dry eyes).   gabapentin  400 MG capsule Commonly known as: NEURONTIN  Take 400 mg by mouth at bedtime as needed (back pain).   guaiFENesin  600 MG 12 hr tablet Commonly known as: MUCINEX  Take 2 tablets (1,200 mg total) by mouth 2 (two) times daily for 7 days.   HYDROmorphone  4 MG tablet Commonly known as: DILAUDID  Take 1 tablet (4 mg total) by mouth every 8 (eight) hours.   ipratropium 0.02 % nebulizer solution Commonly known as: ATROVENT Take 2.5 mLs (0.5 mg total) by nebulization every 6 (six) hours as needed (wheezing).   levalbuterol 0.63 MG/3ML nebulizer solution Commonly known as: XOPENEX Take 3 mLs (0.63 mg total) by nebulization every 6 (six) hours as needed for wheezing or shortness of breath.   nystatin 100000 UNIT/ML suspension Commonly known as: MYCOSTATIN Take 5 mLs (500,000 Units total) by mouth 4 (four) times daily for 10 days.   omeprazole 40 MG capsule Commonly known as: PRILOSEC Take 40 mg by mouth daily.   oxymetazoline 0.05 % nasal spray Commonly known as: AFRIN Place 1 spray into both nostrils as needed (nose bleeds).   polyethylene glycol 17 g packet Commonly known as: MIRALAX  / GLYCOLAX  Take 17 g by mouth daily. Start taking on: Oct 04, 2023   senna-docusate 8.6-50 MG tablet Commonly known as: Senokot-S Take 2 tablets by mouth 2 (two) times daily as needed for moderate constipation.   sertraline  25 MG tablet Commonly known as: ZOLOFT  Take 75 mg  by mouth daily.   spironolactone  25 MG tablet Commonly known as: ALDACTONE  Take 1 tablet (25 mg total) by mouth daily.   tamsulosin  0.4 MG Caps capsule Commonly known as: FLOMAX  Take 1 capsule (0.4 mg total) by mouth daily after breakfast.   torsemide  20 MG tablet Commonly known as: DEMADEX  Take 1 tablet (20 mg total) by mouth daily.       Disposition: SNF Diet recommendation: Cardiac diet  Discharge Exam: Vitals:   10/02/23 2042 10/02/23 2305 10/03/23 0520 10/03/23 0750  BP: 126/72 125/75 125/71 139/75  Pulse: 80 81 77 79  Resp: 18 15 19    Temp: 98 F (36.7 C) 97.6 F (36.4 C) 98 F (36.7 C) 98.6 F (37 C)  TempSrc: Oral Oral Oral Axillary  SpO2: 95% 93% 96% 90%  Weight:      Height:       General: Appear in mild distress; no visible Abnormal Neck Mass Or lumps, Conjunctiva normal Cardiovascular: S1 and S2 Present, no Murmur, Respiratory: good  respiratory effort, Bilateral Air entry present and bilateral  Crackles, no wheezes Abdomen: Bowel Sound present, Non tender  Extremities: no Pedal edema Neurology: alert and oriented to time, place, and person  Filed Weights   09/27/23 1708 10/01/23 0504  Weight: 80.3 kg 74.8 kg   Condition at discharge: stable  The results of significant diagnostics from this hospitalization (including imaging, microbiology, ancillary and laboratory) are listed below for reference.   Imaging Studies: US  RENAL Result Date: 10/02/2023 CLINICAL DATA:  [id number] Hydronephrosis, left [id number] EXAM: RENAL / URINARY TRACT ULTRASOUND COMPLETE COMPARISON:  November 10, 2020 FINDINGS: Right Kidney: Renal measurements: 10.2 x 5.3 x 4.7 cm = volume: 134 mL. Normal echogenicity. No mass. No hydronephrosis or nephrolithiasis. Left Kidney: Renal measurements: 10.8 x 3.8 x 6.1 cm = volume: 151.5 mL. Normal echogenicity. No mass. Mild hydronephrosis. No nephrolithiasis. Bladder: Distended bladder with irregular wall thickening and trabeculation. Echogenic debris  layering in the bladder lumen. Other: None. IMPRESSION: 1. Mild left-sided hydronephrosis. 2. Distended bladder with irregular wall thickening and trabeculation, possibly due to either chronic bladder outlet obstruction or neurogenic bladder. Echogenic debris layering in the bladder lumen, may represent blood products or infectious debris. Correlation with urinalysis recommended. Electronically Signed   By: Rance Burrows M.D.   On: 10/02/2023 22:41   CT Angio Chest Pulmonary Embolism (PE) W or WO Contrast Result Date: 10/02/2023 CLINICAL DATA:  Pulmonary embolism EXAM: CT ANGIOGRAPHY CHEST WITH CONTRAST TECHNIQUE: Multidetector CT imaging of the chest was performed using the standard protocol during bolus administration of intravenous contrast. Multiplanar CT image reconstructions and MIPs were obtained to evaluate the vascular anatomy. RADIATION DOSE REDUCTION: This exam was performed according to the departmental dose-optimization program which includes automated exposure control, adjustment of the mA and/or kV according to patient size and/or use of iterative reconstruction technique. CONTRAST:  75mL OMNIPAQUE  IOHEXOL  350 MG/ML SOLN COMPARISON:  None Available. FINDINGS: Cardiovascular: Satisfactory opacification of the pulmonary arteries to the segmental level. No evidence of pulmonary embolism. Normal heart size. No pericardial effusion. Coronary artery calcifications Mediastinum/Nodes: No enlarged mediastinal, hilar, or axillary lymph nodes. Thyroid gland, trachea, and esophagus demonstrate no significant findings. Lungs/Pleura: Ill-defined infiltrates and consolidative changes of the left upper lobe ill-defined partially consolidating infiltrates and atelectasis of the right upper lobe. Chronic elevation of the right hemidiaphragm. 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 IMPRESSION: *No evidence of pulmonary embolism. *Ill-defined infiltrates and consolidative changes of the left upper lobe and ill-defined partially consolidating infiltrates and atelectasis of the right upper lobe. *Left hydronephrosis, correlate with CT abdomen as clinically needed. 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 Electronically Signed   By: Fredrich Jefferson M.D.   On: 10/02/2023 13:24   DG CHEST PORT 1 VIEW Result Date: 10/01/2023 CLINICAL DATA:  Leukocytosis. EXAM: PORTABLE CHEST 1 VIEW COMPARISON:  Chest radiograph dated 09/06/2023 FINDINGS: Stable eventration of the right hemidiaphragm. There is mild cardiomegaly and mild vascular congestion. No new consolidation, pleural effusion or pneumothorax. Atherosclerotic calcification of the aorta. No acute osseous pathology. IMPRESSION: Mild cardiomegaly and mild vascular congestion. No new consolidation. Electronically Signed   By: Angus Bark M.D.   On: 10/01/2023 14:08   DG Chest 2 View Result Date: 09/06/2023 CLINICAL DATA:  Fever.  Hemoptysis.  COVID-19. EXAM: CHEST - 2 VIEW COMPARISON:  Chest  radiograph dated 05/15/2011. FINDINGS: Mild cardiomegaly with mild central vascular congestion. Elevation of the right hemidiaphragm. There are bibasilar atelectasis. Pneumonia is not excluded. No large pleural effusion. No pneumothorax. Atherosclerotic calcification of the aorta. No acute osseous pathology. IMPRESSION: 1. Mild cardiomegaly with mild central vascular congestion. 2. Bibasilar atelectasis. Pneumonia is not excluded. Electronically Signed   By: Angus Bark M.D.   On: 09/06/2023 14:01   ECHO TEE Result Date: 09/05/2023    TRANSESOPHOGEAL ECHO REPORT   Patient Name:   Jarquez LORIE KUTZER Date of Exam: 09/04/2023 Medical Rec #:  [id number]     Height:       72.0 in Accession #:    [id number]    Weight:       184.3 lb Date of Birth:   [birth year]     BSA:          2.058 m Patient Age:    77 years      BP:           110/71 mmHg Patient Gender: M             HR:           90 bpm. Exam Location:  Inpatient Procedure: 2D Echo, Color Doppler and Cardiac Doppler (Both Spectral and Color            Flow Doppler were utilized during procedure). Indications:     Cardioversion  History:         Patient has prior history of Echocardiogram examinations, most                  recent 08/31/2023.  Sonographer:     Hersey Lorenzo RDCS Referring Phys:  [id number] [place] LEE Diagnosing Phys: Archer Bear PROCEDURE: The transesophogeal probe was passed without difficulty through the esophogus of the patient. Sedation performed by different physician. The patient developed no complications during the procedure.  IMPRESSIONS  1. Left ventricular ejection fraction, by estimation, is 30%. The left ventricle has moderate to severely decreased function. The left ventricle demonstrates regional wall motion abnormalities with the function of basilar LV segments relatively preserved and mid to apical segments more hypokinetic.  2. Right ventricular systolic function is mildly reduced. The right ventricular size is normal.  3. Left atrial size was moderately dilated. No left atrial/left atrial appendage thrombus was detected.  4. No PFO or ASD by color doppler.  5. There was prolapse of the posterior leaflet without flail segment. The mitral valve is abnormal. Mild mitral valve regurgitation. No evidence of mitral stenosis.  6. The aortic valve is tricuspid. Aortic valve regurgitation is moderate. No aortic stenosis is present. FINDINGS  Left Ventricle: Left ventricular ejection fraction, by estimation, is 30%. The left ventricle has moderate to severely decreased function. The left ventricle demonstrates regional wall motion abnormalities. The left ventricular internal cavity size was normal in size. Right Ventricle: The right ventricular size is normal. No increase in right  ventricular wall thickness. Right ventricular systolic function is mildly reduced. Left Atrium: Left atrial size was moderately dilated. No left atrial/left atrial appendage thrombus was detected. Right Atrium: Right atrial size was normal in size. Pericardium: There is no evidence of pericardial effusion. 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 The mitral valve is abnormal. Mild mitral valve regurgitation. No evidence of mitral valve stenosis. Tricuspid Valve: The tricuspid valve is normal in structure. Tricuspid valve regurgitation is mild. Aortic Valve: The aortic valve is tricuspid. Aortic  valve regurgitation is moderate. No aortic stenosis is present. Pulmonic Valve: The pulmonic valve was normal in structure. Pulmonic valve regurgitation is trivial. Aorta: The aortic root is normal in size and structure. IAS/Shunts: No PFO or ASD by color doppler. Dalton McleanMD Electronically signed by Archer Bear Signature Date/Time: 09/05/2023/3:35:45 PM    Final    EP STUDY Result Date: 09/04/2023 See surgical note for result.   Microbiology: Results for orders placed or performed during the hospital encounter of 08/28/23  Blood Culture (routine x 2)     Status: None   Collection Time: 08/28/23  4:15 PM   Specimen: BLOOD  Result Value Ref Range Status   Specimen Description   Final    BLOOD LEFT ANTECUBITAL Performed at [facility], [address] [place], [place] [address]    Special Requests   Final    BOTTLES DRAWN AEROBIC AND ANAEROBIC Blood Culture results may not be optimal due to an inadequate volume of blood received in culture bottles Performed at [facility], [address] [place], [place] [address]    Culture   Final    NO GROWTH 5 DAYS Performed at [facility], [address], [place], [place] [address]    Report Status 09/02/2023 FINAL  Final  Blood Culture (routine x 2)     Status: None    Collection Time: 08/28/23  4:18 PM   Specimen: BLOOD LEFT WRIST  Result Value Ref Range Status   Specimen Description   Final    BLOOD LEFT WRIST Performed at [facility], [address], [place], [place] [address]    Special Requests   Final    BOTTLES DRAWN AEROBIC AND ANAEROBIC Blood Culture results may not be optimal due to an inadequate volume of blood received in culture bottles Performed at [facility], [address] [place], [place] [address]    Culture   Final    NO GROWTH 5 DAYS Performed at [facility], [address], [place], [place] [address]    Report Status 09/02/2023 FINAL  Final  Resp panel by RT-PCR (RSV, Flu A&B, Covid) Anterior Nasal Swab     Status: Abnormal   Collection Time: 08/28/23  5:04 PM   Specimen: Anterior Nasal Swab  Result Value Ref Range Status   SARS Coronavirus 2 by RT PCR POSITIVE (A) NEGATIVE Final    Comment: (NOTE) SARS-CoV-2 target nucleic acids are DETECTED.  The SARS-CoV-2 RNA is generally detectable in upper respiratory specimens during the acute phase of infection. Positive results are indicative of the presence of the identified virus, but do not rule out bacterial infection or co-infection with other pathogens not detected by the test. Clinical correlation with patient history and other diagnostic information is necessary to determine patient infection status. The expected result is Negative.  Fact Sheet for Patients: BloggerCourse.com  Fact Sheet for Healthcare Providers: SeriousBroker.it  This test is not yet approved or cleared by the [place]  FDA and  has been authorized for detection and/or diagnosis of SARS-CoV-2 by FDA under an Emergency Use Authorization (EUA).  This EUA will remain in effect (meaning this test can be used) for the duration of  the COVID-19 declaration under Section 564(b)(1) of the A ct, 21 U.S.C. section  360bbb-3(b)(1), unless the authorization is terminated or revoked sooner.     Influenza A by PCR NEGATIVE NEGATIVE Final   Influenza B by PCR NEGATIVE NEGATIVE Final  Comment: (NOTE) The Xpert Xpress SARS-CoV-2/FLU/RSV plus assay is intended as an aid in the diagnosis of influenza from Nasopharyngeal swab specimens and should not be used as a sole basis for treatment. Nasal washings and aspirates are unacceptable for Xpert Xpress SARS-CoV-2/FLU/RSV testing.  Fact Sheet for Patients: BloggerCourse.com  Fact Sheet for Healthcare Providers: SeriousBroker.it  This test is not yet approved or cleared by the [place]  FDA and has been authorized for detection and/or diagnosis of SARS-CoV-2 by FDA under an Emergency Use Authorization (EUA). This EUA will remain in effect (meaning this test can be used) for the duration of the COVID-19 declaration under Section 564(b)(1) of the Act, 21 U.S.C. section 360bbb-3(b)(1), unless the authorization is terminated or revoked.     Resp Syncytial Virus by PCR NEGATIVE NEGATIVE Final    Comment: (NOTE) Fact Sheet for Patients: BloggerCourse.com  Fact Sheet for Healthcare Providers: SeriousBroker.it  This test is not yet approved or cleared by the [place]  FDA and has been authorized for detection and/or diagnosis of SARS-CoV-2 by FDA under an Emergency Use Authorization (EUA). This EUA will remain in effect (meaning this test can be used) for the duration of the COVID-19 declaration under Section 564(b)(1) of the Act, 21 U.S.C. section 360bbb-3(b)(1), unless the authorization is terminated or revoked.  Performed at [facility], [address], [place], [place] [address]   MRSA Next Gen by PCR, Nasal     Status: None   Collection Time: 08/31/23  9:20 AM   Specimen: Nasal Mucosa; Nasal Swab  Result Value Ref  Range Status   MRSA by PCR Next Gen NOT DETECTED NOT DETECTED Final    Comment: (NOTE) The GeneXpert MRSA Assay (FDA approved for NASAL specimens only), is one component of a comprehensive MRSA colonization surveillance program. It is not intended to diagnose MRSA infection nor to guide or monitor treatment for MRSA infections. Test performance is not FDA approved in patients less than 28 years old. Performed at [facility], [address], [place], [place] [address]    Labs: CBC: Recent Labs  Lab 09/27/23 1007 09/27/23 1450 09/28/23 1304 09/28/23 1733 09/30/23 0413 09/30/23 1154 10/01/23 0843 10/02/23 0601 10/03/23 1007  WBC 8.8   < > 8.2  --  9.5  --  15.0* 15.8* 18.3*  NEUTROABS 6.7  --   --   --   --   --   --   --   --   HGB 11.0*   < > 10.4*   < > 10.5* 10.5* 10.0* 9.7* 9.9*  HCT 32.9*   < > 31.2*   < > 31.9* 31.0* 31.1* 30.1* 30.0*  MCV 92.4   < > 92.6  --  93.0  --  94.8 93.5 92.0  PLT 335   < > 340  --  371  --  365 299 250   < > = values in this interval not displayed.   Basic Metabolic Panel: Recent Labs  Lab 09/29/23 1007 09/29/23 1054 09/29/23 1420 09/29/23 1442 09/30/23 0413 09/30/23 1154 10/01/23 0843 10/02/23 0601 10/03/23 1007  NA  --    < > 141   < > 146* 145 140 135 133*  K  --    < > 3.2*   < > 2.7* 3.7 2.8* 3.3* 3.5  CL  --    < > 99   < > 104 108 103 101 97*  CO2  --    < > 25  --  27  --  27 25 26   GLUCOSE  --    < > 109*   < > 133* 110* 109* 93 99  BUN  --    < > 19   < > 17 22 13 13 17   CREATININE  --    < > 1.75*   < > 1.65* 1.40* 1.43* 1.16 1.35*  CALCIUM  --    < > 8.9  --  9.1  --  9.0 8.7* 8.4*  MG 2.3  --   --   --   --   --   --  2.0  --    < > = values in this interval not displayed.   Liver Function Tests: Recent Labs  Lab 09/27/23 1007 09/28/23 0435  AST 24 17  ALT 21 16  ALKPHOS 64 56  BILITOT 0.8 1.0  PROT 6.2* 5.7*  ALBUMIN  3.3* 3.0*   CBG: No results for input(s): "GLUCAP" in the last 168  hours.  Discharge time spent: greater than 30 minutes.  Author: Charlean Congress, MD  [facility]

## 2023-10-03 NOTE — Progress Notes (Signed)
 Pt discharged to [facility]. PTAR picked up at 1441. [facility] x2 for report, unable to reach out receiving RN.

## 2023-10-03 NOTE — Care Management Important Message (Signed)
 Important Message  Patient Details  Name: Brian Ruiz MRN: [id number] Date of Birth: [birth year]   Important Message Given:  Yes - Medicare IM     Janith Melnick 10/03/2023, 10:38 AM

## 2023-10-04 ENCOUNTER — Encounter: Payer: Self-pay | Admitting: Internal Medicine

## 2023-10-05 ENCOUNTER — Other Ambulatory Visit: Payer: Self-pay

## 2023-10-05 ENCOUNTER — Inpatient Hospital Stay (HOSPITAL_COMMUNITY)
Admission: EM | Admit: 2023-10-05 | Discharge: 2023-10-29 | DRG: 871 | Disposition: E | Source: Skilled Nursing Facility | Attending: Internal Medicine | Admitting: Internal Medicine

## 2023-10-05 ENCOUNTER — Emergency Department (HOSPITAL_COMMUNITY)

## 2023-10-05 DIAGNOSIS — R6520 Severe sepsis without septic shock: Secondary | ICD-10-CM | POA: Diagnosis present

## 2023-10-05 DIAGNOSIS — B3749 Other urogenital candidiasis: Secondary | ICD-10-CM | POA: Diagnosis present

## 2023-10-05 DIAGNOSIS — F418 Other specified anxiety disorders: Secondary | ICD-10-CM | POA: Diagnosis present

## 2023-10-05 DIAGNOSIS — E861 Hypovolemia: Secondary | ICD-10-CM | POA: Diagnosis present

## 2023-10-05 DIAGNOSIS — Y95 Nosocomial condition: Secondary | ICD-10-CM | POA: Diagnosis present

## 2023-10-05 DIAGNOSIS — N401 Enlarged prostate with lower urinary tract symptoms: Secondary | ICD-10-CM | POA: Diagnosis present

## 2023-10-05 DIAGNOSIS — Z66 Do not resuscitate: Secondary | ICD-10-CM | POA: Diagnosis present

## 2023-10-05 DIAGNOSIS — F32A Depression, unspecified: Secondary | ICD-10-CM | POA: Diagnosis present

## 2023-10-05 DIAGNOSIS — R7989 Other specified abnormal findings of blood chemistry: Secondary | ICD-10-CM | POA: Diagnosis present

## 2023-10-05 DIAGNOSIS — E871 Hypo-osmolality and hyponatremia: Secondary | ICD-10-CM | POA: Diagnosis present

## 2023-10-05 DIAGNOSIS — I5022 Chronic systolic (congestive) heart failure: Secondary | ICD-10-CM | POA: Diagnosis present

## 2023-10-05 DIAGNOSIS — A419 Sepsis, unspecified organism: Principal | ICD-10-CM | POA: Diagnosis present

## 2023-10-05 DIAGNOSIS — J189 Pneumonia, unspecified organism: Principal | ICD-10-CM | POA: Diagnosis present

## 2023-10-05 DIAGNOSIS — N179 Acute kidney failure, unspecified: Secondary | ICD-10-CM | POA: Diagnosis present

## 2023-10-05 DIAGNOSIS — R64 Cachexia: Secondary | ICD-10-CM | POA: Diagnosis present

## 2023-10-05 DIAGNOSIS — Z9049 Acquired absence of other specified parts of digestive tract: Secondary | ICD-10-CM

## 2023-10-05 DIAGNOSIS — Z515 Encounter for palliative care: Secondary | ICD-10-CM | POA: Diagnosis not present

## 2023-10-05 DIAGNOSIS — Z8249 Family history of ischemic heart disease and other diseases of the circulatory system: Secondary | ICD-10-CM

## 2023-10-05 DIAGNOSIS — Z860100 Personal history of colon polyps, unspecified: Secondary | ICD-10-CM

## 2023-10-05 DIAGNOSIS — N1831 Chronic kidney disease, stage 3a: Secondary | ICD-10-CM | POA: Diagnosis present

## 2023-10-05 DIAGNOSIS — I5023 Acute on chronic systolic (congestive) heart failure: Secondary | ICD-10-CM | POA: Diagnosis present

## 2023-10-05 DIAGNOSIS — R69 Illness, unspecified: Secondary | ICD-10-CM

## 2023-10-05 DIAGNOSIS — Z7189 Other specified counseling: Secondary | ICD-10-CM | POA: Diagnosis not present

## 2023-10-05 DIAGNOSIS — F1120 Opioid dependence, uncomplicated: Secondary | ICD-10-CM | POA: Diagnosis present

## 2023-10-05 DIAGNOSIS — F0394 Unspecified dementia, unspecified severity, with anxiety: Secondary | ICD-10-CM | POA: Diagnosis present

## 2023-10-05 DIAGNOSIS — I2489 Other forms of acute ischemic heart disease: Secondary | ICD-10-CM | POA: Diagnosis present

## 2023-10-05 DIAGNOSIS — N1330 Unspecified hydronephrosis: Secondary | ICD-10-CM | POA: Diagnosis present

## 2023-10-05 DIAGNOSIS — E872 Acidosis, unspecified: Secondary | ICD-10-CM | POA: Diagnosis present

## 2023-10-05 DIAGNOSIS — I130 Hypertensive heart and chronic kidney disease with heart failure and stage 1 through stage 4 chronic kidney disease, or unspecified chronic kidney disease: Secondary | ICD-10-CM | POA: Diagnosis present

## 2023-10-05 DIAGNOSIS — N400 Enlarged prostate without lower urinary tract symptoms: Secondary | ICD-10-CM | POA: Diagnosis present

## 2023-10-05 DIAGNOSIS — J9601 Acute respiratory failure with hypoxia: Secondary | ICD-10-CM | POA: Diagnosis not present

## 2023-10-05 DIAGNOSIS — Z7901 Long term (current) use of anticoagulants: Secondary | ICD-10-CM

## 2023-10-05 DIAGNOSIS — Z888 Allergy status to other drugs, medicaments and biological substances status: Secondary | ICD-10-CM

## 2023-10-05 DIAGNOSIS — I480 Paroxysmal atrial fibrillation: Secondary | ICD-10-CM | POA: Diagnosis present

## 2023-10-05 DIAGNOSIS — Z6822 Body mass index (BMI) 22.0-22.9, adult: Secondary | ICD-10-CM

## 2023-10-05 DIAGNOSIS — Z8616 Personal history of COVID-19: Secondary | ICD-10-CM

## 2023-10-05 DIAGNOSIS — Z79899 Other long term (current) drug therapy: Secondary | ICD-10-CM

## 2023-10-05 DIAGNOSIS — Z885 Allergy status to narcotic agent status: Secondary | ICD-10-CM

## 2023-10-05 DIAGNOSIS — Z1152 Encounter for screening for COVID-19: Secondary | ICD-10-CM

## 2023-10-05 DIAGNOSIS — E876 Hypokalemia: Secondary | ICD-10-CM | POA: Diagnosis present

## 2023-10-05 DIAGNOSIS — E870 Hyperosmolality and hypernatremia: Secondary | ICD-10-CM | POA: Diagnosis present

## 2023-10-05 DIAGNOSIS — F0393 Unspecified dementia, unspecified severity, with mood disturbance: Secondary | ICD-10-CM | POA: Diagnosis present

## 2023-10-05 DIAGNOSIS — G894 Chronic pain syndrome: Secondary | ICD-10-CM | POA: Diagnosis present

## 2023-10-05 DIAGNOSIS — R338 Other retention of urine: Secondary | ICD-10-CM | POA: Diagnosis present

## 2023-10-05 LAB — URINALYSIS, W/ REFLEX TO CULTURE (INFECTION SUSPECTED)
Bilirubin Urine: NEGATIVE
Glucose, UA: NEGATIVE mg/dL
Ketones, ur: NEGATIVE mg/dL
Nitrite: NEGATIVE
Protein, ur: 30 mg/dL — AB
Specific Gravity, Urine: 1.011 (ref 1.005–1.030)
WBC, UA: >50 WBC/hpf (ref 0–5)
pH: 5 (ref 5.0–8.0)

## 2023-10-05 LAB — CBC WITH DIFFERENTIAL/PLATELET
Abs Immature Granulocytes: 0.47 10*3/uL — ABNORMAL HIGH (ref 0.00–0.07)
Basophils Absolute: 0.1 10*3/uL (ref 0.0–0.1)
Basophils Relative: 0 %
Eosinophils Absolute: 0 10*3/uL (ref 0.0–0.5)
Eosinophils Relative: 0 %
HCT: 34.9 % — ABNORMAL LOW (ref 39.0–52.0)
Hemoglobin: 11.1 g/dL — ABNORMAL LOW (ref 13.0–17.0)
Immature Granulocytes: 2 %
Lymphocytes Relative: 3 %
Lymphs Abs: 0.7 10*3/uL (ref 0.7–4.0)
MCH: 29.9 pg (ref 26.0–34.0)
MCHC: 31.8 g/dL (ref 30.0–36.0)
MCV: 94.1 fL (ref 80.0–100.0)
Monocytes Absolute: 1.3 10*3/uL — ABNORMAL HIGH (ref 0.1–1.0)
Monocytes Relative: 5 %
Neutro Abs: 23.2 10*3/uL — ABNORMAL HIGH (ref 1.7–7.7)
Neutrophils Relative %: 90 %
Platelets: 251 10*3/uL (ref 150–400)
RBC: 3.71 MIL/uL — ABNORMAL LOW (ref 4.22–5.81)
RDW: 13.7 % (ref 11.5–15.5)
WBC: 25.9 10*3/uL — ABNORMAL HIGH (ref 4.0–10.5)
nRBC: 0 % (ref 0.0–0.2)

## 2023-10-05 LAB — COMPREHENSIVE METABOLIC PANEL WITH GFR
ALT: 20 U/L (ref 0–44)
ALT: 19 U/L (ref 0–44)
AST: 28 U/L (ref 15–41)
AST: 23 U/L (ref 15–41)
Albumin: 2.6 g/dL — ABNORMAL LOW (ref 3.5–5.0)
Albumin: 2.4 g/dL — ABNORMAL LOW (ref 3.5–5.0)
Alkaline Phosphatase: 62 U/L (ref 38–126)
Alkaline Phosphatase: 52 U/L (ref 38–126)
Anion gap: 15 (ref 5–15)
Anion gap: 13 (ref 5–15)
BUN: 21 mg/dL (ref 8–23)
BUN: 23 mg/dL (ref 8–23)
CO2: 23 mmol/L (ref 22–32)
CO2: 24 mmol/L (ref 22–32)
Calcium: 8.9 mg/dL (ref 8.9–10.3)
Calcium: 8.6 mg/dL — ABNORMAL LOW (ref 8.9–10.3)
Chloride: 95 mmol/L — ABNORMAL LOW (ref 98–111)
Chloride: 99 mmol/L (ref 98–111)
Creatinine, Ser: 1.52 mg/dL — ABNORMAL HIGH (ref 0.61–1.24)
Creatinine, Ser: 1.58 mg/dL — ABNORMAL HIGH (ref 0.61–1.24)
GFR, Estimated: 47 mL/min — ABNORMAL LOW (ref >60)
GFR, Estimated: 45 mL/min — ABNORMAL LOW (ref >60)
Glucose, Bld: 114 mg/dL — ABNORMAL HIGH (ref 70–99)
Glucose, Bld: 115 mg/dL — ABNORMAL HIGH (ref 70–99)
Potassium: 3.3 mmol/L — ABNORMAL LOW (ref 3.5–5.1)
Potassium: 3 mmol/L — ABNORMAL LOW (ref 3.5–5.1)
Sodium: 133 mmol/L — ABNORMAL LOW (ref 135–145)
Sodium: 136 mmol/L (ref 135–145)
Total Bilirubin: 0.8 mg/dL (ref 0.0–1.2)
Total Bilirubin: 1 mg/dL (ref 0.0–1.2)
Total Protein: 6.2 g/dL — ABNORMAL LOW (ref 6.5–8.1)
Total Protein: 5.7 g/dL — ABNORMAL LOW (ref 6.5–8.1)

## 2023-10-05 LAB — BRAIN NATRIURETIC PEPTIDE: B Natriuretic Peptide: 575 pg/mL — ABNORMAL HIGH (ref 0.0–100.0)

## 2023-10-05 LAB — RESP PANEL BY RT-PCR (RSV, FLU A&B, COVID)  RVPGX2
Influenza A by PCR: NEGATIVE
Influenza B by PCR: NEGATIVE
Resp Syncytial Virus by PCR: NEGATIVE
SARS Coronavirus 2 by RT PCR: NEGATIVE

## 2023-10-05 LAB — CBC
HCT: 31.5 % — ABNORMAL LOW (ref 39.0–52.0)
Hemoglobin: 10.4 g/dL — ABNORMAL LOW (ref 13.0–17.0)
MCH: 30.8 pg (ref 26.0–34.0)
MCHC: 33 g/dL (ref 30.0–36.0)
MCV: 93.2 fL (ref 80.0–100.0)
Platelets: 222 10*3/uL (ref 150–400)
RBC: 3.38 MIL/uL — ABNORMAL LOW (ref 4.22–5.81)
RDW: 13.7 % (ref 11.5–15.5)
WBC: 32.4 10*3/uL — ABNORMAL HIGH (ref 4.0–10.5)
nRBC: 0 % (ref 0.0–0.2)

## 2023-10-05 LAB — TROPONIN I (HIGH SENSITIVITY)
Troponin I (High Sensitivity): 737 ng/L (ref <18)
Troponin I (High Sensitivity): 850 ng/L (ref <18)

## 2023-10-05 LAB — I-STAT CG4 LACTIC ACID, ED
Lactic Acid, Venous: 2.3 mmol/L (ref 0.5–1.9)
Lactic Acid, Venous: 1.7 mmol/L (ref 0.5–1.9)

## 2023-10-05 MED ORDER — GUAIFENESIN ER 600 MG PO TB12
600.0000 mg | ORAL_TABLET | Freq: Two times a day (BID) | ORAL | Status: DC
  Filled 2023-10-05 (×3): qty 1

## 2023-10-05 MED ORDER — HYDROMORPHONE HCL 1 MG/ML IJ SOLN
1.0000 mg | Freq: Three times a day (TID) | INTRAMUSCULAR | Status: DC
  Administered 2023-10-05 – 2023-10-10 (×13): 1 mg via INTRAVENOUS
  Filled 2023-10-05 (×13): qty 1

## 2023-10-05 MED ORDER — SODIUM CHLORIDE 0.9 % IV SOLN
2.0000 g | Freq: Two times a day (BID) | INTRAVENOUS | Status: DC
  Administered 2023-10-06 – 2023-10-10 (×9): 2 g via INTRAVENOUS
  Filled 2023-10-05 (×9): qty 12.5

## 2023-10-05 MED ORDER — AMIODARONE HCL 200 MG PO TABS
200.0000 mg | ORAL_TABLET | Freq: Every day | ORAL | Status: DC
  Administered 2023-10-06 – 2023-10-07 (×2): 200 mg via ORAL
  Filled 2023-10-05 (×3): qty 1

## 2023-10-05 MED ORDER — VANCOMYCIN HCL IN DEXTROSE 1-5 GM/200ML-% IV SOLN
1000.0000 mg | INTRAVENOUS | Status: DC
Start: 2023-10-06 — End: 2023-10-06

## 2023-10-05 MED ORDER — HYDROMORPHONE HCL 2 MG PO TABS
4.0000 mg | ORAL_TABLET | Freq: Three times a day (TID) | ORAL | Status: DC
  Administered 2023-10-07: 4 mg via ORAL
  Filled 2023-10-05 (×2): qty 2

## 2023-10-05 MED ORDER — POTASSIUM CHLORIDE 20 MEQ PO PACK: 40.0000 meq | PACK | Freq: Once | ORAL | Status: DC

## 2023-10-05 MED ORDER — LACTATED RINGERS IV SOLN: INTRAVENOUS | Status: AC

## 2023-10-05 MED ORDER — SODIUM CHLORIDE 0.9 % IV SOLN
2.0000 g | Freq: Once | INTRAVENOUS | Status: AC
  Administered 2023-10-05: 2 g via INTRAVENOUS
  Filled 2023-10-05: qty 12.5

## 2023-10-05 MED ORDER — BISACODYL 5 MG PO TBEC: 5.0000 mg | DELAYED_RELEASE_TABLET | Freq: Every day | ORAL | Status: DC | PRN

## 2023-10-05 MED ORDER — TAMSULOSIN HCL 0.4 MG PO CAPS
0.4000 mg | ORAL_CAPSULE | Freq: Every day | ORAL | Status: DC
  Administered 2023-10-06 – 2023-10-07 (×2): 0.4 mg via ORAL
  Filled 2023-10-05 (×4): qty 1

## 2023-10-05 MED ORDER — VANCOMYCIN HCL 1500 MG/300ML IV SOLN
1500.0000 mg | Freq: Once | INTRAVENOUS | Status: AC
  Administered 2023-10-05: 1500 mg via INTRAVENOUS
  Filled 2023-10-05: qty 300

## 2023-10-05 MED ORDER — METRONIDAZOLE 500 MG/100ML IV SOLN
500.0000 mg | Freq: Two times a day (BID) | INTRAVENOUS | Status: DC
  Administered 2023-10-05 – 2023-10-10 (×10): 500 mg via INTRAVENOUS
  Filled 2023-10-05 (×10): qty 100

## 2023-10-05 MED ORDER — ONDANSETRON HCL 4 MG PO TABS: 4.0000 mg | ORAL_TABLET | Freq: Four times a day (QID) | ORAL | Status: DC | PRN

## 2023-10-05 MED ORDER — ACETAMINOPHEN 650 MG RE SUPP: 650.0000 mg | Freq: Four times a day (QID) | RECTAL | Status: DC | PRN

## 2023-10-05 MED ORDER — POLYETHYLENE GLYCOL 3350 17 G PO PACK
17.0000 g | PACK | Freq: Every day | ORAL | Status: DC
  Administered 2023-10-06 – 2023-10-07 (×2): 17 g via ORAL
  Filled 2023-10-05 (×3): qty 1

## 2023-10-05 MED ORDER — SENNOSIDES-DOCUSATE SODIUM 8.6-50 MG PO TABS: 1.0000 | ORAL_TABLET | Freq: Every evening | ORAL | Status: DC | PRN

## 2023-10-05 MED ORDER — LEVALBUTEROL HCL 0.63 MG/3ML IN NEBU: 0.6300 mg | INHALATION_SOLUTION | Freq: Four times a day (QID) | RESPIRATORY_TRACT | Status: DC | PRN

## 2023-10-05 MED ORDER — VANCOMYCIN HCL IN DEXTROSE 1-5 GM/200ML-% IV SOLN: 1000.0000 mg | Freq: Once | INTRAVENOUS | Status: DC

## 2023-10-05 MED ORDER — ACETAMINOPHEN 325 MG PO TABS: 650.0000 mg | ORAL_TABLET | Freq: Four times a day (QID) | ORAL | Status: DC | PRN

## 2023-10-05 MED ORDER — ONDANSETRON HCL 4 MG/2ML IJ SOLN: 4.0000 mg | Freq: Four times a day (QID) | INTRAMUSCULAR | Status: DC | PRN

## 2023-10-05 MED ORDER — SODIUM CHLORIDE 0.9% FLUSH
3.0000 mL | Freq: Two times a day (BID) | INTRAVENOUS | Status: DC
  Administered 2023-10-06 – 2023-10-09 (×8): 3 mL via INTRAVENOUS

## 2023-10-05 MED ORDER — SERTRALINE HCL 25 MG PO TABS
25.0000 mg | ORAL_TABLET | Freq: Three times a day (TID) | ORAL | Status: DC
  Administered 2023-10-06 – 2023-10-07 (×4): 25 mg via ORAL
  Filled 2023-10-05 (×6): qty 1

## 2023-10-05 NOTE — ED Triage Notes (Signed)
 Ems from [facility] for sob x hours - 1430 post PT With diagnosis of chf. Ems placed on cpap upon arrival  Lungs bilateral crackles, now more clear due to cpap administration  Vital Signs with EMS 55 %RA, once on bipap 83 A&Ox4 with ems  140/80, 187/110 100 ST  100.0 Etco2 23 40 RR Hx: Needs home o2, due to CHF recent diagnosis  Recent covid hospital stay GI bleed

## 2023-10-05 NOTE — ED Notes (Signed)
 ED Disposition     ED Disposition  Admit   Condition  --   Comment  Hospital Area: [facility] [[id number]]  Level of Care: Progressive [102]  Admit to Progressive based on following criteria: RESPIRATORY PROBLEMS hypoxemic/hypercapnic respiratory failure that is responsive to NIPPV (BiPAP) or High Flow Nasal Cannula (6-80 lpm). Frequent assessment/intervention, no > Q2 hrs < Q4 hrs, to maintain oxygenation and pulmonary hygiene.  May admit patient to [facility] or [facility] if equivalent level of care is available:: No  Covid Evaluation: Symptomatic Person Under Investigation (PUI) or recent exposure (last 10 days) *Testing Required*  Diagnosis: Severe sepsis [facility]) [[id number]]  Admitting Physician: Kenny Peals [[id number]]  Attending Physician: Kenny Peals [[id number]]  Certification:: I certify this patient will need inpatient services for at least 2 midnights  Expected Medical Readiness: 10/09/2023

## 2023-10-05 NOTE — Hospital Course (Signed)
 Brian Ruiz is a 78 y.o. male with medical history significant for HFrEF (EF 30%), PAF on Eliquis  (recently on hold for rectal bleeding), CKD stage IIIa with left hydronephrosis, HTN, BPH, depression/anxiety, chronic pain with narcotic dependence who is admitted with severe sepsis and acute hypoxic respiratory failure.

## 2023-10-05 NOTE — Progress Notes (Signed)
 Pt transported via Bipap, RT and RN from ED- 5W w/o complications. VSS throughout.

## 2023-10-05 NOTE — H&P (Addendum)
 History and Physical    Brian Ruiz QIO:[id number] DOB: Oct 16, 1945 DOA: 10/05/2023  PCP: Chanetta Comes, [facility]  Patient coming from: SNF  I have personally briefly reviewed patient's old medical records in [facility] Link  Chief Complaint: Dyspnea, hypoxia  HPI: Brian Ruiz is a 78 y.o. male with medical history significant for HFrEF (EF 30%), PAF on Eliquis  (recently on hold for rectal bleeding), CKD stage IIIa with left hydronephrosis, HTN, BPH, depression/anxiety, chronic pain with narcotic dependence who presented to ED from SNF for evaluation of shortness of breath.  Patient with 2 recent admissions: 3/31 >> 4/11: Initially admitted with acute hypoxic respiratory failure due to COVID-19 pneumonia.  Subsequently developed A-fib with RVR then low output cardiogenic shock requiring milrinone .  Found to have EF 20-25%.  LHC with no obstructive CAD, RHC with normal filling pressures.  Suspected Takotsubo cardiomyopathy.  He was discharged to SNF on Eliquis , amiodarone , and 2 L O2 via [place].  4/30 >> 5/6: Admitted with BRBPR and hematochezia from rectal ulceration seen on colonoscopy.  Felt secondary to stercoral ulceration.  Biopsy negative for significant pathology.  His Eliquis  was held with plan to resume on 5/10.  During admission there was also concern for aspiration pneumonia versus pneumonitis.  Seen by SLP and passed swallow eval.  He was discharged to [facility] on 1 week Augmentin.  Patient states he has not seen any further rectal bleeding since discharge.  He has been increasingly short of breath over the last 2 days.  He reports associated cough occasionally productive of yellow to dark brown sputum.  He denies chest pain, nausea, vomiting.  He reports good urine output without dysuria.  He says he is having regular bowel movements without diarrhea.  He denies abdominal pain.  He has not seen any increased swelling in his extremities.  ED Course  Labs/Imaging on admission:  I have personally reviewed following labs and imaging studies.  Initial vitals showed BP 126/88, pulse 94, RR 18, temp 102.6 F rectally, SpO2 100% while on BiPAP.  Labs showed WBC 25.9, hemoglobin 11.1, platelets 251, sodium 133, potassium 3.3, bicarb 23, BUN 21, creatinine 1.52, serum Kos 114, LFTs within normal limits, troponin 737, lactic acid 2.3 > 1.7.  BNP 575.0.  Blood cultures ordered (1 set collected, second set pending collection).  UA pending collection.  Portable chest x-ray showed similar patchy opacities in the left lung, bibasilar atelectasis, gas distended loops of bowel in the RUQ.  Patient was given IV vancomycin and cefepime .  Patient failed trial off BiPAP.  The hospitalist service was consulted to admit.  Review of Systems: All systems reviewed and are negative except as documented in history of present illness above.   Past Medical History:  Diagnosis Date   Arthritis    Back pain, chronic    intolerant to narcotics, avoids NSAIDs due to vioxx related bleed,  sees pain clinic as [place], prior eval by [facility] neuro in [place]   Cholecystitis    Choledocholithiasis    Chronic diastolic CHF (congestive heart failure) (HCC) 05/16/2018   Per Dr Letta Raw note in 2019, pt did not have heart failure but hypertensive heart disease.  Azzie Bollman FNP-C   Encephalopathy acute 05/15/2011   Heart murmur    Hypertension    Inguinal hernia    Polyneuropathy    S/P thyroidectomy 1967   Scoliosis    Urinary retention     Past Surgical History:  Procedure Laterality Date   BACK SURGERY  1975   [id number]   BONE BIOPSY  09/30/2023   Procedure: BIOPSY, GI;  Surgeon: Nannette Babe, MD;  Location: [facility] ENDOSCOPY;  Service: Gastroenterology;;   CARDIOVERSION N/A 09/04/2023   Procedure: CARDIOVERSION;  Surgeon: Darlis Eisenmenger, MD;  Location: [facility] INVASIVE CV LAB;  Service: Cardiovascular;  Laterality: N/A;   CHOLECYSTECTOMY N/A 11/13/2020   Procedure: LAPAROSCOPIC CHOLECYSTECTOMY WITH  INTRAOPERATIVE CHOLANGIOGRAM;  Surgeon: Dorena Gander, MD;  Location: [facility] OR;  Service: General;  Laterality: N/A;   COLONOSCOPY     COLONOSCOPY N/A 09/29/2023   Procedure: COLONOSCOPY;  Surgeon: Nannette Babe, MD;  Location: [facility] ENDOSCOPY;  Service: Gastroenterology;  Laterality: N/A;   COLONOSCOPY N/A 09/30/2023   Procedure: COLONOSCOPY;  Surgeon: Nannette Babe, MD;  Location: [facility] ENDOSCOPY;  Service: Gastroenterology;  Laterality: N/A;   FOOT SURGERY  1985, 2000   due to arthritis   HAND SURGERY  2005   left - due to arthritis   INGUINAL HERNIA REPAIR  04/30/2012   Procedure: HERNIA REPAIR INGUINAL ADULT;  Surgeon: Darcella Earnest, MD;  Location: [facility];  Service: General;  Laterality: Left;  repair left inguinal hernia   POLYPECTOMY  09/30/2023   Procedure: POLYPECTOMY, INTESTINE;  Surgeon: Nannette Babe, MD;  Location: [facility] ENDOSCOPY;  Service: Gastroenterology;;   RIGHT/LEFT HEART CATH AND CORONARY ANGIOGRAPHY N/A 09/01/2023   Procedure: RIGHT/LEFT HEART CATH AND CORONARY ANGIOGRAPHY;  Surgeon: Lauralee Poll, MD;  Location: [facility] INVASIVE CV LAB;  Service: Cardiovascular;  Laterality: N/A;   THYROIDECTOMY  1967   TONSILLECTOMY     TRANSESOPHAGEAL ECHOCARDIOGRAM (CATH LAB) N/A 09/04/2023   Procedure: TRANSESOPHAGEAL ECHOCARDIOGRAM;  Surgeon: Darlis Eisenmenger, MD;  Location: [facility] INVASIVE CV LAB;  Service: Cardiovascular;  Laterality: N/A;    Social History: Social History   Tobacco Use   Smoking status: Never    Passive exposure: Never   Smokeless tobacco: Never  Vaping Use   Vaping status: Never Used  Substance Use Topics   Alcohol use: No   Drug use: No   Allergies  Allergen Reactions   Nsaids Shortness Of Breath and Other (See Comments)    Bronchospasm    Roxicodone  [Oxycodone ] Other (See Comments)    Psychomotor agitation   Tolectin [Tolmetin] Shortness Of Breath   Ultram [Tramadol] Anaphylaxis   Actiq  [Fentanyl ] Other (See Comments)    Unknown reaction    Codeine Anxiety and Other (See Comments)    Bronchospasm Irritability     Family History  Problem Relation Age of Onset   Heart disease Father      Prior to Admission medications   Medication Sig Start Date End Date Taking? Authorizing Provider  acetaminophen  (TYLENOL ) 500 MG tablet Take 1,000 mg by mouth every 8 (eight) hours as needed for mild pain (pain score 1-3) or moderate pain (pain score 4-6). For pain.    [provider]  amiodarone  (PACERONE ) 200 MG tablet Take 200 mg by mouth daily.    [provider]  amoxicillin-clavulanate (AUGMENTIN) 875-125 MG tablet Take 1 tablet by mouth 2 (two) times daily for 7 days. 10/03/23 10/21/2023  Kraig Peru, MD  apixaban  (ELIQUIS ) 5 MG TABS tablet Take 1 tablet (5 mg total) by mouth 2 (two) times daily. 10/07/23   Kraig Peru, MD  Artificial Tear Ointment (DRY EYES OP) Apply 1 drop to eye daily as needed (dry eyes). Patient not taking: Reported on 09/27/2023    [provider]  benzonatate (TESSALON) 100 MG  capsule Take 1 capsule (100 mg total) by mouth 3 (three) times daily. 10/03/23   Kraig Peru, MD  dapagliflozin  propanediol (FARXIGA ) 10 MG TABS tablet Take 10 mg by mouth daily. Patient not taking: Reported on 09/27/2023    [provider]  dextromethorphan (DELSYM) 30 MG/5ML liquid Take 2.5 mLs (15 mg total) by mouth 2 (two) times daily. 10/03/23   Kraig Peru, MD  gabapentin  (NEURONTIN ) 400 MG capsule Take 400 mg by mouth at bedtime as needed (back pain).    [provider]  guaiFENesin  (MUCINEX ) 600 MG 12 hr tablet Take 2 tablets (1,200 mg total) by mouth 2 (two) times daily for 7 days. 10/03/23 10/17/2023  Kraig Peru, MD  HYDROmorphone  (DILAUDID ) 4 MG tablet Take 1 tablet (4 mg total) by mouth every 8 (eight) hours. 10/03/23   Kraig Peru, MD  ipratropium (ATROVENT) 0.02 % nebulizer solution Take 2.5 mLs (0.5 mg total) by nebulization every 6 (six) hours as needed (wheezing). 10/03/23    Kraig Peru, MD  levalbuterol (XOPENEX) 0.63 MG/3ML nebulizer solution Take 3 mLs (0.63 mg total) by nebulization every 6 (six) hours as needed for wheezing or shortness of breath. 10/03/23   Trenten Watchman, Pranav M, MD  Naphazoline HCl (CLEAR EYES OP) Place 1 drop into both eyes daily as needed (dry eye).    [provider]  nystatin (MYCOSTATIN) 100000 UNIT/ML suspension Take 5 mLs (500,000 Units total) by mouth 4 (four) times daily for 10 days. 10/03/23 10/13/23  Octavia Velador, Pranav M, MD  omeprazole (PRILOSEC) 40 MG capsule Take 40 mg by mouth daily. 09/07/20   [provider]  oxymetazoline (AFRIN) 0.05 % nasal spray Place 1 spray into both nostrils as needed (nose bleeds).    [provider]  polyethylene glycol (MIRALAX  / GLYCOLAX ) 17 g packet Take 17 g by mouth daily. 10/04/23   Kraig Peru, MD  senna-docusate (SENOKOT-S) 8.6-50 MG tablet Take 2 tablets by mouth 2 (two) times daily as needed for moderate constipation. 11/15/20   Haydee Lipa, MD  sertraline  (ZOLOFT ) 25 MG tablet Take 75 mg by mouth daily.    [provider]  spironolactone  (ALDACTONE ) 25 MG tablet Take 1 tablet (25 mg total) by mouth daily. 09/09/23   Deforest Fast, MD  tamsulosin  (FLOMAX ) 0.4 MG CAPS capsule Take 1 capsule (0.4 mg total) by mouth daily after breakfast. 09/09/23   Deforest Fast, MD  torsemide  (DEMADEX ) 20 MG tablet Take 1 tablet (20 mg total) by mouth daily. 09/09/23   Deforest Fast, MD    Physical Exam: Vitals:   10/05/23 1654 10/05/23 1700 10/05/23 1800 10/05/23 1900  BP: (!) 116/52 126/88 (!) 112/95 (!) 143/82  Pulse: 92 93 94 90  Resp: (!) 34 18 20 19   Temp: (!) 102.6 F (39.2 C)     TempSrc: Rectal     SpO2:  100% 100% 98%   Constitutional: Chronically ill-appearing thin elderly man resting in bed, wearing BiPAP Eyes: EOMI, lids and conjunctivae normal ENMT: Mucous membranes are moist. Posterior pharynx clear of any exudate or lesions Neck: normal, supple, no  masses. Respiratory: clear to auscultation bilaterally and normal respiratory effort while on BiPAP. No accessory muscle use.  Cardiovascular: Regular rate and rhythm, no murmurs / rubs / gallops. No extremity edema. 2+ pedal pulses. Abdomen: no tenderness, no masses palpated. Musculoskeletal: no clubbing / cyanosis. No joint deformity upper and lower extremities. Good ROM, no contractures. Normal muscle tone.  Thin extremities with muscle  wasting throughout. Skin: no rashes, lesions, ulcers. No induration Neurologic: Sensation intact. Strength equal bilaterally. Psychiatric: Normal judgment and insight. Alert and oriented x 3. Normal mood.   EKG: Personally reviewed. Sinus rhythm, rate 97, LBBB.  Rate is faster when compared to prior.  Assessment/Plan Principal Problem:   Severe sepsis (HCC) Active Problems:   Acute respiratory failure with hypoxia (HCC)   Stage 3a chronic kidney disease (CKD) (HCC)   Chronic HFrEF (heart failure with reduced ejection fraction) (HCC)   Chronic pain syndrome   BPH (benign prostatic hyperplasia)   Depression with anxiety   Elevated troponin   Brian Ruiz is a 78 y.o. male with medical history significant for HFrEF (EF 30%), PAF on Eliquis  (recently on hold for rectal bleeding), CKD stage IIIa with left hydronephrosis, HTN, BPH, depression/anxiety, chronic pain with narcotic dependence who is admitted with severe sepsis and acute hypoxic respiratory failure.  Assessment and Plan: Severe sepsis with acute hypoxic respiratory failure: Patient presenting with fever, tachypnea, leukocytosis, lactic acidosis, and hypoxia requiring BiPAP on admission.  CXR with persistent left-sided patchy opacities.  Possible pneumonia as infectious etiology but not completely clear. - Continue broad-spectrum empiric antibiotics with IV vancomycin, cefepime , Flagyl  - Follow blood cultures - Obtain UA - Check COVID/flu/RSV panel, strep pneumonia and Legionella urine  antigens - BP is stable and lactic acid has cleared, holding off on aggressive fluid resuscitation given HFrEF - Continue BiPAP as needed and wean to nasal cannula as able  CKD stage IIIa: BPH with history of urinary retention Mild left hydronephrosis: Creatinine 1.52 on admission compared to recent baseline 1.1-1.4.  Renal US  on 5/5 showed mild left-sided hydronephrosis.  History of urinary retention but patient reports good urine output on his own without dysuria. - Obtain urinalysis - Bladder scan and In-N-Out cath as needed - Continue Flomax   Chronic HFrEF Recent cardiogenic shock due to Takotsubo cardiomyopathy Elevated troponin: Last EF 30%.  Overall appears hypovolemic on admission.  CXR without pulmonary edema.  Given septic presentation and renal dysfunction will hold spironolactone  and torsemide  for now.  Moderate troponin elevation is likely demand ischemia in setting of severe sepsis and acute hypoxic respiratory failure.  Recent cardiac cath for 09/01/23 showed normal coronary angiography.  Paroxysmal atrial fibrillation: Stable in sinus rhythm on admission.  Continue amiodarone  200 mg daily.  Eliquis  on hold until 5/10 due to recent GI bleed.  Recent lower GI bleed from suspected stercoral rectal ulceration: Patient denies any further bleeding since discharge from hospital on 5/6.  Eliquis  on hold until 5/10 per GI recommendations.  Continue bowel regimen.  Chronic pain syndrome with narcotic dependence: Resume home chronic Dilaudid  4 mg every 8 hours with hold parameters when off BiPAP and tolerating orals, using IV Dilaudid  1 mg every 8 hours until then.  Depression/anxiety: Continue Zoloft .  Memory loss, early dementia: Mentation appears stable on admission, oriented and answering questions appropriately.  Delirium precautions.   DVT prophylaxis: SCDs Start: 10/05/23 2019 Code Status:   Code Status: Limited: Do not attempt resuscitation (DNR) -DNR-LIMITED -Do Not  Intubate/DNI   discussed with patient on admission.  MOST form at bedside. Family Communication: Discussed with patient, he has discussed with family Disposition Plan: From SNF, dispo pending clinical progress Consults called: None Severity of Illness: The appropriate patient status for this patient is INPATIENT. Inpatient status is judged to be reasonable and necessary in order to provide the required intensity of service to ensure the patient's safety. The patient's presenting symptoms, physical  exam findings, and initial radiographic and laboratory data in the context of their chronic comorbidities is felt to place them at high risk for further clinical deterioration. Furthermore, it is not anticipated that the patient will be medically stable for discharge from the hospital within 2 midnights of admission.   * I certify that at the point of admission it is my clinical judgment that the patient will require inpatient hospital care spanning beyond 2 midnights from the point of admission due to high intensity of service, high risk for further deterioration and high frequency of surveillance required.Edith Gores MD [facility]  If 7PM-7AM, please contact night-coverage [email or web address]  10/05/2023, 8:38 PM

## 2023-10-05 NOTE — ED Notes (Signed)
 ED TO INPATIENT HANDOFF REPORT  ED Nurse Name and Phone #: floor RN  [phone]   S Name/Age/Gender Brian Ruiz 78 y.o. male Room/Bed: 006C/006C  Code Status   Code Status: Limited: Do not attempt resuscitation (DNR) -DNR-LIMITED -Do Not Intubate/DNI   Home/SNF/Other Home Patient oriented to: situation Is this baseline? Yes   Triage Complete: Triage complete  Chief Complaint Severe sepsis (HCC) [A41.9, R65.20]  Triage Note Ems from [facility] for sob x hours - 1430 post PT With diagnosis of chf. Ems placed on cpap upon arrival  Lungs bilateral crackles, now more clear due to cpap administration  Vital Signs with EMS 55 %RA, once on bipap 83 A&Ox4 with ems  140/80, 187/110 100 ST  100.0 Etco2 23 40 RR Hx: Needs home o2, due to CHF recent diagnosis  Recent covid hospital stay GI bleed    Allergies Allergies  Allergen Reactions   Nsaids Shortness Of Breath and Other (See Comments)    Bronchospasm    Roxicodone  [Oxycodone ] Other (See Comments)    Psychomotor agitation   Tolectin [Tolmetin] Shortness Of Breath   Ultram [Tramadol] Anaphylaxis   Actiq  [Fentanyl ] Other (See Comments)    Unknown reaction   Codeine Anxiety and Other (See Comments)    Bronchospasm Irritability     Level of Care/Admitting Diagnosis ED Disposition     ED Disposition  Admit   Condition  --   Comment  Hospital Area: [facility] [[id number]]  Level of Care: Progressive [102]  Admit to Progressive based on following criteria: RESPIRATORY PROBLEMS hypoxemic/hypercapnic respiratory failure that is responsive to NIPPV (BiPAP) or High Flow Nasal Cannula (6-80 lpm). Frequent assessment/intervention, no > Q2 hrs < Q4 hrs, to maintain oxygenation and pulmonary hygiene.  May admit patient to [facility] Ben[facility] or [facility] Smalling if equivalent level of care is available:: No  Covid Evaluation: Symptomatic Person Under Investigation (PUI) or recent exposure (last 10 days) *Testing  Required*  Diagnosis: Severe sepsis [facility]) [[id number]]  Admitting Physician: Kenny Peals [[id number]]  Attending Physician: Kenny Peals [[id number]]  Certification:: I certify this patient will need inpatient services for at least 2 midnights  Expected Medical Readiness: 10/09/2023          B Medical/Surgery History Past Medical History:  Diagnosis Date   Arthritis    Back pain, chronic    intolerant to narcotics, avoids NSAIDs due to vioxx related bleed,  sees pain clinic as [place], prior eval by [facility] neuro in [place]   Cholecystitis    Choledocholithiasis    Chronic diastolic CHF (congestive heart failure) (HCC) 05/16/2018   Per Dr Letta Raw note in 2019, pt did not have heart failure but hypertensive heart disease.  Azzie Bollman FNP-C   Encephalopathy acute 05/15/2011   Heart murmur    Hypertension    Inguinal hernia    Polyneuropathy    S/P thyroidectomy 1967   Scoliosis    Urinary retention    Past Surgical History:  Procedure Laterality Date   BACK SURGERY  1975   lumbx2   BONE BIOPSY  09/30/2023   Procedure: BIOPSY, GI;  Surgeon: Nannette Babe, MD;  Location: [facility] ENDOSCOPY;  Service: Gastroenterology;;   CARDIOVERSION N/A 09/04/2023   Procedure: CARDIOVERSION;  Surgeon: Darlis Eisenmenger, MD;  Location: [facility] INVASIVE CV LAB;  Service: Cardiovascular;  Laterality: N/A;   CHOLECYSTECTOMY N/A 11/13/2020   Procedure: LAPAROSCOPIC CHOLECYSTECTOMY WITH INTRAOPERATIVE CHOLANGIOGRAM;  Surgeon: Dorena Gander, MD;  Location: [facility] OR;  Service: General;  Laterality: N/A;   COLONOSCOPY     COLONOSCOPY N/A 09/29/2023   Procedure: COLONOSCOPY;  Surgeon: Nannette Babe, MD;  Location: [facility] ENDOSCOPY;  Service: Gastroenterology;  Laterality: N/A;   COLONOSCOPY N/A 09/30/2023   Procedure: COLONOSCOPY;  Surgeon: Nannette Babe, MD;  Location: [facility] ENDOSCOPY;  Service: Gastroenterology;  Laterality: N/A;   FOOT SURGERY  1985, 2000   due to arthritis   HAND SURGERY  2005   left - due to arthritis    INGUINAL HERNIA REPAIR  04/30/2012   Procedure: HERNIA REPAIR INGUINAL ADULT;  Surgeon: Darcella Earnest, MD;  Location: [facility];  Service: General;  Laterality: Left;  repair left inguinal hernia   POLYPECTOMY  09/30/2023   Procedure: POLYPECTOMY, INTESTINE;  Surgeon: Nannette Babe, MD;  Location: [facility] ENDOSCOPY;  Service: Gastroenterology;;   RIGHT/LEFT HEART CATH AND CORONARY ANGIOGRAPHY N/A 09/01/2023   Procedure: RIGHT/LEFT HEART CATH AND CORONARY ANGIOGRAPHY;  Surgeon: Lauralee Poll, MD;  Location: [facility] INVASIVE CV LAB;  Service: Cardiovascular;  Laterality: N/A;   THYROIDECTOMY  1967   TONSILLECTOMY     TRANSESOPHAGEAL ECHOCARDIOGRAM (CATH LAB) N/A 09/04/2023   Procedure: TRANSESOPHAGEAL ECHOCARDIOGRAM;  Surgeon: Darlis Eisenmenger, MD;  Location: [facility] INVASIVE CV LAB;  Service: Cardiovascular;  Laterality: N/A;     A IV Location/Drains/Wounds Patient Lines/Drains/Airways Status     Active Line/Drains/Airways     Name Placement date Placement time Site Days   Peripheral IV 10/05/23 20 G Posterior;Right Hand 10/05/23  1648  Hand  less than 1   Peripheral IV 10/05/23 20 G Right Antecubital 10/05/23  1648  Antecubital  less than 1   Wound / Incision (Open or Dehisced) 09/27/23 Irritant Dermatitis (Moisture Associated Skin Damage) Perineum 09/27/23  1715  Perineum  8   Wound / Incision (Open or Dehisced) 09/27/23 Irritant Dermatitis (Moisture Associated Skin Damage) Sacrum Bilateral 09/27/23  1715  Sacrum  8   Wound / Incision (Open or Dehisced) 09/27/23 Other (Comment) Heel Left 09/27/23  1715  Heel  8            Intake/Output Last 24 hours  Intake/Output Summary (Last 24 hours) at 10/05/2023 2206 Last data filed at 10/05/2023 1955 Gross per 24 hour  Intake 100.26 ml  Output --  Net 100.26 ml    Labs/Imaging Results for orders placed or performed during the hospital encounter of 10/05/23 (from the past 48 hours)  Comprehensive metabolic panel     Status: Abnormal    Collection Time: 10/05/23  4:51 PM  Result Value Ref Range   Sodium 133 (L) 135 - 145 mmol/L   Potassium 3.3 (L) 3.5 - 5.1 mmol/L   Chloride 95 (L) 98 - 111 mmol/L   CO2 23 22 - 32 mmol/L   Glucose, Bld 114 (H) 70 - 99 mg/dL    Comment: Glucose reference range applies only to samples taken after fasting for at least 8 hours.   BUN 21 8 - 23 mg/dL   Creatinine, Ser 1.61 (H) 0.61 - 1.24 mg/dL   Calcium 8.9 8.9 - 09.6 mg/dL   Total Protein 6.2 (L) 6.5 - 8.1 g/dL   Albumin  2.6 (L) 3.5 - 5.0 g/dL   AST 28 15 - 41 U/L   ALT 20 0 - 44 U/L   Alkaline Phosphatase 62 38 - 126 U/L   Total Bilirubin 0.8 0.0 - 1.2 mg/dL   GFR, Estimated 47 (L) >60 mL/min    Comment: (NOTE) Calculated using the  CKD-EPI Creatinine Equation (2021)    Anion gap 15 5 - 15    Comment: Performed at [facility], [address] [place], [place] [address]  Troponin I (High Sensitivity)     Status: Abnormal   Collection Time: 10/05/23  4:51 PM  Result Value Ref Range   Troponin I (High Sensitivity) 737 (HH) <18 ng/L    Comment: CRITICAL RESULT CALLED TO, READ BACK BY AND VERIFIED WITH MAO VANG, RN @ 1759 10/05/23 BY SEKDAHL (NOTE) Elevated high sensitivity troponin I (hsTnI) values and significant  changes across serial measurements may suggest ACS but many other  chronic and acute conditions are known to elevate hsTnI results.  Refer to the "Links" section for chest pain algorithms and additional  guidance. Performed at [facility], [address], [place], [place] [address]   CBC with Differential     Status: Abnormal   Collection Time: 10/05/23  4:51 PM  Result Value Ref Range   WBC 25.9 (H) 4.0 - 10.5 K/uL   RBC 3.71 (L) 4.22 - 5.81 MIL/uL   Hemoglobin 11.1 (L) 13.0 - 17.0 g/dL   HCT 82.9 (L) 56.2 - 13.0 %   MCV 94.1 80.0 - 100.0 fL   MCH 29.9 26.0 - 34.0 pg   MCHC 31.8 30.0 - 36.0 g/dL   RDW 86.5 78.4 - 69.6 %   Platelets 251 150 - 400 K/uL   nRBC 0.0 0.0 - 0.2 %   Neutrophils Relative  % 90 %   Neutro Abs 23.2 (H) 1.7 - 7.7 K/uL   Lymphocytes Relative 3 %   Lymphs Abs 0.7 0.7 - 4.0 K/uL   Monocytes Relative 5 %   Monocytes Absolute 1.3 (H) 0.1 - 1.0 K/uL   Eosinophils Relative 0 %   Eosinophils Absolute 0.0 0.0 - 0.5 K/uL   Basophils Relative 0 %   Basophils Absolute 0.1 0.0 - 0.1 K/uL   Immature Granulocytes 2 %   Abs Immature Granulocytes 0.47 (H) 0.00 - 0.07 K/uL    Comment: Performed at [facility], [address], [place], [place] [address]  Brain natriuretic peptide     Status: Abnormal   Collection Time: 10/05/23  4:51 PM  Result Value Ref Range   B Natriuretic Peptide 575.0 (H) 0.0 - 100.0 pg/mL    Comment: Performed at [facility], [address], [place], [place] [address]  I-Stat Lactic Acid     Status: Abnormal   Collection Time: 10/05/23  5:04 PM  Result Value Ref Range   Lactic Acid, Venous 2.3 (HH) 0.5 - 1.9 mmol/L   Comment NOTIFIED PHYSICIAN   Troponin I (High Sensitivity)     Status: Abnormal   Collection Time: 10/05/23  7:00 PM  Result Value Ref Range   Troponin I (High Sensitivity) 850 (HH) <18 ng/L    Comment: CRITICAL VALUE NOTED. VALUE IS CONSISTENT WITH PREVIOUSLY REPORTED/CALLED VALUE (NOTE) Elevated high sensitivity troponin I (hsTnI) values and significant  changes across serial measurements may suggest ACS but many other  chronic and acute conditions are known to elevate hsTnI results.  Refer to the "Links" section for chest pain algorithms and additional  guidance. Performed at [facility], [address] [place], [place] [address]   I-Stat Lactic Acid     Status: None   Collection Time: 10/05/23  7:00 PM  Result Value Ref Range   Lactic Acid, Venous 1.7 0.5 - 1.9 mmol/L  Resp  panel by RT-PCR (RSV, Flu A&B, Covid) Anterior Nasal Swab     Status: None   Collection Time: 10/05/23  8:17 PM   Specimen: Anterior Nasal Swab  Result Value Ref Range   SARS Coronavirus 2 by RT PCR NEGATIVE NEGATIVE   Influenza  A by PCR NEGATIVE NEGATIVE   Influenza B by PCR NEGATIVE NEGATIVE    Comment: (NOTE) The Xpert Xpress SARS-CoV-2/FLU/RSV plus assay is intended as an aid in the diagnosis of influenza from Nasopharyngeal swab specimens and should not be used as a sole basis for treatment. Nasal washings and aspirates are unacceptable for Xpert Xpress SARS-CoV-2/FLU/RSV testing.  Fact Sheet for Patients: BloggerCourse.com  Fact Sheet for Healthcare Providers: SeriousBroker.it  This test is not yet approved or cleared by the [place]  FDA and has been authorized for detection and/or diagnosis of SARS-CoV-2 by FDA under an Emergency Use Authorization (EUA). This EUA will remain in effect (meaning this test can be used) for the duration of the COVID-19 declaration under Section 564(b)(1) of the Act, 21 U.S.C. section 360bbb-3(b)(1), unless the authorization is terminated or revoked.     Resp Syncytial Virus by PCR NEGATIVE NEGATIVE    Comment: (NOTE) Fact Sheet for Patients: BloggerCourse.com  Fact Sheet for Healthcare Providers: SeriousBroker.it  This test is not yet approved or cleared by the [place]  FDA and has been authorized for detection and/or diagnosis of SARS-CoV-2 by FDA under an Emergency Use Authorization (EUA). This EUA will remain in effect (meaning this test can be used) for the duration of the COVID-19 declaration under Section 564(b)(1) of the Act, 21 U.S.C. section 360bbb-3(b)(1), unless the authorization is terminated or revoked.  Performed at [facility], [address], [place], [place] [address]   Urinalysis, w/ Reflex to Culture (Infection Suspected) -Urine, Clean Catch     Status: Abnormal   Collection Time: 10/05/23  9:12 PM  Result Value Ref Range   Specimen Source URINE, CLEAN CATCH    Color, Urine AMBER (A) YELLOW    Comment: BIOCHEMICALS MAY BE  AFFECTED BY COLOR   APPearance TURBID (A) CLEAR   Specific Gravity, Urine 1.011 1.005 - 1.030   pH 5.0 5.0 - 8.0   Glucose, UA NEGATIVE NEGATIVE mg/dL   Hgb urine dipstick MODERATE (A) NEGATIVE   Bilirubin Urine NEGATIVE NEGATIVE   Ketones, ur NEGATIVE NEGATIVE mg/dL   Protein, ur 30 (A) NEGATIVE mg/dL   Nitrite NEGATIVE NEGATIVE   Leukocytes,Ua LARGE (A) NEGATIVE   RBC / HPF 21-50 0 - 5 RBC/hpf   WBC, UA >50 0 - 5 WBC/hpf    Comment:        Reflex urine culture not performed if WBC <=10, OR if Squamous epithelial cells >5. If Squamous epithelial cells >5 suggest recollection.    Bacteria, UA FEW (A) NONE SEEN   Squamous Epithelial / HPF 0-5 0 - 5 /HPF   WBC Clumps PRESENT    Budding Yeast PRESENT     Comment: Performed at [facility], [address] [place], [place] [address]   DG Chest Port 1 View Result Date: 10/05/2023 CLINICAL DATA:  Shortness of breath. EXAM: PORTABLE CHEST 1 VIEW COMPARISON:  CTA chest dated 10/02/2023. Chest radiograph dated 10/01/2023. FINDINGS: Chronic elevation of the right hemidiaphragm. Stable cardiomediastinal silhouette. Patchy opacities again noted in the left lung. Bibasilar atelectasis. No pneumothorax. No acute osseous abnormality. Gas distended loops of bowel in the right upper quadrant. IMPRESSION: 1. Similar patchy opacities in the  left lung. 2. Bibasilar atelectasis. 3. Gas distended loops of bowel in the right upper quadrant. Electronically Signed   By: Mannie Seek M.D.   On: 10/05/2023 17:36    Pending Labs Unresulted Labs (From admission, onward)     Start     Ordered   10/06/23 0500  CBC  Tomorrow morning,   R        10/05/23 2022   10/06/23 0500  Comprehensive metabolic panel  Tomorrow morning,   R        10/05/23 2022   10/05/23 2112  Urine Culture  Once,   AD        10/05/23 2112   10/05/23 2033  MRSA Next Gen by PCR, Nasal  (MRSA Screening)  Once,   R        10/05/23 2033   10/05/23 2022  Strep pneumoniae urinary  antigen  Once,   R        10/05/23 2022   10/05/23 2022  Legionella Pneumophila Serogp 1 Ur Ag  Once,   R        10/05/23 2022   10/05/23 1651  Culture, blood (routine x 2)  BLOOD CULTURE X 2,   R      10/05/23 1651            Vitals/Pain Today's Vitals   10/05/23 1800 10/05/23 1900 10/05/23 2024 10/05/23 2045  BP: (!) 112/95 (!) 143/82  116/66  Pulse: 94 90  91  Resp: 20 19  20   Temp:    99.8 F (37.7 C)  TempSrc:    Axillary  SpO2: 100% 98%  95%  PainSc:   0-No pain     Isolation Precautions Droplet precaution  Medications Medications  lactated ringers  infusion ( Intravenous New Bag/Given 10/05/23 1922)  sodium chloride  flush (NS) 0.9 % injection 3 mL (3 mLs Intravenous Not Given 10/05/23 2022)  metroNIDAZOLE  (FLAGYL ) IVPB 500 mg (has no administration in time range)  acetaminophen  (TYLENOL ) tablet 650 mg (has no administration in time range)    Or  acetaminophen  (TYLENOL ) suppository 650 mg (has no administration in time range)  ondansetron  (ZOFRAN ) tablet 4 mg (has no administration in time range)    Or  ondansetron  (ZOFRAN ) injection 4 mg (has no administration in time range)  senna-docusate (Senokot-S) tablet 1 tablet (has no administration in time range)  bisacodyl (DULCOLAX) EC tablet 5 mg (has no administration in time range)  guaiFENesin  (MUCINEX ) 12 hr tablet 600 mg (600 mg Oral Not Given 10/05/23 2115)  amiodarone  (PACERONE ) tablet 200 mg (has no administration in time range)  HYDROmorphone  (DILAUDID ) tablet 4 mg (4 mg Oral Not Given 10/05/23 2121)  levalbuterol (XOPENEX) nebulizer solution 0.63 mg (has no administration in time range)  polyethylene glycol (MIRALAX  / GLYCOLAX ) packet 17 g (has no administration in time range)  sertraline  (ZOLOFT ) tablet 25 mg (25 mg Oral Not Given 10/05/23 2117)  tamsulosin  (FLOMAX ) capsule 0.4 mg (has no administration in time range)  vancomycin (VANCOCIN) IVPB 1000 mg/200 mL premix (has no administration in time range)  ceFEPIme   (MAXIPIME ) 2 g in sodium chloride  0.9 % 100 mL IVPB (has no administration in time range)  potassium chloride  (KLOR-CON ) packet 40 mEq (40 mEq Oral Not Given 10/05/23 2121)  HYDROmorphone  (DILAUDID ) injection 1 mg (has no administration in time range)  ceFEPIme  (MAXIPIME ) 2 g in sodium chloride  0.9 % 100 mL IVPB (0 g Intravenous Stopped 10/05/23 1955)  vancomycin (VANCOREADY) IVPB 1500 mg/300 mL (1,500 mg  Intravenous New Bag/Given 10/05/23 2001)    Mobility non-ambulatory     Focused Assessments Cardiac Assessment Handoff:  Cardiac Rhythm: Normal sinus rhythm, Heart block Lab Results  Component Value Date   CKTOTAL 872 (H) 05/17/2011   CKMB 7.4 (HH) 05/17/2011   TROPONINI <0.30 05/17/2011   No results found for: "DDIMER" Does the Patient currently have chest pain? No    R Recommendations: See Admitting Provider Note  Report given to:   Additional Notes: bipap NPO

## 2023-10-05 NOTE — Progress Notes (Signed)
 Pharmacy Antibiotic Note  Brian Ruiz is a 78 y.o. male admitted on 10/05/2023 with sepsis suspeted pneumonia.  Pharmacy has been consulted for zosyn  and vancomycin dosing. Recently discharged on 10/03/2023 with rectal bleeding. Scr near baseline.   Plan: Cefepime  2g q12h.  Vancomycin 1000mg  q24h, eAUC: 460. IBW, Vd: 0.72, and Scr: 1.52.  F/u MRSA PCR.      Temp (24hrs), Avg:102.6 F (39.2 C), Min:102.6 F (39.2 C), Max:102.6 F (39.2 C)  Recent Labs  Lab 09/30/23 0413 09/30/23 1154 10/01/23 0843 10/02/23 0601 10/03/23 1007 10/05/23 1651 10/05/23 1704 10/05/23 1900  WBC 9.5  --  15.0* 15.8* 18.3* 25.9*  --   --   CREATININE 1.65* 1.40* 1.43* 1.16 1.35* 1.52*  --   --   LATICACIDVEN  --   --   --   --   --   --  2.3* 1.7    Estimated Creatinine Clearance: 43.1 mL/min (A) (by C-G formula based on SCr of 1.52 mg/dL (H)).    Allergies  Allergen Reactions   Nsaids Shortness Of Breath and Other (See Comments)    Bronchospasm    Roxicodone  [Oxycodone ] Other (See Comments)    Psychomotor agitation   Tolectin [Tolmetin] Shortness Of Breath   Ultram [Tramadol] Anaphylaxis   Actiq  [Fentanyl ] Other (See Comments)    Unknown reaction   Codeine Anxiety and Other (See Comments)    Bronchospasm Irritability     Antimicrobials this admission: Cefepime  5/8 >>  Vancomycin 5/8 >>   Microbiology results: 5/8 BCx:  5/8 MRSA PCR:   Thank you for allowing pharmacy to be a part of this patient's care.  Mamie Searles, PharmD, BCCCP  10/05/2023 8:28 PM

## 2023-10-05 NOTE — ED Provider Notes (Signed)
 [place] [facility] AT [facility] Provider Note   CSN: [id number] Arrival date & time: 10/05/23  1645     History  Chief Complaint  Patient presents with   Shortness of Breath   Respiratory Distress    Brian Ruiz is a 78 y.o. male.  HPI 77/M discharged from the hospital 2 days ago.  His medical history is for systolic CHF, A-fib on Eliquis  memory, cognitive deficits, depression, anxiety, chronic pain, opioid dependence was recently hospitalized with hypoxic respiratory failure from CHF and COVID, complicated by cardiogenic shock, low output failure treated with milrinone  subsequently discharged to SNF with palliative care on 4/11 back in the ED 4/30 with rectal bleeding at SNF.  And readmitted.  He was discharged to [facility] rehab facility.  Patient's wife is visiting today and about 330 he was noted to have increasing difficulty breathing.  Patient reports that he has "chest pain like usual".  He does report he feels short of breath.  EMS reports that on arrival patient was not on any supplemental oxygen which he had been discharged on.  They report arrival oxygen saturations were in the 50s.  They placed him on BiPAP and oxygen saturations up to upper 80s.  Patient denies he has been vomiting or had substantial changes since his discharge.  Review of EMR indicates that patient had poor prognosis and likely deterioration with plan for palliative care.    Home Medications Prior to Admission medications   Medication Sig Start Date End Date Taking? Authorizing Provider  acetaminophen  (TYLENOL ) 500 MG tablet Take 1,000 mg by mouth every 8 (eight) hours as needed for mild pain (pain score 1-3) or moderate pain (pain score 4-6). For pain.    [provider]  amiodarone  (PACERONE ) 200 MG tablet Take 200 mg by mouth daily.    [provider]  amoxicillin-clavulanate (AUGMENTIN) 875-125 MG tablet Take 1 tablet by mouth 2 (two) times daily for 7  days. 10/03/23 10/13/2023  Kraig Peru, MD  apixaban  (ELIQUIS ) 5 MG TABS tablet Take 1 tablet (5 mg total) by mouth 2 (two) times daily. 10/07/23   Kraig Peru, MD  Artificial Tear Ointment (DRY EYES OP) Apply 1 drop to eye daily as needed (dry eyes). Patient not taking: Reported on 09/27/2023    [provider]  benzonatate (TESSALON) 100 MG capsule Take 1 capsule (100 mg total) by mouth 3 (three) times daily. 10/03/23   Kraig Peru, MD  dapagliflozin  propanediol (FARXIGA ) 10 MG TABS tablet Take 10 mg by mouth daily. Patient not taking: Reported on 09/27/2023    [provider]  dextromethorphan (DELSYM) 30 MG/5ML liquid Take 2.5 mLs (15 mg total) by mouth 2 (two) times daily. 10/03/23   Kraig Peru, MD  gabapentin  (NEURONTIN ) 400 MG capsule Take 400 mg by mouth at bedtime as needed (back pain).    [provider]  guaiFENesin  (MUCINEX ) 600 MG 12 hr tablet Take 2 tablets (1,200 mg total) by mouth 2 (two) times daily for 7 days. 10/03/23 10/02/2023  Kraig Peru, MD  HYDROmorphone  (DILAUDID ) 4 MG tablet Take 1 tablet (4 mg total) by mouth every 8 (eight) hours. 10/03/23   Kraig Peru, MD  ipratropium (ATROVENT) 0.02 % nebulizer solution Take 2.5 mLs (0.5 mg total) by nebulization every 6 (six) hours as needed (wheezing). 10/03/23   Kraig Peru, MD  levalbuterol (XOPENEX) 0.63 MG/3ML nebulizer solution Take 3 mLs (0.63 mg total) by nebulization every  6 (six) hours as needed for wheezing or shortness of breath. 10/03/23   Patel, Pranav M, MD  Naphazoline HCl (CLEAR EYES OP) Place 1 drop into both eyes daily as needed (dry eye).    [provider]  nystatin (MYCOSTATIN) 100000 UNIT/ML suspension Take 5 mLs (500,000 Units total) by mouth 4 (four) times daily for 10 days. 10/03/23 10/13/23  Patel, Pranav M, MD  omeprazole (PRILOSEC) 40 MG capsule Take 40 mg by mouth daily. 09/07/20   [provider]  oxymetazoline (AFRIN) 0.05 % nasal spray Place 1 spray into  both nostrils as needed (nose bleeds).    [provider]  polyethylene glycol (MIRALAX  / GLYCOLAX ) 17 g packet Take 17 g by mouth daily. 10/04/23   Kraig Peru, MD  senna-docusate (SENOKOT-S) 8.6-50 MG tablet Take 2 tablets by mouth 2 (two) times daily as needed for moderate constipation. 11/15/20   Haydee Lipa, MD  sertraline  (ZOLOFT ) 25 MG tablet Take 75 mg by mouth daily.    [provider]  spironolactone  (ALDACTONE ) 25 MG tablet Take 1 tablet (25 mg total) by mouth daily. 09/09/23   Deforest Fast, MD  tamsulosin  (FLOMAX ) 0.4 MG CAPS capsule Take 1 capsule (0.4 mg total) by mouth daily after breakfast. 09/09/23   Deforest Fast, MD  torsemide  (DEMADEX ) 20 MG tablet Take 1 tablet (20 mg total) by mouth daily. 09/09/23   Deforest Fast, MD      Allergies    Nsaids, Oxycodone , Tolmetin, Tramadol, Fentanyl , and Codeine    Review of Systems   Review of Systems  Physical Exam Updated Vital Signs BP (!) 116/52 (BP Location: Left Arm)   Pulse 92   Temp (!) 102.6 F (39.2 C) (Rectal)   Resp (!) 34   SpO2 100%  Physical Exam Constitutional:      Comments: Ill appearance, deconditioned and frail. On Bipap. Alert, speaking full short sentences.  HENT:     Head: Normocephalic and atraumatic.     Mouth/Throat:     Mouth: Mucous membranes are dry.     Pharynx: Oropharynx is clear.  Eyes:     Extraocular Movements: Extraocular movements intact.  Cardiovascular:     Rate and Rhythm: Normal rate and regular rhythm.  Pulmonary:     Comments: Moderate increased WOB. Decreased BS RLL. Left grossly clear Abdominal:     General: There is no distension.     Palpations: Abdomen is soft.     Tenderness: There is no abdominal tenderness.  Musculoskeletal:     Comments: Thin, muscular atrophy, no sig peripheral edema  Skin:    General: Skin is warm and dry.     Coloration: Skin is pale.  Neurological:     General: No focal deficit present.     ED Results /  Procedures / Treatments   Labs (all labs ordered are listed, but only abnormal results are displayed) Labs Reviewed  COMPREHENSIVE METABOLIC PANEL WITH GFR - Abnormal; Notable for the following components:      Result Value   Sodium 133 (*)    Potassium 3.3 (*)    Chloride 95 (*)    Glucose, Bld 114 (*)    Creatinine, Ser 1.52 (*)    Total Protein 6.2 (*)    Albumin  2.6 (*)    GFR, Estimated 47 (*)    All other components within normal limits  CBC WITH DIFFERENTIAL/PLATELET - Abnormal; Notable for the following components:   WBC 25.9 (*)    RBC  3.71 (*)    Hemoglobin 11.1 (*)    HCT 34.9 (*)    Neutro Abs 23.2 (*)    Monocytes Absolute 1.3 (*)    Abs Immature Granulocytes 0.47 (*)    All other components within normal limits  BRAIN NATRIURETIC PEPTIDE - Abnormal; Notable for the following components:   B Natriuretic Peptide 575.0 (*)    All other components within normal limits  I-STAT CG4 LACTIC ACID, ED - Abnormal; Notable for the following components:   Lactic Acid, Venous 2.3 (*)    All other components within normal limits  TROPONIN I (HIGH SENSITIVITY) - Abnormal; Notable for the following components:   Troponin I (High Sensitivity) 737 (*)    All other components within normal limits  CULTURE, BLOOD (ROUTINE X 2)  CULTURE, BLOOD (ROUTINE X 2)  URINALYSIS, ROUTINE W REFLEX MICROSCOPIC  I-STAT CG4 LACTIC ACID, ED  TROPONIN I (HIGH SENSITIVITY)    EKG EKG Interpretation Date/Time:  Thursday Oct 05 2023 17:02:20 EDT Ventricular Rate:  97 PR Interval:  199 QRS Duration:  133 QT Interval:  354 QTC Calculation: 450 R Axis:   -37  Text Interpretation: Sinus rhythm 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 ant r wave poor progression changed from previous Confirmed by Wynetta Heckle [phone]) on 10/05/2023 6:10:11 PM  Radiology DG Chest Port 1 View Result Date: 10/05/2023 CLINICAL DATA:  Shortness of breath. EXAM: PORTABLE CHEST 1 VIEW COMPARISON:  CTA chest dated 10/02/2023.  Chest radiograph dated 10/01/2023. FINDINGS: Chronic elevation of the right hemidiaphragm. Stable cardiomediastinal silhouette. Patchy opacities again noted in the left lung. Bibasilar atelectasis. No pneumothorax. No acute osseous abnormality. Gas distended loops of bowel in the right upper quadrant. IMPRESSION: 1. Similar patchy opacities in the left lung. 2. Bibasilar atelectasis. 3. Gas distended loops of bowel in the right upper quadrant. Electronically Signed   By: Mannie Seek M.D.   On: 10/05/2023 17:36    Procedures Procedures   CRITICAL CARE Performed by: Wynetta Heckle   Total critical care time: 30 minutes  Critical care time was exclusive of separately billable procedures and treating other patients.  Critical care was necessary to treat or prevent imminent or life-threatening deterioration.  Critical care was time spent personally by me on the following activities: development of treatment plan with patient and/or surrogate as well as nursing, discussions with consultants, evaluation of patient's response to treatment, examination of patient, obtaining history from patient or surrogate, ordering and performing treatments and interventions, ordering and review of laboratory studies, ordering and review of radiographic studies, pulse oximetry and re-evaluation of patient's condition.  Medications Ordered in ED Medications  lactated ringers  infusion (has no administration in time range)  vancomycin (VANCOCIN) IVPB 1000 mg/200 mL premix (has no administration in time range)  ceFEPIme  (MAXIPIME ) 2 g in sodium chloride  0.9 % 100 mL IVPB (has no administration in time range)    ED Course/ Medical Decision Making/ A&P                                 Medical Decision Making Amount and/or Complexity of Data Reviewed Labs: ordered. Radiology: ordered.  Risk Prescription drug management. Decision regarding hospitalization.   Patient was discharged from the hospital 2 days  ago.  He has severe comorbid conditions of CHF and chronic respiratory failure.  Patient experienced worsening difficulty breathing this afternoon.  Will proceed with diagnostic evaluation.  On arrival he is  on BiPAP but he is alert and does not have severe distress.  He is speaking in short full sentences.  At this time does not require intubation.  Patient does have DNR which specifies no intubation however EMS reports that patient's wife had stated they might consider intubation if it was a temporary measure.  Chest x-ray shows some patchy infiltrates which per radiology.  Similar to previous.  Patient has 25,000 white count and is febrile to 102.6.  At this time likely pneumonia either posthospitalization or evolving since previous hospitalization.  Will initiate antibiotic therapy and sepsis order set.  At this time patient's blood pressures are stable at 116/52.  Heart rate is at 92.  Patient has history of significant CHF.  At this time I do not feel that he needs fluid bolus.  MS blood pressures were 140/80 so at this point patient has not exhibited hypotension.  Recheck 18: 30 patient has improved and at this time I think he could trial transition to oxygen off Bipap.  Patient failed trial off of BiPAP.  Respiratory therapy reports that patient saturations dropped down into the 70s and he became more dyspneic.  Patient will be maintained on BiPAP.  Consult: Reviewed with Dr. Lydia Sams [facility] hospitalist for admission.        Final Clinical Impression(s) / ED Diagnoses Final diagnoses:  HCAP (healthcare-associated pneumonia)  Severe comorbid illness    Rx / DC Orders ED Discharge Orders     None         Wynetta Heckle, MD 10/05/23 1905

## 2023-10-05 NOTE — Progress Notes (Signed)
 eLink is following this Code Sepsis.

## 2023-10-06 DIAGNOSIS — J9601 Acute respiratory failure with hypoxia: Secondary | ICD-10-CM

## 2023-10-06 DIAGNOSIS — F418 Other specified anxiety disorders: Secondary | ICD-10-CM

## 2023-10-06 DIAGNOSIS — J189 Pneumonia, unspecified organism: Secondary | ICD-10-CM | POA: Diagnosis not present

## 2023-10-06 DIAGNOSIS — Z515 Encounter for palliative care: Secondary | ICD-10-CM

## 2023-10-06 DIAGNOSIS — N1831 Chronic kidney disease, stage 3a: Secondary | ICD-10-CM

## 2023-10-06 DIAGNOSIS — I5022 Chronic systolic (congestive) heart failure: Secondary | ICD-10-CM | POA: Diagnosis not present

## 2023-10-06 DIAGNOSIS — R69 Illness, unspecified: Secondary | ICD-10-CM | POA: Diagnosis not present

## 2023-10-06 DIAGNOSIS — A419 Sepsis, unspecified organism: Secondary | ICD-10-CM | POA: Diagnosis not present

## 2023-10-06 LAB — CBC
HCT: 31 % — ABNORMAL LOW (ref 39.0–52.0)
Hemoglobin: 10.3 g/dL — ABNORMAL LOW (ref 13.0–17.0)
MCH: 30.6 pg (ref 26.0–34.0)
MCHC: 33.2 g/dL (ref 30.0–36.0)
MCV: 92 fL (ref 80.0–100.0)
Platelets: 223 10*3/uL (ref 150–400)
RBC: 3.37 MIL/uL — ABNORMAL LOW (ref 4.22–5.81)
RDW: 13.8 % (ref 11.5–15.5)
WBC: 32.2 10*3/uL — ABNORMAL HIGH (ref 4.0–10.5)
nRBC: 0 % (ref 0.0–0.2)

## 2023-10-06 LAB — MRSA NEXT GEN BY PCR, NASAL: MRSA by PCR Next Gen: NOT DETECTED

## 2023-10-06 LAB — COMPREHENSIVE METABOLIC PANEL WITH GFR
ALT: 17 U/L (ref 0–44)
AST: 21 U/L (ref 15–41)
Albumin: 2.3 g/dL — ABNORMAL LOW (ref 3.5–5.0)
Alkaline Phosphatase: 58 U/L (ref 38–126)
Anion gap: 13 (ref 5–15)
BUN: 20 mg/dL (ref 8–23)
CO2: 24 mmol/L (ref 22–32)
Calcium: 8.6 mg/dL — ABNORMAL LOW (ref 8.9–10.3)
Chloride: 102 mmol/L (ref 98–111)
Creatinine, Ser: 1.45 mg/dL — ABNORMAL HIGH (ref 0.61–1.24)
GFR, Estimated: 50 mL/min — ABNORMAL LOW (ref >60)
Glucose, Bld: 112 mg/dL — ABNORMAL HIGH (ref 70–99)
Potassium: 2.9 mmol/L — ABNORMAL LOW (ref 3.5–5.1)
Sodium: 139 mmol/L (ref 135–145)
Total Bilirubin: 0.9 mg/dL (ref 0.0–1.2)
Total Protein: 5.7 g/dL — ABNORMAL LOW (ref 6.5–8.1)

## 2023-10-06 LAB — URINE CULTURE: Culture: 70000 — AB

## 2023-10-06 LAB — MAGNESIUM: Magnesium: 1.8 mg/dL (ref 1.7–2.4)

## 2023-10-06 LAB — PROCALCITONIN: Procalcitonin: 1.01 ng/mL

## 2023-10-06 LAB — TROPONIN I (HIGH SENSITIVITY): Troponin I (High Sensitivity): 713 ng/L (ref <18)

## 2023-10-06 LAB — STREP PNEUMONIAE URINARY ANTIGEN: Strep Pneumo Urinary Antigen: NEGATIVE

## 2023-10-06 MED ORDER — POTASSIUM CHLORIDE CRYS ER 20 MEQ PO TBCR: 40.0000 meq | EXTENDED_RELEASE_TABLET | ORAL | Status: DC

## 2023-10-06 MED ORDER — POTASSIUM CHLORIDE 10 MEQ/100ML IV SOLN
10.0000 meq | INTRAVENOUS | Status: AC
  Administered 2023-10-06 (×3): 10 meq via INTRAVENOUS
  Filled 2023-10-06 (×3): qty 100

## 2023-10-06 MED ORDER — LINEZOLID 600 MG/300ML IV SOLN
600.0000 mg | Freq: Two times a day (BID) | INTRAVENOUS | Status: DC
  Administered 2023-10-06 – 2023-10-07 (×2): 600 mg via INTRAVENOUS
  Filled 2023-10-06 (×4): qty 300

## 2023-10-06 MED ORDER — POTASSIUM CHLORIDE 20 MEQ PO PACK
40.0000 meq | PACK | ORAL | Status: DC
  Filled 2023-10-06: qty 2

## 2023-10-06 MED ORDER — MIDODRINE HCL 5 MG PO TABS
5.0000 mg | ORAL_TABLET | Freq: Three times a day (TID) | ORAL | Status: DC
  Administered 2023-10-06 – 2023-10-07 (×4): 5 mg via ORAL
  Filled 2023-10-06 (×5): qty 1

## 2023-10-06 MED ORDER — FUROSEMIDE 10 MG/ML IJ SOLN
40.0000 mg | Freq: Once | INTRAMUSCULAR | Status: AC
Start: 2023-10-06 — End: 2023-10-06
  Administered 2023-10-06: 40 mg via INTRAVENOUS
  Filled 2023-10-06: qty 4

## 2023-10-06 NOTE — Consult Note (Signed)
 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 male  with past medical history of HFrEF (EF 30%), PAF on Eliquis  (recently on hold for rectal bleeding), CKD stage IIIa with left hydronephrosis, HTN, BPH, depression/anxiety, chronic pain with narcotic dependence admitted on 10/05/2023 with dyspnea.   Patient was recently hospitalized twice: 3/31 >> 4/11: acute hypoxic respiratory failure due to COVID-19 pneumonia.  Subsequently developed A-fib with RVR then low output cardiogenic shock requiring milrinone .  Found to have EF 20-25%.   4/30 >> 5/6: BRBPR and hematochezia from rectal ulceration seen on colonoscopy.  Felt secondary to stercoral ulceration.  Biopsy negative for significant pathology.  During admission there was also concern for aspiration pneumonia versus pneumonitis. He was discharged to [facility] on 1 week Augmentin .  He is now admitted for acute hypoxic respiratory failure secondary to PNA. Started on BiPAP and having difficulty coming off.  PMT has been consulted to assist with goals of care conversation.  Clinical Assessment and Goals of Care:  I have reviewed medical records including EPIC notes, labs and imaging, assessed the patient and then had a phone conversation with patient's wife Brian Ruiz to discuss diagnosis prognosis, GOC, EOL wishes, disposition and options.  I introduced Palliative Medicine as specialized medical care for people living with serious illness. It focuses on providing relief from the symptoms and stress of a serious illness. The goal is to improve quality of life for both the patient and the family.  We discussed a brief life review of the patient and then focused on their current  illness.   I attempted to elicit values and goals of care important to the patient.    Medical History Review and Understanding:  Patient nods his understanding of the severity of his illness. His wife received update from MD today and also understands.  Advance Directives: A detailed discussion regarding advanced directives was had. MOST form on file was reviewed.   Discussion: Patient has a difficult time communicating, as he was placed back on BiPAP, but does nod his head yes/no and gesture with his hands. He is familiar with palliative medicine from previous experience with someone he knows but not personally. He is agreeable to palliative involvement after explanation of the role as an extra layer of support. Patient shares his wife just stepped out and gives this PA permission to call her.  During my call with patient's wife, she shares that she was expecting PMT to reach out however she is quite emotional and waiting for her son to call her. She is having a difficult time figuring out how to share the concerning updates received from the medical team and patient's overall situation/prognosis. I offered to be present for the conversation and assist, however she prefers to speak with her children independently and then reach out to PMT to schedule a family meeting for further discussion after additional time for treatment. Emotional support and therapeutic listening was provided. PMT contact information was provided.    Discussed the importance of continued conversation with family and the medical providers regarding overall plan of care and treatment options, ensuring decisions are within the context of the patient's values and GOCs.   Questions and concerns were addressed. The family was encouraged to call with questions or concerns.  PMT will continue to support holistically.    SUMMARY OF RECOMMENDATIONS   -Continue DNR/DNI -Continue current care plan -Patient's wife would like to  speak with her children before a family meeting, will inform PMT of preferred date/time after updating them -PMT will continue to follow and support   Prognosis:  Tenuous with high risk for further decline and decompensation  Discharge Planning: To Be Determined      Primary Diagnoses: Present on Admission:  Severe sepsis (HCC)  Acute respiratory failure with hypoxia (HCC)  Stage 3a chronic kidney disease (CKD) (HCC)  Chronic HFrEF (heart failure with reduced ejection fraction) (HCC)  Chronic pain syndrome  Depression with anxiety  Elevated troponin  BPH (benign prostatic hyperplasia)    Physical Exam Vitals and nursing note reviewed.  Constitutional:      General: He is not in acute distress.    Appearance: He is ill-appearing.  Cardiovascular:     Rate and Rhythm: Normal rate.  Pulmonary:     Comments: BiPAP in place Neurological:     Mental Status: He is alert.     Comments: Difficulty speaking due to BiPAP  Psychiatric:        Behavior: Behavior normal.     Vital Signs: BP 119/63 (BP Location: Left Arm)   Pulse 73   Temp 99.3 F (37.4 C) (Oral)   Resp (!) 24   Ht 5\' 11"  (1.803 m)   Wt 72.9 kg   SpO2 92%   BMI 22.42 kg/m  Pain Scale: 0-10   Pain Score: 8    SpO2: SpO2: 92 % O2 Device:SpO2: 92 % O2 Flow Rate: .O2 Flow Rate (L/min): 15 L/min    Dezirae Service P Tawanna Funk, PA-C  Palliative Medicine Team Team phone # [phone]  Thank you for allowing the Palliative Medicine Team to assist in the care of this patient. Please utilize secure chat with additional questions, if there is no response within 30 minutes please call the above phone number.  Palliative Medicine Team providers are available by phone from 7am to 7pm daily and can be reached through the team cell phone.  Should this patient require assistance outside of these hours, please call the patient's attending physician.

## 2023-10-06 NOTE — Progress Notes (Signed)
 Patient taken off BiPAP and placed on 15L [place]. Tolerating well at this time.

## 2023-10-06 NOTE — Progress Notes (Signed)
 Arlin Benes rm 1B14 [facility] Liaison Note:  This is a current Midwife patient with [facility]. will continue to follow for discharge disposition. Please call with any Integrated Health Services related questions or concerns.  Thank you, Dwane Gitelman, BSN, RN Hospice hospital liaison [phone]

## 2023-10-06 NOTE — Progress Notes (Signed)
 Pending MRSA PCR, ok to transition vanc to linezolid 600mg  IV BID due to CKD per Dr. Hilton Lucky.  Ivery Marking, PharmD, BCIDP, AAHIVP, CPP Infectious Disease Pharmacist 10/06/2023 8:13 AM

## 2023-10-06 NOTE — Plan of Care (Signed)

## 2023-10-06 NOTE — Evaluation (Signed)
 Clinical/Bedside Swallow Evaluation Patient Details  Name: Brian Ruiz MRN: [id number] Date of Birth: 10/28/1945  Today's Date: 10/06/2023 Time: SLP Start Time (ACUTE ONLY): 1003 SLP Stop Time (ACUTE ONLY): 1011 SLP Time Calculation (min) (ACUTE ONLY): 8 min  Past Medical History:  Past Medical History:  Diagnosis Date   Arthritis    Back pain, chronic    intolerant to narcotics, avoids NSAIDs due to vioxx related bleed,  sees pain clinic as [place], prior eval by [facility] neuro in [place]   Cholecystitis    Choledocholithiasis    Chronic diastolic CHF (congestive heart failure) (HCC) 05/16/2018   Per Dr Letta Raw note in 2019, pt did not have heart failure but hypertensive heart disease.  Azzie Bollman FNP-C   Encephalopathy acute 05/15/2011   Heart murmur    Hypertension    Inguinal hernia    Polyneuropathy    S/P thyroidectomy 1967   Scoliosis    Urinary retention    Past Surgical History:  Past Surgical History:  Procedure Laterality Date   BACK SURGERY  1975   [id number]   BONE BIOPSY  09/30/2023   Procedure: BIOPSY, GI;  Surgeon: Nannette Babe, MD;  Location: [facility] ENDOSCOPY;  Service: Gastroenterology;;   CARDIOVERSION N/A 09/04/2023   Procedure: CARDIOVERSION;  Surgeon: Darlis Eisenmenger, MD;  Location: [facility] INVASIVE CV LAB;  Service: Cardiovascular;  Laterality: N/A;   CHOLECYSTECTOMY N/A 11/13/2020   Procedure: LAPAROSCOPIC CHOLECYSTECTOMY WITH INTRAOPERATIVE CHOLANGIOGRAM;  Surgeon: Dorena Gander, MD;  Location: [facility] OR;  Service: General;  Laterality: N/A;   COLONOSCOPY     COLONOSCOPY N/A 09/29/2023   Procedure: COLONOSCOPY;  Surgeon: Nannette Babe, MD;  Location: [facility] ENDOSCOPY;  Service: Gastroenterology;  Laterality: N/A;   COLONOSCOPY N/A 09/30/2023   Procedure: COLONOSCOPY;  Surgeon: Nannette Babe, MD;  Location: [facility] ENDOSCOPY;  Service: Gastroenterology;  Laterality: N/A;   FOOT SURGERY  1985, 2000   due to arthritis   HAND SURGERY  2005   left - due to arthritis   INGUINAL  HERNIA REPAIR  04/30/2012   Procedure: HERNIA REPAIR INGUINAL ADULT;  Surgeon: Darcella Earnest, MD;  Location: [facility];  Service: General;  Laterality: Left;  repair left inguinal hernia   POLYPECTOMY  09/30/2023   Procedure: POLYPECTOMY, INTESTINE;  Surgeon: Nannette Babe, MD;  Location: [facility] ENDOSCOPY;  Service: Gastroenterology;;   RIGHT/LEFT HEART CATH AND CORONARY ANGIOGRAPHY N/A 09/01/2023   Procedure: RIGHT/LEFT HEART CATH AND CORONARY ANGIOGRAPHY;  Surgeon: Lauralee Poll, MD;  Location: [facility] INVASIVE CV LAB;  Service: Cardiovascular;  Laterality: N/A;   THYROIDECTOMY  1967   TONSILLECTOMY     TRANSESOPHAGEAL ECHOCARDIOGRAM (CATH LAB) N/A 09/04/2023   Procedure: TRANSESOPHAGEAL ECHOCARDIOGRAM;  Surgeon: Darlis Eisenmenger, MD;  Location: [facility] INVASIVE CV LAB;  Service: Cardiovascular;  Laterality: N/A;   HPI:  Brian Ruiz is a 78 yo male presenting to ED from [facility] 5/8 with shortness of breath. Admitted with acute hypoxic respiratory failure secondary to PNA, intermittently requiring BiPAP. CXR shows patchy L lung opacities. Recently admitted 3/31 for acute hypoxic respiratory failure due to COVID PNA and 4/30 with hematochezia from rectal ulceration. Seen by SLP 5/4 with complaints of globus sensation and signs of esophageal dysphagia. PMH includes dementia, HFrEF, PAF on Eliquis , CKD 3A with L hydronephrosis, HTN, BPH, anxiety/depression, chronic pain with narcotic dependence    Assessment / Plan / Recommendation  Clinical Impression  Pt denies acute changes with swallowing and states he has experienced  a globus sensation with pills and solids. Both the pt and his wife attribute this to recent TEE without noted symptoms prior. No s/s of dysphagia or aspiration were observed with trials of thin liquids or solids. Recommend initiating regular diet with thin liquids. Provided education regarding esophageal precuations. Given acute respiratory status with intermittent use of BiPAP,  SLP will f/u at least briefly to ensure regular diet remains most appropriate but do not anticipate post-acute needs. SLP Visit Diagnosis: Dysphagia, unspecified (R13.10)    Aspiration Risk  Mild aspiration risk    Diet Recommendation Regular;Thin liquid    Liquid Administration via: Cup;Straw Medication Administration: Whole meds with puree Supervision: Patient able to self feed Compensations: Slow rate;Small sips/bites Postural Changes: Seated upright at 90 degrees;Remain upright for at least 30 minutes after po intake    Other  Recommendations Recommended Consults: Consider esophageal assessment Oral Care Recommendations: Oral care BID    Recommendations for follow up therapy are one component of a multi-disciplinary discharge planning process, led by the attending physician.  Recommendations may be updated based on patient status, additional functional criteria and insurance authorization.  Follow up Recommendations No SLP follow up      Assistance Recommended at Discharge    Functional Status Assessment Patient has had a recent decline in their functional status and demonstrates the ability to make significant improvements in function in a reasonable and predictable amount of time.  Frequency and Duration min 2x/week  1 week       Prognosis Prognosis for improved oropharyngeal function: Good Barriers to Reach Goals: Cognitive deficits      Swallow Study   General HPI: Brian Ruiz is a 78 yo male presenting to ED from [facility] 5/8 with shortness of breath. Admitted with acute hypoxic respiratory failure secondary to PNA, intermittently requiring BiPAP. CXR shows patchy L lung opacities. Recently admitted 3/31 for acute hypoxic respiratory failure due to COVID PNA and 4/30 with hematochezia from rectal ulceration. Seen by SLP 5/4 with complaints of globus sensation and signs of esophageal dysphagia. PMH includes dementia, HFrEF, PAF on Eliquis , CKD 3A with L hydronephrosis, HTN,  BPH, anxiety/depression, chronic pain with narcotic dependence Type of Study: Bedside Swallow Evaluation Previous Swallow Assessment: see HPI Diet Prior to this Study: NPO Temperature Spikes Noted: No Respiratory Status: Nasal cannula (15L HFNC) History of Recent Intubation: No Behavior/Cognition: Alert;Cooperative Oral Cavity Assessment: Within Functional Limits Oral Care Completed by SLP: No Oral Cavity - Dentition: Adequate natural dentition Vision: Functional for self-feeding Self-Feeding Abilities: Able to feed self Patient Positioning: Upright in bed Baseline Vocal Quality: Normal Volitional Cough: Strong Volitional Swallow: Able to elicit    Oral/Motor/Sensory Function Overall Oral Motor/Sensory Function: Within functional limits   Ice Chips Ice chips: Not tested   Thin Liquid Thin Liquid: Within functional limits Presentation: Straw;Self Fed    Nectar Thick Nectar Thick Liquid: Not tested   Honey Thick Honey Thick Liquid: Not tested   Puree Puree: Not tested   Solid     Solid: Within functional limits Presentation: Self Fed      Amil Kale, M.A., CCC-SLP Speech Language Pathology, Acute Rehabilitation Services  Secure Chat preferred [phone]  10/06/2023,11:01 AM

## 2023-10-06 NOTE — Progress Notes (Signed)
   10/06/23 2202  BiPAP/CPAP/SIPAP  $ Non-Invasive Ventilator  Non-Invasive Vent Subsequent  BiPAP/CPAP/SIPAP Pt Type Adult  BiPAP/CPAP/SIPAP SERVO  Mask Type Full face mask  Mask Size Medium  Set Rate 8 breaths/min  Respiratory Rate 28 breaths/min  IPAP 13 cmH20  EPAP 7 cmH2O  FiO2 (%) 80 %  Minute Ventilation 11.3  Leak 62  Peak Inspiratory Pressure (PIP) 16  Tidal Volume (Vt) 518  Patient Home Machine No  Patient Home Mask No  Patient Home Tubing No  Auto Titrate No  BiPAP/CPAP /SiPAP Vitals  Resp 20  SpO2 95 %  Bilateral Breath Sounds Diminished  MEWS Score/Color  MEWS Score 0  MEWS Score Color Marrie Sizer

## 2023-10-06 NOTE — Progress Notes (Addendum)
 PROGRESS NOTE        PATIENT DETAILS Name: Brian Ruiz Age: 78 y.o. Sex: male Date of Birth: [birth year] Admit Date: 10/05/2023 Admitting Physician Kenny Peals, MD UJW:[id number], [facility]  Brief Summary: Patient is a 78 y.o.  male with history of HFrEF, PAF, CKD stage IIIa, HTN, BPH, narcotic dependence-presented from SNF for fever/shortness of breath-found to have acute hypoxic respiratory failure secondary to PNA.  Started on BiPAP and admitted to the hospitalist service.  Significant events: 3/31-4/11>> hospitalization for hypoxia-secondary to HFrEF exacerbation with low output state/COVID-19 pneumonia. 4/30-5/6>> hospitalization for stercoral ulceration-bright red blood per rectum-possible aspiration pneumonia. 5/8>> admit to [facility] from SNF-fever/SOB-requiring BiPAP.  Empiric antibiotics-admit to PCU.  Significant studies: 4/3>> TTE: EF 35-40%. 4/4>> LHC: Normal coronary angiography. 5/3>> colonoscopy: Ulcerated mucosa proximal rectum/rectosigmoid-moderate diverticulosis.  Polyp in ascending colon/sigmoid colon. 5/5>> patchy opacities-left lung-chronic right hemidiaphragm.  Significant microbiology data: 5/8>> COVID/influenza/RSV PCR: Negative 5/8>> blood culture: No growth 5/8>> urine culture: Pending  Procedures: None  Consults: Pall care  Subjective: On BiPAP since admission-able to talk-feels better-subsequently after talking with RT-he was liberated off BiPAP and placed on 15 L of HFNC.  Keep BiPAP at bedside.  Looks very frail.  Continues to have cough.  Objective: Vitals: Blood pressure 119/63, pulse 73, temperature 99.3 F (37.4 C), temperature source Oral, resp. rate (!) 24, height 5\' 11"  (1.803 m), weight 72.9 kg, SpO2 92%.   Exam: Gen Exam: Incredibly frail appearing-not in any distress.  Awake/alert. HEENT:atraumatic, normocephalic Chest: Diffuse bibasilar rales-extending up to mid lungs. CVS:S1S2 regular Abdomen:soft non  tender, non distended Extremities:no edema Neurology: Non focal Skin: no rash  Pertinent Labs/Radiology:    Latest Ref Rng & Units 10/05/2023   11:07 PM 10/05/2023    4:51 PM 10/03/2023   10:07 AM  CBC  WBC 4.0 - 10.5 K/uL 32.4  25.9  18.3   Hemoglobin 13.0 - 17.0 g/dL 62.1  30.8  9.9   Hematocrit 39.0 - 52.0 % 31.5  34.9  30.0   Platelets 150 - 400 K/uL 222  251  250     Lab Results  Component Value Date   NA 136 10/05/2023   K 3.0 (L) 10/05/2023   CL 99 10/05/2023   CO2 24 10/05/2023     Assessment/Plan: Acute hypoxic respiratory failure-likely secondary to PNA Although improved-remains tenuous Liberated off BiPAP this morning-on 15 L HFNC Keep BiPAP at bedside Continue empiric antibiotics-supportive care Await SLP evaluation Needs goals of care discussion-this is his third hospitalization in a span of 2-3 months-overall prognosis is poor.  Severe sepsis-likely secondary to PNA+/-UTI (POA-severe leukocytosis-hypoxic/febrile) Sepsis physiology somewhat better-afebrile-hypoxia improved Awaiting cultures Continue Zyvox/cefepime /Flagyl  in the interim-will narrow based on culture data. BP soft-starting low-dose midodrine for now.  Will wean as he improves.  Addendum Developed acute urinary retention-1 L of urine obtained immediately when Foley catheter placed-per nursing staff-urine appeared purulent.  Urine cultures are pending.  CKD stage IIIa Creatinine at baseline Follow closely  BPH Flomax  Frequent bladder scan-if any signs of retention-given how sick he is-will need a Foley catheter.  Recent imaging consistent on prior admit-consistent with mild left-sided hydronephrosis This was seen incidentally on CT PE study. Given severity of illness-checking CT renal stone study.  Elevated troponins Secondary to  demand ischemia Recent LHC negative Given his overall clinical situation-do not think he is a  candidate for aggressive care at this point.  PAF Continue  amiodarone  Per discharge summary-Eliquis  to resume on 5/10-held for GI bleeding.  Chronic HFrEF No peripheral edema-in fact appears on the dry side. Suspect not on GDMT meds apart from diuretics due to soft blood pressure Will dose diuretics as needed-to keep in negative balance.  Hypokalemia Replete/recheck  Dementia Supportive care Maintain delirium precautions  Chronic pain syndrome-narcotic dependence As needed narcotics for now.  Palliative care DNR in place Poor overall prognosis-recurrent hospitalization-will discuss with family if they are ready to transition to hospice care.  Palliative care consulted as well. Do not think he is a good candidate for any further escalation in care.  The patient is critically ill with multiple organ system failure and requires high complexity decision making for assessment and support, frequent evaluation and titration of therapies, advanced monitoring, review of radiographic studies and interpretation of complex data.   Total Critical time spent equals 45 minutes   Code status:   Code Status: Limited: Do not attempt resuscitation (DNR) -DNR-LIMITED -Do Not Intubate/DNI    DVT Prophylaxis: SCDs Start: 10/05/23 2019   Family Communication: Spouse-[name] -updated over the phone on 5/9-understands tenuous situation- confirms DNR.  Understands that if patient were to deteriorate-apart from initiating hospice care-no further role in escalation of care.  She is aware of pending palliative care evaluation.   Disposition Plan: Status is: Inpatient Remains inpatient appropriate because: Severity of illness   Planned Discharge Destination:Skilled nursing facility   Diet: Diet Order             Diet NPO time specified Except for: Sips with Meds  Diet effective now                     Antimicrobial agents: Anti-infectives (From admission, onward)    Start     Dose/Rate Route Frequency Ordered Stop   10/06/23 2200   linezolid (ZYVOX) IVPB 600 mg        600 mg 300 mL/hr over 60 Minutes Intravenous Every 12 hours 10/06/23 0812     10/06/23 2045  vancomycin  (VANCOCIN ) IVPB 1000 mg/200 mL premix  Status:  Discontinued        1,000 mg 200 mL/hr over 60 Minutes Intravenous Every 24 hours 10/05/23 2033 10/06/23 0812   10/06/23 0800  ceFEPIme  (MAXIPIME ) 2 g in sodium chloride  0.9 % 100 mL IVPB        2 g 200 mL/hr over 30 Minutes Intravenous Every 12 hours 10/05/23 2033     10/05/23 2030  metroNIDAZOLE  (FLAGYL ) IVPB 500 mg        500 mg 100 mL/hr over 60 Minutes Intravenous Every 12 hours 10/05/23 2022 10/12/23 2029   10/05/23 1845  vancomycin  (VANCOCIN ) IVPB 1000 mg/200 mL premix  Status:  Discontinued        1,000 mg 200 mL/hr over 60 Minutes Intravenous  Once 10/05/23 1831 10/05/23 1837   10/05/23 1845  ceFEPIme  (MAXIPIME ) 2 g in sodium chloride  0.9 % 100 mL IVPB        2 g 200 mL/hr over 30 Minutes Intravenous  Once 10/05/23 1831 10/05/23 1955   10/05/23 1845  vancomycin  (VANCOREADY) IVPB 1500 mg/300 mL        1,500 mg 150 mL/hr over 120 Minutes Intravenous  Once 10/05/23 1837 10/05/23 2210        MEDICATIONS: Scheduled Meds:  amiodarone   200 mg Oral Daily   guaiFENesin   600 mg Oral BID  HYDROmorphone  (DILAUDID ) injection  1 mg Intravenous Q8H   HYDROmorphone   4 mg Oral Q8H   midodrine  5 mg Oral TID WC   polyethylene glycol  17 g Oral Daily   potassium chloride   40 mEq Oral Once   sertraline   25 mg Oral TID   sodium chloride  flush  3 mL Intravenous Q12H   tamsulosin   0.4 mg Oral QPC breakfast   Continuous Infusions:  ceFEPime  (MAXIPIME ) IV 2 g (10/06/23 0827)   linezolid (ZYVOX) IV     metronidazole  500 mg (10/06/23 0822)   PRN Meds:.acetaminophen  **OR** acetaminophen , bisacodyl , levalbuterol , ondansetron  **OR** ondansetron  (ZOFRAN ) IV, senna-docusate   I have personally reviewed following labs and imaging studies  LABORATORY DATA: CBC: Recent Labs  Lab 10/01/23 0843  10/02/23 0601 10/03/23 1007 10/05/23 1651 10/05/23 2307  WBC 15.0* 15.8* 18.3* 25.9* 32.4*  NEUTROABS  --   --   --  23.2*  --   HGB 10.0* 9.7* 9.9* 11.1* 10.4*  HCT 31.1* 30.1* 30.0* 34.9* 31.5*  MCV 94.8 93.5 92.0 94.1 93.2  PLT 365 299 250 251 222    Basic Metabolic Panel: Recent Labs  Lab 09/29/23 1007 09/29/23 1054 10/01/23 0843 10/02/23 0601 10/03/23 1007 10/05/23 1651 10/05/23 2307  NA  --    < > 140 135 133* 133* 136  K  --    < > 2.8* 3.3* 3.5 3.3* 3.0*  CL  --    < > 103 101 97* 95* 99  CO2  --    < > 27 25 26 23 24   GLUCOSE  --    < > 109* 93 99 114* 115*  BUN  --    < > 13 13 17 21 23   CREATININE  --    < > 1.43* 1.16 1.35* 1.52* 1.58*  CALCIUM  --    < > 9.0 8.7* 8.4* 8.9 8.6*  MG 2.3  --   --  2.0  --   --   --    < > = values in this interval not displayed.    GFR: Estimated Creatinine Clearance: 40.4 mL/min (A) (by C-G formula based on SCr of 1.58 mg/dL (H)).  Liver Function Tests: Recent Labs  Lab 10/05/23 1651 10/05/23 2307  AST 28 23  ALT 20 19  ALKPHOS 62 52  BILITOT 0.8 1.0  PROT 6.2* 5.7*  ALBUMIN  2.6* 2.4*   No results for input(s): "LIPASE", "AMYLASE" in the last 168 hours. No results for input(s): "AMMONIA" in the last 168 hours.  Coagulation Profile: No results for input(s): "INR", "PROTIME" in the last 168 hours.  Cardiac Enzymes: No results for input(s): "CKTOTAL", "CKMB", "CKMBINDEX", "TROPONINI" in the last 168 hours.  BNP (last 3 results) No results for input(s): "PROBNP" in the last 8760 hours.  Lipid Profile: No results for input(s): "CHOL", "HDL", "LDLCALC", "TRIG", "CHOLHDL", "LDLDIRECT" in the last 72 hours.  Thyroid Function Tests: No results for input(s): "TSH", "T4TOTAL", "FREET4", "T3FREE", "THYROIDAB" in the last 72 hours.  Anemia Panel: No results for input(s): "VITAMINB12", "FOLATE", "FERRITIN", "TIBC", "IRON", "RETICCTPCT" in the last 72 hours.  Urine analysis:    Component Value Date/Time    COLORURINE AMBER (A) 10/05/2023 2112   APPEARANCEUR TURBID (A) 10/05/2023 2112   LABSPEC 1.011 10/05/2023 2112   PHURINE 5.0 10/05/2023 2112   GLUCOSEU NEGATIVE 10/05/2023 2112   HGBUR MODERATE (A) 10/05/2023 2112   BILIRUBINUR NEGATIVE 10/05/2023 2112   KETONESUR NEGATIVE 10/05/2023 2112   PROTEINUR 30 (A)  10/05/2023 2112   UROBILINOGEN 0.2 05/15/2011 1151   NITRITE NEGATIVE 10/05/2023 2112   LEUKOCYTESUR LARGE (A) 10/05/2023 2112    Sepsis Labs: Lactic Acid, Venous    Component Value Date/Time   LATICACIDVEN 1.7 10/05/2023 1900    MICROBIOLOGY: Recent Results (from the past 240 hours)  Culture, blood (routine x 2)     Status: None (Preliminary result)   Collection Time: 10/05/23  4:50 PM   Specimen: BLOOD  Result Value Ref Range Status   Specimen Description BLOOD RIGHT ANTECUBITAL  Final   Special Requests   Final    BOTTLES DRAWN AEROBIC AND ANAEROBIC Blood Culture adequate volume   Culture   Final    NO GROWTH < 24 HOURS Performed at [facility], [address] [place], [place] [address]    Report Status PENDING  Incomplete  Resp panel by RT-PCR (RSV, Flu A&B, Covid) Anterior Nasal Swab     Status: None   Collection Time: 10/05/23  8:17 PM   Specimen: Anterior Nasal Swab  Result Value Ref Range Status   SARS Coronavirus 2 by RT PCR NEGATIVE NEGATIVE Final   Influenza A by PCR NEGATIVE NEGATIVE Final   Influenza B by PCR NEGATIVE NEGATIVE Final    Comment: (NOTE) The Xpert Xpress SARS-CoV-2/FLU/RSV plus assay is intended as an aid in the diagnosis of influenza from Nasopharyngeal swab specimens and should not be used as a sole basis for treatment. Nasal washings and aspirates are unacceptable for Xpert Xpress SARS-CoV-2/FLU/RSV testing.  Fact Sheet for Patients: BloggerCourse.com  Fact Sheet for Healthcare Providers: SeriousBroker.it  This test is not yet approved or cleared by the [place]  FDA  and has been authorized for detection and/or diagnosis of SARS-CoV-2 by FDA under an Emergency Use Authorization (EUA). This EUA will remain in effect (meaning this test can be used) for the duration of the COVID-19 declaration under Section 564(b)(1) of the Act, 21 U.S.C. section 360bbb-3(b)(1), unless the authorization is terminated or revoked.     Resp Syncytial Virus by PCR NEGATIVE NEGATIVE Final    Comment: (NOTE) Fact Sheet for Patients: BloggerCourse.com  Fact Sheet for Healthcare Providers: SeriousBroker.it  This test is not yet approved or cleared by the [place]  FDA and has been authorized for detection and/or diagnosis of SARS-CoV-2 by FDA under an Emergency Use Authorization (EUA). This EUA will remain in effect (meaning this test can be used) for the duration of the COVID-19 declaration under Section 564(b)(1) of the Act, 21 U.S.C. section 360bbb-3(b)(1), unless the authorization is terminated or revoked.  Performed at [facility], [address] [place], [place] [address]   Culture, blood (routine x 2)     Status: None (Preliminary result)   Collection Time: 10/05/23 11:07 PM   Specimen: BLOOD LEFT HAND  Result Value Ref Range Status   Specimen Description BLOOD LEFT HAND  Final   Special Requests   Final    BOTTLES DRAWN AEROBIC AND ANAEROBIC Blood Culture adequate volume   Culture   Final    NO GROWTH < 12 HOURS Performed at [facility], [address], [place], [place] [address]    Report Status PENDING  Incomplete    RADIOLOGY STUDIES/RESULTS: DG Chest Port 1 View Result Date: 10/05/2023 CLINICAL DATA:  Shortness of breath. EXAM: PORTABLE CHEST 1 VIEW COMPARISON:  CTA chest dated 10/02/2023. Chest radiograph dated 10/01/2023. FINDINGS: Chronic elevation of the right hemidiaphragm. Stable cardiomediastinal silhouette. Patchy opacities again noted in  the left lung. Bibasilar atelectasis.  No pneumothorax. No acute osseous abnormality. Gas distended loops of bowel in the right upper quadrant. IMPRESSION: 1. Similar patchy opacities in the left lung. 2. Bibasilar atelectasis. 3. Gas distended loops of bowel in the right upper quadrant. Electronically Signed   By: Mannie Seek M.D.   On: 10/05/2023 17:36     LOS: 1 day   Kimberly Penna, MD  [facility]    To contact the attending provider between 7A-7P or the covering provider during after hours 7P-7A, please log into the web site [email or web address] and access using universal [place] password for that web site. If you do not have the password, please call the hospital operator.  10/06/2023, 8:59 AM

## 2023-10-06 NOTE — TOC Initial Note (Signed)
 Transition of Care [facility]) - Initial/Assessment Note    Patient Details  Name: Brian Ruiz MRN: [id number] Date of Birth: May 29, 1946  Transition of Care [facility]) CM/SW Contact:    Jannice Mends, LCSW Phone Number: 10/06/2023, 4:11 PM  Clinical Narrative:                 Patient admitted from [facility] where he was undergoing rehab. Currently on 40L [facility]. CSW will continue to follow.     Expected Discharge Plan: Skilled Nursing Facility Barriers to Discharge: Continued Medical Work up   Patient Goals and CMS Choice            Expected Discharge Plan and Services In-house Referral: Clinical Social Work     Living arrangements for the past 2 months: Skilled Nursing Facility                                      Prior Living Arrangements/Services Living arrangements for the past 2 months: Skilled Nursing Facility   Patient language and need for interpreter reviewed:: Yes Do you feel safe going back to the place where you live?: Yes      Need for Family Participation in Patient Care: Yes (Comment) Care giver support system in place?: Yes (comment) Current home services: DME ([facility], [facility]) Criminal Activity/Legal Involvement Pertinent to Current Situation/Hospitalization: No - Comment as needed  Activities of Daily Living   ADL Screening (condition at time of admission) Independently performs ADLs?: No Does the patient have a NEW difficulty with bathing/dressing/toileting/self-feeding that is expected to last >3 days?: No Does the patient have a NEW difficulty with getting in/out of bed, walking, or climbing stairs that is expected to last >3 days?: No Does the patient have a NEW difficulty with communication that is expected to last >3 days?: No Is the patient deaf or have difficulty hearing?: No Does the patient have difficulty seeing, even when wearing glasses/contacts?: No Does the patient have difficulty concentrating, remembering, or making decisions?:  Yes  Permission Sought/Granted Permission sought to share information with : Facility Medical sales representative, Family Supports    Share Information with NAME: Willetta Harpin  Permission granted to share info w AGENCY: [place]  Permission granted to share info w Relationship: Spouse  Permission granted to share info w Contact Information: [phone]  Emotional Assessment Appearance:: Appears stated age Attitude/Demeanor/Rapport: Unable to Assess Affect (typically observed): Unable to Assess Orientation: : Oriented to Self, Oriented to Place Alcohol / Substance Use: Not Applicable Psych Involvement: No (comment)  Admission diagnosis:  HCAP (healthcare-associated pneumonia) [J18.9] Severe sepsis (HCC) [A41.9, R65.20] Severe comorbid illness [R69] Patient Active Problem List   Diagnosis Date Noted   Severe sepsis (HCC) 10/05/2023   Stage 3a chronic kidney disease (CKD) (HCC) 10/05/2023   Chronic HFrEF (heart failure with reduced ejection fraction) (HCC) 10/05/2023   Elevated troponin 10/05/2023   Benign neoplasm of sigmoid colon 09/30/2023   Benign neoplasm of ascending colon 09/30/2023   Colon ulcer 09/30/2023   ABLA (acute blood loss anemia) 09/28/2023   Gastroesophageal reflux disease 09/27/2023   Rectal bleeding 09/27/2023   NSTEMI (non-ST elevated myocardial infarction) (HCC) 08/31/2023   Acute metabolic encephalopathy 08/30/2023   Acute respiratory failure with hypoxia (HCC) 08/28/2023   COVID-19 virus infection 08/28/2023   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   Depression with anxiety 09/12/2021   Palliative care encounter 08/03/2021   Physical debility 08/03/2021  Severe protein-calorie malnutrition (HCC) 08/03/2021   Gastrointestinal hemorrhage with melena 06/13/2019   Acute on chronic combined systolic and diastolic CHF (congestive heart failure) (HCC) 05/16/2018   Chronic fatigue 06/14/2017   Palpitations 06/14/2017   Aortic ectasia (HCC)  11/15/2016   Age-related osteoporosis without current pathological fracture 10/27/2016   Screening for AAA (abdominal aortic aneurysm) 10/27/2016   Screening for prostate cancer 10/27/2016   Need for hepatitis C screening test 10/27/2016   Encounter for general adult medical examination without abnormal findings 11/26/2013   Screening for osteoporosis 11/26/2013   Kyphoscoliosis deformity of spine 07/04/2013   BPH (benign prostatic hyperplasia) 04/12/2013   Screen for colon cancer 04/12/2013   Undiagnosed cardiac murmurs 04/12/2013   Backache 04/12/2013   White coat syndrome with hypertension 04/12/2013   Lumbar spinal stenosis 10/29/2012   DDD (degenerative disc disease), lumbosacral 03/16/2012   Lumbosacral radiculopathy 03/16/2012   Generalized anxiety disorder 03/14/2012   Renal pain 12/16/2011   Sacroiliitis, not elsewhere classified (HCC) 08/11/2011   Sacroiliac joint pain 07/27/2011   Bilateral hip pain 07/07/2011   Chronic pain syndrome 07/07/2011   Spondylolisthesis of lumbosacral region 07/07/2011   Hereditary and idiopathic peripheral neuropathy 05/27/2011   Accidental drug overdose 05/15/2011   Guilty feelings 05/15/2011   Essential hypertension 05/15/2011   Hypotension 05/15/2011   Bradycardia 05/15/2011   Chronic back pain 05/15/2011   Vitamin D deficiency 03/08/2011   PCP:  Financial risk analyst, [facility]:   Melodee Spruce [facility] [address] [place] [address] Phone: [phone] Fax: [phone]  [facility] [address] [place] [address] Phone: [phone] Fax: [phone]     Social Drivers of Health (SDOH) Social History: SDOH Screenings   Food Insecurity: No Food Insecurity (10/05/2023)  Housing: Low Risk  (10/05/2023)  Transportation Needs: No Transportation Needs (10/05/2023)  Utilities: Not At Risk (10/05/2023)  Financial Resource Strain: Low Risk  (02/08/2021)   Received from  [facility], [facility]  Physical Activity: Inactive (02/08/2021)   [facility] from [facility], [facility]  Social Connections: Moderately Isolated (09/28/2023)  Stress: No Stress Concern Present (02/08/2021)   [facility]  Tobacco Use: Low Risk  (09/30/2023)   SDOH Interventions:     Readmission Risk Interventions    08/30/2023   12:30 PM  Readmission Risk Prevention Plan  Post Dischage Appt Complete  Medication Screening Complete  Transportation Screening Complete

## 2023-10-07 ENCOUNTER — Inpatient Hospital Stay (HOSPITAL_COMMUNITY)

## 2023-10-07 DIAGNOSIS — Z7189 Other specified counseling: Secondary | ICD-10-CM

## 2023-10-07 DIAGNOSIS — G894 Chronic pain syndrome: Secondary | ICD-10-CM | POA: Diagnosis not present

## 2023-10-07 DIAGNOSIS — J9601 Acute respiratory failure with hypoxia: Secondary | ICD-10-CM | POA: Diagnosis not present

## 2023-10-07 DIAGNOSIS — B3749 Other urogenital candidiasis: Secondary | ICD-10-CM

## 2023-10-07 DIAGNOSIS — N1831 Chronic kidney disease, stage 3a: Secondary | ICD-10-CM | POA: Diagnosis not present

## 2023-10-07 DIAGNOSIS — J189 Pneumonia, unspecified organism: Secondary | ICD-10-CM | POA: Diagnosis not present

## 2023-10-07 DIAGNOSIS — I5022 Chronic systolic (congestive) heart failure: Secondary | ICD-10-CM | POA: Diagnosis not present

## 2023-10-07 DIAGNOSIS — A419 Sepsis, unspecified organism: Secondary | ICD-10-CM | POA: Diagnosis not present

## 2023-10-07 LAB — COMPREHENSIVE METABOLIC PANEL WITH GFR
ALT: 17 U/L (ref 0–44)
AST: 21 U/L (ref 15–41)
Albumin: 2.1 g/dL — ABNORMAL LOW (ref 3.5–5.0)
Alkaline Phosphatase: 60 U/L (ref 38–126)
Anion gap: 12 (ref 5–15)
BUN: 20 mg/dL (ref 8–23)
CO2: 27 mmol/L (ref 22–32)
Calcium: 8.9 mg/dL (ref 8.9–10.3)
Chloride: 102 mmol/L (ref 98–111)
Creatinine, Ser: 1.3 mg/dL — ABNORMAL HIGH (ref 0.61–1.24)
GFR, Estimated: 57 mL/min — ABNORMAL LOW (ref >60)
Glucose, Bld: 119 mg/dL — ABNORMAL HIGH (ref 70–99)
Potassium: 2.9 mmol/L — ABNORMAL LOW (ref 3.5–5.1)
Sodium: 141 mmol/L (ref 135–145)
Total Bilirubin: 0.7 mg/dL (ref 0.0–1.2)
Total Protein: 5.6 g/dL — ABNORMAL LOW (ref 6.5–8.1)

## 2023-10-07 LAB — CBC
HCT: 30 % — ABNORMAL LOW (ref 39.0–52.0)
Hemoglobin: 9.9 g/dL — ABNORMAL LOW (ref 13.0–17.0)
MCH: 30.7 pg (ref 26.0–34.0)
MCHC: 33 g/dL (ref 30.0–36.0)
MCV: 92.9 fL (ref 80.0–100.0)
Platelets: 227 10*3/uL (ref 150–400)
RBC: 3.23 MIL/uL — ABNORMAL LOW (ref 4.22–5.81)
RDW: 13.9 % (ref 11.5–15.5)
WBC: 30.5 10*3/uL — ABNORMAL HIGH (ref 4.0–10.5)
nRBC: 0 % (ref 0.0–0.2)

## 2023-10-07 LAB — PROCALCITONIN: Procalcitonin: 2.52 ng/mL

## 2023-10-07 MED ORDER — POTASSIUM CHLORIDE 10 MEQ/100ML IV SOLN
10.0000 meq | INTRAVENOUS | Status: AC
  Administered 2023-10-07 (×3): 10 meq via INTRAVENOUS
  Filled 2023-10-07 (×3): qty 100

## 2023-10-07 MED ORDER — FUROSEMIDE 10 MG/ML IJ SOLN
40.0000 mg | Freq: Two times a day (BID) | INTRAMUSCULAR | Status: DC
  Administered 2023-10-07 – 2023-10-08 (×4): 40 mg via INTRAVENOUS
  Filled 2023-10-07 (×4): qty 4

## 2023-10-07 MED ORDER — POTASSIUM CHLORIDE CRYS ER 20 MEQ PO TBCR: 40.0000 meq | EXTENDED_RELEASE_TABLET | Freq: Two times a day (BID) | ORAL | Status: DC

## 2023-10-07 MED ORDER — APIXABAN 5 MG PO TABS
5.0000 mg | ORAL_TABLET | Freq: Two times a day (BID) | ORAL | Status: DC
  Administered 2023-10-07: 5 mg via ORAL
  Filled 2023-10-07 (×3): qty 1

## 2023-10-07 MED ORDER — POTASSIUM CHLORIDE 10 MEQ/100ML IV SOLN
10.0000 meq | Freq: Once | INTRAVENOUS | Status: AC
  Administered 2023-10-07: 10 meq via INTRAVENOUS
  Filled 2023-10-07: qty 100

## 2023-10-07 MED ORDER — POTASSIUM CHLORIDE 20 MEQ PO PACK
40.0000 meq | PACK | Freq: Two times a day (BID) | ORAL | Status: AC
  Administered 2023-10-07: 40 meq via ORAL
  Filled 2023-10-07 (×2): qty 2

## 2023-10-07 MED ORDER — AYR SALINE NASAL NA GEL: 1.0000 | NASAL | Status: DC | PRN

## 2023-10-07 MED ORDER — FLUCONAZOLE 100 MG PO TABS
200.0000 mg | ORAL_TABLET | Freq: Every day | ORAL | Status: DC
  Administered 2023-10-07: 200 mg via ORAL
  Filled 2023-10-07 (×2): qty 2

## 2023-10-07 NOTE — Progress Notes (Signed)
 PT Cancellation Note  Patient Details Name: Brian Ruiz MRN: [id number] DOB: 09-12-1945   Cancelled Treatment:    Reason Eval/Treat Not Completed: Patient declined, no reason specified  Patient requests therapy team follow-up tomorrow. Declines formal evaluation at this time. Anxious and short of breath. Will plan to attempt again tomorrow, schedule permitting.  Jory Ng, PT, DPT [facility]  Rehabilitation Services Physical Therapist Office: [phone] Website: [email or web address]   Alinda Irani 10/07/2023, 11:35 AM

## 2023-10-07 NOTE — Progress Notes (Signed)
 OT Cancellation Note  Patient Details Name: Brian Ruiz MRN: [id number] DOB: Aug 21, 1945   Cancelled Treatment:    Reason Eval/Treat Not Completed: Fatigue/lethargy limiting ability to participate. Pt requesting to hold due to anxious with SOB. Will attempt tomorrow.  Erving Heather OTR/L  Acute Rehab Services  [phone] office number   Stevphen Elders 10/07/2023, 11:40 AM

## 2023-10-07 NOTE — Plan of Care (Signed)

## 2023-10-07 NOTE — Progress Notes (Signed)
 PROGRESS NOTE        PATIENT DETAILS Name: Brian Ruiz Age: 78 y.o. Sex: male Date of Birth: 04/21/1946 Admit Date: 10/05/2023 Admitting Physician Kenny Peals, MD [id number], [facility]  Brief Summary: Patient is a 78 y.o.  male with history of HFrEF, PAF, CKD stage IIIa, HTN, BPH, narcotic dependence-presented from SNF for fever/shortness of breath-found to have acute hypoxic respiratory failure secondary to PNA.  Started on BiPAP and admitted to the hospitalist service.  Significant events: 3/31-4/11>> hospitalization for hypoxia-secondary to HFrEF exacerbation with low output state/COVID-19 pneumonia. 4/30-5/6>> hospitalization for stercoral ulceration-bright red blood per rectum-possible aspiration pneumonia. 5/8>> admit to [facility] from SNF-fever/SOB-requiring BiPAP.  Empiric antibiotics-admit to PCU. 5/9>> acute urinary retention-purulent urine obtained when Foley catheter placed.  Significant studies: 4/3>> TTE: EF 35-40%. 4/4>> LHC: Normal coronary angiography. 5/3>> colonoscopy: Ulcerated mucosa proximal rectum/rectosigmoid-moderate diverticulosis.  Polyp in ascending colon/sigmoid colon. 5/5>> patchy opacities-left lung-chronic right hemidiaphragm.  Significant microbiology data: 5/8>> COVID/influenza/RSV PCR: Negative 5/8>> blood culture: No growth 5/8>> urine culture: Pending  Procedures: None  Consults: Pall care  Subjective: Required BiPAP yesterday afternoon-throughout the night-liberated off BiPAP this morning-back on heated high flow.  Understands his tenuous situation-and wants to take it 1 day at a time.  Objective: Vitals: Blood pressure 106/64, pulse 73, temperature (!) 97.1 F (36.2 C), temperature source Axillary, resp. rate (!) 22, height 5\' 11"  (1.803 m), weight 72.9 kg, SpO2 96%.   Exam: Gen Exam:Alert awake-not in any distress-incredibly frail/cachectic appearing HEENT:atraumatic, normocephalic Chest: Bibasilar  rales CVS:S1S2 regular Abdomen:soft non tender, non distended Extremities:no edema Neurology: Non focal-generalized weakness. Skin: no rash  Pertinent Labs/Radiology:    Latest Ref Rng & Units 10/07/2023    7:05 AM 10/06/2023   10:21 AM 10/05/2023   11:07 PM  CBC  WBC 4.0 - 10.5 K/uL 30.5  32.2  32.4   Hemoglobin 13.0 - 17.0 g/dL 9.9  84.1  32.4   Hematocrit 39.0 - 52.0 % 30.0  31.0  31.5   Platelets 150 - 400 K/uL 227  223  222     Lab Results  Component Value Date   NA 141 10/07/2023   K 2.9 (L) 10/07/2023   CL 102 10/07/2023   CO2 27 10/07/2023     Assessment/Plan: Acute hypoxic respiratory failure-likely secondary to PNA and possible HFrEF exacerbation Continues to have severe hypoxemia-requiring either combination of BiPAP or heated high flow.   Blood cultures negative so far/MRSA PCR negative Stop Zyvox Continue cefepime /Flagyl  Lasix  40 mg IV twice daily-keep negative balance-watch electrolytes closely Appreciate SLP eval-continue regular diet. Very poor overall prognosis-discussed with palliative care team-family not yet ready to transition to comfort measures. Incentive spirometry/flutter valve when able Out of bed to chair when able..  Severe sepsis-likely secondary to PNA+/-UTI (POA-severe leukocytosis-hypoxic/febrile) Continues to be tenuous but overall stable Continue Cefepime /Flagyl  Add Fluconazole-given gross pus in urine-as urine cultures are positive for yeast. BP soft but stable on midodrine.  Acute on chronic HFrEF Although no peripheral edema-suspect some of hypoxia is driven by pulm edema Starting scheduled IV Lasix -will try and keep him in negative balance Follow electrolytes/weights/intake/output closely.  Elevated troponins Secondary to  demand ischemia Recent LHC negative Given his overall clinical situation-do not think he is a candidate for aggressive care at this point.  PAF Continue amiodarone  Per discharge summary-Eliquis  held due to  recent  GI bleeding-no evidence of GI bleeding-will go ahead and resume Eliquis  today as outlined in most recent discharge summary.  Hypokalemia Continue to replete-recheck in AM.  CKD stage IIIa Creatinine at baseline Follow closely  Acute urinary retention on 5/9 BPH Continue Flomax  Foley catheter placed 5/9-voiding trial-in the next week or so.  Recent imaging consistent on prior admit-consistent with mild left-sided hydronephrosis This was seen incidentally on CT PE study. Given severity of illness-await CT renal stone study.  Elevated troponins Secondary to  demand ischemia Recent LHC negative Given his overall clinical situation-do not think he is a candidate for aggressive care at this point.  Dementia Supportive care Maintain delirium precautions  Chronic pain syndrome-narcotic dependence As needed narcotics for now.  Palliative care DNR in place Very poor overall prognosis-continues to be very tenuous-this is his third hospitalization in the past several weeks-palliative care has discussed with family-they are not yet ready to transition to comfort measures.  Per my discussion with spouse on 5/9-no role for any further escalation in care-she understands tenuous clinical situation.  If he deteriorates any further-he is very much appropriate to transition to full comfort measures.    The patient is critically ill with multiple organ system failure and requires high complexity decision making for assessment and support, frequent evaluation and titration of therapies, advanced monitoring, review of radiographic studies and interpretation of complex data.   Total Critical time spent equals 45 minutes   Code status:   Code Status: Limited: Do not attempt resuscitation (DNR) -DNR-LIMITED -Do Not Intubate/DNI    DVT Prophylaxis: SCDs Start: 10/05/23 2019   Family Communication: Spouse-[name]-[phone] -updated over the phone on 5/9-understands tenuous situation-  confirms DNR.  Understands that if patient were to deteriorate-apart from initiating hospice care-no further role in escalation of care.  She is aware of pending palliative care evaluation.   Disposition Plan: Status is: Inpatient Remains inpatient appropriate because: Severity of illness   Planned Discharge Destination:Skilled nursing facility   Diet: Diet Order             Diet regular Room service appropriate? Yes with Assist; Fluid consistency: Thin  Diet effective now                     Antimicrobial agents: Anti-infectives (From admission, onward)    Start     Dose/Rate Route Frequency Ordered Stop   10/06/23 2200  linezolid (ZYVOX) IVPB 600 mg        600 mg 300 mL/hr over 60 Minutes Intravenous Every 12 hours 10/06/23 0812     10/06/23 2045  vancomycin  (VANCOCIN ) IVPB 1000 mg/200 mL premix  Status:  Discontinued        1,000 mg 200 mL/hr over 60 Minutes Intravenous Every 24 hours 10/05/23 2033 10/06/23 0812   10/06/23 0800  ceFEPIme  (MAXIPIME ) 2 g in sodium chloride  0.9 % 100 mL IVPB        2 g 200 mL/hr over 30 Minutes Intravenous Every 12 hours 10/05/23 2033     10/05/23 2030  metroNIDAZOLE  (FLAGYL ) IVPB 500 mg        500 mg 100 mL/hr over 60 Minutes Intravenous Every 12 hours 10/05/23 2022 10/12/23 2029   10/05/23 1845  vancomycin  (VANCOCIN ) IVPB 1000 mg/200 mL premix  Status:  Discontinued        1,000 mg 200 mL/hr over 60 Minutes Intravenous  Once 10/05/23 1831 10/05/23 1837   10/05/23 1845  ceFEPIme  (MAXIPIME ) 2 g in sodium chloride  0.9 %  100 mL IVPB        2 g 200 mL/hr over 30 Minutes Intravenous  Once 10/05/23 1831 10/05/23 1955   10/05/23 1845  vancomycin  (VANCOREADY) IVPB 1500 mg/300 mL        1,500 mg 150 mL/hr over 120 Minutes Intravenous  Once 10/05/23 1837 10/05/23 2210        MEDICATIONS: Scheduled Meds:  amiodarone   200 mg Oral Daily   furosemide   40 mg Intravenous Q12H   guaiFENesin   600 mg Oral BID    HYDROmorphone  (DILAUDID )  injection  1 mg Intravenous Q8H   HYDROmorphone   4 mg Oral Q8H   midodrine  5 mg Oral TID WC   polyethylene glycol  17 g Oral Daily   sertraline   25 mg Oral TID   sodium chloride  flush  3 mL Intravenous Q12H   tamsulosin   0.4 mg Oral QPC breakfast   Continuous Infusions:  ceFEPime  (MAXIPIME ) IV 2 g (10/07/23 0940)   linezolid (ZYVOX) IV 600 mg (10/07/23 1024)   metronidazole  500 mg (10/07/23 0944)   potassium chloride      PRN Meds:.acetaminophen  **OR** acetaminophen , bisacodyl , levalbuterol , ondansetron  **OR** ondansetron  (ZOFRAN ) IV, senna-docusate   I have personally reviewed following labs and imaging studies  LABORATORY DATA: CBC: Recent Labs  Lab 10/03/23 1007 10/05/23 1651 10/05/23 2307 10/06/23 1021 10/07/23 0705  WBC 18.3* 25.9* 32.4* 32.2* 30.5*  NEUTROABS  --  23.2*  --   --   --   HGB 9.9* 11.1* 10.4* 10.3* 9.9*  HCT 30.0* 34.9* 31.5* 31.0* 30.0*  MCV 92.0 94.1 93.2 92.0 92.9  PLT 250 251 222 223 227    Basic Metabolic Panel: Recent Labs  Lab 10/02/23 0601 10/03/23 1007 10/05/23 1651 10/05/23 2307 10/06/23 1021 10/07/23 0705  NA 135 133* 133* 136 139 141  K 3.3* 3.5 3.3* 3.0* 2.9* 2.9*  CL 101 97* 95* 99 102 102  CO2 25 26 23 24 24 27   GLUCOSE 93 99 114* 115* 112* 119*  BUN 13 17 21 23 20 20   CREATININE 1.16 1.35* 1.52* 1.58* 1.45* 1.30*  CALCIUM 8.7* 8.4* 8.9 8.6* 8.6* 8.9  MG 2.0  --   --   --  1.8  --     GFR: Estimated Creatinine Clearance: 49.1 mL/min (A) (by C-G formula based on SCr of 1.3 mg/dL (H)).  Liver Function Tests: Recent Labs  Lab 10/05/23 1651 10/05/23 2307 10/06/23 1021 10/07/23 0705  AST 28 23 21 21   ALT 20 19 17 17   ALKPHOS 62 52 58 60  BILITOT 0.8 1.0 0.9 0.7  PROT 6.2* 5.7* 5.7* 5.6*  ALBUMIN  2.6* 2.4* 2.3* 2.1*   No results for input(s): "LIPASE", "AMYLASE" in the last 168 hours. No results for input(s): "AMMONIA" in the last 168 hours.  Coagulation Profile: No results for input(s): "INR", "PROTIME" in the  last 168 hours.  Cardiac Enzymes: No results for input(s): "CKTOTAL", "CKMB", "CKMBINDEX", "TROPONINI" in the last 168 hours.  BNP (last 3 results) No results for input(s): "PROBNP" in the last 8760 hours.  Lipid Profile: No results for input(s): "CHOL", "HDL", "LDLCALC", "TRIG", "CHOLHDL", "LDLDIRECT" in the last 72 hours.  Thyroid Function Tests: No results for input(s): "TSH", "T4TOTAL", "FREET4", "T3FREE", "THYROIDAB" in the last 72 hours.  Anemia Panel: No results for input(s): "VITAMINB12", "FOLATE", "FERRITIN", "TIBC", "IRON", "RETICCTPCT" in the last 72 hours.  Urine analysis:    Component Value Date/Time   COLORURINE AMBER (A) 10/05/2023 2112   APPEARANCEUR TURBID (A) 10/05/2023  2112   LABSPEC 1.011 10/05/2023 2112   PHURINE 5.0 10/05/2023 2112   GLUCOSEU NEGATIVE 10/05/2023 2112   HGBUR MODERATE (A) 10/05/2023 2112   BILIRUBINUR NEGATIVE 10/05/2023 2112   KETONESUR NEGATIVE 10/05/2023 2112   PROTEINUR 30 (A) 10/05/2023 2112   UROBILINOGEN 0.2 05/15/2011 1151   NITRITE NEGATIVE 10/05/2023 2112   LEUKOCYTESUR LARGE (A) 10/05/2023 2112    Sepsis Labs: Lactic Acid, Venous    Component Value Date/Time   LATICACIDVEN 1.7 10/05/2023 1900    MICROBIOLOGY: Recent Results (from the past 240 hours)  Culture, blood (routine x 2)     Status: None (Preliminary result)   Collection Time: 10/05/23  4:50 PM   Specimen: BLOOD  Result Value Ref Range Status   Specimen Description BLOOD RIGHT ANTECUBITAL  Final   Special Requests   Final    BOTTLES DRAWN AEROBIC AND ANAEROBIC Blood Culture adequate volume   Culture   Final    NO GROWTH 2 DAYS Performed at [facility], [address] [place], [place] [address]    Report Status PENDING  Incomplete  Resp panel by RT-PCR (RSV, Flu A&B, Covid) Anterior Nasal Swab     Status: None   Collection Time: 10/05/23  8:17 PM   Specimen: Anterior Nasal Swab  Result Value Ref Range Status   SARS Coronavirus 2 by RT PCR  NEGATIVE NEGATIVE Final   Influenza A by PCR NEGATIVE NEGATIVE Final   Influenza B by PCR NEGATIVE NEGATIVE Final    Comment: (NOTE) The Xpert Xpress SARS-CoV-2/FLU/RSV plus assay is intended as an aid in the diagnosis of influenza from Nasopharyngeal swab specimens and should not be used as a sole basis for treatment. Nasal washings and aspirates are unacceptable for Xpert Xpress SARS-CoV-2/FLU/RSV testing.  Fact Sheet for Patients: BloggerCourse.com  Fact Sheet for Healthcare Providers: SeriousBroker.it  This test is not yet approved or cleared by the [place]  FDA and has been authorized for detection and/or diagnosis of SARS-CoV-2 by FDA under an Emergency Use Authorization (EUA). This EUA will remain in effect (meaning this test can be used) for the duration of the COVID-19 declaration under Section 564(b)(1) of the Act, 21 U.S.C. section 360bbb-3(b)(1), unless the authorization is terminated or revoked.     Resp Syncytial Virus by PCR NEGATIVE NEGATIVE Final    Comment: (NOTE) Fact Sheet for Patients: BloggerCourse.com  Fact Sheet for Healthcare Providers: SeriousBroker.it  This test is not yet approved or cleared by the [place]  FDA and has been authorized for detection and/or diagnosis of SARS-CoV-2 by FDA under an Emergency Use Authorization (EUA). This EUA will remain in effect (meaning this test can be used) for the duration of the COVID-19 declaration under Section 564(b)(1) of the Act, 21 U.S.C. section 360bbb-3(b)(1), unless the authorization is terminated or revoked.  Performed at [facility], [address] [place], [place] [address]   Urine Culture     Status: Abnormal   Collection Time: 10/05/23  9:12 PM   Specimen: Urine, Random  Result Value Ref Range Status   Specimen Description URINE, RANDOM  Final   Special Requests   Final     NONE Reflexed from [id number] Performed at [facility], [address] [place], [place] [address]    Culture 70,000 COLONIES/mL YEAST (A)  Final   Report Status 10/06/2023 FINAL  Final  Culture, blood (routine x 2)     Status: None (Preliminary result)   Collection Time: 10/05/23 11:07 PM  Specimen: BLOOD LEFT HAND  Result Value Ref Range Status   Specimen Description BLOOD LEFT HAND  Final   Special Requests   Final    BOTTLES DRAWN AEROBIC AND ANAEROBIC Blood Culture adequate volume   Culture   Final    NO GROWTH 2 DAYS Performed at [facility], [address] [place], [place] [address]    Report Status PENDING  Incomplete  MRSA Next Gen by PCR, Nasal     Status: None   Collection Time: 10/06/23 12:26 PM   Specimen: Nasal Mucosa; Nasal Swab  Result Value Ref Range Status   MRSA by PCR Next Gen NOT DETECTED NOT DETECTED Final    Comment: (NOTE) The GeneXpert MRSA Assay (FDA approved for NASAL specimens only), is one component of a comprehensive MRSA colonization surveillance program. It is not intended to diagnose MRSA infection nor to guide or monitor treatment for MRSA infections. Test performance is not FDA approved in patients less than 6 years old. Performed at [facility], [address], [place], [place] [address]     RADIOLOGY STUDIES/RESULTS: DG Chest Port 1V same Day Result Date: 10/07/2023 CLINICAL DATA:  Respiratory distress and shortness of breath. EXAM: PORTABLE CHEST 1 VIEW COMPARISON:  10/05/2023 FINDINGS: Stable cardiomediastinal contours. Aortic atherosclerotic calcifications. Unchanged asymmetric elevation of the right hemidiaphragm. There has been interval worsening aeration to both lungs with progressive diffuse increase interstitial markings and ground-glass opacification throughout both lungs. Atelectasis along the elevated right hemidiaphragm appears increased from prior study. Progressive consolidative type change noted in the left  base. Visualized osseous structures are unremarkable. IMPRESSION: 1. Interval worsening aeration to both lungs with progressive diffuse increase interstitial markings and ground-glass opacification throughout both lungs. Findings are compatible with worsening pulmonary edema versus atypical infection. 2. Progressive consolidative type change in the left base. 3. Unchanged asymmetric elevation of the right hemidiaphragm. Electronically Signed   By: Kimberley Penman M.D.   On: 10/07/2023 09:01   DG Chest Port 1 View Result Date: 10/05/2023 CLINICAL DATA:  Shortness of breath. EXAM: PORTABLE CHEST 1 VIEW COMPARISON:  CTA chest dated 10/02/2023. Chest radiograph dated 10/01/2023. FINDINGS: Chronic elevation of the right hemidiaphragm. Stable cardiomediastinal silhouette. Patchy opacities again noted in the left lung. Bibasilar atelectasis. No pneumothorax. No acute osseous abnormality. Gas distended loops of bowel in the right upper quadrant. IMPRESSION: 1. Similar patchy opacities in the left lung. 2. Bibasilar atelectasis. 3. Gas distended loops of bowel in the right upper quadrant. Electronically Signed   By: Mannie Seek M.D.   On: 10/05/2023 17:36     LOS: 2 days   Kimberly Penna, MD  [facility]    To contact the attending provider between 7A-7P or the covering provider during after hours 7P-7A, please log into the web site [email or web address] and access using universal  password for that web site. If you do not have the password, please call the hospital operator.  10/07/2023, 10:27 AM

## 2023-10-07 NOTE — Progress Notes (Signed)
 Daily Progress Note   Patient Name: Brian Ruiz       Date: 10/07/2023 DOB: March 08, 1946  Age: 78 y.o. MRN#: [id number] Attending Physician: Burton Casey, MD Primary Care Physician: [facility] Admit Date: 10/05/2023  Reason for Consultation/Follow-up: Establishing goals of care  Subjective: Medical records reviewed including progress notes, labs and imaging. Patient assessed at the bedside. Discussed with RN. His wife arrived for visit and we discussed GOC, patient's quality of life, disease trajectory and expectations at EOL.  Created space and opportunity for patient and family's thoughts and feelings on his current illness.  Patient declined to participate in the conversation today.    His wife Willetta Harpin shares that he has previously had outpatient palliative care from [facility] and they were looking into [facility] when that palliative program was paused.  His quality of life is described as good considering the circumstances, though worsening in the past 6 weeks with repeat hospitalizations.  He was enjoying time at home with his wife, watching movies, and with adequate pain management despite breakthrough pain instances.  He has previously tried gabapentin  and fentanyl  patch without relief.  Patient was independent with ADLs and ambulated with a walker, now with progressive leg weakness.    Emotional support and therapeutic listening was provided as Willetta Harpin shared the poor bedside manner with which hospitalization was presented to patient at [facility].  He stated he did not want to come to the hospital, but then clarified he knew he needed to in order to receive appropriate interventions for his worsening respiratory status.  She has a realistic understanding of the patient's overall poor prognosis.  Her son is having a difficult time, as  he is currently in [place]  and having double knee surgery and inability to be with patient.  Her daughter is visiting from [employer].  Hospice philosophy was discussed in detail as well as option consider titration of patient's Dilaudid  for respiratory relief and potentially weaning oxygen enough to die at home peacefully. Willetta Harpin has previous experience with her mother on hospice and she understands.  For now, plan is to continue allowing more time for outcomes in taking any day by day based on patient's suffering and quality of life.  Questions and concerns addressed. PMT will continue to support holistically.   Length of Stay: 2   Physical Exam Vitals and nursing note reviewed.  Constitutional:      General: He is not in acute distress.    Appearance: He is ill-appearing.  HENT:     Head: Normocephalic and atraumatic.  Cardiovascular:     Rate and Rhythm: Normal rate.  Pulmonary:     Effort: Tachypnea present.     Comments: HHFNC Neurological:     Mental Status: He is alert.             Vital Signs: BP 97/60   Pulse 73   Temp (!) 97.1 F (36.2 C) (Axillary)   Resp 18   Ht 5\' 11"  (1.803 m)   Wt 72.9 kg   SpO2 96%   BMI 22.42 kg/m  SpO2: SpO2: 96 % O2 Device: O2 Device: Bi-PAP O2 Flow Rate: O2 Flow Rate (L/min): 40 L/min      Palliative Assessment/Data:   Palliative Care Assessment & Plan   Patient Profile: 78 y.o. male  with past medical history of HFrEF (EF 30%), PAF on Eliquis  (recently on hold for rectal bleeding), CKD stage IIIa with left hydronephrosis, HTN, BPH, depression/anxiety, chronic pain with narcotic dependence admitted on 10/05/2023 with dyspnea.    Patient was recently hospitalized twice: 3/31 >> 4/11: acute hypoxic respiratory failure due to COVID-19 pneumonia.  Subsequently developed A-fib with RVR then low output cardiogenic shock requiring milrinone .  Found to have EF 20-25%.   4/30 >> 5/6: BRBPR and hematochezia from rectal ulceration seen on  colonoscopy.  Felt secondary to stercoral ulceration.  Biopsy negative for significant pathology.  During admission there was also concern for aspiration pneumonia versus pneumonitis. He was discharged to [facility] on 1 week Augmentin .   He is now admitted for acute hypoxic respiratory failure secondary to PNA. Started on BiPAP and having difficulty coming off.    Assessment: Goals of care conversation HFrEF Acute hypoxic respiratory failure Severe sepsis UTI with yeast  Recommendations/Plan: Continue DNR/DNI Continue current care plan and allow more time for outcomes Patient would benefit from hospice if aligned with goals of care Ongoing goals of care discussions pending clinical course Psychosocial and emotional support provided PMT will continue to follow and support   Prognosis: Tenuous with high risk for further decline and decompensation    Discharge Planning: To Be Determined  Care plan was discussed with patient, patient's wife, RN, primary attending   MDM high  Visit consisted of counseling and education dealing with the complex and emotionally intense issues of symptom management and palliative care in the setting of serious and potentially life-threatening illness. Greater than 50% of this time was spent counseling and coordinating care related to the above assessment and plan.  Personally spent 50 minutes in patient care including extensive chart review (labs, imaging, progress/consult notes, vital signs), medically appropriate exam, discussed with treatment team, education to patient, family, and staff, documenting clinical information, medication review and management, coordination of care, and available advanced directive documents.           Kayan Blissett P Aleta Manternach, PA-C  Palliative Medicine Team Team phone # [phone]  Thank you for allowing the Palliative Medicine Team to assist in the care of this patient. Please utilize secure chat with additional  questions, if there is no response within 30 minutes please call the above phone number.  Palliative Medicine Team providers are available by phone from 7am to 7pm daily and can be reached through the team cell phone.  Should this patient require assistance outside of these hours, please call the patient's attending physician.

## 2023-10-07 NOTE — Progress Notes (Signed)
 RT accompanied patient on BiPAP and RN from [phone] to CT and the return trip. Tolerated well, placed on HHFNC upon return to [id number].

## 2023-10-07 NOTE — Progress Notes (Signed)
 PHARMACY - ANTICOAGULATION CONSULT NOTE  Pharmacy Consult for Eliquis  Indication: atrial fibrillation  Allergies  Allergen Reactions   Nsaids Shortness Of Breath and Other (See Comments)    Bronchospasm    Roxicodone  [Oxycodone ] Other (See Comments)    Psychomotor agitation   Tolectin [Tolmetin] Shortness Of Breath   Ultram [Tramadol] Anaphylaxis   Actiq  [Fentanyl ] Other (See Comments)    Unknown reaction   Codeine Anxiety and Other (See Comments)    Bronchospasm Irritability     Patient Measurements: Height: 5\' 11"  (180.3 cm) Weight: 72.9 kg (160 lb 11.5 oz) IBW/kg (Calculated) : 75.3 HEPARIN  DW (KG): 72.9  Vital Signs: Temp: 97.1 F (36.2 C) (05/10 0735) Temp Source: Axillary (05/10 0735) BP: 106/64 (05/10 0800) Pulse Rate: 73 (05/10 0800)  Labs: Recent Labs    10/05/23 1651 10/05/23 1900 10/05/23 2307 10/06/23 1021 10/07/23 0705  HGB 11.1*  --  10.4* 10.3* 9.9*  HCT 34.9*  --  31.5* 31.0* 30.0*  PLT 251  --  222 223 227  CREATININE 1.52*  --  1.58* 1.45* 1.30*  TROPONINIHS 737* 850* 713*  --   --     Estimated Creatinine Clearance: 49.1 mL/min (A) (by C-G formula based on SCr of 1.3 mg/dL (H)).   Medical History: Past Medical History:  Diagnosis Date   Arthritis    Back pain, chronic    intolerant to narcotics, avoids NSAIDs due to vioxx related bleed,  sees pain clinic as [place], prior eval by [facility] neuro in [place]   Cholecystitis    Choledocholithiasis    Chronic diastolic CHF (congestive heart failure) (HCC) 05/16/2018   Per Dr Letta Raw note in 2019, pt did not have heart failure but hypertensive heart disease.  Azzie Bollman FNP-C   Encephalopathy acute 05/15/2011   Heart murmur    Hypertension    Inguinal hernia    Polyneuropathy    S/P thyroidectomy 1967   Scoliosis    Urinary retention     Medications:  Scheduled:   amiodarone   200 mg Oral Daily   apixaban   5 mg Oral BID   fluconazole  200 mg Oral Daily   furosemide   40 mg  Intravenous Q12H   guaiFENesin   600 mg Oral BID    HYDROmorphone  (DILAUDID ) injection  1 mg Intravenous Q8H   HYDROmorphone   4 mg Oral Q8H   midodrine  5 mg Oral TID WC   polyethylene glycol  17 g Oral Daily   potassium chloride   40 mEq Oral BID   sertraline   25 mg Oral TID   sodium chloride  flush  3 mL Intravenous Q12H   tamsulosin   0.4 mg Oral QPC breakfast   Infusions:   ceFEPime  (MAXIPIME ) IV 2 g (10/07/23 0940)   metronidazole  500 mg (10/07/23 0944)   potassium chloride      PRN: acetaminophen  **OR** acetaminophen , bisacodyl , levalbuterol , ondansetron  **OR** ondansetron  (ZOFRAN ) IV, senna-docusate  Assessment: 78 yo male w/ PMH of PAF. Eliquis  held due to recent GI bleeding. No evidence of GI bleeding- pharmacy consulted to resume Eliquis  today as outlined in most recent discharge summary.CBC WNL.   Plan:  Resume Eliquis  5 mg BID  Monitor for S/sx of bleeding and CBC   Adaline Ada, PharmD PGY1 Pharmacy Resident 10/07/2023 10:57 AM

## 2023-10-08 ENCOUNTER — Inpatient Hospital Stay (HOSPITAL_COMMUNITY)

## 2023-10-08 DIAGNOSIS — A419 Sepsis, unspecified organism: Secondary | ICD-10-CM | POA: Diagnosis not present

## 2023-10-08 DIAGNOSIS — I5022 Chronic systolic (congestive) heart failure: Secondary | ICD-10-CM | POA: Diagnosis not present

## 2023-10-08 DIAGNOSIS — J9601 Acute respiratory failure with hypoxia: Secondary | ICD-10-CM | POA: Diagnosis not present

## 2023-10-08 DIAGNOSIS — J189 Pneumonia, unspecified organism: Secondary | ICD-10-CM | POA: Diagnosis not present

## 2023-10-08 DIAGNOSIS — R69 Illness, unspecified: Secondary | ICD-10-CM | POA: Diagnosis not present

## 2023-10-08 DIAGNOSIS — N1831 Chronic kidney disease, stage 3a: Secondary | ICD-10-CM | POA: Diagnosis not present

## 2023-10-08 LAB — COMPREHENSIVE METABOLIC PANEL WITH GFR
ALT: 14 U/L (ref 0–44)
AST: 16 U/L (ref 15–41)
Albumin: 2 g/dL — ABNORMAL LOW (ref 3.5–5.0)
Alkaline Phosphatase: 58 U/L (ref 38–126)
Anion gap: 13 (ref 5–15)
BUN: 25 mg/dL — ABNORMAL HIGH (ref 8–23)
CO2: 26 mmol/L (ref 22–32)
Calcium: 8.8 mg/dL — ABNORMAL LOW (ref 8.9–10.3)
Chloride: 104 mmol/L (ref 98–111)
Creatinine, Ser: 1.49 mg/dL — ABNORMAL HIGH (ref 0.61–1.24)
GFR, Estimated: 48 mL/min — ABNORMAL LOW (ref >60)
Glucose, Bld: 101 mg/dL — ABNORMAL HIGH (ref 70–99)
Potassium: 3.1 mmol/L — ABNORMAL LOW (ref 3.5–5.1)
Sodium: 143 mmol/L (ref 135–145)
Total Bilirubin: 0.8 mg/dL (ref 0.0–1.2)
Total Protein: 5.4 g/dL — ABNORMAL LOW (ref 6.5–8.1)

## 2023-10-08 LAB — CBC
HCT: 29.2 % — ABNORMAL LOW (ref 39.0–52.0)
Hemoglobin: 9.4 g/dL — ABNORMAL LOW (ref 13.0–17.0)
MCH: 30.8 pg (ref 26.0–34.0)
MCHC: 32.2 g/dL (ref 30.0–36.0)
MCV: 95.7 fL (ref 80.0–100.0)
Platelets: 246 10*3/uL (ref 150–400)
RBC: 3.05 MIL/uL — ABNORMAL LOW (ref 4.22–5.81)
RDW: 14 % (ref 11.5–15.5)
WBC: 28.4 10*3/uL — ABNORMAL HIGH (ref 4.0–10.5)
nRBC: 0 % (ref 0.0–0.2)

## 2023-10-08 LAB — LEGIONELLA PNEUMOPHILA SEROGP 1 UR AG: L. pneumophila Serogp 1 Ur Ag: NEGATIVE

## 2023-10-08 LAB — MAGNESIUM: Magnesium: 1.9 mg/dL (ref 1.7–2.4)

## 2023-10-08 MED ORDER — POTASSIUM CHLORIDE 20 MEQ PO PACK: 40.0000 meq | PACK | Freq: Four times a day (QID) | ORAL | Status: AC

## 2023-10-08 NOTE — Plan of Care (Signed)

## 2023-10-08 NOTE — Progress Notes (Signed)
 PT Cancellation Note  Patient Details Name: Brian Ruiz MRN: [id number] DOB: Feb 10, 1946   Cancelled Treatment:    Reason Eval/Treat Not Completed: Patient declines acute PT services, requests PT/OT sign off; wife in agreement. Wife reports initial plan was for him to be able to continue SNF rehab or return home, but now sounds like they are more interested in comfort care route. Acute PT will sign off; please reconsult if new needs arise.  Maribel Luis, PT, DPT Acute Rehabilitation Services  Personal: Secure Chat Rehab Office: [phone]  [name] 10/08/2023, 8:32 AM

## 2023-10-08 NOTE — Progress Notes (Signed)
 OT Cancellation Note  Patient Details Name: ROCIO ROME MRN: [id number] DOB: 07/05/1945   Cancelled Treatment:    Reason Eval/Treat Not Completed: Patient declined, no reason specified.  Patient declines acute therapy services, requests PT/OT sign off; wife in agreement. Wife reports initial plan was for him to be able to continue SNF rehab or return home, but now sounds like they are more interested in comfort care route. Acute OT will sign off; please reconsult if new needs arise.   Karilyn Ouch, OTR/L [facility] Acute Rehabilitation Office: [phone]   Emery Hans 10/08/2023, 9:13 AM

## 2023-10-08 NOTE — Plan of Care (Signed)
 progressing

## 2023-10-08 NOTE — Progress Notes (Signed)
 Pt taken off bipap at this time per pt and family request. Pt placed on heated high flow nasal cannula and titrated per pt comfort/work of breathing.  Pt placed on 55L 100%. RT will continue to monitor and be available as needed.

## 2023-10-08 NOTE — Progress Notes (Signed)
 Daily Progress Note   Patient Name: Brian Ruiz       Date: 10/08/2023 DOB: [birth year]  Age: 78 y.o. MRN#: [id number] Attending Physician: Burton Casey, MD Primary Care Physician: [facility] Admit Date: 10/05/2023  Reason for Consultation/Follow-up: Establishing goals of care  Subjective: Medical records reviewed including progress notes, labs and imaging. Patient assessed at the bedside.  He reports consistent chronic pain without change from baseline, no other concerns or distress.  Per RN, I just missed several visitors including patient's wife.  Created space and opportunity for patient's thoughts and feelings on his current illness.  He shares his appreciation for my conversation with wife yesterday, adding that he is not avoiding conversations with me but is having a very hard time speaking due to his dyspnea.  Reassurance was provided.  He confirms that his daughter will be visiting later today.  I offered to call his wife and coordinate a time for my return to discuss next steps with him and his family.  He is agreeable coming adding that it would likely be best to have this discussion tomorrow.  Called patient's wife but was unable to reach.  Left voicemail with PMT contact information.  She did call back and leave a voicemail on PMT phone line, stating that she would likely need support with decision making in the near future after additional time to process.  I called her again and left another voicemail confirming I received her message and clarifying that I did mistakenly call her son today.  Questions and concerns addressed. PMT will continue to support holistically.   Length of Stay: 3   Physical Exam Vitals and nursing note reviewed.  Constitutional:      General: He is not in acute distress.     Appearance: He is ill-appearing.  HENT:     Head: Normocephalic and atraumatic.  Cardiovascular:     Rate and Rhythm: Normal rate.  Pulmonary:     Effort: Tachypnea present.     Comments: [facility] Neurological:     Mental Status: He is alert.          Vital Signs: BP 116/70   Pulse 83   Temp 98 F (36.7 C) (Axillary)   Resp (!) 24   Ht 5\' 11"  (1.803 m)   Wt 72.9 kg   SpO2 94%   BMI 22.42 kg/m  SpO2: SpO2: 94 % O2 Device: O2 Device: (S) Heated High Flow Nasal Cannula O2 Flow Rate: O2 Flow Rate (L/min): (S) 55 L/min      Palliative Assessment/Data:   Palliative Care Assessment & Plan  Patient Profile: 78 y.o. male  with past medical history of HFrEF (EF 30%), PAF on Eliquis  (recently on hold for rectal bleeding), CKD stage IIIa with left hydronephrosis, HTN, BPH, depression/anxiety, chronic pain with narcotic dependence admitted on 10/05/2023 with dyspnea.    Patient was recently hospitalized twice: 3/31 >> 4/11: acute hypoxic respiratory failure due to COVID-19 pneumonia.  Subsequently developed A-fib with RVR then low output cardiogenic shock requiring milrinone .  Found to have EF 20-25%.   4/30 >> 5/6: BRBPR and hematochezia from rectal ulceration seen on colonoscopy.  Felt secondary to stercoral ulceration.  Biopsy negative for significant pathology.  During admission there was also concern for aspiration pneumonia versus pneumonitis. He was discharged to [facility] on 1 week Augmentin .   He is now admitted for acute hypoxic respiratory failure secondary to PNA. Started on BiPAP and having difficulty coming off.    Assessment: Goals of care conversation HFrEF Acute hypoxic respiratory failure Severe sepsis UTI with yeast  Recommendations/Plan: Continue DNR/DNI Continue current care plan and allow more time for outcomes Patient would benefit from hospice if aligned with goals of care Ongoing goals of care discussions pending clinical course Psychosocial and  emotional support provided PMT will continue to follow and support   Prognosis: Tenuous with high risk for further decline and decompensation    Discharge Planning: To Be Determined  Care plan was discussed with patient, patient's wife, RN, primary attending    Visit consisted of counseling and education dealing with the complex and emotionally intense issues of symptom management and palliative care in the setting of serious and potentially life-threatening illness. Greater than 50% of this time was spent counseling and coordinating care related to the above assessment and plan.  Personally spent 35 minutes in patient care including extensive chart review (labs, imaging, progress/consult notes, vital signs), medically appropriate exam, discussed with treatment team, education to patient, family, and staff, documenting clinical information, medication review and management, coordination of care, and available advanced directive documents.           Torryn Hudspeth P Ein Rijo, PA-C  Palliative Medicine Team Team phone # [phone]  Thank you for allowing the Palliative Medicine Team to assist in the care of this patient. Please utilize secure chat with additional questions, if there is no response within 30 minutes please call the above phone number.  Palliative Medicine Team providers are available by phone from 7am to 7pm daily and can be reached through the team cell phone.  Should this patient require assistance outside of these hours, please call the patient's attending physician.

## 2023-10-08 NOTE — Progress Notes (Signed)
 PROGRESS NOTE        PATIENT DETAILS Name: Brian Ruiz Age: 78 y.o. Sex: male Date of Birth: July 10, 1945 Admit Date: 10/05/2023 Admitting Physician Kenny Peals, MD ZOX:[id number], [facility]  Brief Summary: Patient is a 78 y.o.  male with history of HFrEF, PAF, CKD stage IIIa, HTN, BPH, narcotic dependence-presented from SNF for fever/shortness of breath-found to have acute hypoxic respiratory failure secondary to PNA.  Started on BiPAP and admitted to the hospitalist service.  Significant events: 3/31-4/11>> hospitalization for hypoxia-secondary to HFrEF exacerbation with low output state/COVID-19 pneumonia. 4/30-5/6>> hospitalization for stercoral ulceration-bright red blood per rectum-possible aspiration pneumonia. 5/8>> admit to [facility] from SNF-fever/SOB-requiring BiPAP.  Empiric antibiotics-admit to PCU. 5/9>> acute urinary retention-purulent urine obtained when Foley catheter placed.  Significant studies: 4/3>> TTE: EF 35-40%. 4/4>> LHC: Normal coronary angiography. 5/3>> colonoscopy: Ulcerated mucosa proximal rectum/rectosigmoid-moderate diverticulosis.  Polyp in ascending colon/sigmoid colon. 5/5>> patchy opacities-left lung-chronic right hemidiaphragm. 5/10>> CT renal stone study: No hydronephrosis/stones.  Interval development of groundglass opacities/bronchiectasis throughout visualized bilateral lung.  Significant microbiology data: 5/8>> COVID/influenza/RSV PCR: Negative 5/8>> blood culture: No growth 5/8>> urine culture: Pending  Procedures: None  Consults: Pall care  Subjective: Required BiPAP again last night-appears comfortable-spouse at bedside.  Long discussion-she is awaiting for her daughter to arrive-and wants to take it 1 day at a time-if no improvement-she is open to hospice care.  Objective: Vitals: Blood pressure 116/70, pulse 83, temperature 98 F (36.7 C), temperature source Axillary, resp. rate (!) 24, height 5\' 11"   (1.803 m), weight 72.9 kg, SpO2 94%.   Exam: Gen Exam:Alert awake-not in any distress-incredibly frail/cachectic appearing. HEENT:atraumatic, normocephalic Chest: B/L clear to auscultation anteriorly CVS: Bibasilar rales Abdomen:soft non tender, non distended Extremities:no edema Neurology: Non focal Skin: no rash  Pertinent Labs/Radiology:    Latest Ref Rng & Units 10/08/2023    6:38 AM 10/07/2023    7:05 AM 10/06/2023   10:21 AM  CBC  WBC 4.0 - 10.5 K/uL 28.4  30.5  32.2   Hemoglobin 13.0 - 17.0 g/dL 9.4  9.9  11.9   Hematocrit 39.0 - 52.0 % 29.2  30.0  31.0   Platelets 150 - 400 K/uL 246  227  223     Lab Results  Component Value Date   NA 143 10/08/2023   K 3.1 (L) 10/08/2023   CL 104 10/08/2023   CO2 26 10/08/2023     Assessment/Plan: Acute hypoxic respiratory failure-likely secondary to PNA and possible HFrEF exacerbation Continues to have severe hypoxemia-requiring heated high flow during the daytime-BiPAP at evening/night Essentially unchanged for the past several days Remains on cefepime /Flagyl /IV Lasix  Keep negative balance Follow renal function closely Remains very tenuous-but somewhat clinically improved compared to how she first presented Overall prognosis remains very poor-spouse is contemplating transitioning to comfort measures-but is waiting for daughter to arrive from out of state. PT/OT/mobilization as able Incentive spirometry/flutter valve as able.  Severe sepsis-likely secondary to PNA+/-UTI (POA-severe leukocytosis-hypoxic/febrile) Some mild improvement-leukocytosis downtrending Continue cefepime /Flagyl  for PNA-fluconazole for yeast in urine (purulent urine on 5/9 Foley catheter placed) Continue supportive care.  Acute on chronic HFrEF Although no peripheral edema-suspicion for some amount of pulm edema contributing to hypoxia Continue IV Lasix -keep in negative balance-follow electrolytes closely.   Elevated troponins Secondary to  demand  ischemia Recent LHC negative Given his overall clinical situation-do not think he  is a candidate for aggressive care at this point.  PAF Continue amiodarone  Eliquis  resumed on 5/10-as outlined per most recent discharge summary.  Hypokalemia Replete/recheck.  CKD stage IIIa Creatinine at baseline Follow closely  Acute urinary retention on 5/9 BPH Continue Flomax  Foley catheter placed 5/9-voiding trial when clinically improved-if transitions to comfort measures-will keep Foley in for comfort.  Recent imaging consistent on prior admit-consistent with mild left-sided hydronephrosis This was seen incidentally on CT PE study. Foley catheter currently in place-CT renal stone study without any major hydronephrosis.  Elevated troponins Secondary to  demand ischemia Recent LHC negative Given his overall clinical situation-do not think he is a candidate for aggressive care at this point.  Dementia Supportive care Maintain delirium precautions  Chronic pain syndrome-narcotic dependence As needed narcotics for now.  Palliative care Remains very tenuous-still severely hypoxemic-on broad-spectrum IV antibiotics-IV Lasix -requiring a combination of heated high flow and BiPAP.  Very poor overall prognosis-best served by initiation of comfort measures.  Long discussion with spouse at bedside on 5/11-she is awaiting for daughter to arrive out of state-but seems to be leaning towards transitioning to comfort measures when family members get here.  Palliative care following.  The patient is critically ill with multiple organ system failure and requires high complexity decision making for assessment and support, frequent evaluation and titration of therapies, advanced monitoring, review of radiographic studies and interpretation of complex data.   Total Critical time spent equals 45 minutes   Code status:   Code Status: Limited: Do not attempt resuscitation (DNR) -DNR-LIMITED -Do Not  Intubate/DNI    DVT Prophylaxis: SCDs Start: 10/05/23 2019 apixaban  (ELIQUIS ) tablet 5 mg   Family Communication: Spouse-[name]-[phone] -updated over the phone on 5/9-understands tenuous situation- confirms DNR.  Understands that if patient were to deteriorate-apart from initiating hospice care-no further role in escalation of care.  She is aware of pending palliative care evaluation.   Disposition Plan: Status is: Inpatient Remains inpatient appropriate because: Severity of illness   Planned Discharge Destination:Skilled nursing facility   Diet: Diet Order             Diet regular Room service appropriate? Yes with Assist; Fluid consistency: Thin  Diet effective now                     Antimicrobial agents: Anti-infectives (From admission, onward)    Start     Dose/Rate Route Frequency Ordered Stop   10/07/23 1145  fluconazole (DIFLUCAN) tablet 200 mg        200 mg Oral Daily 10/07/23 1045 10/14/23 0959   10/06/23 2200  linezolid (ZYVOX) IVPB 600 mg  Status:  Discontinued        600 mg 300 mL/hr over 60 Minutes Intravenous Every 12 hours 10/06/23 0812 10/07/23 1034   10/06/23 2045  vancomycin  (VANCOCIN ) IVPB 1000 mg/200 mL premix  Status:  Discontinued        1,000 mg 200 mL/hr over 60 Minutes Intravenous Every 24 hours 10/05/23 2033 10/06/23 0812   10/06/23 0800  ceFEPIme  (MAXIPIME ) 2 g in sodium chloride  0.9 % 100 mL IVPB        2 g 200 mL/hr over 30 Minutes Intravenous Every 12 hours 10/05/23 2033     10/05/23 2030  metroNIDAZOLE  (FLAGYL ) IVPB 500 mg        500 mg 100 mL/hr over 60 Minutes Intravenous Every 12 hours 10/05/23 2022 10/12/23 2029   10/05/23 1845  vancomycin  (VANCOCIN ) IVPB 1000 mg/200 mL premix  Status:  Discontinued        1,000 mg 200 mL/hr over 60 Minutes Intravenous  Once 10/05/23 1831 10/05/23 1837   10/05/23 1845  ceFEPIme  (MAXIPIME ) 2 g in sodium chloride  0.9 % 100 mL IVPB        2 g 200 mL/hr over 30 Minutes Intravenous  Once 10/05/23  1831 10/05/23 1955   10/05/23 1845  vancomycin  (VANCOREADY) IVPB 1500 mg/300 mL        1,500 mg 150 mL/hr over 120 Minutes Intravenous  Once 10/05/23 1837 10/05/23 2210        MEDICATIONS: Scheduled Meds:  amiodarone   200 mg Oral Daily   apixaban   5 mg Oral BID   fluconazole  200 mg Oral Daily   furosemide   40 mg Intravenous Q12H   guaiFENesin   600 mg Oral BID    HYDROmorphone  (DILAUDID ) injection  1 mg Intravenous Q8H   HYDROmorphone   4 mg Oral Q8H   midodrine  5 mg Oral TID WC   polyethylene glycol  17 g Oral Daily   sertraline   25 mg Oral TID   sodium chloride  flush  3 mL Intravenous Q12H   tamsulosin   0.4 mg Oral QPC breakfast   Continuous Infusions:  ceFEPime  (MAXIPIME ) IV 2 g (10/08/23 0848)   metronidazole  500 mg (10/08/23 0849)   PRN Meds:.acetaminophen  **OR** acetaminophen , bisacodyl , levalbuterol , ondansetron  **OR** ondansetron  (ZOFRAN ) IV, saline, senna-docusate   I have personally reviewed following labs and imaging studies  LABORATORY DATA: CBC: Recent Labs  Lab 10/05/23 1651 10/05/23 2307 10/06/23 1021 10/07/23 0705 10/08/23 0638  WBC 25.9* 32.4* 32.2* 30.5* 28.4*  NEUTROABS 23.2*  --   --   --   --   HGB 11.1* 10.4* 10.3* 9.9* 9.4*  HCT 34.9* 31.5* 31.0* 30.0* 29.2*  MCV 94.1 93.2 92.0 92.9 95.7  PLT 251 222 223 227 246    Basic Metabolic Panel: Recent Labs  Lab 10/02/23 0601 10/03/23 1007 10/05/23 1651 10/05/23 2307 10/06/23 1021 10/07/23 0705 10/08/23 0638  NA 135   < > 133* 136 139 141 143  K 3.3*   < > 3.3* 3.0* 2.9* 2.9* 3.1*  CL 101   < > 95* 99 102 102 104  CO2 25   < > 23 24 24 27 26   GLUCOSE 93   < > 114* 115* 112* 119* 101*  BUN 13   < > 21 23 20 20  25*  CREATININE 1.16   < > 1.52* 1.58* 1.45* 1.30* 1.49*  CALCIUM 8.7*   < > 8.9 8.6* 8.6* 8.9 8.8*  MG 2.0  --   --   --  1.8  --  1.9   < > = values in this interval not displayed.    GFR: Estimated Creatinine Clearance: 42.8 mL/min (A) (by C-G formula based on SCr of 1.49  mg/dL (H)).  Liver Function Tests: Recent Labs  Lab 10/05/23 1651 10/05/23 2307 10/06/23 1021 10/07/23 0705 10/08/23 0638  AST 28 23 21 21 16   ALT 20 19 17 17 14   ALKPHOS 62 52 58 60 58  BILITOT 0.8 1.0 0.9 0.7 0.8  PROT 6.2* 5.7* 5.7* 5.6* 5.4*  ALBUMIN  2.6* 2.4* 2.3* 2.1* 2.0*   No results for input(s): "LIPASE", "AMYLASE" in the last 168 hours. No results for input(s): "AMMONIA" in the last 168 hours.  Coagulation Profile: No results for input(s): "INR", "PROTIME" in the last 168 hours.  Cardiac Enzymes: No results for input(s): "CKTOTAL", "CKMB", "CKMBINDEX", "TROPONINI" in the  last 168 hours.  BNP (last 3 results) No results for input(s): "PROBNP" in the last 8760 hours.  Lipid Profile: No results for input(s): "CHOL", "HDL", "LDLCALC", "TRIG", "CHOLHDL", "LDLDIRECT" in the last 72 hours.  Thyroid Function Tests: No results for input(s): "TSH", "T4TOTAL", "FREET4", "T3FREE", "THYROIDAB" in the last 72 hours.  Anemia Panel: No results for input(s): "VITAMINB12", "FOLATE", "FERRITIN", "TIBC", "IRON", "RETICCTPCT" in the last 72 hours.  Urine analysis:    Component Value Date/Time   COLORURINE AMBER (A) 10/05/2023 2112   APPEARANCEUR TURBID (A) 10/05/2023 2112   LABSPEC 1.011 10/05/2023 2112   PHURINE 5.0 10/05/2023 2112   GLUCOSEU NEGATIVE 10/05/2023 2112   HGBUR MODERATE (A) 10/05/2023 2112   BILIRUBINUR NEGATIVE 10/05/2023 2112   KETONESUR NEGATIVE 10/05/2023 2112   PROTEINUR 30 (A) 10/05/2023 2112   UROBILINOGEN 0.2 05/15/2011 1151   NITRITE NEGATIVE 10/05/2023 2112   LEUKOCYTESUR LARGE (A) 10/05/2023 2112    Sepsis Labs: Lactic Acid, Venous    Component Value Date/Time   LATICACIDVEN 1.7 10/05/2023 1900    MICROBIOLOGY: Recent Results (from the past 240 hours)  Culture, blood (routine x 2)     Status: None (Preliminary result)   Collection Time: 10/05/23  4:50 PM   Specimen: BLOOD  Result Value Ref Range Status   Specimen Description BLOOD  RIGHT ANTECUBITAL  Final   Special Requests   Final    BOTTLES DRAWN AEROBIC AND ANAEROBIC Blood Culture adequate volume   Culture   Final    NO GROWTH 3 DAYS Performed at [facility], [address], [place], [place] [address]    Report Status PENDING  Incomplete  Resp panel by RT-PCR (RSV, Flu A&B, Covid) Anterior Nasal Swab     Status: None   Collection Time: 10/05/23  8:17 PM   Specimen: Anterior Nasal Swab  Result Value Ref Range Status   SARS Coronavirus 2 by RT PCR NEGATIVE NEGATIVE Final   Influenza A by PCR NEGATIVE NEGATIVE Final   Influenza B by PCR NEGATIVE NEGATIVE Final    Comment: (NOTE) The Xpert Xpress SARS-CoV-2/FLU/RSV plus assay is intended as an aid in the diagnosis of influenza from Nasopharyngeal swab specimens and should not be used as a sole basis for treatment. Nasal washings and aspirates are unacceptable for Xpert Xpress SARS-CoV-2/FLU/RSV testing.  Fact Sheet for Patients: BloggerCourse.com  Fact Sheet for Healthcare Providers: SeriousBroker.it  This test is not yet approved or cleared by the [place]  FDA and has been authorized for detection and/or diagnosis of SARS-CoV-2 by FDA under an Emergency Use Authorization (EUA). This EUA will remain in effect (meaning this test can be used) for the duration of the COVID-19 declaration under Section 564(b)(1) of the Act, 21 U.S.C. section 360bbb-3(b)(1), unless the authorization is terminated or revoked.     Resp Syncytial Virus by PCR NEGATIVE NEGATIVE Final    Comment: (NOTE) Fact Sheet for Patients: BloggerCourse.com  Fact Sheet for Healthcare Providers: SeriousBroker.it  This test is not yet approved or cleared by the [place]  FDA and has been authorized for detection and/or diagnosis of SARS-CoV-2 by FDA under an Emergency Use Authorization (EUA). This EUA will remain in effect  (meaning this test can be used) for the duration of the COVID-19 declaration under Section 564(b)(1) of the Act, 21 U.S.C. section 360bbb-3(b)(1), unless the authorization is terminated or revoked.  Performed at [facility], [address], [place], [place] [address]   Urine Culture     Status:  Abnormal   Collection Time: 10/05/23  9:12 PM   Specimen: Urine, Random  Result Value Ref Range Status   Specimen Description URINE, RANDOM  Final   Special Requests   Final    NONE Reflexed from [id number] Performed at [facility], [address], [place], [place] [address]    Culture 70,000 COLONIES/mL YEAST (A)  Final   Report Status 10/06/2023 FINAL  Final  Culture, blood (routine x 2)     Status: None (Preliminary result)   Collection Time: 10/05/23 11:07 PM   Specimen: BLOOD LEFT HAND  Result Value Ref Range Status   Specimen Description BLOOD LEFT HAND  Final   Special Requests   Final    BOTTLES DRAWN AEROBIC AND ANAEROBIC Blood Culture adequate volume   Culture   Final    NO GROWTH 3 DAYS Performed at [facility], [address], [place], [place] [address]    Report Status PENDING  Incomplete  MRSA Next Gen by PCR, Nasal     Status: None   Collection Time: 10/06/23 12:26 PM   Specimen: Nasal Mucosa; Nasal Swab  Result Value Ref Range Status   MRSA by PCR Next Gen NOT DETECTED NOT DETECTED Final    Comment: (NOTE) The GeneXpert MRSA Assay (FDA approved for NASAL specimens only), is one component of a comprehensive MRSA colonization surveillance program. It is not intended to diagnose MRSA infection nor to guide or monitor treatment for MRSA infections. Test performance is not FDA approved in patients less than 72 years old. Performed at [facility], [address] [place], [place] [address]     RADIOLOGY STUDIES/RESULTS: DG Chest Port 1 View Result Date: 10/08/2023 CLINICAL DATA:  Shortness of breath. EXAM: PORTABLE CHEST 1 VIEW COMPARISON:   Radiographs 10/07/2023 and 10/05/2023. CT 10/02/2023 and 10/07/2023. FINDINGS: 0552 hours. The heart size and mediastinal contours are stable with aortic atherosclerosis. Persistent asymmetric elevation of the right hemidiaphragm with associated asymmetric right basilar atelectasis. Persistent diffusely increased ground-glass opacities compared with baseline, suspicious for superimposed interstitial edema or atypical infection. No evidence of pneumothorax, significant pleural effusion or worsening focal airspace disease. The bones appear unchanged with a thoracolumbar scoliosis. IMPRESSION: Persistent diffusely increased ground-glass opacities compared with baseline, suspicious for superimposed interstitial edema or atypical infection. No significant pleural effusion. Electronically Signed   By: Elmon Hagedorn M.D.   On: 10/08/2023 10:02   CT RENAL STONE STUDY Result Date: 10/07/2023 CLINICAL DATA:  Abdominal/flank pain, stone suspected Recent CT PE study-with left hydronephrosis-now here with severe sepsis-please assess for renal stone/obstruction. EXAM: CT ABDOMEN AND PELVIS WITHOUT CONTRAST TECHNIQUE: Multidetector CT imaging of the abdomen and pelvis was performed following the standard protocol without IV contrast. RADIATION DOSE REDUCTION: This exam was performed according to the departmental dose-optimization program which includes automated exposure control, adjustment of the mA and/or kV according to patient size and/or use of iterative reconstruction technique. COMPARISON:  Ct abd/pelvis 11/10/20, cxr 10/07/23 FINDINGS: Lower chest: Interval development of ground-glass airspace opacity and bronchiectasis throughout the visualized bilateral lungs with interval worsening of peribronchovascular consolidation of bilateral lower lobes. Atherosclerotic plaque. Cardiac changes suggestive of anemia. Hepatobiliary: No focal liver abnormality. Status post cholecystectomy. No biliary dilatation. Pancreas: No focal  lesion. Normal pancreatic contour. No surrounding inflammatory changes. No main pancreatic ductal dilatation. Spleen: Normal in size without focal abnormality. Adrenals/Urinary Tract: No adrenal nodule bilaterally. No nephrolithiasis and no hydronephrosis. No definite contour-deforming renal mass. No ureterolithiasis or hydroureter. Marked  circumferential urinary bladder wall thickening. Foley catheter tip inflated balloon terminate within the urinary bladder lumen. Stomach/Bowel: Stomach is within normal limits. No evidence of bowel wall thickening or dilatation. Colonic diverticulosis. Appendix appears normal. Vascular/Lymphatic: No abdominal aorta or iliac aneurysm. Severe atherosclerotic plaque of the aorta and its branches. No abdominal, pelvic, or inguinal lymphadenopathy. Reproductive: Prostate is unremarkable. Other: No intraperitoneal free fluid. No intraperitoneal free gas. No organized fluid collection. Musculoskeletal: No abdominal wall hernia or abnormality. No suspicious lytic or blastic osseous lesions. No acute displaced fracture. Levoscoliosis of the upper lumbar spine. Multilevel severe degenerative changes of the spine. Severe degenerative changes of the right hip with loss of joint space. IMPRESSION: 1. Interval development of ground-glass airspace opacity and bronchiectasis throughout the visualized bilateral lungs with interval worsening of peribronchovascular consolidation of bilateral lower lobes. 2. Cystitis. 3.  Aortic Atherosclerosis (ICD10-I70.0). 4. Limited evaluation due to motion artifact and noncontrast study. Electronically Signed   By: Morgane  Naveau M.D.   On: 10/07/2023 16:15   DG Chest Port 1V same Day Result Date: 10/07/2023 CLINICAL DATA:  Respiratory distress and shortness of breath. EXAM: PORTABLE CHEST 1 VIEW COMPARISON:  10/05/2023 FINDINGS: Stable cardiomediastinal contours. Aortic atherosclerotic calcifications. Unchanged asymmetric elevation of the right  hemidiaphragm. There has been interval worsening aeration to both lungs with progressive diffuse increase interstitial markings and ground-glass opacification throughout both lungs. Atelectasis along the elevated right hemidiaphragm appears increased from prior study. Progressive consolidative type change noted in the left base. Visualized osseous structures are unremarkable. IMPRESSION: 1. Interval worsening aeration to both lungs with progressive diffuse increase interstitial markings and ground-glass opacification throughout both lungs. Findings are compatible with worsening pulmonary edema versus atypical infection. 2. Progressive consolidative type change in the left base. 3. Unchanged asymmetric elevation of the right hemidiaphragm. Electronically Signed   By: Kimberley Penman M.D.   On: 10/07/2023 09:01     LOS: 3 days   Kimberly Penna, MD  [facility]    To contact the attending provider between 7A-7P or the covering provider during after hours 7P-7A, please log into the web site [email or web address] and access using universal [place] password for that web site. If you do not have the password, please call the hospital operator.  10/08/2023, 10:07 AM

## 2023-10-09 DIAGNOSIS — I5022 Chronic systolic (congestive) heart failure: Secondary | ICD-10-CM | POA: Diagnosis not present

## 2023-10-09 DIAGNOSIS — A419 Sepsis, unspecified organism: Secondary | ICD-10-CM | POA: Diagnosis not present

## 2023-10-09 DIAGNOSIS — J9601 Acute respiratory failure with hypoxia: Secondary | ICD-10-CM | POA: Diagnosis not present

## 2023-10-09 DIAGNOSIS — J189 Pneumonia, unspecified organism: Secondary | ICD-10-CM | POA: Diagnosis not present

## 2023-10-09 DIAGNOSIS — N1831 Chronic kidney disease, stage 3a: Secondary | ICD-10-CM | POA: Diagnosis not present

## 2023-10-09 DIAGNOSIS — R69 Illness, unspecified: Secondary | ICD-10-CM | POA: Diagnosis not present

## 2023-10-09 LAB — BASIC METABOLIC PANEL WITH GFR
Anion gap: 17 — ABNORMAL HIGH (ref 5–15)
BUN: 32 mg/dL — ABNORMAL HIGH (ref 8–23)
CO2: 24 mmol/L (ref 22–32)
Calcium: 8.9 mg/dL (ref 8.9–10.3)
Chloride: 106 mmol/L (ref 98–111)
Creatinine, Ser: 1.67 mg/dL — ABNORMAL HIGH (ref 0.61–1.24)
GFR, Estimated: 42 mL/min — ABNORMAL LOW (ref >60)
Glucose, Bld: 100 mg/dL — ABNORMAL HIGH (ref 70–99)
Potassium: 3.1 mmol/L — ABNORMAL LOW (ref 3.5–5.1)
Sodium: 147 mmol/L — ABNORMAL HIGH (ref 135–145)

## 2023-10-09 LAB — CBC
HCT: 33.6 % — ABNORMAL LOW (ref 39.0–52.0)
Hemoglobin: 10.8 g/dL — ABNORMAL LOW (ref 13.0–17.0)
MCH: 30.3 pg (ref 26.0–34.0)
MCHC: 32.1 g/dL (ref 30.0–36.0)
MCV: 94.1 fL (ref 80.0–100.0)
Platelets: 239 10*3/uL (ref 150–400)
RBC: 3.57 MIL/uL — ABNORMAL LOW (ref 4.22–5.81)
RDW: 14.3 % (ref 11.5–15.5)
WBC: 26.6 10*3/uL — ABNORMAL HIGH (ref 4.0–10.5)
nRBC: 0 % (ref 0.0–0.2)

## 2023-10-09 MED ORDER — CHLORHEXIDINE GLUCONATE CLOTH 2 % EX PADS: 6.0000 | MEDICATED_PAD | Freq: Every day | CUTANEOUS | Status: DC

## 2023-10-09 MED ORDER — FUROSEMIDE 10 MG/ML IJ SOLN
40.0000 mg | Freq: Every day | INTRAMUSCULAR | Status: DC
  Administered 2023-10-09 – 2023-10-10 (×2): 40 mg via INTRAVENOUS
  Filled 2023-10-09 (×2): qty 4

## 2023-10-09 MED ORDER — POTASSIUM CHLORIDE 10 MEQ/100ML IV SOLN
10.0000 meq | INTRAVENOUS | Status: AC
Start: 2023-10-09 — End: 2023-10-09
  Administered 2023-10-09 (×3): 10 meq via INTRAVENOUS
  Filled 2023-10-09 (×3): qty 100

## 2023-10-09 NOTE — Progress Notes (Signed)
 PROGRESS NOTE        PATIENT DETAILS Name: Brian Ruiz Age: 78 y.o. Sex: male Date of Birth: 1946-03-23 Admit Date: 10/05/2023 Admitting Physician Kenny Peals, MD MWN:[id number], [facility]  Brief Summary: Patient is a 78 y.o.  male with history of HFrEF, PAF, CKD stage IIIa, HTN, BPH, narcotic dependence-presented from SNF for fever/shortness of breath-found to have acute hypoxic respiratory failure secondary to PNA.  Started on BiPAP and admitted to the hospitalist service.  Significant events: 3/31-4/11>> hospitalization for hypoxia-secondary to HFrEF exacerbation with low output state/COVID-19 pneumonia. 4/30-5/6>> hospitalization for stercoral ulceration-bright red blood per rectum-possible aspiration pneumonia. 5/8>> admit to [facility] from SNF-fever/SOB-requiring BiPAP.  Empiric antibiotics-admit to PCU. 5/9>> acute urinary retention-purulent urine obtained when Foley catheter placed.  Significant studies: 4/3>> TTE: EF 35-40%. 4/4>> LHC: Normal coronary angiography. 5/3>> colonoscopy: Ulcerated mucosa proximal rectum/rectosigmoid-moderate diverticulosis.  Polyp in ascending colon/sigmoid colon. 5/5>> patchy opacities-left lung-chronic right hemidiaphragm. 5/10>> CT renal stone study: No hydronephrosis/stones.  Interval development of groundglass opacities/bronchiectasis throughout visualized bilateral lung.  Significant microbiology data: 5/8>> COVID/influenza/RSV PCR: Negative 5/8>> blood culture: No growth 5/8>> urine culture: Pending  Procedures: None  Consults: Pall care  Subjective: Unchanged-frail-still tenuous-comfortable on BiPAP.  No family at bedside this morning.  No major issues overnight.  Objective: Vitals: Blood pressure 110/64, pulse 82, temperature 98 F (36.7 C), temperature source Axillary, resp. rate (!) 23, height 5\' 11"  (1.803 m), weight 72.9 kg, SpO2 99%.   Exam: Awake/alert-frail/cachectic Chest: Few bibasilar  rales CVS: S1-S2 Abdomen: Soft nontender nondistended Extremities: No edema Neuro: Nonfocal but with generalized weakness.  Pertinent Labs/Radiology:    Latest Ref Rng & Units 10/09/2023    5:03 AM 10/08/2023    6:38 AM 10/07/2023    7:05 AM  CBC  WBC 4.0 - 10.5 K/uL 26.6  28.4  30.5   Hemoglobin 13.0 - 17.0 g/dL 03.4  9.4  9.9   Hematocrit 39.0 - 52.0 % 33.6  29.2  30.0   Platelets 150 - 400 K/uL 239  246  227     Lab Results  Component Value Date   NA 147 (H) 10/09/2023   K 3.1 (L) 10/09/2023   CL 106 10/09/2023   CO2 24 10/09/2023     Assessment/Plan: Acute hypoxic respiratory failure-likely secondary to PNA and possible HFrEF exacerbation Continues to be tenuous-still with severely hypoxemic requiring a combination of heated high flow and BiPAP Remains on cefepime /Flagyl /Lasix  Poor overall prognosis-discussed with spouse on 5/11-she is leaning to transition to comfort measures-and was awaiting for her daughter to arrive.  Palliative care following-will await further recommendations. In the interim-continue antibiotics/Lasix -mobilize as much as possible Encourage incentive spirometry/flutter valve use.    Severe sepsis-likely secondary to PNA+/-UTI (POA-severe leukocytosis-hypoxic/febrile) Sepsis physiology improving-but remains hypoxic and very tenuous. Continue cefepime /Flagyl  for PNA-fluconazole for yeast in urine (purulent urine on 5/9 Foley catheter placed) Continue supportive care.  Acute on chronic HFrEF Volume status seems stable Mild jump in creatinine today-change Lasix  to daily use Follow electrolytes Keep in negative balance is much as possible.   Elevated troponins Secondary to  demand ischemia Recent LHC negative Given his overall clinical situation-do not think he is a candidate for aggressive care at this point.  PAF Continue amiodarone  Eliquis  resumed on 5/10-as outlined per most recent discharge summary.  Hypokalemia Continue to  replete/recheck  Hypernatremia Encourage oral  intake Change Lasix  to daily dosing Recheck electrolytes tomorrow.  CKD stage IIIa Creatinine at baseline Follow closely  Acute urinary retention on 5/9 BPH Continue Flomax  Foley catheter placed 5/9-voiding trial when clinically improved-if transitions to comfort measures-will keep Foley in for comfort.  Recent imaging consistent on prior admit-consistent with mild left-sided hydronephrosis This was seen incidentally on CT PE study. Foley catheter currently in place-CT renal stone study without any major hydronephrosis.  Elevated troponins Secondary to  demand ischemia Recent LHC negative Given his overall clinical situation-do not think he is a candidate for aggressive care at this point.  Dementia Supportive care Maintain delirium precautions  Chronic pain syndrome-narcotic dependence As needed narcotics for now.  Palliative care Remains very tenuous-still hypoxemic-requiring BiPAP or heated high flow.  Family discussion in progress-awaiting arrival of family members from out of town before a firm decision can be made.  Spouse was leaning towards transitioning to full comfort measures as of 5/11.  Will await further recommendations from palliative care team.    The patient is critically ill with multiple organ system failure and requires high complexity decision making for assessment and support, frequent evaluation and titration of therapies, advanced monitoring, review of radiographic studies and interpretation of complex data.   Total Critical time spent equals 45 minutes   Code status:   Code Status: Limited: Do not attempt resuscitation (DNR) -DNR-LIMITED -Do Not Intubate/DNI    DVT Prophylaxis: SCDs Start: 10/05/23 2019 apixaban  (ELIQUIS ) tablet 5 mg   Family Communication: Spouse-[name]-[phone] -updated over the phone on 5/9-understands tenuous situation- confirms DNR.  Understands that if patient were to  deteriorate-apart from initiating hospice care-no further role in escalation of care.  She is aware of pending palliative care evaluation.   Disposition Plan: Status is: Inpatient Remains inpatient appropriate because: Severity of illness   Planned Discharge Destination:Skilled nursing facility   Diet: Diet Order             Diet regular Room service appropriate? Yes with Assist; Fluid consistency: Thin  Diet effective now                     Antimicrobial agents: Anti-infectives (From admission, onward)    Start     Dose/Rate Route Frequency Ordered Stop   10/07/23 1145  fluconazole (DIFLUCAN) tablet 200 mg        200 mg Oral Daily 10/07/23 1045 10/14/23 0959   10/06/23 2200  linezolid (ZYVOX) IVPB 600 mg  Status:  Discontinued        600 mg 300 mL/hr over 60 Minutes Intravenous Every 12 hours 10/06/23 0812 10/07/23 1034   10/06/23 2045  vancomycin  (VANCOCIN ) IVPB 1000 mg/200 mL premix  Status:  Discontinued        1,000 mg 200 mL/hr over 60 Minutes Intravenous Every 24 hours 10/05/23 2033 10/06/23 0812   10/06/23 0800  ceFEPIme  (MAXIPIME ) 2 g in sodium chloride  0.9 % 100 mL IVPB        2 g 200 mL/hr over 30 Minutes Intravenous Every 12 hours 10/05/23 2033     10/05/23 2030  metroNIDAZOLE  (FLAGYL ) IVPB 500 mg        500 mg 100 mL/hr over 60 Minutes Intravenous Every 12 hours 10/05/23 2022 10/12/23 2029   10/05/23 1845  vancomycin  (VANCOCIN ) IVPB 1000 mg/200 mL premix  Status:  Discontinued        1,000 mg 200 mL/hr over 60 Minutes Intravenous  Once 10/05/23 1831 10/05/23 1837   10/05/23  1845  ceFEPIme  (MAXIPIME ) 2 g in sodium chloride  0.9 % 100 mL IVPB        2 g 200 mL/hr over 30 Minutes Intravenous  Once 10/05/23 1831 10/05/23 1955   10/05/23 1845  vancomycin  (VANCOREADY) IVPB 1500 mg/300 mL        1,500 mg 150 mL/hr over 120 Minutes Intravenous  Once 10/05/23 1837 10/05/23 2210        MEDICATIONS: Scheduled Meds:  amiodarone   200 mg Oral Daily   apixaban    5 mg Oral BID   fluconazole  200 mg Oral Daily   furosemide   40 mg Intravenous Daily   guaiFENesin   600 mg Oral BID    HYDROmorphone  (DILAUDID ) injection  1 mg Intravenous Q8H   HYDROmorphone   4 mg Oral Q8H   midodrine  5 mg Oral TID WC   polyethylene glycol  17 g Oral Daily   sertraline   25 mg Oral TID   sodium chloride  flush  3 mL Intravenous Q12H   tamsulosin   0.4 mg Oral QPC breakfast   Continuous Infusions:  ceFEPime  (MAXIPIME ) IV 2 g (10/09/23 0938)   metronidazole  500 mg (10/08/23 2053)   potassium chloride  10 mEq (10/09/23 0938)   PRN Meds:.acetaminophen  **OR** acetaminophen , bisacodyl , levalbuterol , ondansetron  **OR** ondansetron  (ZOFRAN ) IV, saline, senna-docusate   I have personally reviewed following labs and imaging studies  LABORATORY DATA: CBC: Recent Labs  Lab 10/05/23 1651 10/05/23 2307 10/06/23 1021 10/07/23 0705 10/08/23 0638 10/09/23 0503  WBC 25.9* 32.4* 32.2* 30.5* 28.4* 26.6*  NEUTROABS 23.2*  --   --   --   --   --   HGB 11.1* 10.4* 10.3* 9.9* 9.4* 10.8*  HCT 34.9* 31.5* 31.0* 30.0* 29.2* 33.6*  MCV 94.1 93.2 92.0 92.9 95.7 94.1  PLT 251 222 223 227 246 239    Basic Metabolic Panel: Recent Labs  Lab 10/05/23 2307 10/06/23 1021 10/07/23 0705 10/08/23 0638 10/09/23 0503  NA 136 139 141 143 147*  K 3.0* 2.9* 2.9* 3.1* 3.1*  CL 99 102 102 104 106  CO2 24 24 27 26 24   GLUCOSE 115* 112* 119* 101* 100*  BUN 23 20 20  25* 32*  CREATININE 1.58* 1.45* 1.30* 1.49* 1.67*  CALCIUM 8.6* 8.6* 8.9 8.8* 8.9  MG  --  1.8  --  1.9  --     GFR: Estimated Creatinine Clearance: 38.2 mL/min (A) (by C-G formula based on SCr of 1.67 mg/dL (H)).  Liver Function Tests: Recent Labs  Lab 10/05/23 1651 10/05/23 2307 10/06/23 1021 10/07/23 0705 10/08/23 0638  AST 28 23 21 21 16   ALT 20 19 17 17 14   ALKPHOS 62 52 58 60 58  BILITOT 0.8 1.0 0.9 0.7 0.8  PROT 6.2* 5.7* 5.7* 5.6* 5.4*  ALBUMIN  2.6* 2.4* 2.3* 2.1* 2.0*   No results for input(s):  "LIPASE", "AMYLASE" in the last 168 hours. No results for input(s): "AMMONIA" in the last 168 hours.  Coagulation Profile: No results for input(s): "INR", "PROTIME" in the last 168 hours.  Cardiac Enzymes: No results for input(s): "CKTOTAL", "CKMB", "CKMBINDEX", "TROPONINI" in the last 168 hours.  BNP (last 3 results) No results for input(s): "PROBNP" in the last 8760 hours.  Lipid Profile: No results for input(s): "CHOL", "HDL", "LDLCALC", "TRIG", "CHOLHDL", "LDLDIRECT" in the last 72 hours.  Thyroid Function Tests: No results for input(s): "TSH", "T4TOTAL", "FREET4", "T3FREE", "THYROIDAB" in the last 72 hours.  Anemia Panel: No results for input(s): "VITAMINB12", "FOLATE", "FERRITIN", "TIBC", "IRON", "RETICCTPCT" in  the last 72 hours.  Urine analysis:    Component Value Date/Time   COLORURINE AMBER (A) 10/05/2023 2112   APPEARANCEUR TURBID (A) 10/05/2023 2112   LABSPEC 1.011 10/05/2023 2112   PHURINE 5.0 10/05/2023 2112   GLUCOSEU NEGATIVE 10/05/2023 2112   HGBUR MODERATE (A) 10/05/2023 2112   BILIRUBINUR NEGATIVE 10/05/2023 2112   KETONESUR NEGATIVE 10/05/2023 2112   PROTEINUR 30 (A) 10/05/2023 2112   UROBILINOGEN 0.2 05/15/2011 1151   NITRITE NEGATIVE 10/05/2023 2112   LEUKOCYTESUR LARGE (A) 10/05/2023 2112    Sepsis Labs: Lactic Acid, Venous    Component Value Date/Time   LATICACIDVEN 1.7 10/05/2023 1900    MICROBIOLOGY: Recent Results (from the past 240 hours)  Culture, blood (routine x 2)     Status: None (Preliminary result)   Collection Time: 10/05/23  4:50 PM   Specimen: BLOOD  Result Value Ref Range Status   Specimen Description BLOOD RIGHT ANTECUBITAL  Final   Special Requests   Final    BOTTLES DRAWN AEROBIC AND ANAEROBIC Blood Culture adequate volume   Culture   Final    NO GROWTH 4 DAYS Performed at [facility], [address], [place], [place] [address]    Report Status PENDING  Incomplete  Resp panel by RT-PCR (RSV, Flu A&B, Covid)  Anterior Nasal Swab     Status: None   Collection Time: 10/05/23  8:17 PM   Specimen: Anterior Nasal Swab  Result Value Ref Range Status   SARS Coronavirus 2 by RT PCR NEGATIVE NEGATIVE Final   Influenza A by PCR NEGATIVE NEGATIVE Final   Influenza B by PCR NEGATIVE NEGATIVE Final    Comment: (NOTE) The Xpert Xpress SARS-CoV-2/FLU/RSV plus assay is intended as an aid in the diagnosis of influenza from Nasopharyngeal swab specimens and should not be used as a sole basis for treatment. Nasal washings and aspirates are unacceptable for Xpert Xpress SARS-CoV-2/FLU/RSV testing.  Fact Sheet for Patients: BloggerCourse.com  Fact Sheet for Healthcare Providers: SeriousBroker.it  This test is not yet approved or cleared by the [place]  FDA and has been authorized for detection and/or diagnosis of SARS-CoV-2 by FDA under an Emergency Use Authorization (EUA). This EUA will remain in effect (meaning this test can be used) for the duration of the COVID-19 declaration under Section 564(b)(1) of the Act, 21 U.S.C. section 360bbb-3(b)(1), unless the authorization is terminated or revoked.     Resp Syncytial Virus by PCR NEGATIVE NEGATIVE Final    Comment: (NOTE) Fact Sheet for Patients: BloggerCourse.com  Fact Sheet for Healthcare Providers: SeriousBroker.it  This test is not yet approved or cleared by the [place]  FDA and has been authorized for detection and/or diagnosis of SARS-CoV-2 by FDA under an Emergency Use Authorization (EUA). This EUA will remain in effect (meaning this test can be used) for the duration of the COVID-19 declaration under Section 564(b)(1) of the Act, 21 U.S.C. section 360bbb-3(b)(1), unless the authorization is terminated or revoked.  Performed at [facility], [address] [place], [place] [address]   Urine Culture     Status: Abnormal    Collection Time: 10/05/23  9:12 PM   Specimen: Urine, Random  Result Value Ref Range Status   Specimen Description URINE, RANDOM  Final   Special Requests   Final    NONE Reflexed from [id number] Performed at [facility], [address], [place], [place] [address]    Culture 70,000 COLONIES/mL YEAST (A)  Final   Report  Status 10/06/2023 FINAL  Final  Culture, blood (routine x 2)     Status: None (Preliminary result)   Collection Time: 10/05/23 11:07 PM   Specimen: BLOOD LEFT HAND  Result Value Ref Range Status   Specimen Description BLOOD LEFT HAND  Final   Special Requests   Final    BOTTLES DRAWN AEROBIC AND ANAEROBIC Blood Culture adequate volume   Culture   Final    NO GROWTH 4 DAYS Performed at [facility], [address], [place], [place] [address]    Report Status PENDING  Incomplete  MRSA Next Gen by PCR, Nasal     Status: None   Collection Time: 10/06/23 12:26 PM   Specimen: Nasal Mucosa; Nasal Swab  Result Value Ref Range Status   MRSA by PCR Next Gen NOT DETECTED NOT DETECTED Final    Comment: (NOTE) The GeneXpert MRSA Assay (FDA approved for NASAL specimens only), is one component of a comprehensive MRSA colonization surveillance program. It is not intended to diagnose MRSA infection nor to guide or monitor treatment for MRSA infections. Test performance is not FDA approved in patients less than 67 years old. Performed at [facility], [address] [place], [place] [address]     RADIOLOGY STUDIES/RESULTS: DG Chest Port 1 View Result Date: 10/08/2023 CLINICAL DATA:  Shortness of breath. EXAM: PORTABLE CHEST 1 VIEW COMPARISON:  Radiographs 10/07/2023 and 10/05/2023. CT 10/02/2023 and 10/07/2023. FINDINGS: 0552 hours. The heart size and mediastinal contours are stable with aortic atherosclerosis. Persistent asymmetric elevation of the right hemidiaphragm with associated asymmetric right basilar atelectasis. Persistent diffusely increased  ground-glass opacities compared with baseline, suspicious for superimposed interstitial edema or atypical infection. No evidence of pneumothorax, significant pleural effusion or worsening focal airspace disease. The bones appear unchanged with a thoracolumbar scoliosis. IMPRESSION: Persistent diffusely increased ground-glass opacities compared with baseline, suspicious for superimposed interstitial edema or atypical infection. No significant pleural effusion. Electronically Signed   By: Elmon Hagedorn M.D.   On: 10/08/2023 10:02   CT RENAL STONE STUDY Result Date: 10/07/2023 CLINICAL DATA:  Abdominal/flank pain, stone suspected Recent CT PE study-with left hydronephrosis-now here with severe sepsis-please assess for renal stone/obstruction. EXAM: CT ABDOMEN AND PELVIS WITHOUT CONTRAST TECHNIQUE: Multidetector CT imaging of the abdomen and pelvis was performed following the standard protocol without IV contrast. RADIATION DOSE REDUCTION: This exam was performed according to the departmental dose-optimization program which includes automated exposure control, adjustment of the mA and/or kV according to patient size and/or use of iterative reconstruction technique. COMPARISON:  Ct abd/pelvis 11/10/20, cxr 10/07/23 FINDINGS: Lower chest: Interval development of ground-glass airspace opacity and bronchiectasis throughout the visualized bilateral lungs with interval worsening of peribronchovascular consolidation of bilateral lower lobes. Atherosclerotic plaque. Cardiac changes suggestive of anemia. Hepatobiliary: No focal liver abnormality. Status post cholecystectomy. No biliary dilatation. Pancreas: No focal lesion. Normal pancreatic contour. No surrounding inflammatory changes. No main pancreatic ductal dilatation. Spleen: Normal in size without focal abnormality. Adrenals/Urinary Tract: No adrenal nodule bilaterally. No nephrolithiasis and no hydronephrosis. No definite contour-deforming renal mass. No  ureterolithiasis or hydroureter. Marked circumferential urinary bladder wall thickening. Foley catheter tip inflated balloon terminate within the urinary bladder lumen. Stomach/Bowel: Stomach is within normal limits. No evidence of bowel wall thickening or dilatation. Colonic diverticulosis. Appendix appears normal. Vascular/Lymphatic: No abdominal aorta or iliac aneurysm. Severe atherosclerotic plaque of the aorta and its branches. No abdominal, pelvic, or inguinal lymphadenopathy. Reproductive: Prostate is unremarkable. Other: No intraperitoneal free fluid. No intraperitoneal  free gas. No organized fluid collection. Musculoskeletal: No abdominal wall hernia or abnormality. No suspicious lytic or blastic osseous lesions. No acute displaced fracture. Levoscoliosis of the upper lumbar spine. Multilevel severe degenerative changes of the spine. Severe degenerative changes of the right hip with loss of joint space. IMPRESSION: 1. Interval development of ground-glass airspace opacity and bronchiectasis throughout the visualized bilateral lungs with interval worsening of peribronchovascular consolidation of bilateral lower lobes. 2. Cystitis. 3.  Aortic Atherosclerosis (ICD10-I70.0). 4. Limited evaluation due to motion artifact and noncontrast study. Electronically Signed   By: Morgane  Naveau M.D.   On: 10/07/2023 16:15     LOS: 4 days   Kimberly Penna, MD  [facility]    To contact the attending provider between 7A-7P or the covering provider during after hours 7P-7A, please log into the web site [email or web address] and access using universal  password for that web site. If you do not have the password, please call the hospital operator.  10/09/2023, 9:57 AM

## 2023-10-09 NOTE — Progress Notes (Signed)
 Removed patient from BIPAP and placed on HHFNC 45L 80% FIO2. Tolerating well at this time.

## 2023-10-09 NOTE — Plan of Care (Signed)
 No changes

## 2023-10-09 NOTE — Progress Notes (Signed)
 SLP Cancellation Note  Patient Details Name: Brian Ruiz MRN: [id number] DOB: Nov 10, 1945   Cancelled treatment:       Reason Eval/Treat Not Completed: Medical issues which prohibited therapy (on BiPAP). SLP will continue following.    Amil Kale, M.A., CCC-SLP Speech Language Pathology, Acute Rehabilitation Services  Secure Chat preferred [phone]  10/09/2023, 1:03 PM

## 2023-10-09 NOTE — Progress Notes (Signed)
 Daily Progress Note   Patient Name: Brian Ruiz       Date: 10/09/2023 DOB: December 23, 1945  Age: 78 y.o. MRN#: [id number] Attending Physician: Burton Casey, MD Primary Care Physician: [facility] Admit Date: 10/05/2023  Reason for Consultation/Follow-up: Establishing goals of care  Subjective: Medical records reviewed including progress notes, labs and imaging. Patient assessed at the bedside.  He appears fatigued on BiPAP.  RT arrived to transition to high flow nasal cannula.  His wife is present visiting.  Created space and opportunity for family's thoughts and feelings on patient's current illness.  Brian Ruiz is emotional and understands the overall prognosis is quite poor, though she is trying to be strong for Brian Ruiz.  She shares that her children are now quite concerned as well, as they have not seen his decline in the past month and daughter's visit yesterday was very eye-opening.    Brian Ruiz anticipates a decision will need to be made in the next few days and prefers to reach out to PMT when she is ready to discuss in more detail at that time.  At her request, we discussed the ways she may approach this delicate conversation with patient.  She is glad to hear that he was somewhat open to the conversation when I spoke with him yesterday.  It is important to her that he is able to participate as much as possible and share his care preferences.  The last time she asked, he was wanting to fight to live.  We discussed the concept of changing focus and providing for his peace, comfort, dignity.  She ultimately wants him to have relief and wants to make sure that she proceeds cautiously and compassionately.  Questions and concerns addressed. PMT will continue to support holistically.   Length of Stay: 4   Physical Exam Vitals and nursing  note reviewed.  Constitutional:      General: He is not in acute distress.    Appearance: He is ill-appearing.  HENT:     Head: Normocephalic and atraumatic.  Cardiovascular:     Rate and Rhythm: Normal rate.  Pulmonary:     Effort: Tachypnea present.     Comments: HHFNC Neurological:     Mental Status: He is alert.          Vital Signs: BP 110/64   Pulse 82   Temp 98 F (36.7 C) (Axillary)   Resp (!) 23   Ht 5\' 11"  (1.803 m)   Wt 72.9 kg   SpO2 99%   BMI 22.42 kg/m  SpO2: SpO2: 99 % O2 Device: O2 Device: Bi-PAP O2 Flow  Rate: O2 Flow Rate (L/min): 55 L/min      Palliative Care Assessment & Plan   Patient Profile: 78 y.o. male  with past medical history of HFrEF (EF 30%), PAF on Eliquis  (recently on hold for rectal bleeding), CKD stage IIIa with left hydronephrosis, HTN, BPH, depression/anxiety, chronic pain with narcotic dependence admitted on 10/05/2023 with dyspnea.    Patient was recently hospitalized twice: 3/31 >> 4/11: acute hypoxic respiratory failure due to COVID-19 pneumonia.  Subsequently developed A-fib with RVR then low output cardiogenic shock requiring milrinone .  Found to have EF 20-25%.   4/30 >> 5/6: BRBPR and hematochezia from rectal ulceration seen on colonoscopy.  Felt secondary to stercoral ulceration.  Biopsy negative for significant pathology.  During admission there was also concern for aspiration pneumonia versus pneumonitis. He was discharged to [facility] on 1 week Augmentin .   He is now admitted for acute hypoxic respiratory failure secondary to PNA. Started on BiPAP and having difficulty coming off.    Assessment: Goals of care conversation HFrEF Acute hypoxic respiratory failure Possible acute on chronic HFrEF Severe sepsis UTI with yeast  Recommendations/Plan: Continue DNR/DNI Continue current care plan and allow more time for outcomes.  Patient's wife anticipates that a decision will need to be made in the next few days and  appreciates the space to process and discuss amongst family Ongoing goals of care discussions, possible transition to comfort care soon Psychosocial and emotional support provided PMT will await phone call from patient's wife when she is ready to discuss    Prognosis: Tenuous with high risk for further decline and decompensation    Discharge Planning: To Be Determined  Care plan was discussed with patient, patient's wife    Visit consisted of counseling and education dealing with the complex and emotionally intense issues of symptom management and palliative care in the setting of serious and potentially life-threatening illness. Greater than 50% of this time was spent counseling and coordinating care related to the above assessment and plan.  Personally spent 35 minutes in patient care including extensive chart review (labs, imaging, progress/consult notes, vital signs), medically appropriate exam, discussed with treatment team, education to patient, family, and staff, documenting clinical information, medication review and management, coordination of care, and available advanced directive documents.           Rollins Wrightson P Caldwell Kronenberger, PA-C  Palliative Medicine Team Team phone # [phone]  Thank you for allowing the Palliative Medicine Team to assist in the care of this patient. Please utilize secure chat with additional questions, if there is no response within 30 minutes please call the above phone number.  Palliative Medicine Team providers are available by phone from 7am to 7pm daily and can be reached through the team cell phone.  Should this patient require assistance outside of these hours, please call the patient's attending physician.

## 2023-10-10 DIAGNOSIS — N1831 Chronic kidney disease, stage 3a: Secondary | ICD-10-CM | POA: Diagnosis not present

## 2023-10-10 DIAGNOSIS — I5022 Chronic systolic (congestive) heart failure: Secondary | ICD-10-CM | POA: Diagnosis not present

## 2023-10-10 DIAGNOSIS — A419 Sepsis, unspecified organism: Secondary | ICD-10-CM | POA: Diagnosis not present

## 2023-10-10 DIAGNOSIS — J189 Pneumonia, unspecified organism: Secondary | ICD-10-CM | POA: Diagnosis not present

## 2023-10-10 DIAGNOSIS — J9601 Acute respiratory failure with hypoxia: Secondary | ICD-10-CM | POA: Diagnosis not present

## 2023-10-10 DIAGNOSIS — Z515 Encounter for palliative care: Secondary | ICD-10-CM | POA: Diagnosis not present

## 2023-10-10 LAB — BASIC METABOLIC PANEL WITH GFR
Anion gap: 13 (ref 5–15)
BUN: 43 mg/dL — ABNORMAL HIGH (ref 8–23)
CO2: 28 mmol/L (ref 22–32)
Calcium: 9.3 mg/dL (ref 8.9–10.3)
Chloride: 110 mmol/L (ref 98–111)
Creatinine, Ser: 1.93 mg/dL — ABNORMAL HIGH (ref 0.61–1.24)
GFR, Estimated: 35 mL/min — ABNORMAL LOW (ref >60)
Glucose, Bld: 120 mg/dL — ABNORMAL HIGH (ref 70–99)
Potassium: 3.2 mmol/L — ABNORMAL LOW (ref 3.5–5.1)
Sodium: 151 mmol/L — ABNORMAL HIGH (ref 135–145)

## 2023-10-10 LAB — CULTURE, BLOOD (ROUTINE X 2)
Culture: NO GROWTH
Culture: NO GROWTH
Special Requests: ADEQUATE
Special Requests: ADEQUATE

## 2023-10-10 LAB — CBC
HCT: 34.9 % — ABNORMAL LOW (ref 39.0–52.0)
Hemoglobin: 11.1 g/dL — ABNORMAL LOW (ref 13.0–17.0)
MCH: 30.5 pg (ref 26.0–34.0)
MCHC: 31.8 g/dL (ref 30.0–36.0)
MCV: 95.9 fL (ref 80.0–100.0)
Platelets: 262 10*3/uL (ref 150–400)
RBC: 3.64 MIL/uL — ABNORMAL LOW (ref 4.22–5.81)
RDW: 14.4 % (ref 11.5–15.5)
WBC: 25.3 10*3/uL — ABNORMAL HIGH (ref 4.0–10.5)
nRBC: 0 % (ref 0.0–0.2)

## 2023-10-10 LAB — MAGNESIUM: Magnesium: 2.2 mg/dL (ref 1.7–2.4)

## 2023-10-10 MED ORDER — AMIODARONE HCL IN DEXTROSE 360-4.14 MG/200ML-% IV SOLN: 60.0000 mg/h | INTRAVENOUS | Status: DC

## 2023-10-10 MED ORDER — LORAZEPAM 1 MG PO TABS: 1.0000 mg | ORAL_TABLET | ORAL | Status: DC | PRN

## 2023-10-10 MED ORDER — AMIODARONE HCL IN DEXTROSE 360-4.14 MG/200ML-% IV SOLN: 30.0000 mg/h | INTRAVENOUS | Status: DC

## 2023-10-10 MED ORDER — NYSTATIN 100000 UNIT/GM EX POWD: Freq: Three times a day (TID) | CUTANEOUS | Status: DC | PRN

## 2023-10-10 MED ORDER — HALOPERIDOL LACTATE 2 MG/ML PO CONC: 2.0000 mg | ORAL | Status: DC | PRN

## 2023-10-10 MED ORDER — LORAZEPAM 2 MG/ML PO CONC: 1.0000 mg | ORAL | Status: DC | PRN

## 2023-10-10 MED ORDER — SODIUM CHLORIDE 0.9% FLUSH
3.0000 mL | Freq: Two times a day (BID) | INTRAVENOUS | Status: DC
  Administered 2023-10-10: 3 mL via INTRAVENOUS

## 2023-10-10 MED ORDER — DIPHENHYDRAMINE HCL 50 MG/ML IJ SOLN
12.5000 mg | INTRAMUSCULAR | Status: DC | PRN
  Administered 2023-10-10: 12.5 mg via INTRAVENOUS
  Filled 2023-10-10: qty 1

## 2023-10-10 MED ORDER — HYDROMORPHONE BOLUS VIA INFUSION
1.0000 mg | INTRAVENOUS | Status: DC | PRN
  Administered 2023-10-10: 2 mg via INTRAVENOUS

## 2023-10-10 MED ORDER — HYDROMORPHONE BOLUS VIA INFUSION
1.0000 mg | INTRAVENOUS | Status: DC | PRN
  Administered 2023-10-10 (×2): 2 mg via INTRAVENOUS

## 2023-10-10 MED ORDER — ACETAMINOPHEN 325 MG PO TABS: 650.0000 mg | ORAL_TABLET | Freq: Four times a day (QID) | ORAL | Status: DC | PRN

## 2023-10-10 MED ORDER — AMIODARONE HCL IN DEXTROSE 360-4.14 MG/200ML-% IV SOLN
30.0000 mg/h | INTRAVENOUS | Status: DC
  Administered 2023-10-10: 30 mg/h via INTRAVENOUS
  Filled 2023-10-10: qty 200

## 2023-10-10 MED ORDER — ONDANSETRON HCL 4 MG/2ML IJ SOLN
4.0000 mg | Freq: Four times a day (QID) | INTRAMUSCULAR | Status: DC | PRN
Start: 2023-10-10 — End: 2023-10-10

## 2023-10-10 MED ORDER — GLYCOPYRROLATE 0.2 MG/ML IJ SOLN: 0.2000 mg | INTRAMUSCULAR | Status: DC | PRN

## 2023-10-10 MED ORDER — LORAZEPAM 2 MG/ML IJ SOLN
1.0000 mg | INTRAMUSCULAR | Status: DC | PRN
  Administered 2023-10-10: 2 mg via INTRAVENOUS
  Filled 2023-10-10: qty 1

## 2023-10-10 MED ORDER — HALOPERIDOL LACTATE 5 MG/ML IJ SOLN
2.0000 mg | INTRAMUSCULAR | Status: DC | PRN
  Administered 2023-10-10: 2 mg via INTRAVENOUS
  Filled 2023-10-10: qty 1

## 2023-10-10 MED ORDER — SODIUM CHLORIDE 0.9% FLUSH: 3.0000 mL | INTRAVENOUS | Status: DC | PRN

## 2023-10-10 MED ORDER — GLYCOPYRROLATE 0.2 MG/ML IJ SOLN
0.2000 mg | INTRAMUSCULAR | Status: DC | PRN
  Administered 2023-10-10: 0.2 mg via INTRAVENOUS
  Filled 2023-10-10: qty 1

## 2023-10-10 MED ORDER — POTASSIUM CHLORIDE 10 MEQ/100ML IV SOLN: 10.0000 meq | INTRAVENOUS | Status: DC

## 2023-10-10 MED ORDER — ACETAMINOPHEN 650 MG RE SUPP: 650.0000 mg | Freq: Four times a day (QID) | RECTAL | Status: DC | PRN

## 2023-10-10 MED ORDER — AMIODARONE HCL IN DEXTROSE 360-4.14 MG/200ML-% IV SOLN
60.0000 mg/h | INTRAVENOUS | Status: DC
  Administered 2023-10-10: 60 mg/h via INTRAVENOUS
  Filled 2023-10-10: qty 200

## 2023-10-10 MED ORDER — ALBUTEROL SULFATE (2.5 MG/3ML) 0.083% IN NEBU: 2.5000 mg | INHALATION_SOLUTION | RESPIRATORY_TRACT | Status: DC | PRN

## 2023-10-10 MED ORDER — LORAZEPAM 2 MG/ML IJ SOLN: 1.0000 mg | INTRAMUSCULAR | Status: DC | PRN

## 2023-10-10 MED ORDER — HYDROMORPHONE BOLUS VIA INFUSION: 0.5000 mg | INTRAVENOUS | Status: DC | PRN

## 2023-10-10 MED ORDER — HEPARIN (PORCINE) 25000 UT/250ML-% IV SOLN
1100.0000 [IU]/h | INTRAVENOUS | Status: DC
  Administered 2023-10-10: 1100 [IU]/h via INTRAVENOUS
  Filled 2023-10-10: qty 250

## 2023-10-10 MED ORDER — SODIUM CHLORIDE 0.9 % IV SOLN: 250.0000 mL | INTRAVENOUS | Status: DC | PRN

## 2023-10-10 MED ORDER — GLYCOPYRROLATE 1 MG PO TABS: 1.0000 mg | ORAL_TABLET | ORAL | Status: DC | PRN

## 2023-10-10 MED ORDER — ONDANSETRON 4 MG PO TBDP: 4.0000 mg | ORAL_TABLET | Freq: Four times a day (QID) | ORAL | Status: DC | PRN

## 2023-10-10 MED ORDER — MAGIC MOUTHWASH W/LIDOCAINE: 15.0000 mL | Freq: Four times a day (QID) | ORAL | Status: DC | PRN

## 2023-10-10 MED ORDER — HYDROMORPHONE HCL-NACL 50-0.9 MG/50ML-% IV SOLN
2.0000 mg/h | INTRAVENOUS | Status: DC
  Administered 2023-10-10: 2 mg/h via INTRAVENOUS
  Filled 2023-10-10: qty 50

## 2023-10-10 MED ORDER — HALOPERIDOL 1 MG PO TABS: 2.0000 mg | ORAL_TABLET | ORAL | Status: DC | PRN

## 2023-10-29 NOTE — TOC Progression Note (Signed)
 Transition of Care [facility]) - Progression Note    Patient Details  Name: Brian Ruiz MRN: [id number] Date of Birth: Mar 18, 1946  [facility] CM/SW Contact  Jannice Mends, LCSW Phone Number: 10/14/2023, 12:08 PM  Clinical Narrative:    CSW continuing to follow. Patient to transition to comfort care once daughter visits.    Expected Discharge Plan:  (Comfort care) Barriers to Discharge: Continued Medical Work up  Expected Discharge Plan and Services In-house Referral: Clinical Social Work     Living arrangements for the past 2 months: Skilled Nursing Facility                                       Social Determinants of Health (SDOH) Interventions SDOH Screenings   Food Insecurity: No Food Insecurity (10/05/2023)  Housing: Low Risk  (10/05/2023)  Transportation Needs: No Transportation Needs (10/05/2023)  Utilities: Not At Risk (10/05/2023)  Financial Resource Strain: Low Risk  (02/08/2021)   Received from [facility]  Physical Activity: Inactive (02/08/2021)   Received from [facility], [facility]  Social Connections: Moderately Isolated (10/08/2023)  Stress: No Stress Concern Present (02/08/2021)   [facility]  Tobacco Use: Low Risk  (09/30/2023)    Readmission Risk Interventions    08/30/2023   12:30 PM  Readmission Risk Prevention Plan  Post Dischage Appt Complete  Medication Screening Complete  Transportation Screening Complete

## 2023-10-29 NOTE — Progress Notes (Signed)
 PHARMACY - ANTICOAGULATION CONSULT NOTE  Pharmacy Consult for eliquis  >> heparin  Indication: atrial fibrillation  Allergies  Allergen Reactions   Nsaids Shortness Of Breath and Other (See Comments)    Bronchospasm    Roxicodone  [Oxycodone ] Other (See Comments)    Psychomotor agitation   Tolectin [Tolmetin] Shortness Of Breath   Ultram [Tramadol] Anaphylaxis   Actiq  [Fentanyl ] Other (See Comments)    Unknown reaction   Codeine Anxiety and Other (See Comments)    Bronchospasm Irritability     Patient Measurements: Height: 5\' 11"  (180.3 cm) Weight: 72.9 kg (160 lb 11.5 oz) IBW/kg (Calculated) : 75.3 HEPARIN  DW (KG): 72.9  Vital Signs: Temp: 97.8 F (36.6 C) (05/13 0000) Temp Source: Axillary (05/13 0000) BP: 108/76 (05/13 0000) Pulse Rate: 84 (05/13 0000)  Labs: Recent Labs    10/07/23 0705 10/08/23 0638 10/09/23 0503  HGB 9.9* 9.4* 10.8*  HCT 30.0* 29.2* 33.6*  PLT 227 246 239  CREATININE 1.30* 1.49* 1.67*    Estimated Creatinine Clearance: 38.2 mL/min (A) (by C-G formula based on SCr of 1.67 mg/dL (H)).   Medical History: Past Medical History:  Diagnosis Date   Arthritis    Back pain, chronic    intolerant to narcotics, avoids NSAIDs due to vioxx related bleed,  sees pain clinic as [place], prior eval by [facility] neuro in [place]   Cholecystitis    Choledocholithiasis    Chronic diastolic CHF (congestive heart failure) (HCC) 05/16/2018   Per Dr Letta Raw note in 2019, pt did not have heart failure but hypertensive heart disease.  Azzie Bollman FNP-C   Encephalopathy acute 05/15/2011   Heart murmur    Hypertension    Inguinal hernia    Polyneuropathy    S/P thyroidectomy 1967   Scoliosis    Urinary retention     Medications:   -Eliquis  5mg  PO BID (LD 5/12 ~0800)  Assessment: 73 yoM admitted for respiratory failure secondary to pneumonia and HF exacerbation requiring BiPAP. Pharmacy consulted to dose heparin  for atrial fibrillation given patient is NPO  and unable to take eliquis .  -Hgb 10.8, plts 239 -Will monitor with aPTTs given heparin  levels will be falsely elevated  Goal of Therapy:  Heparin  level 0.3-0.7 units/ml aPTT 66-102 seconds Monitor platelets by anticoagulation protocol: Yes   Plan:  -Start heparin  infusion at 1100 units/hr -aPTT in 8h -Monitor with aPTTs until correlates with heparin  levels -CBC daily   Young Hensen, PharmD, BCPS Clinical Pharmacist 10/11/2023 2:34 AM

## 2023-10-29 NOTE — Progress Notes (Signed)
 Palliative Medicine Progress Note   Patient Name: Brian Ruiz       Date: 10/09/2023 DOB: 04/28/1946  Age: 78 y.o. MRN#: [id number] Attending Physician: Burton Casey, MD Primary Care Physician: [facility] Admit Date: 10/05/2023  Reason for Consultation/Follow-up: {Reason for Consult:23484}  HPI/Patient Profile: 78 y.o. male  with past medical history of HFrEF (EF 30%), PAF on Eliquis  (recently on hold for rectal bleeding), CKD stage IIIa with left hydronephrosis, HTN, BPH, depression/anxiety, chronic pain with narcotic dependence admitted on 10/05/2023 with dyspnea.    Patient was recently hospitalized twice: 3/31 >> 4/11: acute hypoxic respiratory failure due to COVID-19 pneumonia.  Subsequently developed A-fib with RVR then low output cardiogenic shock requiring milrinone .  Found to have EF 20-25%.   4/30 >> 5/6: BRBPR and hematochezia from rectal ulceration seen on colonoscopy.  Felt secondary to stercoral ulceration.  Biopsy negative for significant pathology.  During admission there was also concern for aspiration pneumonia versus pneumonitis. He was discharged to [facility] on 1 week Augmentin .   He is now admitted for acute hypoxic respiratory failure secondary to PNA. Started on BiPAP and having difficulty coming off.  Subjective: ***  Objective:  Physical Exam          Vital Signs: BP (!) 141/83   Pulse 96   Temp 98.7 F (37.1 C) (Axillary)   Resp 19   Ht 5\' 11"  (1.803 m)   Wt 72.9 kg   SpO2 90%   BMI 22.42 kg/m  SpO2: SpO2: 90 % O2 Device: O2 Device: Heated High Flow Nasal Cannula O2 Flow Rate: O2 Flow Rate (L/min): 45 L/min  Intake/output summary:  Intake/Output Summary (Last 24 hours) at 10/07/2023 1545 Last data filed at 10/14/2023 0353 Gross  per 24 hour  Intake 1749.9 ml  Output 900 ml  Net 849.9 ml    LBM: Last BM Date : 10/05/23     Palliative Assessment/Data: ***     Palliative Medicine Assessment & Plan   Assessment: Principal Problem:   Severe sepsis (HCC) Active Problems:   Chronic pain syndrome   BPH (benign prostatic hyperplasia)   Depression with anxiety   Acute respiratory failure with hypoxia (HCC)   Stage 3a chronic kidney disease (CKD) (HCC)   Chronic HFrEF (heart failure with reduced ejection fraction) (HCC)  Elevated troponin    Recommendations/Plan: ***  Goals of Care and Additional Recommendations: Limitations on Scope of Treatment: {Recommended Scope and Preferences:21019}  Code Status:   Prognosis:  {Palliative Care Prognosis:23504}  Discharge Planning: {Palliative dispostion:23505}  Care plan was discussed with ***  Thank you for allowing the Palliative Medicine Team to assist in the care of this patient.   ***   Wynetta Heckle, NP   Please contact Palliative Medicine Team phone at [phone] for questions and concerns.  For individual providers, please see AMION.

## 2023-10-29 NOTE — Progress Notes (Signed)
 Patient wife expressed her transition towards comfort care, Labs refused at this time. Awaiting daughter to come see patient.

## 2023-10-29 NOTE — Care Plan (Signed)
 Dilaudid  gtt. 35ml wasted with witness Josem Nick, RN

## 2023-10-29 NOTE — Progress Notes (Addendum)
 PROGRESS NOTE        PATIENT DETAILS Name: Brian Ruiz Age: 78 y.o. Sex: male Date of Birth: [birth year] Admit Date: 10/05/2023 Admitting Physician Kenny Peals, MD ZOX:[id number], [facility]  Brief Summary: Patient is a 78 y.o.  male with history of HFrEF, PAF, CKD stage IIIa, HTN, BPH, narcotic dependence-presented from SNF for fever/shortness of breath-found to have acute hypoxic respiratory failure secondary to PNA.  Started on BiPAP and admitted to the hospitalist service.  Significant events: 3/31-4/11>> hospitalization for hypoxia-secondary to HFrEF exacerbation with low output state/COVID-19 pneumonia. 4/30-5/6>> hospitalization for stercoral ulceration-bright red blood per rectum-possible aspiration pneumonia. 5/8>> admit to [facility] from SNF-fever/SOB-requiring BiPAP.  Empiric antibiotics-admit to PCU. 5/9>> acute urinary retention-purulent urine obtained when Foley catheter placed.  Significant studies: 4/3>> TTE: EF 35-40%. 4/4>> LHC: Normal coronary angiography. 5/3>> colonoscopy: Ulcerated mucosa proximal rectum/rectosigmoid-moderate diverticulosis.  Polyp in ascending colon/sigmoid colon. 5/5>> patchy opacities-left lung-chronic right hemidiaphragm. 5/10>> CT renal stone study: No hydronephrosis/stones.  Interval development of groundglass opacities/bronchiectasis throughout visualized bilateral lung.  Significant microbiology data: 5/8>> COVID/influenza/RSV PCR: Negative 5/8>> blood culture: No growth 5/8>> urine culture: Pending  Procedures: None  Consults: Pall care  Subjective: Continues to be very tenuous-discussed with nursing staff-he did not tolerate being off BiPAP yesterday for a long period of time-back on BiPAP.  A-fib RVR overnight-started on amiodarone  infusion.  Discussed with patient-he understands that he is not doing well-spoke with spouse-she is on the way to the hospital.  Objective: Vitals: Blood pressure 131/77,  pulse 87, temperature 99 F (37.2 C), temperature source Axillary, resp. rate (!) 43, height 5\' 11"  (1.803 m), weight 72.9 kg, SpO2 96%.   Exam: Awake/alert-very frail/cachectic appearing on BiPAP Chest: Bibasilar rales up to mid lung CVS: S1-S2 regular-slightly tachycardic Abdomen: Soft nontender nondistended Extremities: No edema Neurology: Nonfocal-but with generalized weakness.  Pertinent Labs/Radiology:    Latest Ref Rng & Units 10/19/2023    4:26 AM 10/09/2023    5:03 AM 10/08/2023    6:38 AM  CBC  WBC 4.0 - 10.5 K/uL 25.3  26.6  28.4   Hemoglobin 13.0 - 17.0 g/dL 11.9  14.7  9.4   Hematocrit 39.0 - 52.0 % 34.9  33.6  29.2   Platelets 150 - 400 K/uL 262  239  246     Lab Results  Component Value Date   NA 151 (H) 10/04/2023   K 3.2 (L) 10/26/2023   CL 110 10/03/2023   CO2 28 10/12/2023     Assessment/Plan: Acute hypoxic respiratory failure-likely secondary to PNA and possible HFrEF exacerbation Unfortunately-no major improvement over the past several days-still requiring mostly BiPAP-though will tolerate being off BiPAP for a few hours-on heated high flow Continues to have leukocytosis-although slowly downtrending Continue cefepime /Flagyl  Holding Lasix  due to development of AKI/hypernatremia. Encourage incentive spirometry/flutter valve use.   See palliative discussion below.  Severe sepsis-likely secondary to PNA+/-UTI (POA-severe leukocytosis-hypoxic/febrile) Although sepsis physiology has improved some-patient remains very tenuous-very hypoxic No significant clinical improvement over the past several days Remains on cefepime /Flagyl  for PNA-fluconazole for funguria (purulent urine on 5/9 when Foley catheter placed).   See palliative discussion below.  Acute on chronic HFrEF Volume status stable Holding Lasix  today due to worsening creatinine hyponatremia.    Elevated troponins Secondary to  demand ischemia Recent LHC negative Given his overall clinical  situation-do not think he  is a candidate for aggressive care at this point.  PAF with RVR Developed RVR overnight-now on amiodarone  infusion Not able to take any oral anticoagulation-hence on IV heparin .  Continue amiodarone  Note-after review of most recent discharge summary-and hence anticoagulation was resumed on 5/10.   Hypokalemia Will continue to replete/recheck  Hypernatremia Secondary to very poor oral intake for the past several days-and IV Lasix  Holding IV Lasix  Encourage oral intake-but challenging given that he is unable to come off of BiPAP for a long period of time.  AKI on CKD stage IIIa Creatinine gradually worsening over the past several days-suspect this is hemodynamically mediated in the setting of ongoing sepsis physiology/pneumonia-and diuretic use Holding Lasix   Acute urinary retention on 5/9 BPH Continue Flomax  Foley catheter placed 5/9-voiding trial when clinically improved-if transitions to comfort measures-will keep Foley in for comfort.  Recent imaging consistent on prior admit-consistent with mild left-sided hydronephrosis This was seen incidentally on CT PE study. Foley catheter currently in place-CT renal stone study without any major hydronephrosis.  Elevated troponins Secondary to  demand ischemia Recent LHC negative Given his overall clinical situation-do not think he is a candidate for aggressive care at this point.  Dementia Supportive care Maintain delirium precautions  Chronic pain syndrome-narcotic dependence As needed narcotics for now.  Palliative care Remains very tenuous-still very hypoxemic-unfortunately no improvement in spite of diuretics/broad-spectrum antibiotics for the past several days-I suspect he is heading in the wrong direction-at this point best benefits from being transitioned to full comfort measures.  I have reached out to spouse earlier today-she is on the way to the hospital-will attempt to have a goals of care  conversation.  DNR in place-patient/family well aware from prior discussions that he is not a candidate for any further escalation in care-and that if he were to deteriorate-hospice care would be appropriate.  Addendum Another long discussion with spouse at bedside-she understands that patient is slowly "dying".  Understands that we need to transition to comfort measures.  We briefly discussed what comfort measures would mean-and that his life expectancy would probably not be more than a few hours/days.  She is in the process of trying to get in touch with the daughter to see if her daughter would want to come to the hospital to say goodbye.  I have asked her to let us  know when she is ready to transition to comfort measures.  Addendum Informed by RN-the patient's daughter is now at bedside-family is now agreeable to transition to full comfort measures.  Placing orders for full comfort care.  Expect inpatient death at this point.  The patient is critically ill with multiple organ system failure and requires high complexity decision making for assessment and support, frequent evaluation and titration of therapies, advanced monitoring, review of radiographic studies and interpretation of complex data.   Total Critical time spent equals 45 minutes   Code status:   Code Status: Limited: Do not attempt resuscitation (DNR) -DNR-LIMITED -Do Not Intubate/DNI    DVT Prophylaxis: SCDs Start: 10/05/23 2019   Family Communication: Spouse-[name]-[phone] -updated over the phone 5/13.  Disposition Plan: Status is: Inpatient Remains inpatient appropriate because: Severity of illness   Planned Discharge Destination:Skilled nursing facility versus residential hospice   Diet: Diet Order             Diet regular Room service appropriate? Yes with Assist; Fluid consistency: Thin  Diet effective now  Antimicrobial agents: Anti-infectives (From admission, onward)    Start      Dose/Rate Route Frequency Ordered Stop   10/07/23 1145  fluconazole (DIFLUCAN) tablet 200 mg        200 mg Oral Daily 10/07/23 1045 10/14/23 0959   10/06/23 2200  linezolid (ZYVOX) IVPB 600 mg  Status:  Discontinued        600 mg 300 mL/hr over 60 Minutes Intravenous Every 12 hours 10/06/23 0812 10/07/23 1034   10/06/23 2045  vancomycin  (VANCOCIN ) IVPB 1000 mg/200 mL premix  Status:  Discontinued        1,000 mg 200 mL/hr over 60 Minutes Intravenous Every 24 hours 10/05/23 2033 10/06/23 0812   10/06/23 0800  ceFEPIme  (MAXIPIME ) 2 g in sodium chloride  0.9 % 100 mL IVPB        2 g 200 mL/hr over 30 Minutes Intravenous Every 12 hours 10/05/23 2033     10/05/23 2030  metroNIDAZOLE  (FLAGYL ) IVPB 500 mg        500 mg 100 mL/hr over 60 Minutes Intravenous Every 12 hours 10/05/23 2022 10/12/23 2029   10/05/23 1845  vancomycin  (VANCOCIN ) IVPB 1000 mg/200 mL premix  Status:  Discontinued        1,000 mg 200 mL/hr over 60 Minutes Intravenous  Once 10/05/23 1831 10/05/23 1837   10/05/23 1845  ceFEPIme  (MAXIPIME ) 2 g in sodium chloride  0.9 % 100 mL IVPB        2 g 200 mL/hr over 30 Minutes Intravenous  Once 10/05/23 1831 10/05/23 1955   10/05/23 1845  vancomycin  (VANCOREADY) IVPB 1500 mg/300 mL        1,500 mg 150 mL/hr over 120 Minutes Intravenous  Once 10/05/23 1837 10/05/23 2210        MEDICATIONS: Scheduled Meds:  Chlorhexidine  Gluconate Cloth  6 each Topical Daily   fluconazole  200 mg Oral Daily   furosemide   40 mg Intravenous Daily   guaiFENesin   600 mg Oral BID    HYDROmorphone  (DILAUDID ) injection  1 mg Intravenous Q8H   HYDROmorphone   4 mg Oral Q8H   midodrine  5 mg Oral TID WC   polyethylene glycol  17 g Oral Daily   sertraline   25 mg Oral TID   sodium chloride  flush  3 mL Intravenous Q12H   tamsulosin   0.4 mg Oral QPC breakfast   Continuous Infusions:  amiodarone  60 mg/hr (10/14/2023 0353)   amiodarone  30 mg/hr (10/19/2023 0843)   ceFEPime  (MAXIPIME ) IV Stopped (10/09/23  2028)   heparin  1,100 Units/hr (10/02/2023 0353)   metronidazole  Stopped (10/09/23 2136)   PRN Meds:.acetaminophen  **OR** acetaminophen , bisacodyl , levalbuterol , ondansetron  **OR** ondansetron  (ZOFRAN ) IV, saline, senna-docusate   I have personally reviewed following labs and imaging studies  LABORATORY DATA: CBC: Recent Labs  Lab 10/05/23 1651 10/05/23 2307 10/06/23 1021 10/07/23 0705 10/08/23 0638 10/09/23 0503 10/23/2023 0426  WBC 25.9*   < > 32.2* 30.5* 28.4* 26.6* 25.3*  NEUTROABS 23.2*  --   --   --   --   --   --   HGB 11.1*   < > 10.3* 9.9* 9.4* 10.8* 11.1*  HCT 34.9*   < > 31.0* 30.0* 29.2* 33.6* 34.9*  MCV 94.1   < > 92.0 92.9 95.7 94.1 95.9  PLT 251   < > 223 227 246 239 262   < > = values in this interval not displayed.    Basic Metabolic Panel: Recent Labs  Lab 10/06/23 1021 10/07/23 0705 10/08/23 4098 10/09/23  0503 10/25/2023 0426  NA 139 141 143 147* 151*  K 2.9* 2.9* 3.1* 3.1* 3.2*  CL 102 102 104 106 110  CO2 24 27 26 24 28   GLUCOSE 112* 119* 101* 100* 120*  BUN 20 20 25* 32* 43*  CREATININE 1.45* 1.30* 1.49* 1.67* 1.93*  CALCIUM 8.6* 8.9 8.8* 8.9 9.3  MG 1.8  --  1.9  --  2.2    GFR: Estimated Creatinine Clearance: 33.1 mL/min (A) (by C-G formula based on SCr of 1.93 mg/dL (H)).  Liver Function Tests: Recent Labs  Lab 10/05/23 1651 10/05/23 2307 10/06/23 1021 10/07/23 0705 10/08/23 0638  AST 28 23 21 21 16   ALT 20 19 17 17 14   ALKPHOS 62 52 58 60 58  BILITOT 0.8 1.0 0.9 0.7 0.8  PROT 6.2* 5.7* 5.7* 5.6* 5.4*  ALBUMIN  2.6* 2.4* 2.3* 2.1* 2.0*   No results for input(s): "LIPASE", "AMYLASE" in the last 168 hours. No results for input(s): "AMMONIA" in the last 168 hours.  Coagulation Profile: No results for input(s): "INR", "PROTIME" in the last 168 hours.  Cardiac Enzymes: No results for input(s): "CKTOTAL", "CKMB", "CKMBINDEX", "TROPONINI" in the last 168 hours.  BNP (last 3 results) No results for input(s): "PROBNP" in the last  8760 hours.  Lipid Profile: No results for input(s): "CHOL", "HDL", "LDLCALC", "TRIG", "CHOLHDL", "LDLDIRECT" in the last 72 hours.  Thyroid Function Tests: No results for input(s): "TSH", "T4TOTAL", "FREET4", "T3FREE", "THYROIDAB" in the last 72 hours.  Anemia Panel: No results for input(s): "VITAMINB12", "FOLATE", "FERRITIN", "TIBC", "IRON", "RETICCTPCT" in the last 72 hours.  Urine analysis:    Component Value Date/Time   COLORURINE AMBER (A) 10/05/2023 2112   APPEARANCEUR TURBID (A) 10/05/2023 2112   LABSPEC 1.011 10/05/2023 2112   PHURINE 5.0 10/05/2023 2112   GLUCOSEU NEGATIVE 10/05/2023 2112   HGBUR MODERATE (A) 10/05/2023 2112   BILIRUBINUR NEGATIVE 10/05/2023 2112   KETONESUR NEGATIVE 10/05/2023 2112   PROTEINUR 30 (A) 10/05/2023 2112   UROBILINOGEN 0.2 05/15/2011 1151   NITRITE NEGATIVE 10/05/2023 2112   LEUKOCYTESUR LARGE (A) 10/05/2023 2112    Sepsis Labs: Lactic Acid, Venous    Component Value Date/Time   LATICACIDVEN 1.7 10/05/2023 1900    MICROBIOLOGY: Recent Results (from the past 240 hours)  Culture, blood (routine x 2)     Status: None   Collection Time: 10/05/23  4:50 PM   Specimen: BLOOD  Result Value Ref Range Status   Specimen Description BLOOD RIGHT ANTECUBITAL  Final   Special Requests   Final    BOTTLES DRAWN AEROBIC AND ANAEROBIC Blood Culture adequate volume   Culture   Final    NO GROWTH 5 DAYS Performed at [facility], [address], [place], [place] [address]    Report Status 10/28/2023 FINAL  Final  Resp panel by RT-PCR (RSV, Flu A&B, Covid) Anterior Nasal Swab     Status: None   Collection Time: 10/05/23  8:17 PM   Specimen: Anterior Nasal Swab  Result Value Ref Range Status   SARS Coronavirus 2 by RT PCR NEGATIVE NEGATIVE Final   Influenza A by PCR NEGATIVE NEGATIVE Final   Influenza B by PCR NEGATIVE NEGATIVE Final    Comment: (NOTE) The Xpert Xpress SARS-CoV-2/FLU/RSV plus assay is intended as an aid in the diagnosis  of influenza from Nasopharyngeal swab specimens and should not be used as a sole basis for treatment. Nasal washings and aspirates are unacceptable for Xpert Xpress SARS-CoV-2/FLU/RSV testing.  Fact  Sheet for Patients: BloggerCourse.com  Fact Sheet for Healthcare Providers: SeriousBroker.it  This test is not yet approved or cleared by the [place]  FDA and has been authorized for detection and/or diagnosis of SARS-CoV-2 by FDA under an Emergency Use Authorization (EUA). This EUA will remain in effect (meaning this test can be used) for the duration of the COVID-19 declaration under Section 564(b)(1) of the Act, 21 U.S.C. section 360bbb-3(b)(1), unless the authorization is terminated or revoked.     Resp Syncytial Virus by PCR NEGATIVE NEGATIVE Final    Comment: (NOTE) Fact Sheet for Patients: BloggerCourse.com  Fact Sheet for Healthcare Providers: SeriousBroker.it  This test is not yet approved or cleared by the [place]  FDA and has been authorized for detection and/or diagnosis of SARS-CoV-2 by FDA under an Emergency Use Authorization (EUA). This EUA will remain in effect (meaning this test can be used) for the duration of the COVID-19 declaration under Section 564(b)(1) of the Act, 21 U.S.C. section 360bbb-3(b)(1), unless the authorization is terminated or revoked.  Performed at [facility], [address], [place], [place] [address]   Urine Culture     Status: Abnormal   Collection Time: 10/05/23  9:12 PM   Specimen: Urine, Random  Result Value Ref Range Status   Specimen Description URINE, RANDOM  Final   Special Requests   Final    NONE Reflexed from [id number] Performed at [facility], [address] [place], [place] [address]    Culture 70,000 COLONIES/mL YEAST (A)  Final   Report Status 10/06/2023 FINAL  Final  Culture, blood (routine x 2)      Status: None   Collection Time: 10/05/23 11:07 PM   Specimen: BLOOD LEFT HAND  Result Value Ref Range Status   Specimen Description BLOOD LEFT HAND  Final   Special Requests   Final    BOTTLES DRAWN AEROBIC AND ANAEROBIC Blood Culture adequate volume   Culture   Final    NO GROWTH 5 DAYS Performed at [facility], [address], [place], [place] [address]    Report Status 09/28/2023 FINAL  Final  MRSA Next Gen by PCR, Nasal     Status: None   Collection Time: 10/06/23 12:26 PM   Specimen: Nasal Mucosa; Nasal Swab  Result Value Ref Range Status   MRSA by PCR Next Gen NOT DETECTED NOT DETECTED Final    Comment: (NOTE) The GeneXpert MRSA Assay (FDA approved for NASAL specimens only), is one component of a comprehensive MRSA colonization surveillance program. It is not intended to diagnose MRSA infection nor to guide or monitor treatment for MRSA infections. Test performance is not FDA approved in patients less than 38 years old. Performed at [facility], [address], [place], [place] [address]     RADIOLOGY STUDIES/RESULTS: No results found.    LOS: 5 days   Kimberly Penna, MD  [facility]    To contact the attending provider between 7A-7P or the covering provider during after hours 7P-7A, please log into the web site [email or web address] and access using universal [place] password for that web site. If you do not have the password, please call the hospital operator.  10/24/2023, 8:56 AM

## 2023-10-29 NOTE — Progress Notes (Signed)
 Patient in A-fib with RVR with rate in the 120s.  He has remained n.p.o. due to continuously requiring BiPAP for his respiratory failure and not able to take any p.o. meds.  Per review of MAR, last dose of amiodarone  and Eliquis  was 5/10 AM.  Also on midodrine for hypotension and most recent blood pressure 110/74.  Discussed with cardiology and patient has been started on amiodarone  drip and IV heparin .

## 2023-10-29 NOTE — Death Summary Note (Signed)
 DEATH SUMMARY   Patient Details  Name: SOLON BELLMAN MRN: [id number] DOB: 04/03/1946 UVO:[id number], [facility] Admission/Discharge Information   Admit Date:  2023/10/06  Date of Death: Date of Death: 10-11-23  Time of Death: Time of Death: 10-16-1639  Length of Stay: 5   Principle Cause of death: Acute hypoxic respiratory failure  Hospital Diagnoses: Principal Problem:   Severe sepsis (HCC) Active Problems:   Acute respiratory failure with hypoxia (HCC)   Stage 3a chronic kidney disease (CKD) (HCC)   Chronic HFrEF (heart failure with reduced ejection fraction) (HCC)   Chronic pain syndrome   BPH (benign prostatic hyperplasia)   Depression with anxiety   Elevated troponin   Hospital Course: Patient is a 78 y.o.  male with history of HFrEF, PAF, CKD stage IIIa, HTN, BPH, narcotic dependence-presented from SNF for fever/shortness of breath-found to have acute hypoxic respiratory failure secondary to PNA.  Started on BiPAP and admitted to the hospitalist service.   Significant events: 3/31-4/11>> hospitalization for hypoxia-secondary to HFrEF exacerbation with low output state/COVID-19 pneumonia. 4/30-5/6>> hospitalization for stercoral ulceration-bright red blood per rectum-possible aspiration pneumonia. 5/8>> admit to [facility] from SNF-fever/SOB-requiring BiPAP.  Empiric antibiotics-admit to PCU. 5/9>> acute urinary retention-purulent urine obtained when Foley catheter placed. 5/13>> continues to worsen-essentially BiPAP dependent-not tolerating HFNC for more than a few hours-discussed with spouse-comfort care when daughter arrives.   Significant studies: 4/3>> TTE: EF 35-40%. 4/4>> LHC: Normal coronary angiography. 5/3>> colonoscopy: Ulcerated mucosa proximal rectum/rectosigmoid-moderate diverticulosis.  Polyp in ascending colon/sigmoid colon. 5/5>> patchy opacities-left lung-chronic right hemidiaphragm. 5/10>> CT renal stone study: No hydronephrosis/stones.  Interval development of  groundglass opacities/bronchiectasis throughout visualized bilateral lung.   Significant microbiology data: 5/8>> COVID/influenza/RSV PCR: Negative 5/8>> blood culture: No growth 5/8>> urine culture: Pending   Procedures: None   Consults: Pall care  Assessment and Plan: Acute hypoxic respiratory failure-likely secondary to PNA and possible HFrEF exacerbation Unfortunately-no major improvement over the past several days-still requiring mostly BiPAP-though will tolerate being off BiPAP for a few hours-on heated high flow Was on empiric antibiotics/Lasix -unfortunately no significant response-after discussion with family-transition to full comfort measures on 11-Oct-2023.    Severe sepsis-likely secondary to PNA+/-UTI (POA-severe leukocytosis-hypoxic/febrile) Although sepsis physiology has improved some-patient remains very tenuous-very hypoxic No significant clinical improvement over the past several days Was maintained on cefepime /Flagyl  for PNA-fluconazole for funguria (purulent urine on 5/9 when Foley catheter placed).   Due to lack of improvement-after extensive discussion with family-made comfort measures on 2023/10/11.   Acute on chronic HFrEF Volume status stable Was on IV Lasix  throughout this hospitalization but discontinued on 2023/10/11 due to AKI/hypernatremia.   Elevated troponins Secondary to  demand ischemia Recent LHC negative Given his overall clinical situation-was not thought to be a good candidate for aggressive care at this point.   PAF with RVR Developed RVR overnight on 5/13-was started on amiodarone  infusion-since he was not able to take any oral anticoagulation he was on IV heparin    Hypokalemia This was repleted.   Hypernatremia Secondary to very poor oral intake for the past several days-and IV Lasix  IV Lasix  was held on Oct 11, 2023. Due to patient being on BiPAP-ongoing lethargy-his oral intake was extremely poor.   AKI on CKD stage IIIa Creatinine gradually worsening over  the past several days-suspect this is hemodynamically mediated in the setting of ongoing sepsis physiology/pneumonia-and diuretic use Lasix  was held on 2023/10/11.  Due to continued decline in his overall situation-after discussion with family-patient was transition to full comfort measures on 10/11/23.  Acute urinary retention on 5/9 BPH Continue Flomax  Foley catheter placed 5/9-plans were for voiding trial when clinically improved-however he was transition to full comfort measures on 5/13-Foley catheter was kept in place for comfort.     Recent imaging consistent on prior admit-consistent with mild left-sided hydronephrosis This was seen incidentally on CT PE study. Foley catheter in place-CT renal stone study without any major hydronephrosis.   Elevated troponins Secondary to  demand ischemia Recent LHC negative Given his overall clinical situation-do not think he is a candidate for aggressive care at this point.   Dementia Supportive care Maintain delirium precautions   Chronic pain syndrome-narcotic dependence As needed narcotics for now.   Palliative care He remained extremely tenuous throughout this hospitalization-very hypoxemic-requiring almost continuous BiPAP-only able to tolerate being on heated high flow for a few hours.  He was on broad-spectrum antibiotics/IV Lasix .  This MD and the palliative care team had extensive discussion with spouse over multiple days-she understood tenuous situation-poor prognosis-initially wanted to continue with antibiotics and supportive care-but given continued deterioration-more discussions were held-on 5/13-she was agreeable to transition to full comfort measures once her daughter arrived in town.  Once other family members arrived-patient was made full comfort care.  At family's request-to prevent air hunger/suffering-as patient was on chronic Dilaudid -patient was given 1-2 mg IV bolus and started Dilaudid  infusion at  2 Mg an hour.  Since patient was  severely hypoxemic-it was felt that patient would not survive without BiPAP for more than a few hours.  He subsequently passed away on 5/13-with family at bedside.   The results of significant diagnostics from this hospitalization (including imaging, microbiology, ancillary and laboratory) are listed below for reference.   Significant Diagnostic Studies: DG Chest Port 1 View Result Date: 10/08/2023 CLINICAL DATA:  Shortness of breath. EXAM: PORTABLE CHEST 1 VIEW COMPARISON:  Radiographs 10/07/2023 and 10/05/2023. CT 10/02/2023 and 10/07/2023. FINDINGS: 0552 hours. The heart size and mediastinal contours are stable with aortic atherosclerosis. Persistent asymmetric elevation of the right hemidiaphragm with associated asymmetric right basilar atelectasis. Persistent diffusely increased ground-glass opacities compared with baseline, suspicious for superimposed interstitial edema or atypical infection. No evidence of pneumothorax, significant pleural effusion or worsening focal airspace disease. The bones appear unchanged with a thoracolumbar scoliosis. IMPRESSION: Persistent diffusely increased ground-glass opacities compared with baseline, suspicious for superimposed interstitial edema or atypical infection. No significant pleural effusion. Electronically Signed   By: Elmon Hagedorn M.D.   On: 10/08/2023 10:02   CT RENAL STONE STUDY Result Date: 10/07/2023 CLINICAL DATA:  Abdominal/flank pain, stone suspected Recent CT PE study-with left hydronephrosis-now here with severe sepsis-please assess for renal stone/obstruction. EXAM: CT ABDOMEN AND PELVIS WITHOUT CONTRAST TECHNIQUE: Multidetector CT imaging of the abdomen and pelvis was performed following the standard protocol without IV contrast. RADIATION DOSE REDUCTION: This exam was performed according to the departmental dose-optimization program which includes automated exposure control, adjustment of the mA and/or kV according to patient size and/or use of  iterative reconstruction technique. COMPARISON:  Ct abd/pelvis 11/10/20, cxr 10/07/23 FINDINGS: Lower chest: Interval development of ground-glass airspace opacity and bronchiectasis throughout the visualized bilateral lungs with interval worsening of peribronchovascular consolidation of bilateral lower lobes. Atherosclerotic plaque. Cardiac changes suggestive of anemia. Hepatobiliary: No focal liver abnormality. Status post cholecystectomy. No biliary dilatation. Pancreas: No focal lesion. Normal pancreatic contour. No surrounding inflammatory changes. No main pancreatic ductal dilatation. Spleen: Normal in size without focal abnormality. Adrenals/Urinary Tract: No adrenal nodule bilaterally. No nephrolithiasis and no hydronephrosis. No definite  contour-deforming renal mass. No ureterolithiasis or hydroureter. Marked circumferential urinary bladder wall thickening. Foley catheter tip inflated balloon terminate within the urinary bladder lumen. Stomach/Bowel: Stomach is within normal limits. No evidence of bowel wall thickening or dilatation. Colonic diverticulosis. Appendix appears normal. Vascular/Lymphatic: No abdominal aorta or iliac aneurysm. Severe atherosclerotic plaque of the aorta and its branches. No abdominal, pelvic, or inguinal lymphadenopathy. Reproductive: Prostate is unremarkable. Other: No intraperitoneal free fluid. No intraperitoneal free gas. No organized fluid collection. Musculoskeletal: No abdominal wall hernia or abnormality. No suspicious lytic or blastic osseous lesions. No acute displaced fracture. Levoscoliosis of the upper lumbar spine. Multilevel severe degenerative changes of the spine. Severe degenerative changes of the right hip with loss of joint space. IMPRESSION: 1. Interval development of ground-glass airspace opacity and bronchiectasis throughout the visualized bilateral lungs with interval worsening of peribronchovascular consolidation of bilateral lower lobes. 2. Cystitis. 3.   Aortic Atherosclerosis (ICD10-I70.0). 4. Limited evaluation due to motion artifact and noncontrast study. Electronically Signed   By: Morgane  Naveau M.D.   On: 10/07/2023 16:15   DG Chest Port 1V same Day Result Date: 10/07/2023 CLINICAL DATA:  Respiratory distress and shortness of breath. EXAM: PORTABLE CHEST 1 VIEW COMPARISON:  10/05/2023 FINDINGS: Stable cardiomediastinal contours. Aortic atherosclerotic calcifications. Unchanged asymmetric elevation of the right hemidiaphragm. There has been interval worsening aeration to both lungs with progressive diffuse increase interstitial markings and ground-glass opacification throughout both lungs. Atelectasis along the elevated right hemidiaphragm appears increased from prior study. Progressive consolidative type change noted in the left base. Visualized osseous structures are unremarkable. IMPRESSION: 1. Interval worsening aeration to both lungs with progressive diffuse increase interstitial markings and ground-glass opacification throughout both lungs. Findings are compatible with worsening pulmonary edema versus atypical infection. 2. Progressive consolidative type change in the left base. 3. Unchanged asymmetric elevation of the right hemidiaphragm. Electronically Signed   By: Kimberley Penman M.D.   On: 10/07/2023 09:01   DG Chest Port 1 View Result Date: 10/05/2023 CLINICAL DATA:  Shortness of breath. EXAM: PORTABLE CHEST 1 VIEW COMPARISON:  CTA chest dated 10/02/2023. Chest radiograph dated 10/01/2023. FINDINGS: Chronic elevation of the right hemidiaphragm. Stable cardiomediastinal silhouette. Patchy opacities again noted in the left lung. Bibasilar atelectasis. No pneumothorax. No acute osseous abnormality. Gas distended loops of bowel in the right upper quadrant. IMPRESSION: 1. Similar patchy opacities in the left lung. 2. Bibasilar atelectasis. 3. Gas distended loops of bowel in the right upper quadrant. Electronically Signed   By: Mannie Seek M.D.    On: 10/05/2023 17:36   US  RENAL Result Date: 10/02/2023 CLINICAL DATA:  [id number] Hydronephrosis, left [id number] EXAM: RENAL / URINARY TRACT ULTRASOUND COMPLETE COMPARISON:  November 10, 2020 FINDINGS: Right Kidney: Renal measurements: 10.2 x 5.3 x 4.7 cm = volume: 134 mL. Normal echogenicity. No mass. No hydronephrosis or nephrolithiasis. Left Kidney: Renal measurements: 10.8 x 3.8 x 6.1 cm = volume: 151.5 mL. Normal echogenicity. No mass. Mild hydronephrosis. No nephrolithiasis. Bladder: Distended bladder with irregular wall thickening and trabeculation. Echogenic debris layering in the bladder lumen. Other: None. IMPRESSION: 1. Mild left-sided hydronephrosis. 2. Distended bladder with irregular wall thickening and trabeculation, possibly due to either chronic bladder outlet obstruction or neurogenic bladder. Echogenic debris layering in the bladder lumen, may represent blood products or infectious debris. Correlation with urinalysis recommended. Electronically Signed   By: Rance Burrows M.D.   On: 10/02/2023 22:41   CT Angio Chest Pulmonary Embolism (PE) W or WO Contrast Result Date: 10/02/2023 CLINICAL DATA:  Pulmonary embolism EXAM: CT ANGIOGRAPHY CHEST WITH CONTRAST TECHNIQUE: Multidetector CT imaging of the chest was performed using the standard protocol during bolus administration of intravenous contrast. Multiplanar CT image reconstructions and MIPs were obtained to evaluate the vascular anatomy. RADIATION DOSE REDUCTION: This exam was performed according to the departmental dose-optimization program which includes automated exposure control, adjustment of the mA and/or kV according to patient size and/or use of iterative reconstruction technique. CONTRAST:  75mL OMNIPAQUE  IOHEXOL  350 MG/ML SOLN COMPARISON:  None Available. FINDINGS: Cardiovascular: Satisfactory opacification of the pulmonary arteries to the segmental level. No evidence of pulmonary embolism. Normal heart size. No pericardial effusion. Coronary  artery calcifications Mediastinum/Nodes: No enlarged mediastinal, hilar, or axillary lymph nodes. Thyroid gland, trachea, and esophagus demonstrate no significant findings. Lungs/Pleura: Ill-defined infiltrates and consolidative changes of the left upper lobe ill-defined partially consolidating infiltrates and atelectasis of the right upper lobe. Chronic elevation of the right hemidiaphragm. 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 IMPRESSION: *No evidence of pulmonary embolism. *Ill-defined infiltrates and consolidative changes of the left upper lobe and ill-defined partially consolidating infiltrates and atelectasis of the right upper lobe. *Left hydronephrosis, correlate with CT abdomen as clinically needed. 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 Electronically Signed   By: Fredrich Jefferson M.D.   On: 10/02/2023 13:24   DG CHEST PORT 1 VIEW Result Date: 10/01/2023 CLINICAL DATA:  Leukocytosis. EXAM: PORTABLE CHEST 1 VIEW COMPARISON:  Chest radiograph dated 09/06/2023 FINDINGS: Stable eventration of the right hemidiaphragm. There is mild cardiomegaly and mild vascular congestion. No new consolidation, pleural effusion or pneumothorax. Atherosclerotic calcification of the aorta. No acute osseous pathology. IMPRESSION: Mild cardiomegaly and mild vascular congestion. No new consolidation. Electronically Signed   By: Angus Bark M.D.   On: 10/01/2023 14:08    Microbiology: Recent Results (from the past 240 hours)  Culture, blood (routine x 2)     Status: None   Collection Time: 10/05/23  4:50 PM   Specimen: BLOOD  Result Value Ref Range Status   Specimen Description BLOOD RIGHT ANTECUBITAL  Final   Special Requests   Final    BOTTLES DRAWN AEROBIC AND  ANAEROBIC Blood Culture adequate volume   Culture   Final    NO GROWTH 5 DAYS Performed at [facility], [address] [place], [place] [address]    Report Status 10/01/2023 FINAL  Final  Resp panel by RT-PCR (RSV, Flu A&B, Covid) Anterior Nasal Swab     Status: None   Collection Time: 10/05/23  8:17 PM   Specimen: Anterior Nasal Swab  Result Value Ref Range Status   SARS Coronavirus 2 by RT PCR NEGATIVE NEGATIVE Final   Influenza A by PCR NEGATIVE NEGATIVE Final   Influenza B by PCR NEGATIVE NEGATIVE Final    Comment: (NOTE) The Xpert Xpress SARS-CoV-2/FLU/RSV plus assay is intended as an aid in the diagnosis of influenza from Nasopharyngeal swab specimens and should not be used as a sole basis for treatment. Nasal washings and aspirates are unacceptable for Xpert Xpress SARS-CoV-2/FLU/RSV testing.  Fact Sheet for Patients: BloggerCourse.com  Fact Sheet for Healthcare Providers: SeriousBroker.it  This test is not yet approved or cleared by the [place]  FDA and has been authorized for detection and/or diagnosis of SARS-CoV-2 by FDA under an Emergency Use Authorization (EUA). This  EUA will remain in effect (meaning this test can be used) for the duration of the COVID-19 declaration under Section 564(b)(1) of the Act, 21 U.S.C. section 360bbb-3(b)(1), unless the authorization is terminated or revoked.     Resp Syncytial Virus by PCR NEGATIVE NEGATIVE Final    Comment: (NOTE) Fact Sheet for Patients: BloggerCourse.com  Fact Sheet for Healthcare Providers: SeriousBroker.it  This test is not yet approved or cleared by the [place]  FDA and has been authorized for detection and/or diagnosis of SARS-CoV-2 by FDA under an Emergency Use Authorization (EUA). This EUA will remain in effect (meaning this test can be used) for the duration of the COVID-19 declaration  under Section 564(b)(1) of the Act, 21 U.S.C. section 360bbb-3(b)(1), unless the authorization is terminated or revoked.  Performed at [facility], [address] [place], [place] [address]   Urine Culture     Status: Abnormal   Collection Time: 10/05/23  9:12 PM   Specimen: Urine, Random  Result Value Ref Range Status   Specimen Description URINE, RANDOM  Final   Special Requests   Final    NONE Reflexed from [id number] Performed at [facility], [address] [place], [place] [address]    Culture 70,000 COLONIES/mL YEAST (A)  Final   Report Status 10/06/2023 FINAL  Final  Culture, blood (routine x 2)     Status: None   Collection Time: 10/05/23 11:07 PM   Specimen: BLOOD LEFT HAND  Result Value Ref Range Status   Specimen Description BLOOD LEFT HAND  Final   Special Requests   Final    BOTTLES DRAWN AEROBIC AND ANAEROBIC Blood Culture adequate volume   Culture   Final    NO GROWTH 5 DAYS Performed at [facility], [address] [place], [place] [address]    Report Status 10/25/2023 FINAL  Final  MRSA Next Gen by PCR, Nasal     Status: None   Collection Time: 10/06/23 12:26 PM   Specimen: Nasal Mucosa; Nasal Swab  Result Value Ref Range Status   MRSA by PCR Next Gen NOT DETECTED NOT DETECTED Final    Comment: (NOTE) The GeneXpert MRSA Assay (FDA approved for NASAL specimens only), is one component of a comprehensive MRSA colonization surveillance program. It is not intended to diagnose MRSA infection nor to guide or monitor treatment for MRSA infections. Test performance is not FDA approved in patients less than 70 years old. Performed at [facility], [address], [place], [place] [address]     Time spent: 45 minutes  Signed: Kimberly Penna, MD 10/28/2023

## 2023-10-29 DEATH — deceased
